# Patient Record
Sex: Female | Born: 1945 | Race: White | Hispanic: No | Smoking: Former smoker
Health system: Southern US, Community
[De-identification: ages and names within clinical notes are randomized; demographics above are authoritative.]

## PROBLEM LIST (undated history)

## (undated) DIAGNOSIS — Z9889 Other specified postprocedural states: Secondary | ICD-10-CM

## (undated) DIAGNOSIS — I10 Essential (primary) hypertension: Secondary | ICD-10-CM

## (undated) DIAGNOSIS — R519 Headache, unspecified: Secondary | ICD-10-CM

## (undated) DIAGNOSIS — R112 Nausea with vomiting, unspecified: Secondary | ICD-10-CM

## (undated) DIAGNOSIS — E785 Hyperlipidemia, unspecified: Secondary | ICD-10-CM

## (undated) DIAGNOSIS — N3642 Intrinsic sphincter deficiency (ISD): Secondary | ICD-10-CM

## (undated) DIAGNOSIS — K589 Irritable bowel syndrome without diarrhea: Secondary | ICD-10-CM

## (undated) DIAGNOSIS — E041 Nontoxic single thyroid nodule: Secondary | ICD-10-CM

## (undated) DIAGNOSIS — F329 Major depressive disorder, single episode, unspecified: Secondary | ICD-10-CM

## (undated) DIAGNOSIS — F32A Depression, unspecified: Secondary | ICD-10-CM

## (undated) DIAGNOSIS — F419 Anxiety disorder, unspecified: Secondary | ICD-10-CM

## (undated) DIAGNOSIS — M1990 Unspecified osteoarthritis, unspecified site: Secondary | ICD-10-CM

## (undated) DIAGNOSIS — R32 Unspecified urinary incontinence: Secondary | ICD-10-CM

## (undated) DIAGNOSIS — J189 Pneumonia, unspecified organism: Secondary | ICD-10-CM

## (undated) DIAGNOSIS — N3020 Other chronic cystitis without hematuria: Secondary | ICD-10-CM

## (undated) DIAGNOSIS — R51 Headache: Secondary | ICD-10-CM

## (undated) DIAGNOSIS — K648 Other hemorrhoids: Secondary | ICD-10-CM

## (undated) DIAGNOSIS — E559 Vitamin D deficiency, unspecified: Secondary | ICD-10-CM

## (undated) DIAGNOSIS — K644 Residual hemorrhoidal skin tags: Secondary | ICD-10-CM

## (undated) DIAGNOSIS — M8580 Other specified disorders of bone density and structure, unspecified site: Secondary | ICD-10-CM

## (undated) DIAGNOSIS — D510 Vitamin B12 deficiency anemia due to intrinsic factor deficiency: Secondary | ICD-10-CM

## (undated) DIAGNOSIS — K219 Gastro-esophageal reflux disease without esophagitis: Secondary | ICD-10-CM

## (undated) DIAGNOSIS — R011 Cardiac murmur, unspecified: Secondary | ICD-10-CM

## (undated) DIAGNOSIS — Z8719 Personal history of other diseases of the digestive system: Secondary | ICD-10-CM

## (undated) DIAGNOSIS — M5030 Other cervical disc degeneration, unspecified cervical region: Secondary | ICD-10-CM

## (undated) DIAGNOSIS — I872 Venous insufficiency (chronic) (peripheral): Secondary | ICD-10-CM

## (undated) DIAGNOSIS — M797 Fibromyalgia: Secondary | ICD-10-CM

## (undated) HISTORY — PX: DILATION AND CURETTAGE OF UTERUS: SHX78

## (undated) HISTORY — DX: Essential (primary) hypertension: I10

## (undated) HISTORY — DX: Nontoxic single thyroid nodule: E04.1

## (undated) HISTORY — DX: Cardiac murmur, unspecified: R01.1

## (undated) HISTORY — PX: CARDIAC CATHETERIZATION: SHX172

## (undated) HISTORY — DX: Other cervical disc degeneration, unspecified cervical region: M50.30

## (undated) HISTORY — DX: Vitamin D deficiency, unspecified: E55.9

## (undated) HISTORY — DX: Vitamin B12 deficiency anemia due to intrinsic factor deficiency: D51.0

## (undated) HISTORY — DX: Venous insufficiency (chronic) (peripheral): I87.2

## (undated) HISTORY — PX: TUBAL LIGATION: SHX77

## (undated) HISTORY — DX: Irritable bowel syndrome, unspecified: K58.9

## (undated) HISTORY — DX: Pneumonia, unspecified organism: J18.9

---

## 1982-10-04 HISTORY — PX: BILATERAL SALPINGOOPHORECTOMY: SHX1223

## 1982-10-04 HISTORY — PX: APPENDECTOMY: SHX54

## 1984-10-04 HISTORY — PX: TOTAL ABDOMINAL HYSTERECTOMY: SHX209

## 1997-10-04 HISTORY — PX: ANTERIOR CERVICAL DECOMP/DISCECTOMY FUSION: SHX1161

## 1998-07-31 ENCOUNTER — Emergency Department (HOSPITAL_COMMUNITY): Admission: EM | Admit: 1998-07-31 | Discharge: 1998-08-01 | Payer: Self-pay | Admitting: Emergency Medicine

## 2001-03-08 ENCOUNTER — Encounter: Payer: Self-pay | Admitting: Internal Medicine

## 2001-03-08 ENCOUNTER — Ambulatory Visit (HOSPITAL_COMMUNITY): Admission: RE | Admit: 2001-03-08 | Discharge: 2001-03-08 | Payer: Self-pay | Admitting: Internal Medicine

## 2002-05-18 ENCOUNTER — Ambulatory Visit (HOSPITAL_BASED_OUTPATIENT_CLINIC_OR_DEPARTMENT_OTHER): Admission: RE | Admit: 2002-05-18 | Discharge: 2002-05-18 | Payer: Self-pay | Admitting: *Deleted

## 2002-05-18 ENCOUNTER — Encounter (INDEPENDENT_AMBULATORY_CARE_PROVIDER_SITE_OTHER): Payer: Self-pay | Admitting: *Deleted

## 2002-05-18 HISTORY — PX: OTHER SURGICAL HISTORY: SHX169

## 2002-10-03 ENCOUNTER — Ambulatory Visit (HOSPITAL_COMMUNITY): Admission: RE | Admit: 2002-10-03 | Discharge: 2002-10-03 | Payer: Self-pay | Admitting: Cardiology

## 2002-10-03 ENCOUNTER — Encounter: Payer: Self-pay | Admitting: Cardiology

## 2002-11-09 ENCOUNTER — Ambulatory Visit (HOSPITAL_COMMUNITY): Admission: RE | Admit: 2002-11-09 | Discharge: 2002-11-09 | Payer: Self-pay | Admitting: Pulmonary Disease

## 2002-11-09 ENCOUNTER — Encounter: Payer: Self-pay | Admitting: Pulmonary Disease

## 2003-01-16 ENCOUNTER — Encounter: Payer: Self-pay | Admitting: Emergency Medicine

## 2003-01-16 ENCOUNTER — Emergency Department (HOSPITAL_COMMUNITY): Admission: EM | Admit: 2003-01-16 | Discharge: 2003-01-17 | Payer: Self-pay | Admitting: Emergency Medicine

## 2003-09-04 ENCOUNTER — Encounter: Admission: RE | Admit: 2003-09-04 | Discharge: 2003-09-04 | Payer: Self-pay | Admitting: Obstetrics and Gynecology

## 2004-04-29 ENCOUNTER — Inpatient Hospital Stay (HOSPITAL_COMMUNITY): Admission: RE | Admit: 2004-04-29 | Discharge: 2004-04-30 | Payer: Self-pay | Admitting: Obstetrics and Gynecology

## 2004-04-29 HISTORY — PX: ANTERIOR AND POSTERIOR VAGINAL REPAIR: SUR5

## 2004-08-17 ENCOUNTER — Ambulatory Visit: Payer: Self-pay | Admitting: Pulmonary Disease

## 2004-08-17 ENCOUNTER — Ambulatory Visit: Payer: Self-pay | Admitting: Internal Medicine

## 2004-08-24 ENCOUNTER — Ambulatory Visit (HOSPITAL_COMMUNITY): Admission: RE | Admit: 2004-08-24 | Discharge: 2004-08-24 | Payer: Self-pay | Admitting: Obstetrics and Gynecology

## 2004-10-12 ENCOUNTER — Ambulatory Visit: Payer: Self-pay | Admitting: Pulmonary Disease

## 2005-06-08 ENCOUNTER — Ambulatory Visit: Payer: Self-pay | Admitting: Pulmonary Disease

## 2005-06-16 ENCOUNTER — Ambulatory Visit: Payer: Self-pay | Admitting: Pulmonary Disease

## 2005-08-04 ENCOUNTER — Ambulatory Visit: Payer: Self-pay | Admitting: Pulmonary Disease

## 2005-10-21 ENCOUNTER — Ambulatory Visit: Payer: Self-pay | Admitting: Pulmonary Disease

## 2005-10-28 ENCOUNTER — Ambulatory Visit: Payer: Self-pay | Admitting: Adult Health

## 2006-02-07 ENCOUNTER — Ambulatory Visit: Payer: Self-pay | Admitting: Pulmonary Disease

## 2006-02-08 ENCOUNTER — Ambulatory Visit (HOSPITAL_COMMUNITY): Admission: RE | Admit: 2006-02-08 | Discharge: 2006-02-08 | Payer: Self-pay | Admitting: Pulmonary Disease

## 2006-02-24 ENCOUNTER — Ambulatory Visit: Payer: Self-pay | Admitting: Cardiology

## 2006-03-02 ENCOUNTER — Ambulatory Visit: Payer: Self-pay | Admitting: Internal Medicine

## 2006-03-04 ENCOUNTER — Ambulatory Visit: Payer: Self-pay | Admitting: Internal Medicine

## 2006-03-04 ENCOUNTER — Encounter (INDEPENDENT_AMBULATORY_CARE_PROVIDER_SITE_OTHER): Payer: Self-pay | Admitting: *Deleted

## 2006-03-04 LAB — HM COLONOSCOPY

## 2006-06-28 ENCOUNTER — Ambulatory Visit: Payer: Self-pay | Admitting: Pulmonary Disease

## 2006-06-28 ENCOUNTER — Ambulatory Visit (HOSPITAL_COMMUNITY): Admission: RE | Admit: 2006-06-28 | Discharge: 2006-06-28 | Payer: Self-pay | Admitting: Pulmonary Disease

## 2006-07-19 ENCOUNTER — Ambulatory Visit: Payer: Self-pay | Admitting: Pulmonary Disease

## 2006-12-20 ENCOUNTER — Ambulatory Visit: Payer: Self-pay | Admitting: Pulmonary Disease

## 2006-12-26 ENCOUNTER — Ambulatory Visit: Payer: Self-pay | Admitting: Pulmonary Disease

## 2006-12-26 LAB — CONVERTED CEMR LAB
AST: 25 units/L (ref 0–37)
Albumin: 3.8 g/dL (ref 3.5–5.2)
Basophils Relative: 0.3 % (ref 0.0–1.0)
Bilirubin, Direct: 0.1 mg/dL (ref 0.0–0.3)
Chloride: 106 meq/L (ref 96–112)
HCT: 39 % (ref 36.0–46.0)
Hemoglobin: 13.5 g/dL (ref 12.0–15.0)
Lymphocytes Relative: 26.4 % (ref 12.0–46.0)
Monocytes Absolute: 0.7 10*3/uL (ref 0.2–0.7)
Neutro Abs: 4.2 10*3/uL (ref 1.4–7.7)
Platelets: 310 10*3/uL (ref 150–400)
RBC: 4.38 M/uL (ref 3.87–5.11)
RDW: 12.5 % (ref 11.5–14.6)
Total Bilirubin: 0.6 mg/dL (ref 0.3–1.2)

## 2007-07-05 ENCOUNTER — Ambulatory Visit: Payer: Self-pay | Admitting: Pulmonary Disease

## 2007-08-07 ENCOUNTER — Encounter: Payer: Self-pay | Admitting: Pulmonary Disease

## 2007-08-14 ENCOUNTER — Ambulatory Visit: Payer: Self-pay | Admitting: Pulmonary Disease

## 2007-09-21 ENCOUNTER — Ambulatory Visit: Payer: Self-pay | Admitting: Pulmonary Disease

## 2007-11-01 ENCOUNTER — Ambulatory Visit: Payer: Self-pay | Admitting: Pulmonary Disease

## 2007-11-04 DIAGNOSIS — I10 Essential (primary) hypertension: Secondary | ICD-10-CM | POA: Insufficient documentation

## 2007-11-04 DIAGNOSIS — K2970 Gastritis, unspecified, without bleeding: Secondary | ICD-10-CM | POA: Insufficient documentation

## 2007-11-04 DIAGNOSIS — K589 Irritable bowel syndrome without diarrhea: Secondary | ICD-10-CM | POA: Insufficient documentation

## 2007-11-04 DIAGNOSIS — R0789 Other chest pain: Secondary | ICD-10-CM | POA: Insufficient documentation

## 2007-11-04 DIAGNOSIS — IMO0001 Reserved for inherently not codable concepts without codable children: Secondary | ICD-10-CM | POA: Insufficient documentation

## 2007-11-04 DIAGNOSIS — M542 Cervicalgia: Secondary | ICD-10-CM

## 2007-11-04 DIAGNOSIS — Z87898 Personal history of other specified conditions: Secondary | ICD-10-CM

## 2007-11-04 DIAGNOSIS — F330 Major depressive disorder, recurrent, mild: Secondary | ICD-10-CM | POA: Insufficient documentation

## 2007-11-04 DIAGNOSIS — K2990 Gastroduodenitis, unspecified, without bleeding: Secondary | ICD-10-CM

## 2007-11-04 DIAGNOSIS — F418 Other specified anxiety disorders: Secondary | ICD-10-CM

## 2007-11-04 DIAGNOSIS — E785 Hyperlipidemia, unspecified: Secondary | ICD-10-CM

## 2007-11-04 HISTORY — DX: Essential (primary) hypertension: I10

## 2007-11-04 HISTORY — DX: Other specified anxiety disorders: F41.8

## 2007-11-04 LAB — CONVERTED CEMR LAB
AST: 16 units/L (ref 0–37)
Alkaline Phosphatase: 77 units/L (ref 39–117)
BUN: 15 mg/dL (ref 6–23)
Basophils Relative: 0.6 % (ref 0.0–1.0)
CO2: 31 meq/L (ref 19–32)
Cholesterol: 156 mg/dL (ref 0–200)
Creatinine, Ser: 0.8 mg/dL (ref 0.4–1.2)
Eosinophils Relative: 1.4 % (ref 0.0–5.0)
HCT: 36.7 % (ref 36.0–46.0)
HDL: 38.7 mg/dL — ABNORMAL LOW (ref >39.0)
Hemoglobin: 12.1 g/dL (ref 12.0–15.0)
Lymphocytes Relative: 34 % (ref 12.0–46.0)
MCHC: 32.9 g/dL (ref 30.0–36.0)
Monocytes Absolute: 0.4 10*3/uL (ref 0.2–0.7)
Monocytes Relative: 6.7 % (ref 3.0–11.0)
Neutrophils Relative %: 57.3 % (ref 43.0–77.0)
Platelets: 312 10*3/uL (ref 150–400)
Sodium: 141 meq/L (ref 135–145)
Total CHOL/HDL Ratio: 4
VLDL: 27 mg/dL (ref 0–40)
WBC: 6.6 10*3/uL (ref 4.5–10.5)

## 2007-11-21 ENCOUNTER — Telehealth (INDEPENDENT_AMBULATORY_CARE_PROVIDER_SITE_OTHER): Payer: Self-pay | Admitting: *Deleted

## 2007-12-25 ENCOUNTER — Telehealth (INDEPENDENT_AMBULATORY_CARE_PROVIDER_SITE_OTHER): Payer: Self-pay | Admitting: *Deleted

## 2008-01-02 ENCOUNTER — Ambulatory Visit: Payer: Self-pay | Admitting: Pulmonary Disease

## 2008-02-07 ENCOUNTER — Ambulatory Visit: Payer: Self-pay | Admitting: Pulmonary Disease

## 2008-02-13 ENCOUNTER — Encounter: Payer: Self-pay | Admitting: Pulmonary Disease

## 2008-03-29 ENCOUNTER — Ambulatory Visit: Payer: Self-pay | Admitting: Pulmonary Disease

## 2008-06-04 ENCOUNTER — Ambulatory Visit: Payer: Self-pay | Admitting: Pulmonary Disease

## 2008-07-11 ENCOUNTER — Telehealth: Payer: Self-pay | Admitting: Adult Health

## 2008-07-16 ENCOUNTER — Ambulatory Visit: Payer: Self-pay | Admitting: Pulmonary Disease

## 2008-07-20 DIAGNOSIS — E559 Vitamin D deficiency, unspecified: Secondary | ICD-10-CM | POA: Insufficient documentation

## 2008-07-20 HISTORY — DX: Vitamin D deficiency, unspecified: E55.9

## 2008-07-23 ENCOUNTER — Ambulatory Visit: Payer: Self-pay | Admitting: Pulmonary Disease

## 2008-07-23 LAB — CONVERTED CEMR LAB
ALT: 18 units/L (ref 0–35)
BUN: 16 mg/dL (ref 6–23)
Basophils Relative: 0.6 % (ref 0.0–3.0)
Bilirubin, Direct: 0.2 mg/dL (ref 0.0–0.3)
CO2: 30 meq/L (ref 19–32)
Chloride: 105 meq/L (ref 96–112)
Cholesterol: 128 mg/dL (ref 0–200)
Creatinine, Ser: 0.8 mg/dL (ref 0.4–1.2)
Eosinophils Absolute: 0.1 10*3/uL (ref 0.0–0.7)
Eosinophils Relative: 1.4 % (ref 0.0–5.0)
GFR calc Af Amer: 94 mL/min
HCT: 35.3 % — ABNORMAL LOW (ref 36.0–46.0)
Hemoglobin: 12.3 g/dL (ref 12.0–15.0)
Lymphocytes Relative: 33.5 % (ref 12.0–46.0)
MCHC: 34.9 g/dL (ref 30.0–36.0)
MCV: 88.8 fL (ref 78.0–100.0)
Monocytes Absolute: 0.4 10*3/uL (ref 0.1–1.0)
Neutro Abs: 3.8 10*3/uL (ref 1.4–7.7)
Neutrophils Relative %: 57.9 % (ref 43.0–77.0)
Potassium: 4.3 meq/L (ref 3.5–5.1)
RBC: 3.98 M/uL (ref 3.87–5.11)
RDW: 12.8 % (ref 11.5–14.6)
Total CHOL/HDL Ratio: 3.7

## 2008-07-25 ENCOUNTER — Telehealth (INDEPENDENT_AMBULATORY_CARE_PROVIDER_SITE_OTHER): Payer: Self-pay | Admitting: *Deleted

## 2008-08-08 ENCOUNTER — Encounter: Payer: Self-pay | Admitting: Pulmonary Disease

## 2008-10-11 ENCOUNTER — Ambulatory Visit: Payer: Self-pay | Admitting: Internal Medicine

## 2008-10-11 ENCOUNTER — Ambulatory Visit: Payer: Self-pay | Admitting: Pulmonary Disease

## 2009-01-09 ENCOUNTER — Ambulatory Visit: Payer: Self-pay | Admitting: Pulmonary Disease

## 2009-04-04 ENCOUNTER — Ambulatory Visit: Payer: Self-pay | Admitting: Internal Medicine

## 2009-07-24 ENCOUNTER — Telehealth: Payer: Self-pay | Admitting: Pulmonary Disease

## 2009-07-29 ENCOUNTER — Telehealth: Payer: Self-pay | Admitting: Pulmonary Disease

## 2009-07-30 ENCOUNTER — Ambulatory Visit: Payer: Self-pay | Admitting: Pulmonary Disease

## 2009-07-31 ENCOUNTER — Ambulatory Visit: Payer: Self-pay | Admitting: Pulmonary Disease

## 2009-07-31 LAB — CONVERTED CEMR LAB
ALT: 18 units/L (ref 0–35)
AST: 15 units/L (ref 0–37)
Alkaline Phosphatase: 70 units/L (ref 39–117)
BUN: 30 mg/dL — ABNORMAL HIGH (ref 6–23)
Basophils Absolute: 0.1 10*3/uL (ref 0.0–0.1)
Basophils Relative: 1.2 % (ref 0.0–3.0)
Bilirubin, Direct: 0 mg/dL (ref 0.0–0.3)
CO2: 32 meq/L (ref 19–32)
Calcium: 9.5 mg/dL (ref 8.4–10.5)
Chloride: 106 meq/L (ref 96–112)
Eosinophils Relative: 2.5 % (ref 0.0–5.0)
Glucose, Bld: 89 mg/dL (ref 70–99)
Hemoglobin: 12.3 g/dL (ref 12.0–15.0)
LDL Cholesterol: 70 mg/dL (ref 0–99)
Lymphs Abs: 2.6 10*3/uL (ref 0.7–4.0)
MCHC: 34.7 g/dL (ref 30.0–36.0)
Potassium: 4.2 meq/L (ref 3.5–5.1)
RDW: 12.5 % (ref 11.5–14.6)
Sodium: 145 meq/L (ref 135–145)
Total Bilirubin: 0.6 mg/dL (ref 0.3–1.2)
Total Protein: 7 g/dL (ref 6.0–8.3)
WBC: 6.9 10*3/uL (ref 4.5–10.5)

## 2009-12-05 ENCOUNTER — Telehealth (INDEPENDENT_AMBULATORY_CARE_PROVIDER_SITE_OTHER): Payer: Self-pay | Admitting: *Deleted

## 2010-01-02 ENCOUNTER — Ambulatory Visit: Payer: Self-pay | Admitting: Pulmonary Disease

## 2010-03-19 ENCOUNTER — Encounter: Payer: Self-pay | Admitting: Pulmonary Disease

## 2010-03-25 ENCOUNTER — Telehealth: Payer: Self-pay | Admitting: Pulmonary Disease

## 2010-03-27 ENCOUNTER — Encounter: Payer: Self-pay | Admitting: Pulmonary Disease

## 2010-05-28 ENCOUNTER — Telehealth: Payer: Self-pay | Admitting: Pulmonary Disease

## 2010-05-28 ENCOUNTER — Encounter: Payer: Self-pay | Admitting: Pulmonary Disease

## 2010-05-29 ENCOUNTER — Encounter: Payer: Self-pay | Admitting: Pulmonary Disease

## 2010-06-18 ENCOUNTER — Telehealth (INDEPENDENT_AMBULATORY_CARE_PROVIDER_SITE_OTHER): Payer: Self-pay | Admitting: *Deleted

## 2010-06-19 ENCOUNTER — Encounter: Payer: Self-pay | Admitting: Pulmonary Disease

## 2010-06-23 ENCOUNTER — Ambulatory Visit: Payer: Self-pay | Admitting: Pulmonary Disease

## 2010-08-07 ENCOUNTER — Telehealth: Payer: Self-pay | Admitting: Pulmonary Disease

## 2010-08-20 ENCOUNTER — Telehealth: Payer: Self-pay | Admitting: Pulmonary Disease

## 2010-10-01 ENCOUNTER — Telehealth: Payer: Self-pay | Admitting: Pulmonary Disease

## 2010-10-12 ENCOUNTER — Ambulatory Visit: Admit: 2010-10-12 | Payer: Self-pay | Admitting: Pulmonary Disease

## 2010-10-19 ENCOUNTER — Telehealth (INDEPENDENT_AMBULATORY_CARE_PROVIDER_SITE_OTHER): Payer: Self-pay | Admitting: *Deleted

## 2010-10-26 ENCOUNTER — Telehealth (INDEPENDENT_AMBULATORY_CARE_PROVIDER_SITE_OTHER): Payer: Self-pay | Admitting: *Deleted

## 2010-11-02 ENCOUNTER — Telehealth: Payer: Self-pay | Admitting: Pulmonary Disease

## 2010-11-05 NOTE — Letter (Signed)
[facility]   Imported By: Sherian Rein 07/07/2010 12:08:10  _____________________________________________________________________  External Attachment:    Type:   Image     Comment:   External Document

## 2010-11-05 NOTE — Progress Notes (Signed)
Summary: alprazolam rx  Phone Note Call from Patient Call back at Work Phone [phone]   Caller: Patient Call For: nadel Reason for Call: Refill Medication Summary of Call: Patient needing refill--alprazolam 0.5mg ---[facility] # [phone] Initial call taken by: Lehman Prom,  October 19, 2010 9:12 AM  Follow-up for Phone Call        Rx last called in on 07/01/10 #100 x 0 with instructions pt with need ov for further refills of this med.  Last OV with TP 06/23/10 Last OV with SN 07/31/2009 Pending OV with SN 11/16/10  Dr. Kriste Basque, pls advise if rx ok.  Thanks! Follow-up by: Gweneth Dimitri RN,  October 19, 2010 10:34 AM  Additional Follow-up for Phone Call Additional follow up Details #1::        per SN----ok to refill alprazolam   #100   1/2 to 1 tablet by mouth three times a day as needed for nerves with no refills---keep appt with SN on 2-13 for refills of meds. thanks Randell Loop CMA  October 19, 2010 10:50 AM    Rx called into [facility] in [place].  Pt aware of this and reminded to pls keep appt for 2/13 for addional rxs.  She verbalized understanding of this. Additional Follow-up by: Gweneth Dimitri RN,  October 19, 2010 11:20 AM    Prescriptions: ALPRAZOLAM 0.5 MG  TABS (ALPRAZOLAM) 1/2 to 1 tab by mouth three times a day as needed for nreves...  #100 x 0   Entered by:   Gweneth Dimitri RN   Authorized by:   Michele Mcalpine MD   Signed by:   Gweneth Dimitri RN on 10/19/2010   Method used:   Telephoned to ...       [facility] (retail)       [address]       [place], [place]    [place]       Ph: [phone]       Fax: [phone]   RxID:   [id number]

## 2010-11-05 NOTE — Progress Notes (Signed)
Summary: b-12  Phone Note Call from Patient Call back at Home Phone [phone]   Caller: Patient Call For: Americus Perkey Summary of Call: pt needs rx written to take to the pharmacy to [facility] just needs to written like its being filled with directions and signed with refills pt request to give rx to her daughter chantel   Initial call taken by: Lacinda Axon,  March 25, 2010 2:56 PM  Follow-up for Phone Call        SN---do you want her to cont the b12 injections every month??  if so i can print out the rx for this for her to [facility] Randell Loop CMA  March 25, 2010 2:59 PM   Additional Follow-up for Phone Call Additional follow up Details #1::        rx has been printed out and given to chan--pts daughter per pts request Randell Loop [facility]  March 25, 2010 4:44 PM     New/Updated Medications: CYANOCOBALAMIN 1000 MCG/ML SOLN (CYANOCOBALAMIN) subcutaneous once monthly Prescriptions: CYANOCOBALAMIN 1000 MCG/ML SOLN (CYANOCOBALAMIN) subcutaneous once monthly  #1 vial x 6   Entered by:   Randell Loop CMA   Authorized by:   Michele Mcalpine MD   Signed by:   Randell Loop CMA on 03/25/2010   Method used:   Print then Give to Patient   RxID:   [id number]   Appended Document: [facility]ap [facility] updated     Clinical Lists Changes  Observations: Added new observation of TD BOOSTER: Tdap (03/19/2010 10:55)       Immunization History:  Tetanus/Td Immunization History:    Tetanus/Td:  tdap (03/19/2010)

## 2010-11-05 NOTE — Letter (Signed)
[facility]   Imported By: Sherian Rein 06/15/2010 11:54:28  _____________________________________________________________________  External Attachment:    Type:   Image     Comment:   External Document

## 2010-11-05 NOTE — Letter (Signed)
Summary: External Correspondence  External Correspondence   Imported By: Valinda Hoar 05/28/2010 10:27:34  _____________________________________________________________________  External Attachment:    Type:   Image     Comment:   External Document

## 2010-11-05 NOTE — Progress Notes (Signed)
Summary: REFILL  Phone Note Call from Patient Call back at Home Phone [phone]   Caller: Patient Call For: Damaria Stofko Summary of Call: GENERIC [facility] NEEDS REFILLS TO LAST UNTIL APPT IN JAN SENT TO [facility] IN [place] Initial call taken by: Lacinda Axon,  August 20, 2010 10:19 AM  Follow-up for Phone Call        rx for the sertraline has been sent to pts pharmacy per her request and pt has appt in jan with SN Randell Loop CMA  August 20, 2010 10:47 AM

## 2010-11-05 NOTE — Assessment & Plan Note (Signed)
Summary: Acute NP office visit - sinus inf   CC:  sore throat, hoarseness, sinus pressure/congestion, PND and prod cough with clear mucus, and chills/sweats x1week.  History of Present Illness: This is a pleasant, 65 year old, white female patient has a known history hypertension, hyperlipidemia, pernicious anemia.     ~  Oct09:  she has mult somatic complaints and is under alot of stress w/ recent separation... 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  she tried Lyrica for her fibromyalgia and noted that 2/d was too much and made her "loopey" so she decreased it back to 1/d... she also notes a rash under her breast and we Rx'd w/ Lotrisone...   ~  July 31, 2009:  she reports that she was laid off from [employer] where she worked for yrs- and now has no insurance... we discussed mult changes to save $$$... she has done a fabulous job w/ diet + exercise & lost 36# this yr down to 168# today...   June 23, 2010--Presents for an acute office visit. Complains of sore throat, hoarseness, sinus pressure/congestion, PND and prod cough with clear mucus, chills/sweats x1week. Took otc cold meds.  She has been laid off work 2009 and does not have insurance. Currently working in daycare as substitue. [facility], given some throat gargle. Symptoms are no better. Denies chest pain, dyspnea, orthopnea, hemoptysis, fever, n/v/d, edema, headache,recent travel or antibiotics.  Quit smoking 11/10.        Preventive Screening-Counseling & Management  Alcohol-Tobacco     Smoking Status: quit  Medications Prior to Update: 1)  Atenolol 25 Mg Tabs (Atenolol) .... Take 1 Tab By Mouth Once Daily.Marland KitchenMarland Kitchen 2)  Losartan Potassium-Hctz 50-12.5 Mg Tabs (Losartan Potassium-Hctz) .... Take 1 Tab By Mouth Once Daily.Marland KitchenMarland Kitchen 3)  Pravastatin Sodium 40 Mg Tabs (Pravastatin Sodium) .... Take 1 Tab By Mouth At  Bedtime.Marland KitchenMarland Kitchen 4)  Prilosec 20 Mg  Cpdr (Omeprazole) .Marland Kitchen.. 1 Tab Once Daily - Taken 30 Min Before A Meal... 5)  Tramadol Hcl 50 Mg  Tabs (Tramadol Hcl) .Marland Kitchen.. 1 Tab By Mouth Q6h As Needed For Pain... 6)  Sertraline Hcl 100 Mg Tabs (Sertraline Hcl) .... Take 1 & 1/2 Tabs By Mouth Once Daily.Marland KitchenMarland Kitchen 7)  Alprazolam 0.5 Mg  Tabs (Alprazolam) .... 1/2 To 1 Tab By Mouth Three Times A Day As Needed For Nreves... 8)  Vitamin D 1000 Unit Tabs (Cholecalciferol) .... Take 1 Tab By Mouth Once Daily.Marland KitchenMarland Kitchen 9)  Cyanocobalamin 1000 Mcg/ml Soln (Cyanocobalamin) .Marland Kitchen.. Subcutaneous Once Monthly  Current Medications (verified): 1)  Atenolol 25 Mg Tabs (Atenolol) .... Take 1 Tab By Mouth Once Daily.Marland KitchenMarland Kitchen 2)  Losartan Potassium-Hctz 50-12.5 Mg Tabs (Losartan Potassium-Hctz) .... Take 1 Tab By Mouth Once Daily.Marland KitchenMarland Kitchen 3)  Pravastatin Sodium 40 Mg Tabs (Pravastatin Sodium) .... Take 1 Tab By Mouth At Bedtime.Marland KitchenMarland Kitchen 4)  Prilosec 20 Mg  Cpdr (Omeprazole) .Marland Kitchen.. 1 Tab Once Daily - Taken 30 Min Before A Meal... 5)  Tramadol Hcl 50 Mg  Tabs (Tramadol Hcl) .Marland Kitchen.. 1 Tab By Mouth Q6h As Needed For Pain... 6)  Sertraline Hcl 100 Mg Tabs (Sertraline Hcl) .... Take 1 & 1/2 Tabs By Mouth Once Daily.Marland KitchenMarland Kitchen 7)  Alprazolam 0.5 Mg  Tabs (Alprazolam) .... 1/2 To 1 Tab By Mouth Three Times A Day As Needed For Nreves... 8)  Vitamin D 1000 Unit Tabs (Cholecalciferol) .... Take 1 Tab By Mouth Once Daily.Marland KitchenMarland Kitchen 9)  Cyanocobalamin 1000 Mcg/ml Soln (Cyanocobalamin) .Marland Kitchen.. Subcutaneous Once Monthly  Allergies (verified): 1)  ! Sulfa 2)  ! Morphine  Past History:  Past Medical History: Last updated: 07/31/2009 CIGARETTE SMOKER (ICD-305.1) HYPERTENSION (ICD-401.9) Hx of CHEST PAIN, ATYPICAL (ICD-786.59) HYPERLIPIDEMIA (ICD-272.4) Hx of GASTRITIS (ICD-535.50) IRRITABLE BOWEL SYNDROME (ICD-564.1) Hx of NECK PAIN (ICD-723.1) FIBROMYALGIA (ICD-729.1) VITAMIN D DEFICIENCY (ICD-268.9) MIGRAINES, HX OF (ICD-V13.8) ANXIETY (ICD-300.00) PERNICIOUS ANEMIA  (ICD-281.0)  Past Surgical History: Last updated: 07/31/2009 S/P Neck Surgery by DrStern in 1999 S/P TAH & Bladder suspension 7/05  Family History: Last updated: Aug 06, 2008 Father died age 17 w/ lung cancer, CAD w/ MI, smoker Mother died age 10 w/ myelodysplaia, HBP, CHF 4 Siblings: 2 Bro & 2 Sis-  one sis died MVA, other sis has fibromyalgia, bro HBP & anemia  Social History: Last updated: 06/23/2010 former smoker, quit 08-09-09 - x63yrs, <1ppd occ alcohol divorced 1 child, works for [employer] retired, 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  Risk Factors: Smoking Status: quit (06/23/2010)  Social History: former smoker, quit 08-09-09 - x56yrs, <1ppd occ alcohol divorced 1 child, works for [employer] retired, 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Smoking Status:  quit  Review of Systems      See HPI  Vital Signs:  Patient profile:   65 year old female Height:      66 inches Weight:      192.38 pounds BMI:     31.16 O2 Sat:      93 % on Room air Temp:     98.1 degrees F oral Pulse rate:   79 / minute BP sitting:   120 / 60  (right arm) Cuff size:   regular  Vitals Entered By: Boone Master CNA/MA (June 23, 2010 9:47 AM)  O2 Flow:  Room air CC: sore throat, hoarseness, sinus pressure/congestion, PND and prod cough with clear mucus, chills/sweats x1week Is Patient Diabetic? No Comments Medications reviewed with patient Daytime contact number verified with patient. Boone Master CNA/MA  June 23, 2010 9:46 AM    Physical Exam  Additional Exam:  WD, Overweight, 65 y/o WF in NAD... GENERAL:  Alert & oriented; pleasant & cooperative... HEENT:  [place]/AT, EOM-wnl, PERRLA, EACs-clear, TMs-wnl, NOSE-clear, THROAT-clear & wnl. NECK:  Scar of prev surg w/ fairROM; no JVD; normal carotid impulses w/o bruits; no thyromegaly or nodules palpated; no lymphadenopathy. CHEST:  Clear to P & A; without wheezes/ rales/ or rhonchi. HEART:  Regular Rhythm; without murmurs/ rubs/ or  gallops. ABDOMEN:  Soft & nontender; normal bowel sounds; no organomegaly or masses detected. EXT: without deformities, mild arthritic changes; no varicose veins/ +venous insuffic/ no edema. NEURO:  CN's intact; motor testing normal; sensory testing normal; gait normal & balance OK. DERM:  No lesions noted; intertriginous rash under breast c/w skin fungus...     Impression & Recommendations:  Problem # 1:  UPPER RESPIRATORY INFECTION, ACUTE (ICD-465.9)  Spectracef 400mg  two times a day until sample gone Nasonex 2 puffs two times a day until gone Saline nasal rinses as needed  Change Losartan to Lisinopril 10/12.5 once daily --finish losartan first.  Please contact office for sooner follow up if symptoms do not improve or worsen   Orders: Est. Patient Level I (52841)  Medications Added to Medication List This Visit: 1)  Prinzide 10-12.5 Mg Tabs (Lisinopril-hydrochlorothiazide) .Marland Kitchen.. 1 by mouth once daily  Complete Medication List: 1)  Atenolol 25 Mg Tabs (Atenolol) .... Take 1 tab by mouth once daily.Marland KitchenMarland Kitchen 2)  Prinzide 10-12.5 Mg Tabs (Lisinopril-hydrochlorothiazide) .Marland KitchenMarland KitchenMarland Kitchen 1  by mouth once daily 3)  Pravastatin Sodium 40 Mg Tabs (Pravastatin sodium) .... Take 1 tab by mouth at bedtime.Marland KitchenMarland Kitchen 4)  Prilosec 20 Mg Cpdr (Omeprazole) .Marland Kitchen.. 1 tab once daily - taken 30 min before a meal... 5)  Tramadol Hcl 50 Mg Tabs (Tramadol hcl) .Marland Kitchen.. 1 tab by mouth q6h as needed for pain.Marland KitchenMarland Kitchen 6)  Sertraline Hcl 100 Mg Tabs (Sertraline hcl) .... Take 1 & 1/2 tabs by mouth once daily.Marland KitchenMarland Kitchen 7)  Alprazolam 0.5 Mg Tabs (Alprazolam) .... 1/2 to 1 tab by mouth three times a day as needed for nreves... 8)  Vitamin D 1000 Unit Tabs (Cholecalciferol) .... Take 1 tab by mouth once daily.Marland KitchenMarland Kitchen 9)  Cyanocobalamin 1000 Mcg/ml Soln (Cyanocobalamin) .Marland Kitchen.. subcutaneous once monthly  Patient Instructions: 1)  Spectracef 400mg  two times a day until sample gone 2)  Nasonex 2 puffs two times a day until gone 3)  Saline nasal rinses  as needed  4)  Change Losartan to Lisinopril 10/12.5 once daily --finish losartan first.  5)  Please contact office for sooner follow up if symptoms do not improve or worsen  Prescriptions: PRINZIDE 10-12.5 MG TABS (LISINOPRIL-HYDROCHLOROTHIAZIDE) 1 by mouth once daily  #30 x 11   Entered and Authorized by:   Rubye Oaks NP   Signed by:   Rubye Oaks NP on 06/23/2010   Method used:   Electronically to        [facility] [phone]* (retail)       [address] [place], [place]  [address]       Ph: [phone]       Fax: [phone]   RxID:   [id number]    Immunization History:  Influenza Immunization History:    Influenza:  historical (07/04/2009)  Pneumovax Immunization History:    Pneumovax:  historical (10/04/2006)

## 2010-11-05 NOTE — Progress Notes (Signed)
Summary: xanax refill-LMTCB-pt returned call  Phone Note Call from Patient Call back at [phone]   Caller: Patient Call For: nadel Summary of Call: [phone] Kathryne Sharper last time she filled her alprazolam 0.5mg  at [facility] and it was going to cost her 90 some dollars and she has no health insurance Initial call taken by: Lacinda Axon,  December 05, 2009 1:42 PM  Follow-up for Phone Call        does pt need refill? LMTCB. Carron Curie CMA  December 05, 2009 3:32 PM  pt called back. "yes" she does need a refill. Tivis Ringer, CNA  December 05, 2009 3:40 PM   Additional Follow-up for Phone Call Additional follow up Details #1::        called medication to the pharmacy pt aware Additional Follow-up by: Lacinda Axon,  December 05, 2009 4:10 PM

## 2010-11-05 NOTE — Progress Notes (Signed)
Summary: pt needs refill on tramadol  Phone Note Call from Patient Call back at Home Phone [phone]   Caller: Patient Call For: nadel Summary of Call: pt [facility]ol [facility]  [facility] [address] [place] Initial call taken by: Lacinda Axon,  August 07, 2010 11:02 AM  Follow-up for Phone Call        pt last seen by SN on 07-31-09 and is requestign a refill on tramadol. Pt has no pending appts. Last Rx fro tramadol was given on 07-31-09 Please advise if ok to refill.Carron Curie CMA  August 07, 2010 11:35 AM   Additional Follow-up for Phone Call Additional follow up Details #1::        pt can have 1 refill but must schedule an appt with SN for any other refills. thanks Randell Loop CMA  August 07, 2010 11:37 AM   pt advised and appt set to see SN on 10-12-10 at 12 noon. Pt staets she ahs not been in for appt because she does not have any insurance.  I advised this can be worked out and she needs to keep appt. Refill sent.Carron Curie CMA  August 07, 2010 11:47 AM     Prescriptions: TRAMADOL HCL 50 MG  TABS (TRAMADOL HCL) 1 tab by mouth q6H as needed for pain...  #100 x 0   Entered by:   Carron Curie CMA   Authorized by:   Michele Mcalpine MD   Signed by:   Carron Curie CMA on 08/07/2010   Method used:   Electronically to        [facility]* (retail)       [address] [place], [place]  [address]       Ph: [phone]       Fax: [phone]   RxID:   [id number]

## 2010-11-05 NOTE — Assessment & Plan Note (Signed)
Summary: B12 SHOT/CB   Nurse Visit   Allergies: 1)  ! Sulfa 2)  ! Morphine  Medication Administration  Injection # 1:    Medication: Vit B12 1000 mcg    Diagnosis: PERNICIOUS ANEMIA (ICD-281.0)    Route: SQ    Site: L deltoid    Exp Date: 06/07/2011    Lot #: [id number]    Mfr: [employer]    Comments: b-12 shot pt tolerated shot    Patient tolerated injection without complications    Given by: susanne ford in allergy lab  Orders Added: 1)  Vit B12 1000 mcg [J3420] 2)  Admin of Therapeutic Inj  intramuscular or subcutaneous [13244]

## 2010-11-05 NOTE — Progress Notes (Signed)
Summary: refill request  Phone Note Call from Patient Call back at Home Phone [phone]   Caller: Patient Call For: nadel Summary of Call: pt requests refill of PRAVASTATIN SODIUM . (she has appt w/ sn for 10/12/10). [facility] [place] Initial call taken by: Tivis Ringer, CNA,  October 01, 2010 11:38 AM  Follow-up for Phone Call        refiil sent. pt aware.Carron Curie CMA  October 01, 2010 12:18 PM     Prescriptions: PRAVASTATIN SODIUM 40 MG TABS (PRAVASTATIN SODIUM) take 1 tab by mouth at bedtime...  #30 x 0   Entered by:   Carron Curie CMA   Authorized by:   Michele Mcalpine MD   Signed by:   Carron Curie CMA on 10/01/2010   Method used:   Electronically to        [facility] [phone]* (retail)       [address] [place], [place]  [address]       Ph: [phone]       Fax: [phone]   RxID:   [id number]

## 2010-11-05 NOTE — Progress Notes (Signed)
Summary: allergies - requesting OTC med  Phone Note Call from Patient Call back at Home Phone [phone]   Caller: Patient Call For: nadel Summary of Call: pt has sore throat drainage and issues swallowing will benedryl help with this needs some ideas pt doesnt have money to pay for meds Initial call taken by: Lacinda Axon,  June 18, 2010 10:31 AM  Follow-up for Phone Call        Spoke with pt.  She c/o "allergies" off and on x 1 wk.  sore throat, PND, occ sneezing, slight runny nose with clear drainage.  Denies cough, f/c/s.  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 Kriste Basque, pls advise.  Thanks! Follow-up by: Gweneth Dimitri RN,  June 18, 2010 10:44 AM  Additional Follow-up for Phone Call Additional follow up Details #1::        per SN---ok for her to use otc zyrtec once daily and use the saline  nasal spray as needed .  for her throat use chloraseptic spray and lozenges.  thanks Randell Loop CMA  June 18, 2010 12:26 PM   Called, spoke with pt.  Pt informed of above recs per SN and advised if sxs do no improve or worsen to call back.  She verbalized understanding. Additional Follow-up by: Gweneth Dimitri RN,  June 18, 2010 12:31 PM

## 2010-11-05 NOTE — Progress Notes (Signed)
Summary: sertraline refill > done  Phone Note Call from Patient Call back at Work Phone [phone]   Caller: Patient Call For: nadel Reason for Call: Refill Medication Summary of Call: Requests refill for sertraline hcl 100mg .//[facility] [place] Initial call taken by: Darletta Moll,  October 26, 2010 9:23 AM  Follow-up for Phone Call        last ov w/ TP 06/2010, has upoming appt with SN 2.13.2012.    [facility] refill request by Leigh this morning.  LMOM to notify pt of this. Paden Kuras CNA/MA  October 26, 2010 12:00 PM

## 2010-11-05 NOTE — Letter (Signed)
[facility]   Imported By: Sherian Rein 04/08/2010 07:41:18  _____________________________________________________________________  External Attachment:    Type:   Image     Comment:   External Document

## 2010-11-05 NOTE — Progress Notes (Signed)
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  would like this completed today if possible.  ontact daughter or patient when form is ready. Initial call taken by: Boone Master CNA/MA,  May 28, 2010 9:14 AM  Follow-up for Phone Call        form has been filled out and given to chantel---pts daughter Randell Loop [facility]  May 28, 2010 9:27 AM      Appended Document: form to be filled out pt aware and will pick up

## 2010-11-05 NOTE — Letter (Signed)
[facility]   Imported By: Sherian Rein 04/15/2010 13:57:10  _____________________________________________________________________  External Attachment:    Type:   Image     Comment:   External Document

## 2010-11-11 NOTE — Progress Notes (Signed)
Summary: NEEDS REFILL ****CALL TO [facility] *****  Phone Note Call from Patient Call back at Work Phone [phone]   Caller: Patient Call For: Jahfari Ambers Summary of Call: PRAVASTATION PT NEEDS REFILL TO LAST UNTIL APPT WITH Dylan Monforte CALL IN TO [facility]  [phone] Initial call taken by: Lacinda Axon,  November 02, 2010 9:22 AM  Follow-up for Phone Call        refill sent. pt aware.  Carron Curie CMA  November 02, 2010 9:51 AM     Prescriptions: PRAVASTATIN SODIUM 40 MG TABS (PRAVASTATIN SODIUM) take 1 tab by mouth at bedtime...  #30 Each x 0   Entered by:   Carron Curie CMA   Authorized by:   Michele Mcalpine MD   Signed by:   Carron Curie CMA on 11/02/2010   Method used:   Electronically to        [facility] [address] (retail)       [address]       [place], [place]  [address]       Ph: [phone]       Fax: [phone]   RxID:   [id number]

## 2010-11-16 ENCOUNTER — Other Ambulatory Visit: Payer: Self-pay | Admitting: Pulmonary Disease

## 2010-11-16 ENCOUNTER — Encounter: Payer: Self-pay | Admitting: Pulmonary Disease

## 2010-11-16 ENCOUNTER — Other Ambulatory Visit: Payer: Self-pay

## 2010-11-16 ENCOUNTER — Ambulatory Visit (INDEPENDENT_AMBULATORY_CARE_PROVIDER_SITE_OTHER): Payer: Self-pay | Admitting: Pulmonary Disease

## 2010-11-16 DIAGNOSIS — K589 Irritable bowel syndrome without diarrhea: Secondary | ICD-10-CM

## 2010-11-16 DIAGNOSIS — I872 Venous insufficiency (chronic) (peripheral): Secondary | ICD-10-CM | POA: Insufficient documentation

## 2010-11-16 DIAGNOSIS — E559 Vitamin D deficiency, unspecified: Secondary | ICD-10-CM

## 2010-11-16 DIAGNOSIS — D510 Vitamin B12 deficiency anemia due to intrinsic factor deficiency: Secondary | ICD-10-CM

## 2010-11-16 DIAGNOSIS — IMO0001 Reserved for inherently not codable concepts without codable children: Secondary | ICD-10-CM

## 2010-11-16 DIAGNOSIS — E785 Hyperlipidemia, unspecified: Secondary | ICD-10-CM

## 2010-11-16 DIAGNOSIS — M542 Cervicalgia: Secondary | ICD-10-CM

## 2010-11-16 DIAGNOSIS — K2990 Gastroduodenitis, unspecified, without bleeding: Secondary | ICD-10-CM

## 2010-11-16 DIAGNOSIS — R0789 Other chest pain: Secondary | ICD-10-CM

## 2010-11-16 DIAGNOSIS — I10 Essential (primary) hypertension: Secondary | ICD-10-CM

## 2010-11-16 LAB — HEPATIC FUNCTION PANEL
AST: 16 U/L (ref 0–37)
Albumin: 4.3 g/dL (ref 3.5–5.2)
Total Bilirubin: 0.7 mg/dL (ref 0.3–1.2)

## 2010-11-16 LAB — CBC WITH DIFFERENTIAL/PLATELET
Basophils Relative: 0.5 % (ref 0.0–3.0)
Eosinophils Absolute: 0.2 10*3/uL (ref 0.0–0.7)
HCT: 37.3 % (ref 36.0–46.0)
Hemoglobin: 12.5 g/dL (ref 12.0–15.0)
Monocytes Absolute: 0.6 10*3/uL (ref 0.1–1.0)

## 2010-11-16 LAB — BASIC METABOLIC PANEL
BUN: 22 mg/dL (ref 6–23)
CO2: 29 mEq/L (ref 19–32)
Chloride: 99 mEq/L (ref 96–112)
Glucose, Bld: 89 mg/dL (ref 70–99)
Potassium: 4.6 mEq/L (ref 3.5–5.1)

## 2010-11-16 LAB — LIPID PANEL
HDL: 47.5 mg/dL (ref >39.00)
LDL Cholesterol: 92 mg/dL (ref 0–99)
Total CHOL/HDL Ratio: 4

## 2010-11-20 ENCOUNTER — Encounter: Payer: Self-pay | Admitting: Pulmonary Disease

## 2010-11-25 NOTE — Assessment & Plan Note (Signed)
Summary: 1 year follow up/jrc   CC:  16 month ROV & review of mult medical problems....  History of Present Illness: 65 y/o WF ([name]) here for a follow up visit and review of her medical issues...    ~  Oct09:  she has mult somatic complaints and is under alot of stress w/ recent separation... she notes some recent eye problems- steming from prev radial-K surgery (DrShapiro) for near sightedness about 93yrs ago...  she tried Lyrica for her fibromyalgia and noted that 2/d was too much and made her "loopey" so she decreased it back to 1/d... she also notes a rash under her breast and we Rx'd w/ Lotrisone...   ~  Oct10:  she reports that she was laid off from [employer] where she worked for yrs- and now has no insurance... we discussed mult changes to save $$$... she has done a fabulous job w/ diet + exercise & lost 36# this yr down to 168# today...    ~  November 16, 2010:    BP meds have been adjusted over the yr & currently on Aten25 + Prinizide 10-12.5 w/ BP 130/80 & similar at home she says despite 21# wt gain since 2010; we reviewed low sodium restriction...    She has tolerated the Prav40 very well & due for FLP to see if it is working as well as her Lip20 did previously;  we reviewed low chol/ low fat diet & need to incr exerc & get wt back down...    She has mod severe FM & some OA as well w/ c/o right index finger pain & decr ROM;  we discussed hot soaks & trial MOBIC 7.5 +asking DrSypher to eval (she will call- relative works there)...    [facility] ("[facility]") and on Vit D orally> labs pending today;  stable on Sertraline & Alprazolam, and she tells me she quit smoking 11/10!!!    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  HYPERTENSION (ICD-401.9) - controlled on ATENOLOL 25mg /d & PRINIZIDE 10-12.5 daily... she was switched off  Metoprolol & Diovan/Hct due to cost issues & to take advantage of the [facility]...  Hx of CHEST PAIN, ATYPICAL (ICD-786.59) - related to her fibromyalgia, no recent problems.  HYPERLIPIDEMIA (ICD-272.4) - prev controlled on Lipitor 20mg /d...  she tried off statins but it didn't change her aches/pains and she restarted without additional problems... switched to PRAVASTATIN 40mg /d due to cost issues.  ~  FLP in 2006 on Lip20 showed TChol 148, TG 94, HDL 38, LDL 91  ~  FLP 10/09 on Lip20 showed TChol 128, TG 126, HDL 34, LDL 68  ~  FLP 10/10 on Lip20 showed TChol 123, TG 68, HDL 39, LDL 70... change to PRAV40 for $$$  ~  FLP 2/12 on Prav40 showed TChol 169, TG 150, HDL 48, LDL 92  Hx of GASTRITIS (ICD-535.50) - last EGD was 6/07 by DrGessner showing gastritis and DU... neg HPylori... rec to stop NSAIDs, take PPI Rx = PRILOSEC 20mg /d OTC Rx...  IRRITABLE BOWEL SYNDROME (ICD-564.1) - last colonoscopy was 6/07 by DrGessner showing normal x hems... f/u planned 32yrs.  Hx of NECK PAIN (ICD-723.1) - s/p surgery by DrStern in 1999.  FIBROMYALGIA (ICD-729.1) - diagnosed by DrZieminski in the past...  on TRAMADOL 50mg  Prn (pt stopped prev Lyrica due to $$)...  we discussed her syndrome, and she would be interested in seeing DrDeveshwar- seen 11/09: FM and DJD, given Tramadol...  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 rec to start Vit D 50,000 weekly but only took this for 1 month & switched to OTC Vit D 1000 u daily...  ~  labs 2/12 showed Vit D level = 34, therefore rec incr 2000 u daily.  MIGRAINES, HX OF (ICD-V13.8)  ANXIETY (ICD-300.00) - on ZOLOFT 100mg - taking 1.5 tabs daily & ALPRAZOLAM 0.5mg  Prn...under alot of stress: first w/ mother's illness, then w/ divorce, then w/ loss of job... [place] benefit- she's lost 36# down to 168# (10/10)> but regained the wt after she quit smoking.  PERNICIOUS ANEMIA (ICD-281.0) - Dx w/ low B12 level at [facility] office (151)... referred  here for shots & getting monthly but she can't afford the monthly shots... review of records shows B12 shots  ~every 3months & we will inquire into a less expensive alternative for the shots> they are avail [facility] for $21...  ~  labs 10/10 showed B12 level = 285  ~  labs 2/12 showed B12 level = 364... rec to get B12 shot monthly.   Preventive Screening-Counseling & Management  Alcohol-Tobacco     Smoking Status: quit     Packs/Day: 1.0     Year Quit: 08/2009  Allergies: 1)  ! Sulfa 2)  ! Morphine  Comments:  Nurse/Medical Assistant: The patient's medications and allergies were reviewed with the patient and were updated in the Medication and Allergy Lists.  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 Father died age 20 w/ lung cancer, CAD w/ MI, smoker Mother died age 71 w/ myelodysplaia, HBP, CHF 4 Siblings: 2 Bro & 2 Sis-  one sis died MVA, other sis has fibromyalgia, bro HBP & anemia  Social History: Reviewed history from 06/23/2010 and no changes required. 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    Vital Signs:  Patient profile:   65 year old female Height:      66 inches Weight:      189.25 pounds BMI:     30.66 O2 Sat:      95 % on Room air Temp:     96.8 degrees F oral Pulse rate:   72 / minute BP sitting:   130 / 80  (left arm) Cuff size:   regular  Vitals Entered By: Randell Loop CMA (November 16, 2010 12:01 PM)  O2 Sat at Rest %:  95 O2 Flow:  Room air CC: 16 month ROV &  review of mult medical problems... Is Patient Diabetic? No Pain Assessment Patient in pain? yes      Onset of pain  arthritis pain comes and goes Comments no changes in meds today   Physical Exam  Additional Exam:  WD, Overweight, 65 y/o WF in NAD.Marland KitchenMarland Kitchen +diffuse triggers of FM. GENERAL:  Alert & oriented; pleasant & cooperative... HEENT:  [place]/AT, EOM-wnl, PERRLA, EACs-clear, TMs-wnl, NOSE-clear, THROAT-clear & wnl. NECK:  Scar of prev surg w/ fairROM; no JVD; normal carotid impulses w/o bruits; no thyromegaly or nodules palpated; no lymphadenopathy. CHEST:  Clear to P & A; without wheezes/ rales/ or rhonchi. HEART:  Regular Rhythm; without murmurs/ rubs/ or gallops. ABDOMEN:  Soft & nontender; normal bowel sounds; no organomegaly or masses detected. EXT: without deformities, mild arthritic changes; no varicose veins/ +venous insuffic/ no edema. NEURO:  CN's intact; motor testing normal; sensory testing normal; gait normal & balance OK. DERM:  No lesions noted; intertriginous rash under breast c/w skin fungus...    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  weight is up 21# to 189# & we reviewed diet,  exercise, wt reduction strategies...  Problem # 2:  HYPERTENSION (ICD-401.9) Controlled on revised meds to save $$$... she will continue to monitor at home. Her updated medication list for this problem includes:    Atenolol 25 Mg Tabs (Atenolol) .Marland Kitchen... Take 1 tab by mouth once daily...    Lisinopril-hydrochlorothiazide 10-12.5 Mg Tabs (Lisinopril-hydrochlorothiazide) .Marland Kitchen... Take 1 tab by mouth once daily....  Orders: TLB-BMP (Basic Metabolic Panel-BMET) (80048-METABOL) TLB-Hepatic/Liver Function Pnl (80076-HEPATIC) TLB-CBC Platelet - w/Differential (85025-CBCD) TLB-Lipid Panel (80061-LIPID) TLB-TSH (Thyroid Stimulating Hormone) (84443-TSH) TLB-B12, Serum-Total ONLY (16109-U04) T-Vitamin D (25-Hydroxy) (54098-11914)  Problem # 3:  HYPERLIPIDEMIA (ICD-272.4) FLP on Prav40>  looks good, continue same. Her updated medication list for this problem includes:    Pravastatin Sodium 40 Mg Tabs (Pravastatin sodium) .Marland Kitchen... Take 1 tab by mouth at bedtime...  Problem # 4:  GI >>> GI is stable, we discussed taking the PPI before the eve meal instead of Qhs...  Problem # 5:  FIBROMYALGIA (ICD-729.1) Known FM> tolerates well, uses Tramadol Prn... she also has some OA> esp in right hand index finger- she will check w/ Ortho, Sypher & try hot soaks, Mobic Prn, etc... Her updated medication list for this problem includes:    Tramadol Hcl 50 Mg Tabs (Tramadol hcl) .Marland Kitchen... 1 tab by mouth q6h as needed for pain...    Meloxicam 7.5 Mg Tabs (Meloxicam) .Marland Kitchen... Take 1 tab by mouth once daily as needed for arthritis inflammation...  Problem # 6:  VITAMIN D DEFICIENCY (ICD-268.9) On Vit D 1000 u daily>  34... rec incr to 2000 u daily.  Problem # 7:  PERNICIOUS ANEMIA (ICD-281.0) [facility]...  Her updated medication list for this problem includes:    Cyanocobalamin 1000 Mcg/ml Soln (Cyanocobalamin) .Marland KitchenMarland KitchenMarland KitchenMarland Kitchen subcutaneous once monthly  Problem # 8:  OTHER MEDICAL PROBLEMS AS  NOTED>>>  Complete Medication List: 1)  Atenolol 25 Mg Tabs (Atenolol) .... Take 1 tab by mouth once daily.Marland KitchenMarland Kitchen 2)  Lisinopril-hydrochlorothiazide 10-12.5 Mg Tabs (Lisinopril-hydrochlorothiazide) .... Take 1 tab by mouth once daily.... 3)  Pravastatin Sodium 40 Mg Tabs (Pravastatin sodium) .... Take 1 tab by mouth at bedtime.Marland KitchenMarland Kitchen 4)  Prilosec 20 Mg Cpdr (Omeprazole) .Marland Kitchen.. 1 tab once daily - taken 30 min before a meal... 5)  Tramadol Hcl 50 Mg Tabs (Tramadol hcl) .Marland KitchenMarland KitchenMarland Kitchen  1 tab by mouth q6h as needed for pain.Marland KitchenMarland Kitchen 6)  Sertraline Hcl 100 Mg Tabs (Sertraline hcl) .... Take 1 & 1/2 tabs by mouth once daily.Marland KitchenMarland Kitchen 7)  Alprazolam 0.5 Mg Tabs (Alprazolam) .... 1/2 to 1 tab by mouth three times a day as needed for nreves... 8)  Vitamin D 1000 Unit Tabs (Cholecalciferol) .... Take 1 tab by mouth once daily.Marland KitchenMarland Kitchen 9)  Cyanocobalamin 1000 Mcg/ml Soln (Cyanocobalamin) .Marland Kitchen.. subcutaneous once monthly 10)  Meloxicam 7.5 Mg Tabs (Meloxicam) .... Take 1 tab by mouth once daily as needed for arthritis inflammation...  Patient Instructions: 1)  Today we updated your med list- see below.... 2)  We refilled your meds as discussed... 3)  We wrote a new perscription for generic Mobic to try once dailt as needed for arthritic inflammation.Marland KitchenMarland Kitchen 4)  Today we did your follow up FASTING blod work...  please call the "phone tree" in a few days for your lab results.Marland KitchenMarland Kitchen  5)  Let's work on weight reduction.Marland KitchenMarland Kitchen 6)  Call for any questions... Prescriptions: MELOXICAM 7.5 MG TABS (MELOXICAM) take 1 tab by mouth once daily as needed for arthritis inflammation...  #30 x 12   Entered and Authorized by:   Michele Mcalpine MD   Signed by:   Michele Mcalpine MD on 11/16/2010   Method used:   Print then Give to Patient   RxID:   [id number] CYANOCOBALAMIN 1000 MCG/ML SOLN (CYANOCOBALAMIN) subcutaneous once monthly  #1 vial x 6   Entered and Authorized by:   Michele Mcalpine MD   Signed by:   Michele Mcalpine MD on 11/16/2010   Method used:    Print then Give to Patient   RxID:   [id number] ALPRAZOLAM 0.5 MG  TABS (ALPRAZOLAM) 1/2 to 1 tab by mouth three times a day as needed for nreves...  #100 x 12   Entered and Authorized by:   Michele Mcalpine MD   Signed by:   Michele Mcalpine MD on 11/16/2010   Method used:   Print then Give to Patient   RxID:   [id number] SERTRALINE HCL 100 MG TABS (SERTRALINE HCL) take 1 & 1/2 tabs by mouth once daily...  #45 x 12   Entered and Authorized by:   Michele Mcalpine MD   Signed by:   Michele Mcalpine MD on 11/16/2010   Method used:   Print then Give to Patient   RxID:   [id number] TRAMADOL HCL 50 MG  TABS (TRAMADOL HCL) 1 tab by mouth q6H as needed for pain...  #100 x 12   Entered and Authorized by:   Michele Mcalpine MD   Signed by:   Michele Mcalpine MD on 11/16/2010   Method used:   Print then Give to Patient   RxID:   [id number] PRILOSEC 20 MG  CPDR (OMEPRAZOLE) 1 tab once daily - taken 30 min before a meal...  #30 x 12   Entered and Authorized by:   Michele Mcalpine MD   Signed by:   Michele Mcalpine MD on 11/16/2010   Method used:   Print then Give to Patient   RxID:   [id number] PRAVASTATIN SODIUM 40 MG TABS (PRAVASTATIN SODIUM) take 1 tab by mouth at bedtime...  #90 x 4   Entered and Authorized by:   Michele Mcalpine MD   Signed by:   Michele Mcalpine MD on 11/16/2010   Method used:   Print then Give to Patient  RxID:   [id number] LISINOPRIL-HYDROCHLOROTHIAZIDE 10-12.5 MG TABS (LISINOPRIL-HYDROCHLOROTHIAZIDE) take 1 tab by mouth once daily....  #90 x 4   Entered and Authorized by:   Michele Mcalpine MD   Signed by:   Michele Mcalpine MD on 11/16/2010   Method used:   Print then Give to Patient   RxID:   [id number] ATENOLOL 25 MG TABS (ATENOLOL) take 1 tab by mouth once daily...  #90 x 4   Entered and Authorized by:   Michele Mcalpine MD   Signed by:   Michele Mcalpine MD on 11/16/2010   Method used:   Print then Give to Patient   RxID:   [id number]    Immunization  History:  Influenza Immunization History:    Influenza:  historical (07/14/2010)

## 2010-12-15 NOTE — Letter (Signed)
[facility]   Imported By: Sherian Rein 12/08/2010 15:57:54  _____________________________________________________________________  External Attachment:    Type:   Image     Comment:   External Document

## 2011-02-16 NOTE — Assessment & Plan Note (Signed)
[facility]                             PULMONARY OFFICE NOTE   NAME:[name] [name], [name] [name]                        MRN:          [id number]  DATE:07/05/2007                            DOB:          [birth year]    HISTORY OF PRESENT ILLNESS:  The patient is a 66 year old white female  patient of Dr. Kriste Basque, with a known history of hypertension,  hyperlipidemia presents for an acute office visit.  The patient  complains that over the last 5 days she has had nasal congestion, cough,  fever, chills, and general malaise.  The patient denies any hemoptysis,  orthopnea, PND, leg swelling, recent travel, or antibiotic use.   PAST MEDICAL HISTORY:  Reviewed.   CURRENT MEDICATIONS:  Reviewed.   PHYSICAL EXAMINATION:  GENERAL:  The patient is a pleasant female in no  acute distress.  VITAL SIGNS:  She is afebrile with stable vital signs.  Her O2  saturation is 98% on room air.  HEENT:  Nasal mucosa with some mild erythema, nontender sinuses.  The  positive pharynx is clear.  NECK:  Supple without cervical adenopathy.  No JVD.  RESPIRATORY:  Lung sounds reveal coarse breath sounds.  CARDIAC:  Regular rate.  ABDOMEN:  Soft and nontender.  EXTREMITIES:  Warm without any edema.   IMPRESSION:  Acute upper respiratory infection.   PLAN:  The patient to begin Omnicef x7 days, Mucinex DM twice daily,  Phenergan VC With codeine #8 ounces at 1 teaspoon every 6 hours as  needed for cough, Xopenex nebulizer treatment was given today in the  office.  The patient is to return back with Dr. Kriste Basque as scheduled or  sooner as needed.      Rubye Oaks, NP  Electronically Signed      Lonzo Cloud. Kriste Basque, MD  Electronically Signed   TP/MedQ  DD: 07/06/2007  DT: 07/06/2007  Job #: [id number]

## 2011-02-19 NOTE — Assessment & Plan Note (Signed)
[facility]                             PULMONARY OFFICE NOTE   NAME:[name], [name] SCHMOLL                        MRN:          [id number]  DATE:12/20/2006                            DOB:          05/18/1946    HISTORY OF PRESENT ILLNESS:  The patient is a 65 year old white female  patient of Dr. Jodelle Green with a known history of hypertension,  hyperlipidemia who presents for an acute office visit.  Patient  complains over the last 5 days that she has had irritation of her lips  with some noticed swelling and soreness with some small bumps.  Patient  complains lips are very irritated and sore to touch.  The swelling has  improved and almost resolved; however, they continue to be quite  irritated.  She denies any new medications or makeup.  She has not had  any recent dietary changes either.  Patient has had 1 previous episode  where she had some facial swelling but it has been over 20 years ago.  Patient denies any difficulty swallowing, shortness of breath, chest  pain, rash, palpitations, nausea, vomiting.   PAST MEDICAL HISTORY:  Reviewed.   CURRENT MEDICATIONS:  Reviewed.   PHYSICAL EXAM:  Patient is a pleasant female in no acute distress.  She  is afebrile with stable vital signs.  HEENT:  Nasal mucosa is normal.  TMs are normal.  Posterior pharynx is  clear, no exudate or swelling is noted.  Tongue is midline without  swelling.  Upper and lower lip with some mild puffiness and redness  noted.  No lesions noted.  NECK:  Supple without cervical adenopathy.  No JVD.  LUNG SOUNDS:  Clear, without any wheezing or crackles.  CARDIAC:  Regular rate and rhythm.  ABDOMEN:  Soft and nontender.  EXTREMITIES:  No palpable hepatosplenomegaly.  EXTREMITIES:  Warm without any calf tenderness, cyanosis, clubbing or  edema.  SKIN:  Warm without rash.   IMPRESSION AND PLAN:  Upper and lower lip irritation with swelling,  questionable etiology.  Patient does not  appear to have any angioedema.  Note, patient is on an angiotensin-receptor blocker, Diovan; however,  has been on this for several years.  The patient at this time does not  appear to be having a drug reaction.  Symptoms seem to be improving.  Patient, therefore, is advised to take prednisone 40 mg times 3 days,  begin Xyzal 5 mg at bedtime along with Pepcid  over the next 5 days.  If symptoms do not improve, will need to have  further evaluation.  Possibly need to change also Diovan over to a  different antihypertensive.      Rubye Oaks, NP  Electronically Signed      Lonzo Cloud. Kriste Basque, MD  Electronically Signed   TP/MedQ  DD: 12/20/2006  DT: 12/20/2006  Job #: [id number]

## 2011-02-19 NOTE — H&P (Signed)
NAME:  [name] [name], [name] [name] NO.:  [id number]   MEDICAL RECORD NO.:  [id number]                   PATIENT TYPE:   LOCATION:                                       FACILITY:   PHYSICIAN:  Zenaida Niece, M.D.             DATE OF BIRTH:  04/29/2003   DATE OF ADMISSION:  DATE OF DISCHARGE:                                HISTORY & PHYSICAL   CHIEF COMPLAINT:  Symptomatic cystocele and stress urinary incontinence.   HISTORY OF PRESENT ILLNESS:  This is a 65 year old black female, para 1-0-1-  1, who I saw for annual exam last in November of 2004.  At that time, she  was noted to have urinary leaking with straining and some urgency without  frequency or nocturia.  She had a grade 3 cystocele and minimal rectocele.  Objections were discussed with the patient at that time and she was started  on Vagifem.   She returned to the office on March 25, 2004.  Symptoms were mostly pelvic  pressure, which are worse when she is on her feet, and now associated with  dyspareunia.  She is not emptying her bladder well and does have stress  incontinence and some urgency.  Urinalysis at that time was consistent with  a possible UTI but she also has a grade 3 cystocele, grade 2 rectocele, and  a hypermobile urethra.  The patient wished to proceed for surgical therapy  and is admitted for this at this time.   Urine culture did return with E. coli and a follow-up culture performed on  April 24, 2004 is normal.  Preoperative office cystometrics revealed a  minimal postvoid residual and a first urge at 120 cc, significant urge at  200 cc, and a positive leak with cough.   PAST OBSTETRICAL HISTORY:  One vaginal delivery and one spontaneous  abortion.   PAST MEDICAL HISTORY:  1. She has a history of mitral valve prolapse with questionable     regurgitation followed by [facility].  2. Hypertension.  3. Depression.  4. Osteopenia.  5. Gastroesophageal reflux disease.  6.  Fibromyalgia.   PAST SURGICAL HISTORY:  1. In 1986, she had a TAH.  2. She also has some subsequently had a bilateral salpingo-oophorectomy.  3. She has had a tubal ligation.  4. D&C.  5. Appendectomy.  6. Neck surgery for a ruptured disc.  7. Finger surgery.   ALLERGIES:  SULFA and MORPHINE.   CURRENT MEDICATIONS:  Labetalol, Diovan, Zoloft, Singulair, and Lipitor.   REVIEW OF SYMPTOMS:  Normal bowel function.   FAMILY HISTORY:  The patient's mother had uterine cancer.   PHYSICAL EXAMINATION:  GENERAL:  This is a well-developed, well-nourished  black female in no acute distress.  VITAL SIGNS:  Weight is 173 pounds.  Last blood pressure was 142/80.  NECK:  Supple without lymphadenopathy or thyromegaly.  LUNGS:  Clear to auscultation.  HEART:  Regular rate and rhythm without murmur.  ABDOMEN:  Soft, nontender, and nondistended without palpable masses.  EXTREMITIES:  No edema and nontender.  PELVIC:  Normal external genitalia and she has no adnexal masses.  She has a  grade 3 cystocele with a mobile urethra and a grade 2 rectocele.   ASSESSMENT:  Symptomatic pelvic relaxation with stress urinary incontinence.  All options have been discussed with the patient and she wishes to proceed  with surgical therapy.  Risks of surgery including bleeding, infection,  damage to the surrounding organs have been discussed with the patient as  well as the risk of urinary retention from a tension-free vaginal taping.  The patient understands these risks and wishes to proceed.   PLAN:  To admit the patient on the day of surgery for an anterior and  posterior colporrhaphy and a tension free vaginal tape.  I will give her SBE  prophylaxis due to mitral valve prolapse with possible regurgitation.                                               Zenaida Niece, M.D.    TDM/MEDQ  D:  04/28/2004  T:  04/28/2004  Job:  [id number]

## 2011-02-19 NOTE — Cardiovascular Report (Signed)
   NAME:  [name] [name], [name] [name]                       ACCOUNT NO.:  [id number]   MEDICAL RECORD NO.:  [id number]                   PATIENT TYPE:  OIB   LOCATION:  2856                                 FACILITY:  [facility]   PHYSICIAN:  Learta Codding, M.D.                 DATE OF BIRTH:  [birth year]   DATE OF PROCEDURE:  10/03/2002  DATE OF DISCHARGE:  10/03/2002                              CARDIAC CATHETERIZATION   PROCEDURE PERFORMED:  1. Left heart catheterization.  2. Selective coronary angiography.   DIAGNOSES:  No electrocardiographic coronary artery disease.  Normal left  ventricular function.   INDICATIONS:  The patient is a 65 year old female with risk factor for  coronary artery disease.  The patient referred for diagnostic  catheterization to assess her coronary anatomy.   DESCRIPTION OF PROCEDURE:  After informed consent was obtained the patient  was brought to the catheterization laboratory.  A 6-French arterial sheath  was placed in the right femoral artery using a modified Seldinger technique.  A 6-French JL4 and JR4 catheter used to engage the left and right coronary  arteries respectively.  A 6-French angled pigtail catheter was used for  ventriculography.  At the termination of the procedure all catheters and  sheaths were removed and patient was brought back to the holding area.  No  complications were encountered.   FINDINGS:  1. Normal left ventricular and aortic pressures.  2. Ventriculography:  Ejection fraction 55%.  No wall motion abnormalities.  3. Selective coronary angiography:  The left main coronary artery was a     large caliber vessel with __________ disease.  4. Left anterior descending was a large caliber vessel giving rise to     several diagonal branches with no __________ disease.  5. Circumflex coronary artery was a normal caliber with no flow limiting     lesions.  6. Right coronary artery terminated at the PDA and posterolateral branches.  There was no coronary artery disease.   RECOMMENDATIONS:  No further cardiac workup appears to be indicated.  The  plan is to discharge patient home after bedrest has been complete.  The  patient can contact Learta Codding, M.D. if she has recurrent substernal  chest pain but no cardiac etiology was established.                                               Learta Codding, M.D.    GED/MEDQ  D:  02/26/2003  T:  02/26/2003  Job:  [id number]

## 2011-02-19 NOTE — Assessment & Plan Note (Signed)
 HEALTHCARE                               PULMONARY OFFICE NOTE   NAME:[name] [name], [name] [name]                        MRN:          [id number]  DATE:06/28/2006                            DOB:          1946/02/12    HISTORY OF PRESENT ILLNESS:  The patient is a 65 year old white female  patient of Dr. Jodelle Green who has a known history of hypertension,  hyperlipidemia, who presents for an acute office visit.  The patient reports  this morning approximately 5:00 a.m. that she was getting out of bed and  fell into the dresser beside her bed.  She subsequently hit her right eye  and has significant bruising and swelling.  The patient reports she did not  lose consciousness, but felt somewhat unsteady and had to have her husband  help her up.  She denies any visual changes, chest pain, palpitations,  extremity weakness, headache, or clear or bloody fluid from the ear or nasal  passages.   PAST MEDICAL HISTORY:  Reviewed.   CURRENT MEDICATIONS:  Reviewed.   PHYSICAL EXAM:  The patient is a pleasant female in no acute distress.  She is afebrile with stable vital signs.  O2 saturation is 96% on room air.  HEENT:  PERRLA.  EOMI without nystagmus.  The patient has significant  bruising and swelling along the right upper and lower orbital area.  TMs are  normal appearing.  Funduscopic exam was unremarkable.  NECK:  Supple without adenopathy.  No JVD or bruit.  LUNGS:  Sounds are clear.  CARDIAC:  Regular rate and rhythm.  ABDOMEN:  Soft and nontender.  EXTREMITIES:  Warm without any edema.   IMPRESSION AND PLAN:  Right eye injury secondary to fall.  The patient is to  apply cool compresses.  We will check an orbital x-ray to rule out fracture.  We will contact the patient with these results.  The patient has followup  with Dr. Kriste Basque and should followup as scheduled.  The  patient is to return if she develops any visual changes, headache,  dizziness, or additional  symptoms.                                  ______________________________                                Rubye Oaks, NP                                  ______________________________                                Lonzo Cloud. Kriste Basque, MD    TP/MedQ  DD:  06/29/2006  DT:  06/30/2006  Job #:  [id number]

## 2011-02-19 NOTE — Assessment & Plan Note (Signed)
[name]                             PULMONARY OFFICE NOTE   NAME:[name] [name], [name] [name]                          MRN:          [id number]  DATE:08/14/2007                            DOB:          1946-03-23    HISTORY OF PRESENT ILLNESS:  The patient is a very pleasant 65 year old  white female patient of Dr. Lonzo Cloud. [name]'s, with a known history of  hypertension and hyperlipidemia.  The patient presents today for  laboratory followup.  She was recently seen by her rheumatologist.  Laboratory work at Dr. Excell Seltzer. Zieminski's office, showing that the  patient had a low normal hemoglobin at 12.1 and a low B12 level at 151.  The patient presents to start on B12 shots today.  The patient also has  recently had an upper respiratory infection and was treated with  antibiotics.  The patient's symptoms have waxed and waned.  Over the  last few days, pt complains of a minimally-productive dry cough and  wheezing.  She denies any problems with sputum, fever, chest pain,  orthopnea, PND or leg swelling.   PAST MEDICAL HISTORY:  Is reviewed.   MEDICATIONS:  Are reviewed.   PHYSICAL EXAMINATION:  GENERAL:  The patient is a pleasant female, in no  acute distress.  VITAL SIGNS:  Stable.  HEENT:  Unremarkable.  NECK:  Supple without cervical adenopathy.  No jugular venous  distention.  LUNGS:  Sounds are coarse without wheezes or crackles.  HEART:  A regular rate.  ABDOMEN:  Soft, nontender.  EXTREMITIES:  Warm without any edema.   IMPRESSION/PLAN:  1. Vitamin B12 deficiency with mild pernicious anemia:  The patient is      to begin B12 injections, 1000 mg given today.  She will return once      a month for these injections.  Will recheck her B12 level and CBC      in three months.  2. Slow to resolve bronchitis:  The patient is encouraged on smoking      cessation.  Begin Mucinex DM twice daily and a prednisone taper      over the next week.   FOLLOWUP:  She  is to return back to Dr. Lonzo Cloud. [name] in one month, or  sooner if needed.      Rubye Oaks, NP  Electronically Signed      Lonzo Cloud. Kriste Basque, MD  Electronically Signed   TP/MedQ  DD: 08/15/2007  DT: 08/16/2007  Job #: [id number]

## 2011-02-19 NOTE — Op Note (Signed)
NAME:  [name] [name], [name] [name]                           ACCOUNT NO.:  [id number]   MEDICAL RECORD NO.:  [id number]                   PATIENT TYPE:  INP   LOCATION:  9399                                 FACILITY:  [facility]   PHYSICIAN:  Zenaida Niece, M.D.             DATE OF BIRTH:  12-07-1945   DATE OF PROCEDURE:  04/29/2004  DATE OF DISCHARGE:                                 OPERATIVE REPORT   PREOPERATIVE DIAGNOSES:  1. Cystocele.  2. Rectocele.  3. Stress urinary incontinence.   POSTOPERATIVE DIAGNOSES:  1. Cystocele.  2. Rectocele.  3. Stress urinary incontinence.   OPERATION PERFORMED:  Anterior and posterior colporrhaphy and tension for  vaginal tape.   SURGEON:  Zenaida Niece, M.D.   ASSISTANT:  Malachi Pro. Ambrose Mantle, M.D.   ANESTHESIA:  General with an LMA.   ESTIMATED BLOOD LOSS:  100 mL.   FINDINGS:  She had a grade 2 to 3 cystocele and a grade 2 rectocele.  After  placing the TVT initially, the vaginal needle was in the bladder on the left  side.  This was replaced and was found to be exterior to the bladder.   DESCRIPTION OF PROCEDURE:  The patient was taken to the operating room and  placed in dorsal supine position.  General anesthesia was induced and she  was placed in mobile stirrups.  Lower abdomen, perineum and vagina were then  prepped and draped in the usual sterile fashion and bladder drained with a  red rubber catheter.  A weighted speculum was then inserted into the vagina  and the vaginal cuff was grasped with Allis clamps.  A piece of vaginal  mucosa was removed sharply.  The vagina was then dissected in the midline  from the vaginal cuff to about 3 cm from the urethral meatus. The vagina was  then dissected laterally to mobilize the cystocele.  The cystocele was then  reduced with interrupted sutures of 2-0 Vicryl with good reduction.  Excess  vaginal mucosa was removed and this anterior vaginal wall incision was  closed with running locking 2-0 Vicryl  with adequate closure and hemostasis.   Attention was turned to the posterior colporrhaphy.  The hymenal ring was  grasped with Allis clamps and a piece of tissue was removed sharply.  Vaginal mucosa was then dissected sharply from the hymenal ring to  approximately 1 cm distal to the vaginal cuff in the midline.  This was then  dissected laterally to mobilize the rectocele.  The rectocele was then  reduced with interrupted sutures of 2-0 Vicryl.  An over glove was placed on  the finger placed in the rectum.  There was no rectal injury or sutures  palpated.  The glove was removed.  Vaginal mucosa was then closed with  running locking 2-0 Vicryl.  Halfway through the closure there was noted to  be some bleeding from underneath the incision.  This was controlled with  electrocautery.   Attention was then turned to the TVT.  The legs were lowered halfway in the  stirrups and the patient was placed in Trendelenburg.  Local anesthesia with  a mixture of 1% lidocaine and 0.5% Marcaine with epinephrine was infiltrated  suprapubically on each side for hydrodissection and local anesthetic.  Vaginal mucosa approximately 1.5 cm proximal to the urethral meatus was also  infiltrated with local anesthesia and this was done in the midline and  laterally for hydrodissection.  A 1.5 cm vertical incision was made in the  vaginal mucosa and this was dissected laterally to make a pathway for the  TVT.  The vaginal needles were then used on the TVT and this was passed on  each side, being careful to hug the pubic bone.  Small incisions were made  suprapubically to allow passage of the needles.  Cystoscopy was then  performed with a 70 degree cystoscope.  The needle on the right side was  fine but initially the needle on the left side had traversed the bladder.  The left-sided needle was removed and placed more laterally.  Repeat  cystoscopy revealed that the left side was now free of the bladder and the   right side again was free of the bladder.  The needles were then pulled  through the skin and removed.  Curved Mayo scissors were used between the  urethra and the TVT and this was adjusted with minimal tension.  The sleeves  were then removed and the tape cut at the skin edges.  The vaginal mucosa  was then closed with running locking 2-0 Vicryl and suprapubic incision was  closed with Dermabond.  A vaginal packing was then placed with two-inch  iodoform gauze as well as a Foley catheter.  The patient tolerated the  procedure well and was taken to the recovery room in stable condition.  Counts were correct times two.  She received Ancef and gentamicin  antibiotics, she had PAS hose on throughout the procedure.  She will receive  one more dose of antibiotics for SBE prophylaxis and then she will be put on  Levaquin while she has the Foley catheter in for five days.                                               Zenaida Niece, M.D.    TDM/MEDQ  D:  04/29/2004  T:  04/29/2004  Job:  [id number]

## 2011-02-19 NOTE — Op Note (Signed)
   NAME:  [name] [name], [name] [name]                             ACCOUNT NO.:  [id number]   MEDICAL RECORD NO.:  [id number]                   PATIENT TYPE:   LOCATION:                                       FACILITY:   PHYSICIAN:  Vikki Ports, M.D.         DATE OF BIRTH:   DATE OF PROCEDURE:  DATE OF DISCHARGE:                                 OPERATIVE REPORT   PREOPERATIVE DIAGNOSIS:  Right neck and left breast sebaceous cyst.   POSTOPERATIVE DIAGNOSIS:  Right neck and left breast sebaceous cyst.   OPERATION/PROCEDURE:  Excision of 1 cm right neck sebaceous cyst and 2 cm  left breast sebaceous cyst.   ANESTHESIA:  Local MAC.   SURGEON:  Vikki Ports, M.D.   DESCRIPTION OF PROCEDURE:  The patient was taken to the operating room and  placed in the supine position after adequate MAC anesthesia was induced.  The left breast and right neck were prepped and draped in a normal sterile  fashion.  Using 1% lidocaine local anesthesia, the skin surrounding the  lesion and the left breast was anesthetized. A curvilinear incision was made  superiorly and then connected for an elliptical incision inferiorly.  Dissected down through the subcutaneous tissue excising the cyst in its  entirety.  Adequate hemostasis was ensured and the defect was closed with a  subcuticular 4-0 Monocryl.  An identical procedure was then performed of the  right neck, also with an elliptical incision, excision a 1 cm subcutaneous  nodule consistent with a sebaceous cyst in its entirety.  It was also closed  with subcuticular 4-0 Monocryl.  Steri-Strips were applied.  The patient  tolerated the procedure well and went to PACU in good condition.                                                 Vikki Ports, M.D.    KRH/MEDQ  D:  05/18/2002  T:  05/20/2002  Job:  [id number]

## 2011-04-27 ENCOUNTER — Telehealth: Payer: Self-pay

## 2011-04-27 DIAGNOSIS — K589 Irritable bowel syndrome without diarrhea: Secondary | ICD-10-CM

## 2011-04-27 NOTE — Telephone Encounter (Signed)
Called, spoke with pt.  States for the last 2-3 wks IBS has been flared up.  C/o aching, constant pain.  At times it is worse on one side and other times it is all over pain. States it feels "knotted up" at times.  BMs are normal.  Eating doesn't effect pain.  Some nausea last week.  Taking tylenol with no relief.  States she has heard low iron levels could cause IBS to be flared up.  She does not have insurance at this time -- she is requesting to come in to have iron level checked and then if this is not the problem go from there -- Dr. Kriste Basque, pls advise.  Thanks!

## 2011-04-28 MED ORDER — DICYCLOMINE HCL 20 MG PO TABS: 20.0000 mg | ORAL_TABLET | Freq: Four times a day (QID) | ORAL | Status: DC

## 2011-04-28 NOTE — Telephone Encounter (Signed)
Pt returned my call and she is aware of meds sent to her pharmacy and that we will set up appt for her to see GI.

## 2011-04-28 NOTE — Telephone Encounter (Signed)
Per SN---SN has never heard of this.   Try the bentyl 20mg     #50   1 po every 6 hours prn for abd cramping.  And she will need ov with Dr. Leone Payor asap.  Order has been placed for this appt with GI.   lmomtcb for pt to make her aware of SN recs and to find out name of pharmacy.

## 2011-04-29 ENCOUNTER — Encounter: Payer: Self-pay | Admitting: Internal Medicine

## 2011-06-03 ENCOUNTER — Ambulatory Visit: Payer: Self-pay | Admitting: Internal Medicine

## 2011-06-10 ENCOUNTER — Other Ambulatory Visit: Payer: Self-pay | Admitting: Pulmonary Disease

## 2011-06-11 ENCOUNTER — Other Ambulatory Visit: Payer: Self-pay | Admitting: Pulmonary Disease

## 2011-06-14 ENCOUNTER — Telehealth: Payer: Self-pay | Admitting: Pulmonary Disease

## 2011-06-14 NOTE — Telephone Encounter (Signed)
ATC line busy x 3 WCB 

## 2011-06-15 NOTE — Telephone Encounter (Signed)
Pt states she at the pharmacy now and it was the pharmacy mix up not ours and she states it is now fixed and nothing further is needed

## 2011-07-02 ENCOUNTER — Ambulatory Visit (INDEPENDENT_AMBULATORY_CARE_PROVIDER_SITE_OTHER): Payer: Self-pay | Admitting: Adult Health

## 2011-07-02 ENCOUNTER — Encounter: Payer: Self-pay | Admitting: Adult Health

## 2011-07-02 VITALS — BP 112/64 | HR 61 | Temp 97.1°F | Ht 66.5 in | Wt 193.2 lb

## 2011-07-02 DIAGNOSIS — IMO0001 Reserved for inherently not codable concepts without codable children: Secondary | ICD-10-CM

## 2011-07-02 DIAGNOSIS — Z23 Encounter for immunization: Secondary | ICD-10-CM

## 2011-07-02 MED ORDER — CYCLOBENZAPRINE HCL 5 MG PO TABS: 5.0000 mg | ORAL_TABLET | Freq: Two times a day (BID) | ORAL | Status: DC | PRN

## 2011-07-02 NOTE — Patient Instructions (Addendum)
Begin Flexeril 5mg  mg Twice daily  As needed  Muscle spasm/soreness  Exercise as tolerated.  Please contact office for sooner follow up if symptoms do not improve or worsen or seek emergency care  Flu shot today

## 2011-07-02 NOTE — Progress Notes (Signed)
Subjective:    Patient ID: [name] [name], female    DOB: 1945/10/13, 65 y.o.   MRN: [id number]  HPI 65 y/o WF ([name] [name]'s mother)   ~ Oct09: she has mult somatic complaints and is under alot of stress w/ recent separation... 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 she tried Lyrica for her fibromyalgia and noted that 2/d was too much and made her "loopey" so she decreased it back to 1/d... she also notes a rash under her breast and we Rx'd w/ Lotrisone...   ~ Oct10: she reports that she was laid off from [employer] where she worked for yrs- and now has no insurance... we discussed mult changes to save $$$... she has done a fabulous job w/ diet + exercise & lost 36# this yr down to 168# today...   ~ November 16, 2010:  BP meds have been adjusted over the yr & currently on Aten25 + Prinizide 10-12.5 w/ BP 130/80 & similar at home she says despite 21# wt gain since 2010; we reviewed low sodium restriction...  She has tolerated the Prav40 very well & due for FLP to see if it is working as well as her Lip20 did previously; we reviewed low chol/ low fat diet & need to incr exerc & get wt back down...  She has mod severe FM & some OA as well w/ c/o right index finger pain & decr ROM; we discussed hot soaks & trial MOBIC 7.5 +asking DrSypher to eval (she will call- relative works there)...  [facility] ("[facility]") and on Vit D orally> labs pending today; stable on Sertraline & Alprazolam, and she tells me she quit smoking 11/10!!!   07/02/2011 Acute OV  Complains of  no energy, pain through her back., muscles aching along neck, shoulders, and arms. Feels achy all the time exp after working in the yard.  Has been under a lot of stress with financial and family stressors. No exertional chest pain or dyspnea.  Pt states she doesn't feel liker herself x 2 months. Did not feel Mobic  yet for joint aches. We discussed exercise and stress reducers.  She does not have any insurance and this is a big stressor.  She had labs done earlier this year that were normal , we reviewed these findings.     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   HYPERTENSION (ICD-401.9) - controlled on ATENOLOL 25mg /d & PRINIZIDE 10-12.5 daily... she was switched off Metoprolol & Diovan/Hct due to cost issues & to take advantage of the [facility]...   Hx of CHEST PAIN, ATYPICAL (ICD-786.59) - related to her fibromyalgia, no recent problems.   HYPERLIPIDEMIA (ICD-272.4) - prev controlled on Lipitor 20mg /d... she tried off statins but it didn't change her aches/pains and she restarted without additional problems... switched to PRAVASTATIN 40mg /d due to cost issues.  ~ FLP in 2006 on Lip20 showed TChol 148, TG 94, HDL 38, LDL 91  ~ FLP 10/09 on Lip20 showed TChol 128, TG 126, HDL 34, LDL 68  ~ FLP 10/10 on Lip20 showed TChol 123, TG 68, HDL 39, LDL 70... change to PRAV40 for $$$  ~ FLP 2/12 on Prav40 showed TChol 169, TG 150, HDL 48, LDL 92   Hx of GASTRITIS (WUJ-811.91) - last EGD was 6/07  by DrGessner showing gastritis and DU... neg HPylori... rec to stop NSAIDs, take PPI Rx = PRILOSEC 20mg /d OTC Rx...   IRRITABLE BOWEL SYNDROME (ICD-564.1) - last colonoscopy was 6/07 by DrGessner showing normal x hems... f/u planned 51yrs.   Hx of NECK PAIN (ICD-723.1) - s/p surgery by DrStern in 1999.   FIBROMYALGIA (ICD-729.1) - diagnosed by DrZieminski in the past... on TRAMADOL 50mg  Prn (pt stopped prev Lyrica due to $$)... we discussed her syndrome, and she would be interested in seeing  DrDeveshwar- seen 11/09: FM and DJD, given Tramadol...   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 rec to start Vit D 50,000 weekly but only took this for 1 month & switched to OTC Vit D 1000 u daily...  ~ labs 2/12 showed Vit D  level = 34, therefore rec incr 2000 u daily.   MIGRAINES, HX OF (ICD-V13.8)  ANXIETY (ICD-300.00) - on ZOLOFT 100mg - taking 1.5 tabs daily & ALPRAZOLAM 0.5mg  Prn...under alot of stress: first w/ mother's illness, then w/ divorce, then w/ loss of job... [employer] benefit- she's lost 36# down to 168# (10/10)> but regained the wt after she quit smoking.   PERNICIOUS ANEMIA (ICD-281.0) - Dx w/ low B12 level at [facility] office (151)... referred here for shots & getting monthly but she can't afford the monthly shots... review of records shows B12 shots ~every 3months & we will inquire into a less expensive alternative for the shots> [facility]...  ~ labs 10/10 showed B12 level = 285  ~ labs 2/12 showed B12 level = 364... rec to get B12 shot monthly.     Review of Systems Constitutional:   No  weight loss, night sweats,  Fevers, chills, + fatigue, or  lassitude.  HEENT:   No headaches,  Difficulty swallowing,  Tooth/dental problems, or  Sore throat,                No sneezing, itching, ear ache, nasal congestion, post nasal drip,   CV:  No chest pain,  Orthopnea, PND, swelling in lower extremities, anasarca, dizziness, palpitations, syncope.   GI  No heartburn, indigestion, abdominal pain, nausea, vomiting, diarrhea, change in bowel habits, loss of appetite, bloody stools.   Resp: No shortness of breath with exertion or at rest.  No excess mucus, no productive cough,  No non-productive cough,  No coughing up of blood.  No change in color of mucus.  No wheezing.  No chest wall deformity  Skin: no rash or lesions.  GU: no dysuria, change in color of urine, no urgency or frequency.  No flank pain, no hematuria   MS: -joint pain Neg swelling.  No decreased range of motion.     Psych:  No change in mood or affect. + anxiety.  No memory loss.         Objective:   Physical Exam GEN: A/Ox3; pleasant , NAD, well nourished   HEENT:  [place]/AT,   EACs-clear, TMs-wnl, NOSE-clear, THROAT-clear, no lesions, no postnasal drip or exudate noted.   NECK:  Supple w/ fair ROM; no JVD; normal carotid impulses w/o bruits; no thyromegaly or nodules palpated; no lymphadenopathy.  RESP  Clear  P & A; w/o, wheezes/ rales/ or rhonchi.no accessory muscle use, no dullness to percussion  CARD:  RRR, no m/r/g  , no peripheral edema, pulses intact, no cyanosis or clubbing.  GI:   Soft & nt; nml bowel sounds; no organomegaly  or masses detected.  Musco: Warm bil, no deformities or joint swelling noted. + triggers points along upper back and chest   Neuro: alert, no focal deficits noted.    Skin: Warm, no lesions or rashes        Assessment & Plan:

## 2011-07-02 NOTE — Assessment & Plan Note (Signed)
Flare with associated stress  Plan:  Stress reducers Trial of flexeril for muscle pain  Adequate sleep  Fluids and proper diet.  Stretching and exercise  May use motrin As needed  Joint pains.  Please contact office for sooner follow up if symptoms do not improve or worsen or seek emergency care

## 2011-07-06 ENCOUNTER — Telehealth: Payer: Self-pay | Admitting: Pulmonary Disease

## 2011-07-06 NOTE — Telephone Encounter (Signed)
Forwarded to Dr. [name] for review. °

## 2011-11-17 ENCOUNTER — Ambulatory Visit: Payer: Self-pay | Admitting: Pulmonary Disease

## 2011-11-24 ENCOUNTER — Other Ambulatory Visit: Payer: Self-pay | Admitting: Pulmonary Disease

## 2011-12-21 ENCOUNTER — Telehealth: Payer: Self-pay | Admitting: Pulmonary Disease

## 2011-12-21 NOTE — Telephone Encounter (Signed)
Received 3 pages. Sent to Dr. Kriste Basque. SD 12/20/11

## 2011-12-22 ENCOUNTER — Telehealth: Payer: Self-pay | Admitting: Pulmonary Disease

## 2011-12-22 NOTE — Telephone Encounter (Signed)
lmomtcb for pt to discuss her meds.

## 2011-12-23 ENCOUNTER — Other Ambulatory Visit: Payer: Self-pay | Admitting: Pulmonary Disease

## 2011-12-23 NOTE — Telephone Encounter (Signed)
PT RETURNED CALL . [name] [name]  °

## 2011-12-23 NOTE — Telephone Encounter (Signed)
LMTCB

## 2011-12-24 NOTE — Telephone Encounter (Signed)
Pt called back and stated that she did get her meds today.  She will be on medicare starting in June and she has set up appt to see SN in June.  She stated that the pharmacy will be sending refill requests to SN for some of her other meds and she is aware that we will fill these until June.  Nothing further needed.

## 2011-12-24 NOTE — Telephone Encounter (Signed)
lmomtcb for pt 

## 2011-12-28 ENCOUNTER — Other Ambulatory Visit: Payer: Self-pay | Admitting: Pulmonary Disease

## 2012-01-06 ENCOUNTER — Other Ambulatory Visit: Payer: Self-pay | Admitting: Pulmonary Disease

## 2012-01-13 ENCOUNTER — Other Ambulatory Visit: Payer: Self-pay | Admitting: Pulmonary Disease

## 2012-02-21 ENCOUNTER — Other Ambulatory Visit: Payer: Self-pay | Admitting: Pulmonary Disease

## 2012-02-23 ENCOUNTER — Telehealth: Payer: Self-pay | Admitting: Pulmonary Disease

## 2012-02-23 MED ORDER — ALPRAZOLAM 0.5 MG PO TABS: ORAL_TABLET | ORAL | Status: DC

## 2012-02-23 NOTE — Telephone Encounter (Signed)
Pt requesting refill on alprazolam 0.5 mg. Last refilled 06/11/11 #90 x 5 refills. Please advise thanks

## 2012-02-23 NOTE — Telephone Encounter (Signed)
[name] [name] is aware rx has been sent to the pharmacy

## 2012-02-23 NOTE — Telephone Encounter (Signed)
Per SN---ok to give #30 of the alprazolam.  thanks

## 2012-02-29 ENCOUNTER — Other Ambulatory Visit: Payer: Self-pay | Admitting: Pulmonary Disease

## 2012-03-10 ENCOUNTER — Other Ambulatory Visit: Payer: Self-pay | Admitting: Pulmonary Disease

## 2012-03-22 ENCOUNTER — Other Ambulatory Visit (INDEPENDENT_AMBULATORY_CARE_PROVIDER_SITE_OTHER): Payer: Medicare Other

## 2012-03-22 ENCOUNTER — Ambulatory Visit (INDEPENDENT_AMBULATORY_CARE_PROVIDER_SITE_OTHER): Payer: Medicare Other | Admitting: Pulmonary Disease

## 2012-03-22 ENCOUNTER — Ambulatory Visit (INDEPENDENT_AMBULATORY_CARE_PROVIDER_SITE_OTHER)
Admission: RE | Admit: 2012-03-22 | Discharge: 2012-03-22 | Disposition: A | Discharge status: Home / Self Care | Payer: Medicare Other | Source: Ambulatory Visit | Attending: Pulmonary Disease | Admitting: Pulmonary Disease

## 2012-03-22 ENCOUNTER — Encounter: Payer: Self-pay | Admitting: Pulmonary Disease

## 2012-03-22 VITALS — BP 118/70 | HR 54 | Temp 96.9°F | Ht 66.5 in | Wt 189.6 lb

## 2012-03-22 DIAGNOSIS — IMO0001 Reserved for inherently not codable concepts without codable children: Secondary | ICD-10-CM

## 2012-03-22 DIAGNOSIS — E785 Hyperlipidemia, unspecified: Secondary | ICD-10-CM

## 2012-03-22 DIAGNOSIS — K2990 Gastroduodenitis, unspecified, without bleeding: Secondary | ICD-10-CM

## 2012-03-22 DIAGNOSIS — I10 Essential (primary) hypertension: Secondary | ICD-10-CM

## 2012-03-22 DIAGNOSIS — D510 Vitamin B12 deficiency anemia due to intrinsic factor deficiency: Secondary | ICD-10-CM

## 2012-03-22 DIAGNOSIS — F411 Generalized anxiety disorder: Secondary | ICD-10-CM

## 2012-03-22 DIAGNOSIS — E559 Vitamin D deficiency, unspecified: Secondary | ICD-10-CM

## 2012-03-22 DIAGNOSIS — I872 Venous insufficiency (chronic) (peripheral): Secondary | ICD-10-CM

## 2012-03-22 DIAGNOSIS — E538 Deficiency of other specified B group vitamins: Secondary | ICD-10-CM

## 2012-03-22 DIAGNOSIS — Z0000 Encounter for general adult medical examination without abnormal findings: Secondary | ICD-10-CM

## 2012-03-22 DIAGNOSIS — R0789 Other chest pain: Secondary | ICD-10-CM

## 2012-03-22 DIAGNOSIS — K589 Irritable bowel syndrome without diarrhea: Secondary | ICD-10-CM

## 2012-03-22 DIAGNOSIS — K2970 Gastritis, unspecified, without bleeding: Secondary | ICD-10-CM

## 2012-03-22 DIAGNOSIS — R002 Palpitations: Secondary | ICD-10-CM

## 2012-03-22 LAB — LIPID PANEL
HDL: 51.6 mg/dL (ref >39.00)
LDL Cholesterol: 89 mg/dL (ref 0–99)
VLDL: 22.8 mg/dL (ref 0.0–40.0)

## 2012-03-22 LAB — BASIC METABOLIC PANEL
BUN: 26 mg/dL — ABNORMAL HIGH (ref 6–23)
Calcium: 9.8 mg/dL (ref 8.4–10.5)
GFR: 63.52 mL/min (ref >60.00)
Potassium: 4.5 mEq/L (ref 3.5–5.1)
Sodium: 141 mEq/L (ref 135–145)

## 2012-03-22 LAB — HEPATIC FUNCTION PANEL
AST: 17 U/L (ref 0–37)
Albumin: 4.3 g/dL (ref 3.5–5.2)
Total Bilirubin: 0.6 mg/dL (ref 0.3–1.2)

## 2012-03-22 LAB — CBC WITH DIFFERENTIAL/PLATELET
Basophils Absolute: 0 10*3/uL (ref 0.0–0.1)
Eosinophils Absolute: 0.3 10*3/uL (ref 0.0–0.7)
Lymphocytes Relative: 31.2 % (ref 12.0–46.0)
MCHC: 32.6 g/dL (ref 30.0–36.0)
Neutrophils Relative %: 57.2 % (ref 43.0–77.0)
RBC: 4.15 Mil/uL (ref 3.87–5.11)
RDW: 13.6 % (ref 11.5–14.6)

## 2012-03-22 LAB — VITAMIN B12: Vitamin B-12: 261 pg/mL (ref 211–911)

## 2012-03-22 MED ORDER — CYANOCOBALAMIN 1000 MCG/ML IJ SOLN: 1000.0000 ug | INTRAMUSCULAR | Status: DC

## 2012-03-22 MED ORDER — MELOXICAM 7.5 MG PO TABS: 7.5000 mg | ORAL_TABLET | Freq: Every day | ORAL | Status: DC

## 2012-03-22 MED ORDER — ALPRAZOLAM 0.5 MG PO TABS: ORAL_TABLET | ORAL | Status: DC

## 2012-03-22 MED ORDER — CYCLOBENZAPRINE HCL 5 MG PO TABS: 5.0000 mg | ORAL_TABLET | Freq: Two times a day (BID) | ORAL | Status: DC | PRN

## 2012-03-22 MED ORDER — DICYCLOMINE HCL 20 MG PO TABS: 20.0000 mg | ORAL_TABLET | Freq: Four times a day (QID) | ORAL | Status: DC

## 2012-03-22 MED ORDER — PRAVASTATIN SODIUM 40 MG PO TABS: 40.0000 mg | ORAL_TABLET | Freq: Every day | ORAL | Status: DC

## 2012-03-22 MED ORDER — TRAMADOL HCL 50 MG PO TABS: 50.0000 mg | ORAL_TABLET | Freq: Four times a day (QID) | ORAL | Status: DC | PRN

## 2012-03-22 MED ORDER — LISINOPRIL-HYDROCHLOROTHIAZIDE 10-12.5 MG PO TABS: 1.0000 | ORAL_TABLET | Freq: Every day | ORAL | Status: DC

## 2012-03-22 MED ORDER — SERTRALINE HCL 100 MG PO TABS: 100.0000 mg | ORAL_TABLET | Freq: Every day | ORAL | Status: DC

## 2012-03-22 MED ORDER — ATENOLOL 25 MG PO TABS: 25.0000 mg | ORAL_TABLET | Freq: Every day | ORAL | Status: DC

## 2012-03-22 MED ORDER — OMEPRAZOLE 20 MG PO CPDR: 20.0000 mg | DELAYED_RELEASE_CAPSULE | Freq: Every day | ORAL | Status: DC

## 2012-03-22 MED ORDER — CYANOCOBALAMIN 1000 MCG/ML IJ SOLN
1000.0000 ug | Freq: Once | INTRAMUSCULAR | Status: AC
  Administered 2012-03-22: 1000 ug via INTRAMUSCULAR

## 2012-03-22 NOTE — Patient Instructions (Addendum)
Today we updated your med list in our EPIC system...    Continue your current medications the same...  Today we did your follow up CXR, EKG, & FASTING blood work...    We will call you w/ the results when avail...  We will also arrange for an outpatient HOLTER monitor to evaluate your skipping...    In the meanwhile you may continue the ALPRAZOLAM 1/2 tab three times daily to decrease the palpitation symptoms...  Let's get on track w/ our diet & exercise program...  Call for any questions...  We wrote a prescription for the SHINGLES vaccines as we discussed.Marland KitchenMarland Kitchen

## 2012-03-23 LAB — VITAMIN D 25 HYDROXY (VIT D DEFICIENCY, FRACTURES): Vit D, 25-Hydroxy: 37 ng/mL (ref 30–89)

## 2012-03-26 ENCOUNTER — Encounter: Payer: Self-pay | Admitting: Pulmonary Disease

## 2012-03-26 NOTE — Progress Notes (Signed)
Subjective:     Patient ID: [name] [name], female   DOB: March 14, 1946, 66 y.o.   MRN: [id number]  HPI 66 y/o WF ([name] [name]'s mother) here for a follow up visit and review of her medical issues...   ~  Oct09:  she has mult somatic complaints and is under alot of stress w/ recent separation... she notes some recent eye problems- steming from prev radial-K surgery (DrShapiro) for near sightedness about 59yrs ago...  she tried Lyrica for her fibromyalgia and noted that 2/d was too much and made her "loopey" so she decreased it back to 1/d... she also notes a rash under her breast and we Rx'd w/ Lotrisone...  ~  Oct10:  she reports that she was laid off from [employer] where she worked for yrs- and now has no insurance... we discussed mult changes to save $$$... she has done a fabulous job w/ diet + exercise & lost 36# this yr down to 168# today...   ~  November 16, 2010:    BP meds have been adjusted over the yr & currently on Aten25 + Prinizide 10-12.5 w/ BP 130/80 & similar at home she says despite 21# wt gain since 2010; we reviewed low sodium restriction...    She has tolerated the Prav40 very well & due for FLP to see if it is working as well as her Lip20 did previously;  we reviewed low chol/ low fat diet & need to incr exerc & get wt back down...    She has mod severe FM & some OA as well w/ c/o right index finger pain & decr ROM;  we discussed hot soaks & trial MOBIC 7.5 +asking DrSypher to eval (she will call- relative works there)...    [facility] ("[facility]") and on Vit D orally> labs pending today;  stable on Sertraline & Alprazolam, and she tells me she quit smoking 11/10!!!  ~  March 22, 2012:  53mo ROV & [name] [name] is now 29 & covered by Medicare; she denies CP but has occas palpit- off & on for the last month or so; skipping sensation "turns over" and takes her breath away; it occurs on most days & she notes that Xanax helps; we discussed checking  Holter monitor to "catch" the arrhythmia & Rx w/ no caffeine etc for now... Ex-smoker> she quit smoking in 2010 & is still not smoking, denies resp symptoms... HBP> on Aten25, LisinHCT10-12.5; BP= 118/70 & she denies CP, SOB, edema; +palpit as above... Chol> on Prav40; FLP showed TChol 163, TG 114, HDL 52, LDL 89; continue same... GI> HxGastritis, IBS> on Prilosec & Bentyl20; she is doing well & denies abd pain, n/v, c/d, blood seen, etc... Ortho> DJD, HxNeckPain, FM> on Tramadol, Flexeril5mg , & VitD 1000u/d; she has some knee discomfort & uses tramadol prn; advised to get wt down, use knee brace w/ exercise... Anxiety> on Zoloft100-1.5tabs/d & Xanax0.5; she uses the Alpraz Qhs for sleep... Penicious Anemia> on B12 IM Qmonth; she hasn't had a B12 shot in 35mo (?[facility]?) & level= 261; rec to start B12 shots monthly here...      We reviewed prob list, meds, xrays and labs> see below>> CXR 6/13 showed heart size at upper limit of normal; lungs clear, NAD.Marland KitchenMarland Kitchen EKG 6/13 showed SBrady, rate 53, WNL, NAD... LABS 6/13:  FLP- at goals on Prav40;  Chems- wnl;  CBC- ok w/ Hg=12.0;  TSH=1.72;  VitD=37  on 1000u/d;  B12=261... REC> incr VitD to 2000u/d;  Resume the monthly B12 shots 1041mcg/mo...   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 ~  CXR 10/10 showed norm heart size, ectasia & calcif of Ao, lungs clear, mild spondylosis & evid of prev neck surg... ~  CXR 6/13 showed heart size at upper limit of normal; lungs clear, NAD...  HYPERTENSION (ICD-401.9) - controlled on ATENOLOL 25mg /d & PRINIZIDE 10-12.5 daily... she was switched off Metoprolol & Diovan/Hct due to cost issues & to take [facility]... ~  2/12:  BP= 130/80 & she denies CP, palpit, SOB, edema, etc... ~  6/13:  BP= 118/70 & she remains largely asymptomatic...  Hx of CHEST PAIN, ATYPICAL >> related to her fibromyalgia, no recent  problems. PALPITATIONS >> see 03/22/12 above ~  EKG 6/13 showed SBrady, rate 53, WNL, NAD... ~  Holter monitor ==> pending  HYPERLIPIDEMIA (ICD-272.4) - prev controlled on Lipitor 20mg /d; she tried off statins but it didn't change her aches/pains and she restarted without additional problems... switched to PRAVASTATIN 40mg /d due to cost issues. ~  FLP in 2006 on Lip20 showed TChol 148, TG 94, HDL 38, LDL 91 ~  FLP 10/09 on Lip20 showed TChol 128, TG 126, HDL 34, LDL 68 ~  FLP 10/10 on Lip20 showed TChol 123, TG 68, HDL 39, LDL 70... change to PRAV40 for $$$ ~  FLP 2/12 on Prav40 showed TChol 169, TG 150, HDL 48, LDL 92 ~  FLP 6/13 on Prav40 showed TChol 163, TG 114, HDL 52, LDL 89   Hx of GASTRITIS (QMV-784.69) - last EGD was 6/07 by DrGessner showing gastritis and DU... neg HPylori... rec to stop NSAIDs, take PPI Rx = PRILOSEC 20mg /d OTC Rx...  IRRITABLE BOWEL SYNDROME (ICD-564.1) - she uses BENTYL 20mg  as needed... ~  last colonoscopy was 6/07 by DrGessner showing normal x hems... f/u planned 75yrs.  Hx of NECK PAIN (ICD-723.1) - s/p surgery by DrStern in 1999.  FIBROMYALGIA (ICD-729.1) - diagnosed by DrZieminski in the past...  on TRAMADOL 50mg  Prn (pt stopped prev Lyrica due to $$)... we discussed her syndrome, and she would be interested in seeing DrDeveshwar- seen 11/09: FM and DJD, given Tramadol...  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 rec to start Vit D 50,000 weekly but only took this for 1 month & switched to OTC Vit D 1000 u daily... ~  labs 2/12 showed Vit D level = 34, therefore rec incr 2000 u daily (she never did)... ~  Labs 6/13 showed Vit D level = 37, therefore rec incr to 2000u daily.  MIGRAINES, HX OF (ICD-V13.8)  ANXIETY (ICD-300.00) - on ZOLOFT 100mg - taking 1.5 tabs daily & ALPRAZOLAM 0.5mg  Prn (takes it Qhs)...under alot of stress: first w/ mother's illness, then w/ divorce, then w/ loss of job... [place]ringe benefit- she had lost 36# down to 168#  (10/10)> but regained the wt after she quit smoking!  PERNICIOUS ANEMIA (ICD-281.0) - Dx w/ low B12 level at [facility] office (151)... referred here for shots & getting monthly but she can't afford the monthly shots... review of records shows B12 shots ~every 3months & we will inquire into a less expensive alternative for the shots> [facility]... ~  labs 10/10 showed B12 level = 285 ~  labs 2/12 showed B12 level = 364... rec to get  B12 shot monthly. ~  Labs 6/13 showed B12 level = 261... [facility]; rec to start monthly shot thru our office.   Past Surgical History  Procedure Date  . Neck surgery 1999    Dr Venetia Maxon  . Total abdominal hysterectomy 04/2004    Bladder suspension  . Upper gastrointestinal endoscopy 6/07    gastritis, duodenal ulcer  . Colonoscopy 6/07    internal and external hemorrhoids  . Tubal ligation     Outpatient Encounter Prescriptions as of 03/22/2012  Medication Sig Dispense Refill  . ALPRAZolam (XANAX) 0.5 MG tablet Take 1/2-1 tablet by mouth 3 times a day as needed  90 tablet  3  . atenolol (TENORMIN) 25 MG tablet Take 1 tablet (25 mg total) by mouth daily.  90 tablet  3  . cholecalciferol (VITAMIN D) 1000 UNITS tablet Take 1,000 Units by mouth 2 (two) times daily.       . cyanocobalamin (,VITAMIN B-12,) 1000 MCG/ML injection Inject 1 mL (1,000 mcg total) into the muscle every 30 (thirty) days.  1 mL  11  . cyclobenzaprine (FLEXERIL) 5 MG tablet Take 1 tablet (5 mg total) by mouth 2 (two) times daily as needed for muscle spasms.  90 tablet  3  . dicyclomine (BENTYL) 20 MG tablet Take 1 tablet (20 mg total) by mouth every 6 (six) hours.  100 tablet  3  . lisinopril-hydrochlorothiazide (PRINZIDE,ZESTORETIC) 10-12.5 MG per tablet Take 1 tablet by mouth daily.  90 tablet  3  . meloxicam (MOBIC) 7.5 MG tablet Take 1 tablet (7.5 mg total) by mouth daily. As needed for arthritis inflammation  90  tablet  3  . omeprazole (PRILOSEC) 20 MG capsule Take 1 capsule (20 mg total) by mouth daily. Take 30 minutes before a meal  90 capsule  3  . pravastatin (PRAVACHOL) 40 MG tablet Take 1 tablet (40 mg total) by mouth daily.  90 tablet  3  . sertraline (ZOLOFT) 100 MG tablet Take 1 tablet (100 mg total) by mouth daily.  90 tablet  3  . traMADol (ULTRAM) 50 MG tablet Take 1 tablet (50 mg total) by mouth every 6 (six) hours as needed for pain.  100 tablet  4  . DISCONTD: ALPRAZolam (XANAX) 0.5 MG tablet Take 1/2-1 tablet by mouth 3 times a day as needed  30 tablet  0  . DISCONTD: atenolol (TENORMIN) 25 MG tablet TAKE ONE TABLET BY MOUTH EVERY DAY  90 tablet  0  . DISCONTD: cyanocobalamin (,VITAMIN B-12,) 1000 MCG/ML injection Inject 1,000 mcg into the muscle every 30 (thirty) days.        Marland Kitchen DISCONTD: cyanocobalamin (,VITAMIN B-12,) 1000 MCG/ML injection Inject 1 mL (1,000 mcg total) into the muscle every 30 (thirty) days.  1 mL  11  . DISCONTD: cyclobenzaprine (FLEXERIL) 5 MG tablet Take 1 tablet (5 mg total) by mouth 2 (two) times daily as needed for muscle spasms.  30 tablet  3  . DISCONTD: dicyclomine (BENTYL) 20 MG tablet Take 1 tablet (20 mg total) by mouth every 6 (six) hours.  50 tablet  1  . DISCONTD: lisinopril-hydrochlorothiazide (PRINZIDE,ZESTORETIC) 10-12.5 MG per tablet TAKE ONE TABLET BY MOUTH EVERY DAY  30 tablet  0  . DISCONTD: meloxicam (MOBIC) 7.5 MG tablet Take 7.5 mg by mouth daily. As needed for arthritis inflammation       . DISCONTD: omeprazole (PRILOSEC) 20 MG capsule Take 20 mg by mouth daily. Take 30  minutes before a meal       . DISCONTD: pravastatin (PRAVACHOL) 40 MG tablet TAKE ONE TABLET BY MOUTH EVERY DAY  90 tablet  0  . DISCONTD: sertraline (ZOLOFT) 100 MG tablet TAKE ONE & ONE-HALF TABLETS BY MOUTH EVERY DAY  45 tablet  0  . DISCONTD: traMADol (ULTRAM) 50 MG tablet TAKE ONE TABLET BY MOUTH EVERY 6 HOURS AS NEEDED FOR PAIN  100 tablet  0  . cyanocobalamin ((VITAMIN  B-12)) injection 1,000 mcg         Allergies  Allergen Reactions  . Morphine     REACTION: severe nausea/vomiting  . Sulfonamide Derivatives     REACTION: angioedema    Current Medications, Allergies, Past Medical History, Past Surgical History, Family History, and Social History were reviewed in [employer] record.   Review of Systems         See HPI - all other systems neg except as noted...  The patient denies anorexia, fever, weight loss, weight gain, vision loss, decreased hearing, hoarseness, chest pain, syncope, dyspnea on exertion, peripheral edema, prolonged cough, headaches, hemoptysis, abdominal pain, melena, hematochezia, severe indigestion/heartburn, hematuria, incontinence, muscle weakness, suspicious skin lesions, transient blindness, difficulty walking, depression, unusual weight change, abnormal bleeding, enlarged lymph nodes, and angioedema.     Objective:   Physical Exam     WD, Overweight, 66 y/o WF in NAD.Marland KitchenMarland Kitchen +diffuse triggers of FM. GENERAL:  Alert & oriented; pleasant & cooperative... HEENT:  [place]/AT, EOM-wnl, PERRLA, EACs-clear, TMs-wnl, NOSE-clear, THROAT-clear & wnl. NECK:  Scar of prev surg w/ fairROM; no JVD; normal carotid impulses w/o bruits; no thyromegaly or nodules palpated; no lymphadenopathy. CHEST:  Clear to P & A; without wheezes/ rales/ or rhonchi. HEART:  Regular Rhythm; without murmurs/ rubs/ or gallops. ABDOMEN:  Soft & nontender; normal bowel sounds; no organomegaly or masses detected. EXT: without deformities, mild arthritic changes; no varicose veins/ +venous insuffic/ no edema. NEURO:  CN's intact; motor testing normal; sensory testing normal; gait normal & balance OK. DERM:  No lesions noted; intertriginous rash under breast c/w skin fungus...  RADIOLOGY DATA:  Reviewed in the EPIC EMR & discussed w/ the patient...  LABORATORY DATA:  Reviewed in the EPIC EMR & discussed w/ the patient...   Assessment:       Ex-smoker>  She has remained off cigarettes, but has regained her weight; we discussed diet, exercise, & wt reduction strategies...  HBP>  Stable on 2 meds; continue same- reminded of the importance of no salt & wt reduction...  HxCP, palpit>  SEE ABOVE- we will order a Holter Monitor to assess her palpit...  Hyperlipid>  Stable on diet + Prav40, continue same but get wt down...  GI> Gastitis, IBS>  Continue Prilosec & use the Bentyl as needed...  DJD, FM, HxNeckPain>  Tramadol helps 7 he CC is knee pain; offered ortho referral & she will decide, & use knee brace when exercising...  Anxiety>  Doing satis on Zoloft & Xanax...  PA>  She had stopped the B12 shots 7 B12 level is low end; rec to restart B12 monthly via our office...     Plan:     Patient's Medications  New Prescriptions   No medications on file  Previous Medications   CHOLECALCIFEROL (VITAMIN D) 1000 UNITS TABLET    Take 1,000 Units by mouth 2 (two) times daily.   Modified Medications   Modified Medication Previous Medication   ALPRAZOLAM (XANAX) 0.5 MG TABLET ALPRAZolam (XANAX) 0.5  MG tablet      Take 1/2-1 tablet by mouth 3 times a day as needed    Take 1/2-1 tablet by mouth 3 times a day as needed   ATENOLOL (TENORMIN) 25 MG TABLET atenolol (TENORMIN) 25 MG tablet      Take 1 tablet (25 mg total) by mouth daily.    TAKE ONE TABLET BY MOUTH EVERY DAY   CYANOCOBALAMIN (,VITAMIN B-12,) 1000 MCG/ML INJECTION cyanocobalamin (,VITAMIN B-12,) 1000 MCG/ML injection      Inject 1 mL (1,000 mcg total) into the muscle every 30 (thirty) days.    Inject 1,000 mcg into the muscle every 30 (thirty) days.     CYCLOBENZAPRINE (FLEXERIL) 5 MG TABLET cyclobenzaprine (FLEXERIL) 5 MG tablet      Take 1 tablet (5 mg total) by mouth 2 (two) times daily as needed for muscle spasms.    Take 1 tablet (5 mg total) by mouth 2 (two) times daily as needed for muscle spasms.   DICYCLOMINE (BENTYL) 20 MG TABLET dicyclomine (BENTYL) 20 MG  tablet      Take 1 tablet (20 mg total) by mouth every 6 (six) hours.    Take 1 tablet (20 mg total) by mouth every 6 (six) hours.   LISINOPRIL-HYDROCHLOROTHIAZIDE (PRINZIDE,ZESTORETIC) 10-12.5 MG PER TABLET lisinopril-hydrochlorothiazide (PRINZIDE,ZESTORETIC) 10-12.5 MG per tablet      Take 1 tablet by mouth daily.    TAKE ONE TABLET BY MOUTH EVERY DAY   MELOXICAM (MOBIC) 7.5 MG TABLET meloxicam (MOBIC) 7.5 MG tablet      Take 1 tablet (7.5 mg total) by mouth daily. As needed for arthritis inflammation    Take 7.5 mg by mouth daily. As needed for arthritis inflammation    OMEPRAZOLE (PRILOSEC) 20 MG CAPSULE omeprazole (PRILOSEC) 20 MG capsule      Take 1 capsule (20 mg total) by mouth daily. Take 30 minutes before a meal    Take 20 mg by mouth daily. Take 30 minutes before a meal    PRAVASTATIN (PRAVACHOL) 40 MG TABLET pravastatin (PRAVACHOL) 40 MG tablet      Take 1 tablet (40 mg total) by mouth daily.    TAKE ONE TABLET BY MOUTH EVERY DAY   SERTRALINE (ZOLOFT) 100 MG TABLET sertraline (ZOLOFT) 100 MG tablet      Take 1 tablet (100 mg total) by mouth daily.    TAKE ONE & ONE-HALF TABLETS BY MOUTH EVERY DAY   TRAMADOL (ULTRAM) 50 MG TABLET traMADol (ULTRAM) 50 MG tablet      Take 1 tablet (50 mg total) by mouth every 6 (six) hours as needed for pain.    TAKE ONE TABLET BY MOUTH EVERY 6 HOURS AS NEEDED FOR PAIN  Discontinued Medications   No medications on file

## 2012-03-30 ENCOUNTER — Telehealth: Payer: Self-pay | Admitting: Pulmonary Disease

## 2012-03-30 NOTE — Telephone Encounter (Signed)
SN please advise if ok to call [facility] and give the ok to fill the tramadol for this pt.  Thanks  Allergies  Allergen Reactions  . Morphine     REACTION: severe nausea/vomiting  . Sulfonamide Derivatives     REACTION: angioedema

## 2012-03-30 NOTE — Telephone Encounter (Signed)
Per SN---ok to fill the tramadol for the pt per the rx directions. [facility] is aware and they will fill the meds for the pt.

## 2012-04-11 ENCOUNTER — Encounter (INDEPENDENT_AMBULATORY_CARE_PROVIDER_SITE_OTHER): Payer: Medicare Other

## 2012-04-11 DIAGNOSIS — R002 Palpitations: Secondary | ICD-10-CM

## 2012-04-11 DIAGNOSIS — R0789 Other chest pain: Secondary | ICD-10-CM

## 2012-04-12 ENCOUNTER — Telehealth: Payer: Self-pay

## 2012-04-12 NOTE — Telephone Encounter (Signed)
Pt calling with questions re monitor, pls call [phone] or [phone]

## 2012-04-18 ENCOUNTER — Other Ambulatory Visit: Payer: Self-pay | Admitting: Obstetrics and Gynecology

## 2012-04-18 ENCOUNTER — Other Ambulatory Visit: Payer: Self-pay | Admitting: Pulmonary Disease

## 2012-04-18 DIAGNOSIS — Z1231 Encounter for screening mammogram for malignant neoplasm of breast: Secondary | ICD-10-CM

## 2012-04-27 ENCOUNTER — Ambulatory Visit
Admission: RE | Admit: 2012-04-27 | Discharge: 2012-04-27 | Disposition: A | Discharge status: Home / Self Care | Payer: Medicare Other | Source: Ambulatory Visit | Attending: Pulmonary Disease | Admitting: Pulmonary Disease

## 2012-04-27 DIAGNOSIS — Z1231 Encounter for screening mammogram for malignant neoplasm of breast: Secondary | ICD-10-CM

## 2012-05-03 ENCOUNTER — Other Ambulatory Visit: Payer: Self-pay | Admitting: Pulmonary Disease

## 2012-05-03 DIAGNOSIS — R928 Other abnormal and inconclusive findings on diagnostic imaging of breast: Secondary | ICD-10-CM

## 2012-05-19 ENCOUNTER — Ambulatory Visit
Admission: RE | Admit: 2012-05-19 | Discharge: 2012-05-19 | Disposition: A | Discharge status: Home / Self Care | Payer: Medicare Other | Source: Ambulatory Visit | Attending: Pulmonary Disease | Admitting: Pulmonary Disease

## 2012-05-19 DIAGNOSIS — R928 Other abnormal and inconclusive findings on diagnostic imaging of breast: Secondary | ICD-10-CM

## 2012-07-14 ENCOUNTER — Other Ambulatory Visit: Payer: Self-pay | Admitting: Physician Assistant

## 2012-08-02 ENCOUNTER — Telehealth: Payer: Self-pay | Admitting: Pulmonary Disease

## 2012-08-02 NOTE — Telephone Encounter (Signed)
Forward 4 pages from [facility] to Dr. Alroy Dust for review on 08-02-29

## 2012-10-04 HISTORY — PX: HEMORRHOID BANDING: SHX5850

## 2012-10-24 ENCOUNTER — Telehealth: Payer: Self-pay | Admitting: Pulmonary Disease

## 2012-10-24 MED ORDER — CIPROFLOXACIN HCL 250 MG PO TABS: 250.0000 mg | ORAL_TABLET | Freq: Two times a day (BID) | ORAL | Status: DC

## 2012-10-24 NOTE — Telephone Encounter (Signed)
i spoke with pt. C/o UTI symptoms. Burning sensation, frequent urination, and pressure. She has been taking AZO w/o relief. Pt states she can't drop off specimen pt she is "sitting" for another lady. Requesting to have something called in. Please advise thanks Last OV 03/22/12 No pending appt Allergies  Allergen Reactions  . Morphine     REACTION: severe nausea/vomiting  . Sulfonamide Derivatives     REACTION: angioedema

## 2012-10-24 NOTE — Telephone Encounter (Signed)
Pt aware of SN recs. She voiced her understanding and needed nothing further. rx has been sent

## 2012-10-24 NOTE — Telephone Encounter (Signed)
Per SN---ok to call in cipro 250 mg  #14  1 po bid.  thanks

## 2012-11-22 ENCOUNTER — Telehealth: Payer: Self-pay | Admitting: Internal Medicine

## 2012-11-22 NOTE — Telephone Encounter (Signed)
Spoke with pt She is asking for copy of all of her rxs for b-12  I asked her to call the pharmacy for this so they can give her a print out of all the dates she had it filled there She states that she will do this, but she needs for SN to write a letter explaining why she needs the b-12 Wants to pick this up when she comes in on Monday Please advise thanks!

## 2012-11-24 ENCOUNTER — Encounter: Payer: Self-pay | Admitting: *Deleted

## 2012-11-24 ENCOUNTER — Telehealth: Payer: Self-pay | Admitting: Pulmonary Disease

## 2012-11-24 MED ORDER — AMOXICILLIN-POT CLAVULANATE 875-125 MG PO TABS: 1.0000 | ORAL_TABLET | Freq: Two times a day (BID) | ORAL | Status: DC

## 2012-11-24 NOTE — Telephone Encounter (Signed)
Letter has been completed for the pt and she will pick this up Monday.

## 2012-11-24 NOTE — Telephone Encounter (Signed)
Spoke with pt and notified of recs per SN She verbalized understanding and states nothing further needed Rx was sent to pharm

## 2012-11-24 NOTE — Telephone Encounter (Signed)
Per SN---  1.  Call in augmentin 875 mg   #14  1 po bid 2.  Align once daily 3.  mucinex otc 600 mg  2 po bid 4.  Nasal saline spray every 1-2 hours while awake.

## 2012-11-24 NOTE — Telephone Encounter (Signed)
Spoke with pt  She states having sinus pressure and congestion x 3 days Has started coughing this am, non prod "but sounds croupy" She denies any wheezing, SOB, CP, f/c/s Would like to have something called in Please advise thanks! Allergies  Allergen Reactions  . Morphine     REACTION: severe nausea/vomiting  . Sulfonamide Derivatives     REACTION: angioedema   Last ov 03/22/12 Next ov not yet pending

## 2012-12-01 ENCOUNTER — Ambulatory Visit (INDEPENDENT_AMBULATORY_CARE_PROVIDER_SITE_OTHER): Payer: Medicare Other

## 2012-12-01 DIAGNOSIS — D649 Anemia, unspecified: Secondary | ICD-10-CM

## 2012-12-01 MED ORDER — CYANOCOBALAMIN 1000 MCG/ML IJ SOLN
1000.0000 ug | Freq: Once | INTRAMUSCULAR | Status: AC
  Administered 2012-12-01: 1000 ug via INTRAMUSCULAR

## 2012-12-20 ENCOUNTER — Telehealth: Payer: Self-pay | Admitting: Pulmonary Disease

## 2012-12-20 MED ORDER — ALPRAZOLAM 0.5 MG PO TABS: ORAL_TABLET | ORAL | Status: AC

## 2012-12-20 NOTE — Telephone Encounter (Signed)
I spoke with pt. She is aware rx will be sent. Rx printed and will fax to [facility]. Nothing further was needed

## 2012-12-27 ENCOUNTER — Telehealth: Payer: Self-pay | Admitting: Pulmonary Disease

## 2012-12-27 NOTE — Telephone Encounter (Signed)
I spoke with pt. She stated she was calling in regards to her alprazolam. They did receive her RX but they needed [place] to call and confirm okay to send RX out to her.   I called [facility] to confirm this. Alda Ponder stated this will be shipped out to pt.   I called and made pt aware pf this. Nothing further was needed

## 2013-02-05 ENCOUNTER — Ambulatory Visit (INDEPENDENT_AMBULATORY_CARE_PROVIDER_SITE_OTHER): Payer: Medicare Other | Admitting: Pulmonary Disease

## 2013-02-05 ENCOUNTER — Ambulatory Visit (INDEPENDENT_AMBULATORY_CARE_PROVIDER_SITE_OTHER): Payer: Medicare Other

## 2013-02-05 ENCOUNTER — Encounter: Payer: Self-pay | Admitting: Pulmonary Disease

## 2013-02-05 VITALS — BP 102/70 | HR 63 | Temp 97.8°F | Ht 66.0 in | Wt 190.2 lb

## 2013-02-05 DIAGNOSIS — K2970 Gastritis, unspecified, without bleeding: Secondary | ICD-10-CM

## 2013-02-05 DIAGNOSIS — M19049 Primary osteoarthritis, unspecified hand: Secondary | ICD-10-CM | POA: Insufficient documentation

## 2013-02-05 DIAGNOSIS — M25549 Pain in joints of unspecified hand: Secondary | ICD-10-CM

## 2013-02-05 DIAGNOSIS — IMO0001 Reserved for inherently not codable concepts without codable children: Secondary | ICD-10-CM

## 2013-02-05 DIAGNOSIS — K2990 Gastroduodenitis, unspecified, without bleeding: Secondary | ICD-10-CM

## 2013-02-05 DIAGNOSIS — I10 Essential (primary) hypertension: Secondary | ICD-10-CM

## 2013-02-05 DIAGNOSIS — I872 Venous insufficiency (chronic) (peripheral): Secondary | ICD-10-CM

## 2013-02-05 DIAGNOSIS — M79643 Pain in unspecified hand: Secondary | ICD-10-CM

## 2013-02-05 DIAGNOSIS — D510 Vitamin B12 deficiency anemia due to intrinsic factor deficiency: Secondary | ICD-10-CM

## 2013-02-05 DIAGNOSIS — D649 Anemia, unspecified: Secondary | ICD-10-CM

## 2013-02-05 DIAGNOSIS — E785 Hyperlipidemia, unspecified: Secondary | ICD-10-CM

## 2013-02-05 DIAGNOSIS — F411 Generalized anxiety disorder: Secondary | ICD-10-CM

## 2013-02-05 DIAGNOSIS — K589 Irritable bowel syndrome without diarrhea: Secondary | ICD-10-CM

## 2013-02-05 DIAGNOSIS — E559 Vitamin D deficiency, unspecified: Secondary | ICD-10-CM

## 2013-02-05 HISTORY — DX: Primary osteoarthritis, unspecified hand: M19.049

## 2013-02-05 NOTE — Progress Notes (Signed)
Subjective:     Patient ID: [name] [name], female   DOB: 1946-06-09, 67 y.o.   MRN: [id number]  HPI 67 y/o WF ([name] [name]'s mother) here for a follow up visit and review of her medical issues...   ~  November 16, 2010:    BP meds have been adjusted over the yr & currently on Aten25 + Prinizide 10-12.5 w/ BP 130/80 & similar at home she says despite 21# wt gain since 2010; we reviewed low sodium restriction...    She has tolerated the Prav40 very well & due for FLP to see if it is working as well as her Lip20 did previously;  we reviewed low chol/ low fat diet & need to incr exerc & get wt back down...    She has mod severe FM & some OA as well w/ c/o right index finger pain & decr ROM;  we discussed hot soaks & trial MOBIC 7.5 +asking [name] [name] to eval (she will call- relative works there)...    [facility] ("[facility]") and on Vit D orally> labs pending today;  stable on Sertraline & Alprazolam, and she tells me she quit smoking 11/10!!!  ~  March 22, 2012:  716mo ROV & [name] [name] is now 30 & covered by Medicare; she denies CP but has occas palpit- off & on for the last month or so; skipping sensation "turns over" and takes her breath away; it occurs on most days & she notes that Xanax helps; we discussed checking Holter monitor to "catch" the arrhythmia & Rx w/ no caffeine etc for now...    Ex-smoker> she quit smoking in 2010 & is still not smoking, denies resp symptoms...    HBP> on Aten25, LisinHCT10-12.5; BP= 118/70 & she denies CP, SOB, edema; +palpit as above...    Chol> on Prav40; FLP showed TChol 163, TG 114, HDL 52, LDL 89; continue same...    GI> HxGastritis, IBS> on Prilosec & Bentyl20; she is doing well & denies abd pain, n/v, c/d, blood seen, etc...    Ortho> DJD, HxNeckPain, FM> on Tramadol, Flexeril5mg , & VitD 1000u/d; she has some knee discomfort & uses tramadol prn; advised to get wt down, use knee brace w/ exercise...    Anxiety> on Zoloft100-1.5tabs/d &  Xanax0.5; she uses the Alpraz Qhs for sleep...    Penicious Anemia> on B12 IM Qmonth; she hasn't had a B12 shot in 16mo (?[facility] & level= 261; rec to start B12 shots monthly here...  We reviewed prob list, meds, xrays and labs> see below>> CXR 6/13 showed heart size at upper limit of normal; lungs clear, NAD.Marland KitchenMarland Kitchen EKG 6/13 showed SBrady, rate 53, WNL, NAD... LABS 6/13:  FLP- at goals on Prav40;  Chems- wnl;  CBC- ok w/ Hg=12.0;  TSH=1.72;  VitD=37 on 1000u/d;  B12=261... REC> incr VitD to 2000u/d;  Resume the monthly B12 shots 1059mcg/mo...   ~  Feb 05, 2013:  41mo ROV & [name] reports a good yr- just c/o some cramps in her legs & feet;  We reviewed the following medical problems during today's office visit >>     Ex-smoker> she quit smoking in 2010 & is still not smoking, denies resp symptoms...    HBP> on Aten25, LisinHCT10-12.5; BP= 102/70 & she denies CP, SOB, edema; +palpit as above...    Chol> on Prav40; FLP showed TChol 156, TG 132, HDL 42, LDL 88; continue same...    GI>  HxGastritis, IBS> on Prilosec & Bentyl20; she is doing well, notes incr IBS symptoms w/ stress, & denies current abd pain, n/v, c/d, blood seen, etc...    Ortho> DJD, HxNeckPain, FM> on Tramadol, Mobic7.5, Flexeril5mg , & VitD 1000u/d; she has some knee & foot discomfort; advised to get wt down, use knee brace w/ exercise...    Anxiety> on Zoloft100 & Xanax0.5; she uses the Alpraz Qhs for sleep...    Penicious Anemia> on B12 IM Qmonth; she hasn't had a B12 shot in 76mo (?[facility]?) & level= 261; rec to start B12 shots monthly here... We reviewed prob list, meds, xrays and labs> see below for updates >>  LABS 5/14:  FLP- at goals on Prav40;  Chems- wnl;  CBC- ok x Hg=11.6;  TSH=1.61...          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 ~  CXR 10/10 showed norm heart size, ectasia & calcif of Ao, lungs  clear, mild spondylosis & evid of prev neck surg... ~  CXR 6/13 showed heart size at upper limit of normal; lungs clear, NAD...  HYPERTENSION (ICD-401.9) - controlled on ATENOLOL & PRINIZIDE... she was switched off Metoprolol & Diovan/Hct due to cost issues & to [facility]... ~  2/12:  BP= 130/80 & she denies CP, palpit, SOB, edema, etc... ~  6/13:  on Aten25, LisinHCT10-12.5; BP= 118/70 & she denies CP, SOB, edema; +palpit as above... ~  5/14:  on Aten25, LisinHCT10-12.5; BP= 102/70 & she denies CP, SOB, edema; +palpit as above  Hx of CHEST PAIN, ATYPICAL >> related to her fibromyalgia, no recent problems. PALPITATIONS >> see 03/22/12 above ~  EKG 6/13 showed SBrady, rate 53, WNL, NAD... ~  Holter monitor ==> pending  HYPERLIPIDEMIA (ICD-272.4) - prev controlled on Lipitor 20mg /d; she tried off statins but it didn't change her aches/pains and she restarted without additional problems... switched to PRAVASTATIN 40mg /d due to cost issues. ~  FLP in 2006 on Lip20 showed TChol 148, TG 94, HDL 38, LDL 91 ~  FLP 10/09 on Lip20 showed TChol 128, TG 126, HDL 34, LDL 68 ~  FLP 10/10 on Lip20 showed TChol 123, TG 68, HDL 39, LDL 70... change to PRAV40 for $$$ ~  FLP 2/12 on Prav40 showed TChol 169, TG 150, HDL 48, LDL 92 ~  FLP 6/13 on Prav40 showed TChol 163, TG 114, HDL 52, LDL 89  ~  FLP 5/14 on Prav40 showed TChol 156, TG 132, HDL 42, LDL 88   Hx of GASTRITIS (ZOX-096.04) - last EGD was 6/07 by [name] [name] showing gastritis and DU... neg HPylori... rec to stop NSAIDs, take PPI Rx = PRILOSEC 20mg /d OTC Rx...  IRRITABLE BOWEL SYNDROME (ICD-564.1) - she uses BENTYL 20mg  as needed... ~  last colonoscopy was 6/07 by [name] [name] showing normal x hems... f/u planned 17yrs.  Hx of NECK PAIN (ICD-723.1) - s/p surgery by [name] [name] in 1999.  FIBROMYALGIA (ICD-729.1) - diagnosed by [name] [name] in the past...  on TRAMADOL 50mg  Prn (pt stopped prev Lyrica due to $$)... we discussed her  syndrome, and she would be interested in seeing [name] [name]- seen 11/09: FM and DJD, given Tramadol...  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 rec to start Vit D 50,000 weekly but only took this for 1 month & switched to OTC Vit D 1000 u daily... ~  labs 2/12 showed Vit D level = 34, therefore rec incr 2000 u daily (she never did)... ~  Labs 6/13 showed Vit D level = 37, therefore rec incr to 2000u daily.  MIGRAINES, HX OF (ICD-V13.8)  ANXIETY (ICD-300.00) - on ZOLOFT 100mg - taking 1.5 tabs daily & ALPRAZOLAM 0.5mg  Prn (takes it Qhs)...under alot of stress: first w/ mother's illness, then w/ divorce, then w/ loss of job... [employer] benefit- she had lost 36# down to 168# (10/10)> but regained the wt after she quit smoking!  PERNICIOUS ANEMIA (ICD-281.0) - Dx w/ low B12 level at [facility] office (151)... referred here for shots & getting monthly but she can't afford the monthly shots... review of records shows B12 shots ~every 3months & we will inquire into a less expensive alternative for the shots> [facility]... ~  labs 10/10 showed B12 level = 285 ~  labs 2/12 showed B12 level = 364... rec to get B12 shot monthly. ~  Labs 6/13 showed B12 level = 261... [facility]; rec to start monthly shot thru our office. ~  Labs 5/14 on B12 shots monthly showed Hg= 11.6, MCV=88, B12- not done; needs to continue B12 shots monthly & add Fe325mg /d...   Past Surgical History  Procedure Laterality Date  . Neck surgery  1999    Dr Venetia Maxon  . Total abdominal hysterectomy  04/2004    Bladder suspension  . Upper gastrointestinal endoscopy  6/07    gastritis, duodenal ulcer  . Colonoscopy  6/07    internal and external hemorrhoids  . Tubal ligation      Outpatient Encounter Prescriptions as of 02/05/2013  Medication Sig Dispense Refill  . ALPRAZolam (XANAX) 0.5 MG tablet Take 1/2-1 tablet by mouth 3 times a day as  needed  90 tablet  3  . atenolol (TENORMIN) 25 MG tablet Take 1 tablet (25 mg total) by mouth daily.  90 tablet  3  . cholecalciferol (VITAMIN D) 1000 UNITS tablet Take 1,000 Units by mouth 2 (two) times daily.       . cyanocobalamin (,VITAMIN B-12,) 1000 MCG/ML injection Inject 1 mL (1,000 mcg total) into the muscle every 30 (thirty) days.  1 mL  11  . cyclobenzaprine (FLEXERIL) 5 MG tablet Take 1 tablet (5 mg total) by mouth 2 (two) times daily as needed for muscle spasms.  90 tablet  3  . dicyclomine (BENTYL) 20 MG tablet Take 1 tablet (20 mg total) by mouth every 6 (six) hours.  100 tablet  3  . lisinopril-hydrochlorothiazide (PRINZIDE,ZESTORETIC) 10-12.5 MG per tablet Take 1 tablet by mouth daily.  90 tablet  3  . meloxicam (MOBIC) 7.5 MG tablet Take 1 tablet (7.5 mg total) by mouth daily. As needed for arthritis inflammation  90 tablet  3  . omeprazole (PRILOSEC) 20 MG capsule Take 1 capsule (20 mg total) by mouth daily. Take 30 minutes before a meal  90 capsule  3  . pravastatin (PRAVACHOL) 40 MG tablet Take 1 tablet (40 mg total) by mouth daily.  90 tablet  3  . sertraline (ZOLOFT) 100 MG tablet Take 1 tablet (100 mg total) by mouth daily.  90 tablet  3  . traMADol (ULTRAM) 50 MG tablet Take 1 tablet (50 mg total) by mouth every 6 (six) hours as needed for pain.  100 tablet  4  . [DISCONTINUED] amoxicillin-clavulanate (AUGMENTIN) 875-125 MG per tablet Take 1 tablet by mouth  2 (two) times daily.  14 tablet  0  . [DISCONTINUED] ciprofloxacin (CIPRO) 250 MG tablet Take 1 tablet (250 mg total) by mouth 2 (two) times daily.  14 tablet  0   No facility-administered encounter medications on file as of 02/05/2013.    Allergies  Allergen Reactions  . Morphine     REACTION: severe nausea/vomiting  . Sulfonamide Derivatives     REACTION: angioedema    Current Medications, Allergies, Past Medical History, Past Surgical History, Family History, and Social History were reviewed in [employer] record.   Review of Systems         See HPI - all other systems neg except as noted...  The patient denies anorexia, fever, weight loss, weight gain, vision loss, decreased hearing, hoarseness, chest pain, syncope, dyspnea on exertion, peripheral edema, prolonged cough, headaches, hemoptysis, abdominal pain, melena, hematochezia, severe indigestion/heartburn, hematuria, incontinence, muscle weakness, suspicious skin lesions, transient blindness, difficulty walking, depression, unusual weight change, abnormal bleeding, enlarged lymph nodes, and angioedema.     Objective:   Physical Exam     WD, Overweight, 67 y/o WF in NAD.Marland KitchenMarland Kitchen +diffuse triggers of FM. GENERAL:  Alert & oriented; pleasant & cooperative... HEENT:  [employer]/AT, EOM-wnl, PERRLA, EACs-clear, TMs-wnl, NOSE-clear, THROAT-clear & wnl. NECK:  Scar of prev surg w/ fairROM; no JVD; normal carotid impulses w/o bruits; no thyromegaly or nodules palpated; no lymphadenopathy. CHEST:  Clear to P & A; without wheezes/ rales/ or rhonchi. HEART:  Regular Rhythm; without murmurs/ rubs/ or gallops. ABDOMEN:  Soft & nontender; normal bowel sounds; no organomegaly or masses detected. EXT: without deformities, mild arthritic changes; no varicose veins/ +venous insuffic/ no edema. NEURO:  CN's intact; motor testing normal; sensory testing normal; gait normal & balance OK. DERM:  No lesions noted; intertriginous rash under breast c/w skin fungus...  RADIOLOGY DATA:  Reviewed in the EPIC EMR & discussed w/ the patient...  LABORATORY DATA:  Reviewed in the EPIC EMR & discussed w/ the patient...   Assessment:      Ex-smoker>  She has remained off cigarettes, but has regained her weight; we discussed diet, exercise, & wt reduction strategies...  HBP>  Stable on 2 meds; continue same- reminded of the importance of no salt & wt reduction...  HxCP, palpit>  SEE ABOVE- we will order a Holter Monitor to assess her palpit...  Hyperlipid>   Stable on diet + Prav40, continue same but get wt down...  GI> Gastitis, IBS>  Continue Prilosec & use the Bentyl as needed...  DJD, FM, HxNeckPain>  Tramadol helps & he CC is knee pain; offered ortho referral & she will decide, & use knee brace when exercising...  Anxiety>  Doing satis on Zoloft & Xanax...  PA>  She had stopped the B12 shots 7 B12 level is low end; rec to restart B12 monthly via our office...     Plan:     Patient's Medications  New Prescriptions   No medications on file  Previous Medications   ALPRAZOLAM (XANAX) 0.5 MG TABLET    Take 1/2-1 tablet by mouth 3 times a day as needed   CALCIUM CARBONATE (OS-CAL) 600 MG TABS    Take 600 mg by mouth daily.   CHOLECALCIFEROL (VITAMIN D) 1000 UNITS TABLET    Take 1,000 Units by mouth 2 (two) times daily.    CYANOCOBALAMIN (,VITAMIN B-12,) 1000 MCG/ML INJECTION    Inject 1 mL (1,000 mcg total) into the muscle every 30 (thirty) days.  DICYCLOMINE (BENTYL) 20 MG TABLET    Take 1 tablet (20 mg total) by mouth every 6 (six) hours.   MULTIPLE VITAMINS-MINERALS (WOMENS MULTIVITAMIN PLUS PO)    Take 1 tablet by mouth daily.  Modified Medications   Modified Medication Previous Medication   ATENOLOL (TENORMIN) 25 MG TABLET atenolol (TENORMIN) 25 MG tablet      Take 1 tablet (25 mg total) by mouth daily.    Take 1 tablet (25 mg total) by mouth daily.   CYCLOBENZAPRINE (FLEXERIL) 5 MG TABLET cyclobenzaprine (FLEXERIL) 5 MG tablet      Take 1 tablet (5 mg total) by mouth 2 (two) times daily as needed for muscle spasms.    Take 1 tablet (5 mg total) by mouth 2 (two) times daily as needed for muscle spasms.   DIPHENHYD-HYDROCORT-NYSTATIN (FIRST-DUKES MOUTHWASH) SUSP Diphenhyd-Hydrocort-Nystatin (FIRST-DUKES MOUTHWASH) SUSP      Take 1 tsp qid swish and swallow.    Take 1 tsp qid swish and swallow.   LISINOPRIL-HYDROCHLOROTHIAZIDE (PRINZIDE,ZESTORETIC) 10-12.5 MG PER TABLET lisinopril-hydrochlorothiazide (PRINZIDE,ZESTORETIC)  10-12.5 MG per tablet      Take 1 tablet by mouth daily.    Take 1 tablet by mouth daily.   MELOXICAM (MOBIC) 7.5 MG TABLET meloxicam (MOBIC) 7.5 MG tablet      Take 1 tablet (7.5 mg total) by mouth daily. As needed for arthritis inflammation    Take 1 tablet (7.5 mg total) by mouth daily. As needed for arthritis inflammation   OMEPRAZOLE (PRILOSEC) 20 MG CAPSULE omeprazole (PRILOSEC) 20 MG capsule      Take 1 capsule (20 mg total) by mouth daily. Take 30 minutes before a meal    Take 1 capsule (20 mg total) by mouth daily. Take 30 minutes before a meal   PRAVASTATIN (PRAVACHOL) 40 MG TABLET pravastatin (PRAVACHOL) 40 MG tablet      Take 1 tablet (40 mg total) by mouth daily.    Take 1 tablet (40 mg total) by mouth daily.   SERTRALINE (ZOLOFT) 100 MG TABLET sertraline (ZOLOFT) 100 MG tablet      Take 1 tablet (100 mg total) by mouth daily.    Take 1 tablet (100 mg total) by mouth daily.   TRAMADOL (ULTRAM) 50 MG TABLET traMADol (ULTRAM) 50 MG tablet      Take 1 tablet (50 mg total) by mouth every 6 (six) hours as needed for pain.    Take 1 tablet (50 mg total) by mouth every 6 (six) hours as needed for pain.  Discontinued Medications   AMOXICILLIN-CLAVULANATE (AUGMENTIN) 875-125 MG PER TABLET    Take 1 tablet by mouth 2 (two) times daily.   CIPROFLOXACIN (CIPRO) 250 MG TABLET    Take 1 tablet (250 mg total) by mouth 2 (two) times daily.   SERTRALINE (ZOLOFT) 100 MG TABLET    Take 1 tablet (100 mg total) by mouth daily.   TRAMADOL (ULTRAM) 50 MG TABLET    Take 1 tablet (50 mg total) by mouth every 6 (six) hours as needed for pain.

## 2013-02-05 NOTE — Patient Instructions (Addendum)
Today we updated your med list in our EPIC system...    Continue your current medications the same...    We refilled your meds per request...  Please return to our lab one morning this week for your follow up FASTING blood work...    We will contact you w/ the results when available...   We will also sched a Bone Density Test for you & let you know the results once it's been read...  Keep up the good work w/ diet & exercise, work on wt reduction...  Call for any questions...  Let's plan a follow up visit in 71yr, sooner if needed for problems.Marland KitchenMarland Kitchen

## 2013-02-06 MED ORDER — CYANOCOBALAMIN 1000 MCG/ML IJ SOLN
1000.0000 ug | Freq: Once | INTRAMUSCULAR | Status: AC
  Administered 2013-02-06: 1000 ug via INTRAMUSCULAR

## 2013-02-08 ENCOUNTER — Other Ambulatory Visit (INDEPENDENT_AMBULATORY_CARE_PROVIDER_SITE_OTHER): Payer: Medicare Other

## 2013-02-08 ENCOUNTER — Ambulatory Visit (INDEPENDENT_AMBULATORY_CARE_PROVIDER_SITE_OTHER)
Admission: RE | Admit: 2013-02-08 | Discharge: 2013-02-08 | Disposition: A | Discharge status: Home / Self Care | Payer: Medicare Other | Source: Ambulatory Visit | Attending: Pulmonary Disease | Admitting: Pulmonary Disease

## 2013-02-08 DIAGNOSIS — D510 Vitamin B12 deficiency anemia due to intrinsic factor deficiency: Secondary | ICD-10-CM

## 2013-02-08 DIAGNOSIS — E785 Hyperlipidemia, unspecified: Secondary | ICD-10-CM

## 2013-02-08 DIAGNOSIS — E559 Vitamin D deficiency, unspecified: Secondary | ICD-10-CM

## 2013-02-08 DIAGNOSIS — I10 Essential (primary) hypertension: Secondary | ICD-10-CM

## 2013-02-08 LAB — HEPATIC FUNCTION PANEL
Albumin: 4.1 g/dL (ref 3.5–5.2)
Alkaline Phosphatase: 58 U/L (ref 39–117)
Total Bilirubin: 0.7 mg/dL (ref 0.3–1.2)

## 2013-02-08 LAB — LIPID PANEL
HDL: 42.1 mg/dL (ref >39.00)
LDL Cholesterol: 88 mg/dL (ref 0–99)
VLDL: 26.4 mg/dL (ref 0.0–40.0)

## 2013-02-08 LAB — CBC WITH DIFFERENTIAL/PLATELET
Eosinophils Relative: 2.9 % (ref 0.0–5.0)
HCT: 34.8 % — ABNORMAL LOW (ref 36.0–46.0)
Hemoglobin: 11.6 g/dL — ABNORMAL LOW (ref 12.0–15.0)
Lymphs Abs: 2.4 10*3/uL (ref 0.7–4.0)
MCV: 88 fl (ref 78.0–100.0)
Monocytes Relative: 6.9 % (ref 3.0–12.0)
Neutro Abs: 4.1 10*3/uL (ref 1.4–7.7)
WBC: 7.3 10*3/uL (ref 4.5–10.5)

## 2013-02-08 LAB — BASIC METABOLIC PANEL
BUN: 28 mg/dL — ABNORMAL HIGH (ref 6–23)
Calcium: 9.3 mg/dL (ref 8.4–10.5)
GFR: 54.55 mL/min — ABNORMAL LOW (ref >60.00)
Glucose, Bld: 89 mg/dL (ref 70–99)
Sodium: 140 mEq/L (ref 135–145)

## 2013-02-19 ENCOUNTER — Telehealth: Payer: Self-pay | Admitting: Pulmonary Disease

## 2013-02-19 ENCOUNTER — Telehealth: Payer: Self-pay | Admitting: Internal Medicine

## 2013-02-19 NOTE — Telephone Encounter (Signed)
I spoke with pt. She stated she sent in renewals for all her medications that go through [facility]. She stated this is just FYI for [place]. Nothing further was needed

## 2013-02-19 NOTE — Telephone Encounter (Signed)
Error.[name] [name] ° °

## 2013-02-23 ENCOUNTER — Telehealth: Payer: Self-pay | Admitting: Pulmonary Disease

## 2013-02-23 MED ORDER — PRAVASTATIN SODIUM 40 MG PO TABS: 40.0000 mg | ORAL_TABLET | Freq: Every day | ORAL | Status: DC

## 2013-02-23 MED ORDER — LISINOPRIL-HYDROCHLOROTHIAZIDE 10-12.5 MG PO TABS: 1.0000 | ORAL_TABLET | Freq: Every day | ORAL | Status: DC

## 2013-02-23 MED ORDER — OMEPRAZOLE 20 MG PO CPDR: 20.0000 mg | DELAYED_RELEASE_CAPSULE | Freq: Every day | ORAL | Status: DC

## 2013-02-23 MED ORDER — ATENOLOL 25 MG PO TABS: 25.0000 mg | ORAL_TABLET | Freq: Every day | ORAL | Status: DC

## 2013-02-23 MED ORDER — SERTRALINE HCL 100 MG PO TABS: 100.0000 mg | ORAL_TABLET | Freq: Every day | ORAL | Status: DC

## 2013-02-23 MED ORDER — TRAMADOL HCL 50 MG PO TABS: 50.0000 mg | ORAL_TABLET | Freq: Four times a day (QID) | ORAL | Status: DC | PRN

## 2013-02-23 MED ORDER — CYCLOBENZAPRINE HCL 5 MG PO TABS: 5.0000 mg | ORAL_TABLET | Freq: Two times a day (BID) | ORAL | Status: AC | PRN

## 2013-02-23 NOTE — Telephone Encounter (Signed)
I spoke with pt and she stated she needed her meds sent to [facility]. I have done so. She also needed the zoloft and tramadol sent to [facility] in [place] as she is completely out. I have done so. Nothing further was needed

## 2013-03-12 ENCOUNTER — Telehealth: Payer: Self-pay | Admitting: Pulmonary Disease

## 2013-03-12 NOTE — Telephone Encounter (Signed)
Spoke with patient, she states [facility] is needing "more information from our office on patients medication"  I have spoken w Deserd at [facility], she stated that no more additional info is needed they just need patients verbal consent to ship off the rmiedications  I spoke back with patient about this and she verbalized understanding and nothing further needed at this time

## 2013-03-21 ENCOUNTER — Other Ambulatory Visit: Payer: Self-pay | Admitting: Pulmonary Disease

## 2013-03-21 MED ORDER — MELOXICAM 7.5 MG PO TABS: 7.5000 mg | ORAL_TABLET | Freq: Every day | ORAL | Status: DC

## 2013-03-23 ENCOUNTER — Telehealth: Payer: Self-pay | Admitting: Pulmonary Disease

## 2013-03-23 MED ORDER — TRAMADOL HCL 50 MG PO TABS: 50.0000 mg | ORAL_TABLET | Freq: Four times a day (QID) | ORAL | Status: DC | PRN

## 2013-03-23 NOTE — Telephone Encounter (Signed)
Refill request received to send rx for tramadol to [facility]. rx sent 0 refills. Carron Curie, CMA

## 2013-03-26 ENCOUNTER — Telehealth: Payer: Self-pay | Admitting: Pulmonary Disease

## 2013-03-26 MED ORDER — FIRST-DUKES MOUTHWASH MT SUSP: OROMUCOSAL | Status: DC

## 2013-03-26 MED ORDER — AMOXICILLIN-POT CLAVULANATE 875-125 MG PO TABS: 1.0000 | ORAL_TABLET | Freq: Two times a day (BID) | ORAL | Status: DC

## 2013-03-26 NOTE — Telephone Encounter (Signed)
Per SN--  augmentin 875 mg  #14  1 po bid MMW  #4oz  1 tsp gargle and swallow QID Align once daily mucinex 1-2 po bid  OTC Increase fluids Nasal saline spray prn

## 2013-03-26 NOTE — Telephone Encounter (Signed)
Pt sinus drainage ,sore throat, denies any cough,chest congestion ,fever would [facility] [address]  Allergies  Allergen Reactions  . Morphine     REACTION: severe nausea/vomiting  . Sulfonamide Derivatives     REACTION: angioedema   Dr Kriste Basque please advise Thank you

## 2013-03-26 NOTE — Telephone Encounter (Signed)
.  pt aware of sn Rec's rx's sent nothing further needed.

## 2013-03-27 ENCOUNTER — Telehealth: Payer: Self-pay | Admitting: Pulmonary Disease

## 2013-03-27 MED ORDER — FIRST-DUKES MOUTHWASH MT SUSP: OROMUCOSAL | Status: DC

## 2013-03-27 MED ORDER — AMOXICILLIN-POT CLAVULANATE 875-125 MG PO TABS: 1.0000 | ORAL_TABLET | Freq: Two times a day (BID) | ORAL | Status: AC

## 2013-03-27 NOTE — Telephone Encounter (Signed)
Pt stated that her meds were sent in to the mail order pharmacy.  Pt was instructed to use nasal saline spray, mucinex, allegra, benadryl or zyrtec through the night and we [facility] these meds [facility] [place].  Pt is aware and nothing further is needed.

## 2013-04-02 ENCOUNTER — Ambulatory Visit (INDEPENDENT_AMBULATORY_CARE_PROVIDER_SITE_OTHER): Payer: Medicare Other

## 2013-04-02 DIAGNOSIS — D510 Vitamin B12 deficiency anemia due to intrinsic factor deficiency: Secondary | ICD-10-CM

## 2013-04-04 MED ORDER — CYANOCOBALAMIN 1000 MCG/ML IJ SOLN
1000.0000 ug | Freq: Once | INTRAMUSCULAR | Status: AC
  Administered 2013-04-04: 1000 ug via INTRAMUSCULAR

## 2013-04-30 ENCOUNTER — Telehealth: Payer: Self-pay | Admitting: Pulmonary Disease

## 2013-04-30 DIAGNOSIS — N159 Renal tubulo-interstitial disease, unspecified: Secondary | ICD-10-CM

## 2013-04-30 NOTE — Telephone Encounter (Signed)
Per SN---   Ok to refill the mobic and place the referral for urology. thanks

## 2013-04-30 NOTE — Telephone Encounter (Signed)
I spoke with pt. She went to ED 10 days ago for kidney infection. She was told after 10 days of medication then to call PCP for referral to urology. I advised will forward to SN to advise. Also pt needed refill on meloxicam. i advised her it was sent to [facility] 03/21/13 x 3 refills. She voiced her understanding. Please advise SN thanks Last OV 02/05/13 No pending appt

## 2013-04-30 NOTE — Telephone Encounter (Signed)
Referral placed to Urology. Pt aware.

## 2013-05-28 ENCOUNTER — Telehealth: Payer: Self-pay | Admitting: Pulmonary Disease

## 2013-05-28 ENCOUNTER — Other Ambulatory Visit: Payer: Self-pay

## 2013-05-28 DIAGNOSIS — Z1231 Encounter for screening mammogram for malignant neoplasm of breast: Secondary | ICD-10-CM

## 2013-05-28 NOTE — Telephone Encounter (Signed)
Rec from [facility] forward 4 pages to Dr. Kriste Basque

## 2013-05-29 ENCOUNTER — Ambulatory Visit (INDEPENDENT_AMBULATORY_CARE_PROVIDER_SITE_OTHER): Payer: Medicare Other

## 2013-05-29 DIAGNOSIS — D649 Anemia, unspecified: Secondary | ICD-10-CM

## 2013-05-29 MED ORDER — CYANOCOBALAMIN 1000 MCG/ML IJ SOLN
1000.0000 ug | Freq: Once | INTRAMUSCULAR | Status: AC
  Administered 2013-05-29: 1000 ug via INTRAMUSCULAR

## 2013-06-21 ENCOUNTER — Ambulatory Visit (INDEPENDENT_AMBULATORY_CARE_PROVIDER_SITE_OTHER): Payer: Medicare Other

## 2013-06-21 ENCOUNTER — Ambulatory Visit
Admission: RE | Admit: 2013-06-21 | Discharge: 2013-06-21 | Disposition: A | Discharge status: Home / Self Care | Payer: Medicare Other | Source: Ambulatory Visit

## 2013-06-21 DIAGNOSIS — Z1231 Encounter for screening mammogram for malignant neoplasm of breast: Secondary | ICD-10-CM

## 2013-06-21 DIAGNOSIS — D510 Vitamin B12 deficiency anemia due to intrinsic factor deficiency: Secondary | ICD-10-CM

## 2013-06-21 MED ORDER — CYANOCOBALAMIN 1000 MCG/ML IJ SOLN
1000.0000 ug | Freq: Once | INTRAMUSCULAR | Status: AC
  Administered 2013-06-21: 1000 ug via INTRAMUSCULAR

## 2013-06-29 ENCOUNTER — Ambulatory Visit (INDEPENDENT_AMBULATORY_CARE_PROVIDER_SITE_OTHER): Payer: Medicare Other

## 2013-06-29 DIAGNOSIS — Z23 Encounter for immunization: Secondary | ICD-10-CM

## 2013-07-06 ENCOUNTER — Other Ambulatory Visit: Payer: Self-pay | Admitting: Nurse Practitioner

## 2013-07-06 DIAGNOSIS — R928 Other abnormal and inconclusive findings on diagnostic imaging of breast: Secondary | ICD-10-CM

## 2013-07-09 ENCOUNTER — Telehealth: Payer: Self-pay | Admitting: Pulmonary Disease

## 2013-07-09 MED ORDER — PRAVASTATIN SODIUM 40 MG PO TABS: 40.0000 mg | ORAL_TABLET | Freq: Every day | ORAL | Status: DC

## 2013-07-09 NOTE — Telephone Encounter (Signed)
I spoke with pt. She is needing RX for her pravaastatin sent to local pharmacy. She ran out and it takes about 3-5 days for her to get this medication. i advised will send rx and nothing further needed

## 2013-07-19 ENCOUNTER — Ambulatory Visit
Admission: RE | Admit: 2013-07-19 | Discharge: 2013-07-19 | Disposition: A | Discharge status: Home / Self Care | Payer: Medicare Other | Source: Ambulatory Visit | Attending: Nurse Practitioner | Admitting: Nurse Practitioner

## 2013-07-19 DIAGNOSIS — R928 Other abnormal and inconclusive findings on diagnostic imaging of breast: Secondary | ICD-10-CM

## 2013-07-20 ENCOUNTER — Ambulatory Visit (INDEPENDENT_AMBULATORY_CARE_PROVIDER_SITE_OTHER): Payer: Medicare Other

## 2013-07-20 DIAGNOSIS — E559 Vitamin D deficiency, unspecified: Secondary | ICD-10-CM

## 2013-07-20 MED ORDER — CYANOCOBALAMIN 1000 MCG/ML IJ SOLN
1000.0000 ug | Freq: Once | INTRAMUSCULAR | Status: AC
  Administered 2013-07-20: 1000 ug via INTRAMUSCULAR

## 2013-08-09 ENCOUNTER — Other Ambulatory Visit: Payer: Self-pay | Admitting: Urology

## 2013-08-15 ENCOUNTER — Ambulatory Visit: Payer: Medicare Other

## 2013-08-16 ENCOUNTER — Ambulatory Visit (INDEPENDENT_AMBULATORY_CARE_PROVIDER_SITE_OTHER): Payer: Medicare Other

## 2013-08-16 DIAGNOSIS — D510 Vitamin B12 deficiency anemia due to intrinsic factor deficiency: Secondary | ICD-10-CM

## 2013-08-17 MED ORDER — CYANOCOBALAMIN 1000 MCG/ML IJ SOLN
1000.0000 ug | Freq: Once | INTRAMUSCULAR | Status: AC
  Administered 2013-08-17: 1000 ug via INTRAMUSCULAR

## 2013-08-22 ENCOUNTER — Encounter (HOSPITAL_BASED_OUTPATIENT_CLINIC_OR_DEPARTMENT_OTHER): Payer: Self-pay | Admitting: *Deleted

## 2013-08-24 ENCOUNTER — Encounter (HOSPITAL_BASED_OUTPATIENT_CLINIC_OR_DEPARTMENT_OTHER): Payer: Self-pay | Admitting: *Deleted

## 2013-08-24 NOTE — Progress Notes (Signed)
To [facility] at 0715-Istat,Ekg on arrival-Npo after Mn. Instructed to take omeprazole,atenolol,xanax with sip of water that am.Guidelines for RCC reviewed.

## 2013-08-26 NOTE — H&P (Signed)
Active Problems Problems  1. Chronic cystitis (595.2) 2. 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 Dyspareunia, female (625.0) 4. Female stress incontinence (625.6) 5. Gross hematuria (599.71) 6. 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 Isabel Caprice for consultation about incontinence.     She originally presented in August of 2014 as a referral from Dr. Alroy Dust after an episode of hemorrhagic cystitis. She has a lifelong history of recurrent urinary tract infections that began with the onset of sexual activity. Her situation then improved but now has returned over the last couple of years. She estimates being treated 4 or 5 times over the last 3 years for probable lower tract cystitis. She often has gross hematuria with her episodes of cystitis. Back in July she had a sudden onset of severe pelvic pain with gross hematuria. She was seen in [facility] where a urine culture was positive for E coli. CT was negative for stone or obstruction and apparently she had normal upper tract imaging. She did respond to a course of antibiotics and has been doing well since then. She does, however, continue to complain of at least some bladder pressure. She has a prior history of "bladder tack" 7-8 years ago. She tells me she has recurrent prolapse symptoms with vaginal pressure and protrusion as well as some recurrent incontinence. She has a moderate degree of both obstructive and irritative voiding symptoms. At this point it appears she has mixed incontinence along with dyspareunia and some dysuria.      She underwent anterior and posterior repair along with a TVT tape in 2005. At the time of her anti-incontinence surgery one needles did perforate her bladder but that was repositioned. She was found to have significant recurrent prolapse along with her mixed incontinence. We informed her that her urodynamics would be necessary to further assess things  and repeat repair would most likely need to be done with mesh. We felt it important to get her in for cystoscopy to rule out any foreign body within her urethra bladder given her history. That cystoscopy did not show any evidence of sling erosion in the urethra or bladder. There was no other obvious anatomic abnormality other than the prolapse noted.   Past Medical History Problems  1. History of Anxiety (300.00) 2. History of Fibromyalgia (729.1) 3. History of Gastric ulcer (531.90) 4. History of depression (V11.8) 5. History of gastritis (V12.79) 6. History of hyperlipidemia (V12.29) 7. History of hypertension (V12.59) 8. History of Irritable bowel syndrome with diarrhea (564.1) 9. 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 History of Vitamin D deficiency (268.9)  Surgical History Problems  1. History of Colonoscopy (Fiberoptic) 2. History of Enteroscopic Procedures 3. History of Hysterectomy 4. History of Neck Surgery 5. History of Tubal Ligation  Current Meds 1. Atenolol 25 MG Oral Tablet;  Therapy: 19Jun2013 to Recorded 2. Cyclobenzaprine HCl - 5 MG Oral Tablet;  Therapy: 23May2014 to Recorded 3. Lisinopril-Hydrochlorothiazide 10-12.5 MG Oral Tablet;  Therapy: 19Jun2013 to Recorded 4. Meloxicam 7.5 MG Oral Tablet;  Therapy: 19Jun2013 to Recorded 5. Omeprazole 20 MG Oral Capsule Delayed Release;  Therapy: 19Jun2013 to Recorded 6. Pravastatin Sodium 40 MG Oral Tablet;  Therapy: 19Jun2013 to Recorded 7. Sertraline HCl - 100 MG Oral Tablet;  Therapy: 23May2014 to Recorded 8. Vitamin B-12 1000 MCG/ML SOLN;  Therapy: (Recorded:24Oct2014) to Recorded  Allergies Medication  1. Morphine Derivatives 2. Sulfa Drugs  Family History Problems  1. Family  history of Blood In Urine : Mother 2. Family history of Family Health Status - Father's Age : Father 3. Family history of Family Health Status - Mother's Age : Mother 4. Family history of Family Health Status Number Of  Children 5. Family history of Fibromyalgia : Sister 6. Family history of Heart Disease (V17.49) : Father 7. Family history of Nephrolithiasis : Sister 8. Family history of Renal Failure : Sister 85. Family history of Tuberculosis  Social History Problems    Being A Social Drinker   Caffeine Use   Former smoker Chemical engineer)   Marital History - Divorced (V61.03)   Occupation:  Review of Systems Genitourinary, constitutional, skin, eye, otolaryngeal, hematologic/lymphatic, cardiovascular, pulmonary, endocrine, musculoskeletal, gastrointestinal, neurological and psychiatric system(s) were reviewed and pertinent findings if present are noted.  Genitourinary: urinary urgency, nocturia, incontinence, urinary hesitancy, urinary stream starts and stops, incomplete emptying of bladder, pelvic pain, suprapubic pain, dyspareunia and initiating urination requires straining, but no dysuria, no inguinal pain and no inguinal swelling.  Gastrointestinal: abdominal pain, diarrhea and constipation, but no nausea, no vomiting, no flank pain and no heartburn.  Constitutional: feeling tired (fatigue).  ENT: sore throat and sinus problems.  Musculoskeletal: joint pain.  Neurological: headache.  Psychiatric: depression and anxiety. pain during intercourse    Vitals Vital Signs [Data Includes: Last 1 Day]  Recorded: 03Nov2014 05:02PM  Blood Pressure: 149 / 87 Temperature: 98 F Heart Rate: 62  Physical Exam Constitutional: Well nourished and well developed . No acute distress. The patient appears dehydrated.  ENT:. The ears and nose are normal in appearance.  Neck: The appearance of the neck is normal.  Pulmonary: No respiratory distress . No accessory muscle use.  Cardiovascular: Heart rate and rhythm are normal.  Abdomen: The abdomen is soft and nontender. A mass is palpable. No masses are palpated. The abdomen is not firm, not rigid, no rebound and no guarding. Tenderness is present diffusely  throughout the abdomen.  Genitourinary:  Chaperone Present: [name] [name].  Examination of the external genitalia shows normal female external genitalia and no vulvar atrophy. The urethra is tender, but does not appear stenotic and no urethral caruncle. There is no urethral mass. Urethral hypermobility is not present. There is no urethral discharge. No periurethral cyst is identified. There is no urethral prolapse. Vaginal exam demonstrates tenderness and the vaginal epithelium to be poorly estrogenized, but no abnormalities, no atrophy and no discharge. A cystocele is present with a midline defect (grade 3 /4). No enterocele is identified. No rectocele is identified. The cervix is is absent. The uterus is absent. The adnexa are palpably normal. The bladder is normal on palpation, non tender and not distended. The anus is normal on inspection. The perineum is normal on inspection.  Lymphatics: The femoral and inguinal nodes are not enlarged or tender.  Skin: Normal skin turgor, no visible rash and no visible skin lesions.  Neuro/Psych:. Mood and affect are appropriate.    Results/Data Selected Results  UA With REFLEX 24Oct2014 11:28AM [name] [name]   Test Name Result Flag Reference  COLOR YELLOW  YELLOW  APPEARANCE CLEAR  CLEAR  SPECIFIC GRAVITY 1.025  1.005-1.030  pH 6.0  5.0-8.0  GLUCOSE NEG mg/dL  NEG  BILIRUBIN NEG  NEG  KETONE NEG mg/dL  NEG  BLOOD NEG  NEG  PROTEIN NEG mg/dL  NEG  UROBILINOGEN 0.2 mg/dL  1.6-1.0  NITRITE NEG  NEG  LEUKOCYTE ESTERASE NEG  NEG   Assessment Assessed  1. Female stress incontinence (625.6)  2. 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 Chronic cystitis (595.2) 4. 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 Dyspareunia, female (625.0)  [name] [name] is a 67 yo female, referred by Dr. Isabel Caprice for evaluation of continued urinary incontinence, pelvic prolapse, and dyspareunia. She is post TVT per GYN in 2006 for urinary stress incontinence. Work-up at that time is not available for  review. She noted some success immediately after her TVT surgery , with reduction in her incontinence, but "soon after" her surgery, she noted the onset of pelvic pain, dyspareunia, and recurrent UTI.    She has been treated with multiple antibiotics, and now complains of a several month increasing vaginal "mass", painful to palpation, in association with dyspareunia, and her UTIs,   We have reviewed Dr. Ellin Goodie notes, and note that he has cystoscoped [name] [name] but not found any evidence of mesh erosion into the urethra, or extrusion into the vagina. Urodynamics show poor detrusor contraction pressure of 10cm H20, in association with low leak-point pressure of 65 cm H20. She does not leak when the cystocele is reduced, and the LPP determination is repeated.    On physical exam today, Karole has definite pain on either side of the urethra, at the site if her TVT surgery. She is also tender to palpation of her Stage III midline anterior vault prolapse, palpable at the introitus. I do not detect enterocele or posterior vault prolapse on this exam.    We have discussed reasonable goals to be achieved regarding her situation. She has been contacted by attorneys regarding inclusion in mesh lawsuits, but has not joined them, as of now. Realistic goals would be: ( 1) pain free; and reduce dyspareunia; (2) empty hypotonic bladder with SIC, repair prolapsed pelvic floor. We have discussed options for pelvic floor repair, including native tissue repair, biologic augmented repair, and mesh repair. She understands that I would normally lean toward vault repair withi mmesh sacrospinous fixation, but, in view of her chronic problem with the TVT mesh, I would advise her to have the augmented biologic repair. It would give her a better chance for repair than native tissue alone, but would have a 20% chance of failure, which would then be repaired with mesh fixation ( 2% failure rate).    In addition, she will need to self  cath, which she is willing to learn because it will empty her bladder-leaving less urine to become infected, and to have incontinence with-and take bethanechol to improve her detrusor contraction, as well as 1/day antibiotic. She will need pelvic floor repair and removal of TVT sling lateral sutures which are painful.   Plan Cystocele, midline, Female stress incontinence  1. Follow-up [place] Procedure Office  Follow-up to teach I&O cath  Status: Hold For -  Appointment,Date of Service  Requested for: 03Nov2014  1. Pelvic floor repair with biologic material/sacrospinous repair.  2. removal of TVT sutures  3. Teach SIC  4.Schedule surgery  5. Begin bethanechol 25mg  BID, and increase to 2 tabs bid. Max dose 200mg /day. Follow with flow rate and pvr.   6. Begin low dose antibiotic suppression therapy.   7. Begin MiraLAX bowel therapy prior to surgery.    Discussion/Summary cc: Dr. Barron [name]   cc: Dr. Alroy Dust     Signatures Electronically signed by : [name], M.D.; Aug 06 2013  6:43PM EST Electronically signed by : [name], M.D.; Aug 06 2013  6:53PM EST

## 2013-08-27 ENCOUNTER — Encounter (HOSPITAL_BASED_OUTPATIENT_CLINIC_OR_DEPARTMENT_OTHER)
Admission: RE | Disposition: A | Discharge status: Home / Self Care | Payer: Self-pay | Source: Ambulatory Visit | Attending: Urology

## 2013-08-27 ENCOUNTER — Ambulatory Visit (HOSPITAL_BASED_OUTPATIENT_CLINIC_OR_DEPARTMENT_OTHER)
Admission: RE | Admit: 2013-08-27 | Discharge: 2013-08-28 | Disposition: A | Discharge status: Home / Self Care | Payer: Medicare Other | Source: Ambulatory Visit | Attending: Urology | Admitting: Urology

## 2013-08-27 ENCOUNTER — Encounter (HOSPITAL_BASED_OUTPATIENT_CLINIC_OR_DEPARTMENT_OTHER): Payer: Self-pay | Admitting: Anesthesiology

## 2013-08-27 ENCOUNTER — Encounter (HOSPITAL_BASED_OUTPATIENT_CLINIC_OR_DEPARTMENT_OTHER): Payer: Medicare Other | Admitting: Anesthesiology

## 2013-08-27 ENCOUNTER — Ambulatory Visit (HOSPITAL_BASED_OUTPATIENT_CLINIC_OR_DEPARTMENT_OTHER): Payer: Medicare Other | Admitting: Anesthesiology

## 2013-08-27 ENCOUNTER — Other Ambulatory Visit: Payer: Self-pay

## 2013-08-27 DIAGNOSIS — K644 Residual hemorrhoidal skin tags: Secondary | ICD-10-CM | POA: Insufficient documentation

## 2013-08-27 DIAGNOSIS — N8111 Cystocele, midline: Secondary | ICD-10-CM | POA: Insufficient documentation

## 2013-08-27 DIAGNOSIS — R310 Gross hematuria: Secondary | ICD-10-CM | POA: Insufficient documentation

## 2013-08-27 DIAGNOSIS — IMO0002 Reserved for concepts with insufficient information to code with codable children: Secondary | ICD-10-CM

## 2013-08-27 DIAGNOSIS — K5900 Constipation, unspecified: Secondary | ICD-10-CM | POA: Insufficient documentation

## 2013-08-27 DIAGNOSIS — N312 Flaccid neuropathic bladder, not elsewhere classified: Secondary | ICD-10-CM | POA: Insufficient documentation

## 2013-08-27 DIAGNOSIS — N393 Stress incontinence (female) (male): Secondary | ICD-10-CM | POA: Insufficient documentation

## 2013-08-27 DIAGNOSIS — N3020 Other chronic cystitis without hematuria: Secondary | ICD-10-CM | POA: Insufficient documentation

## 2013-08-27 DIAGNOSIS — Z87440 Personal history of urinary (tract) infections: Secondary | ICD-10-CM | POA: Insufficient documentation

## 2013-08-27 DIAGNOSIS — N39498 Other specified urinary incontinence: Secondary | ICD-10-CM | POA: Insufficient documentation

## 2013-08-27 DIAGNOSIS — N8110 Cystocele, unspecified: Secondary | ICD-10-CM | POA: Insufficient documentation

## 2013-08-27 HISTORY — DX: Nausea with vomiting, unspecified: Z98.890

## 2013-08-27 HISTORY — DX: Hyperlipidemia, unspecified: E78.5

## 2013-08-27 HISTORY — DX: Anxiety disorder, unspecified: F41.9

## 2013-08-27 HISTORY — PX: VAGINAL PROLAPSE REPAIR: SHX830

## 2013-08-27 HISTORY — DX: Personal history of other diseases of the digestive system: Z87.19

## 2013-08-27 HISTORY — DX: Gastro-esophageal reflux disease without esophagitis: K21.9

## 2013-08-27 HISTORY — DX: Fibromyalgia: M79.7

## 2013-08-27 HISTORY — DX: Residual hemorrhoidal skin tags: K64.4

## 2013-08-27 HISTORY — DX: Other hemorrhoids: K64.8

## 2013-08-27 HISTORY — DX: Other chronic cystitis without hematuria: N30.20

## 2013-08-27 HISTORY — DX: Nausea with vomiting, unspecified: R11.2

## 2013-08-27 HISTORY — PX: CYSTOSCOPY: SHX5120

## 2013-08-27 LAB — POCT I-STAT 4, (NA,K, GLUC, HGB,HCT)
Glucose, Bld: 99 mg/dL (ref 70–99)
HCT: 34 % — ABNORMAL LOW (ref 36.0–46.0)
Hemoglobin: 11.6 g/dL — ABNORMAL LOW (ref 12.0–15.0)

## 2013-08-27 SURGERY — SUSPENSION, VAGINAL VAULT
Anesthesia: General | Site: Vagina | Wound class: Clean Contaminated

## 2013-08-27 MED ORDER — CEFAZOLIN SODIUM-DEXTROSE 2-3 GM-% IV SOLR
INTRAVENOUS | Status: DC | PRN
  Administered 2013-08-27: 2 g via INTRAVENOUS

## 2013-08-27 MED ORDER — HYDROMORPHONE HCL PF 1 MG/ML IJ SOLN
0.2500 mg | INTRAMUSCULAR | Status: DC | PRN
  Filled 2013-08-27: qty 1

## 2013-08-27 MED ORDER — ONDANSETRON HCL 4 MG/2ML IJ SOLN
4.0000 mg | INTRAMUSCULAR | Status: DC | PRN
  Filled 2013-08-27: qty 2

## 2013-08-27 MED ORDER — SIMVASTATIN 5 MG PO TABS
5.0000 mg | ORAL_TABLET | Freq: Every day | ORAL | Status: DC
  Filled 2013-08-27: qty 1

## 2013-08-27 MED ORDER — MELOXICAM 7.5 MG PO TABS
7.5000 mg | ORAL_TABLET | Freq: Every day | ORAL | Status: DC
  Filled 2013-08-27: qty 1

## 2013-08-27 MED ORDER — MIDAZOLAM HCL 2 MG/2ML IJ SOLN
INTRAMUSCULAR | Status: AC
  Filled 2013-08-27: qty 2

## 2013-08-27 MED ORDER — ATENOLOL 25 MG PO TABS
25.0000 mg | ORAL_TABLET | Freq: Every day | ORAL | Status: DC
Start: 2013-08-27 — End: 2013-08-28
  Filled 2013-08-27: qty 1

## 2013-08-27 MED ORDER — BUPIVACAINE-EPINEPHRINE 0.5% -1:200000 IJ SOLN
INTRAMUSCULAR | Status: DC | PRN
  Administered 2013-08-27: 30 mL

## 2013-08-27 MED ORDER — CIPROFLOXACIN HCL 500 MG PO TABS
ORAL_TABLET | ORAL | Status: AC
  Filled 2013-08-27: qty 1

## 2013-08-27 MED ORDER — OXYCODONE HCL 5 MG/5ML PO SOLN
5.0000 mg | Freq: Once | ORAL | Status: DC | PRN
  Filled 2013-08-27: qty 5

## 2013-08-27 MED ORDER — SERTRALINE HCL 100 MG PO TABS
100.0000 mg | ORAL_TABLET | Freq: Every day | ORAL | Status: DC
  Filled 2013-08-27: qty 1

## 2013-08-27 MED ORDER — SODIUM CHLORIDE 0.45 % IV SOLN
INTRAVENOUS | Status: DC
  Administered 2013-08-27: 14:00:00 via INTRAVENOUS
  Filled 2013-08-27: qty 1000

## 2013-08-27 MED ORDER — SENNOSIDES-DOCUSATE SODIUM 8.6-50 MG PO TABS
2.0000 | ORAL_TABLET | Freq: Every day | ORAL | Status: DC
  Administered 2013-08-27: 2 via ORAL
  Filled 2013-08-27 (×3): qty 2

## 2013-08-27 MED ORDER — LACTATED RINGERS IV SOLN
INTRAVENOUS | Status: DC
  Administered 2013-08-27: 08:00:00 via INTRAVENOUS
  Filled 2013-08-27: qty 1000

## 2013-08-27 MED ORDER — MIDAZOLAM HCL 5 MG/5ML IJ SOLN
INTRAMUSCULAR | Status: DC | PRN
  Administered 2013-08-27 (×2): 1 mg via INTRAVENOUS

## 2013-08-27 MED ORDER — KETOROLAC TROMETHAMINE 30 MG/ML IJ SOLN
INTRAMUSCULAR | Status: DC | PRN
  Administered 2013-08-27: 30 mg via INTRAVENOUS

## 2013-08-27 MED ORDER — FENTANYL CITRATE 0.05 MG/ML IJ SOLN
INTRAMUSCULAR | Status: DC | PRN
  Administered 2013-08-27 (×2): 25 ug via INTRAVENOUS
  Administered 2013-08-27: 50 ug via INTRAVENOUS
  Administered 2013-08-27 (×8): 25 ug via INTRAVENOUS

## 2013-08-27 MED ORDER — LISINOPRIL-HYDROCHLOROTHIAZIDE 10-12.5 MG PO TABS: 1.0000 | ORAL_TABLET | Freq: Every day | ORAL | Status: DC

## 2013-08-27 MED ORDER — DIPHENHYDRAMINE HCL 50 MG/ML IJ SOLN
12.5000 mg | Freq: Four times a day (QID) | INTRAMUSCULAR | Status: DC | PRN
  Filled 2013-08-27: qty 0.5

## 2013-08-27 MED ORDER — DEXAMETHASONE SODIUM PHOSPHATE 4 MG/ML IJ SOLN
INTRAMUSCULAR | Status: DC | PRN
  Administered 2013-08-27: 10 mg via INTRAVENOUS

## 2013-08-27 MED ORDER — ALPRAZOLAM 0.25 MG PO TABS
0.2500 mg | ORAL_TABLET | Freq: Two times a day (BID) | ORAL | Status: DC | PRN
  Filled 2013-08-27: qty 1

## 2013-08-27 MED ORDER — IBUPROFEN 800 MG PO TABS
800.0000 mg | ORAL_TABLET | Freq: Three times a day (TID) | ORAL | Status: DC | PRN
Start: 2013-08-27 — End: 2014-04-16

## 2013-08-27 MED ORDER — ACETAMINOPHEN 10 MG/ML IV SOLN
INTRAVENOUS | Status: DC | PRN
  Administered 2013-08-27: 1000 mg via INTRAVENOUS

## 2013-08-27 MED ORDER — LIDOCAINE HCL (CARDIAC) 20 MG/ML IV SOLN
INTRAVENOUS | Status: DC | PRN
  Administered 2013-08-27: 100 mg via INTRAVENOUS

## 2013-08-27 MED ORDER — EPHEDRINE SULFATE 50 MG/ML IJ SOLN
INTRAMUSCULAR | Status: DC | PRN
  Administered 2013-08-27 (×2): 10 mg via INTRAVENOUS

## 2013-08-27 MED ORDER — ONDANSETRON HCL 4 MG/2ML IJ SOLN
INTRAMUSCULAR | Status: DC | PRN
  Administered 2013-08-27: 4 mg via INTRAVENOUS

## 2013-08-27 MED ORDER — HEMOSTATIC AGENTS (NO CHARGE) OPTIME
TOPICAL | Status: DC | PRN
  Administered 2013-08-27: 1 via TOPICAL

## 2013-08-27 MED ORDER — ZOLPIDEM TARTRATE 5 MG PO TABS
5.0000 mg | ORAL_TABLET | Freq: Every evening | ORAL | Status: DC | PRN
  Filled 2013-08-27: qty 1

## 2013-08-27 MED ORDER — SODIUM CHLORIDE 0.9 % IJ SOLN
INTRAMUSCULAR | Status: DC | PRN
  Administered 2013-08-27: 50 mL via INTRAVENOUS

## 2013-08-27 MED ORDER — STERILE WATER FOR IRRIGATION IR SOLN
Status: DC | PRN
  Administered 2013-08-27: 1000 mL

## 2013-08-27 MED ORDER — DIPHENHYDRAMINE HCL 12.5 MG/5ML PO ELIX
12.5000 mg | ORAL_SOLUTION | Freq: Four times a day (QID) | ORAL | Status: DC | PRN
  Filled 2013-08-27: qty 10

## 2013-08-27 MED ORDER — BISACODYL 10 MG RE SUPP
10.0000 mg | Freq: Every day | RECTAL | Status: DC | PRN
  Filled 2013-08-27: qty 1

## 2013-08-27 MED ORDER — CYCLOBENZAPRINE HCL 5 MG PO TABS
5.0000 mg | ORAL_TABLET | Freq: Two times a day (BID) | ORAL | Status: DC | PRN
  Administered 2013-08-27: 5 mg via ORAL
  Filled 2013-08-27 (×2): qty 1

## 2013-08-27 MED ORDER — MEPERIDINE HCL 25 MG/ML IJ SOLN
6.2500 mg | INTRAMUSCULAR | Status: DC | PRN
  Filled 2013-08-27: qty 1

## 2013-08-27 MED ORDER — ACETAMINOPHEN 500 MG PO TABS
1000.0000 mg | ORAL_TABLET | Freq: Four times a day (QID) | ORAL | Status: DC
  Filled 2013-08-27: qty 2

## 2013-08-27 MED ORDER — OXYCODONE HCL 5 MG PO TABS
5.0000 mg | ORAL_TABLET | Freq: Once | ORAL | Status: DC | PRN
Start: 2013-08-27 — End: 2013-08-27
  Administered 2013-08-27: 5 mg via ORAL
  Filled 2013-08-27: qty 1

## 2013-08-27 MED ORDER — PROMETHAZINE HCL 25 MG/ML IJ SOLN
6.2500 mg | INTRAMUSCULAR | Status: DC | PRN
  Filled 2013-08-27: qty 1

## 2013-08-27 MED ORDER — CEFAZOLIN SODIUM 1-5 GM-% IV SOLN
1.0000 g | INTRAVENOUS | Status: DC
  Filled 2013-08-27: qty 50

## 2013-08-27 MED ORDER — KETOROLAC TROMETHAMINE 30 MG/ML IJ SOLN
INTRAMUSCULAR | Status: AC
  Filled 2013-08-27: qty 1

## 2013-08-27 MED ORDER — FENTANYL CITRATE 0.05 MG/ML IJ SOLN
INTRAMUSCULAR | Status: AC
  Filled 2013-08-27: qty 8

## 2013-08-27 MED ORDER — PROPOFOL 10 MG/ML IV BOLUS
INTRAVENOUS | Status: DC | PRN
  Administered 2013-08-27: 200 mg via INTRAVENOUS

## 2013-08-27 MED ORDER — ACETAMINOPHEN 500 MG PO TABS
1000.0000 mg | ORAL_TABLET | Freq: Four times a day (QID) | ORAL | Status: DC
  Administered 2013-08-27 (×2): 1000 mg via ORAL
  Filled 2013-08-27: qty 2

## 2013-08-27 MED ORDER — CEFAZOLIN SODIUM-DEXTROSE 2-3 GM-% IV SOLR
2.0000 g | INTRAVENOUS | Status: DC
  Filled 2013-08-27: qty 50

## 2013-08-27 MED ORDER — LACTATED RINGERS IV SOLN
INTRAVENOUS | Status: DC | PRN
  Administered 2013-08-27 (×2): via INTRAVENOUS

## 2013-08-27 MED ORDER — OXYCODONE HCL 5 MG PO TABS
ORAL_TABLET | ORAL | Status: AC
  Filled 2013-08-27: qty 1

## 2013-08-27 MED ORDER — ESTRADIOL 0.1 MG/GM VA CREA
TOPICAL_CREAM | VAGINAL | Status: DC | PRN
  Administered 2013-08-27: 1 via VAGINAL

## 2013-08-27 MED ORDER — KETOROLAC TROMETHAMINE 30 MG/ML IJ SOLN
30.0000 mg | Freq: Four times a day (QID) | INTRAMUSCULAR | Status: DC
  Administered 2013-08-27 (×2): 30 mg via INTRAVENOUS
  Filled 2013-08-27: qty 1

## 2013-08-27 MED ORDER — SODIUM CHLORIDE 0.9 % IR SOLN
Status: DC | PRN
  Administered 2013-08-27: 10:00:00

## 2013-08-27 MED ORDER — CIPROFLOXACIN HCL 500 MG PO TABS
500.0000 mg | ORAL_TABLET | Freq: Two times a day (BID) | ORAL | Status: AC
  Administered 2013-08-27: 500 mg via ORAL
  Filled 2013-08-27: qty 1

## 2013-08-27 MED ORDER — HYOSCYAMINE SULFATE 0.125 MG SL SUBL
0.1250 mg | SUBLINGUAL_TABLET | Freq: Four times a day (QID) | SUBLINGUAL | Status: DC | PRN
  Filled 2013-08-27: qty 1

## 2013-08-27 MED ORDER — PANTOPRAZOLE SODIUM 40 MG PO TBEC
40.0000 mg | DELAYED_RELEASE_TABLET | Freq: Every day | ORAL | Status: DC
  Filled 2013-08-27: qty 1

## 2013-08-27 MED ORDER — METOCLOPRAMIDE HCL 5 MG/ML IJ SOLN
INTRAMUSCULAR | Status: DC | PRN
  Administered 2013-08-27: 10 mg via INTRAVENOUS

## 2013-08-27 MED ORDER — INDIGOTINDISULFONATE SODIUM 8 MG/ML IJ SOLN
INTRAMUSCULAR | Status: DC | PRN
  Administered 2013-08-27: 5 mL via INTRAVENOUS

## 2013-08-27 MED ORDER — DICYCLOMINE HCL 20 MG PO TABS
20.0000 mg | ORAL_TABLET | Freq: Four times a day (QID) | ORAL | Status: DC
  Administered 2013-08-28: 20 mg via ORAL
  Filled 2013-08-27 (×2): qty 1

## 2013-08-27 MED ORDER — GLYCOPYRROLATE 0.2 MG/ML IJ SOLN
INTRAMUSCULAR | Status: DC | PRN
  Administered 2013-08-27: 0.2 mg via INTRAVENOUS

## 2013-08-27 SURGICAL SUPPLY — 59 items
ADH SKN CLS APL DERMABOND .7 (GAUZE/BANDAGES/DRESSINGS)
BAG URINE DRAINAGE (UROLOGICAL SUPPLIES) ×4 IMPLANT
BLADE SURG 10 STRL SS (BLADE) ×4 IMPLANT
BLADE SURG 15 STRL LF DISP TIS (BLADE) ×3 IMPLANT
BLADE SURG 15 STRL SS (BLADE) ×4
BLADE SURG ROTATE 9660 (MISCELLANEOUS) ×4 IMPLANT
BOOTIES KNEE HIGH SLOAN (MISCELLANEOUS) ×2 IMPLANT
CANISTER SUCTION 2500CC (MISCELLANEOUS) ×8 IMPLANT
CATH FOLEY 2WAY SLVR  5CC 16FR (CATHETERS) ×1
CATH FOLEY 2WAY SLVR 5CC 16FR (CATHETERS) ×3 IMPLANT
CLOTH BEACON ORANGE TIMEOUT ST (SAFETY) ×4 IMPLANT
COVER MAYO STAND STRL (DRAPES) ×4 IMPLANT
COVER TABLE BACK 60X90 (DRAPES) ×4 IMPLANT
DERMABOND ADVANCED (GAUZE/BANDAGES/DRESSINGS)
DERMABOND ADVANCED .7 DNX12 (GAUZE/BANDAGES/DRESSINGS) IMPLANT
DEVICE CAPIO SLIM BOX (INSTRUMENTS) ×4 IMPLANT
DISSECTOR ROUND CHERRY 3/8 STR (MISCELLANEOUS) ×2 IMPLANT
DRAPE CAMERA CLOSED 9X96 (DRAPES) ×4 IMPLANT
DRAPE UNDERBUTTOCKS STRL (DRAPE) ×4 IMPLANT
FLOSEAL 10ML (HEMOSTASIS) IMPLANT
GAUZE SPONGE 4X4 16PLY XRAY LF (GAUZE/BANDAGES/DRESSINGS) IMPLANT
GLOVE BIO SURGEON STRL SZ7.5 (GLOVE) ×4 IMPLANT
GLOVE INDICATOR 7.5 STRL GRN (GLOVE) ×4 IMPLANT
GOWN PREVENTION PLUS LG XLONG (DISPOSABLE) ×4 IMPLANT
GOWN STRL REIN XL XLG (GOWN DISPOSABLE) ×4 IMPLANT
NDL 1/2 CIR CATGUT .05X1.09 (NEEDLE) IMPLANT
NEEDLE 1/2 CIR CATGUT .05X1.09 (NEEDLE) ×4 IMPLANT
NEEDLE HYPO 22GX1.5 SAFETY (NEEDLE) ×8 IMPLANT
PACKING VAGINAL (PACKING) ×4 IMPLANT
PENCIL BUTTON HOLSTER BLD 10FT (ELECTRODE) ×4 IMPLANT
PLUG CATH AND CAP STER (CATHETERS) ×4 IMPLANT
RETRACTOR LONRSTAR 16.6X16.6CM (MISCELLANEOUS) ×3 IMPLANT
RETRACTOR STAY HOOK 5MM (MISCELLANEOUS) ×4 IMPLANT
RETRACTOR STER APS 16.6X16.6CM (MISCELLANEOUS) ×4
SET IRRIG Y TYPE TUR BLADDER L (SET/KITS/TRAYS/PACK) ×4 IMPLANT
SHEET LAVH (DRAPES) ×4 IMPLANT
SLING SOLYX SYSTEM SIS BX (SLING) IMPLANT
SPONGE LAP 4X18 X RAY DECT (DISPOSABLE) ×8 IMPLANT
SUCTION FRAZIER TIP 10 FR DISP (SUCTIONS) ×4 IMPLANT
SUT ABS MONO DBL WITH NDL 48IN (SUTURE) IMPLANT
SUT ETHILON 2 0 PS N (SUTURE) IMPLANT
SUT MON AB 2-0 SH 27 (SUTURE)
SUT MON AB 2-0 SH27 (SUTURE) IMPLANT
SUT NONABSORB MONO DB W/NDL 48 (SUTURE) ×4 IMPLANT
SUT PDS AB 3-0 SH 27 (SUTURE) IMPLANT
SUT SILK 3 0 PS 1 (SUTURE) ×4 IMPLANT
SUT VIC AB 0 CT1 36 (SUTURE) IMPLANT
SUT VIC AB 2-0 CT1 27 (SUTURE)
SUT VIC AB 2-0 CT1 TAPERPNT 27 (SUTURE) ×8 IMPLANT
SUT VIC AB 2-0 UR6 27 (SUTURE) ×24 IMPLANT
SYR BULB IRRIGATION 50ML (SYRINGE) ×4 IMPLANT
SYR CONTROL 10ML LL (SYRINGE) ×8 IMPLANT
SYRINGE 10CC LL (SYRINGE) ×4 IMPLANT
TISSUE REPAIR XENFORM 6X10CM (Tissue) ×2 IMPLANT
TRAY DSU PREP LF (CUSTOM PROCEDURE TRAY) ×4 IMPLANT
TUBE CONNECTING 12X1/4 (SUCTIONS) ×8 IMPLANT
WATER STERILE IRR 3000ML UROMA (IV SOLUTION) ×2 IMPLANT
WATER STERILE IRR 500ML POUR (IV SOLUTION) ×6 IMPLANT
YANKAUER SUCT BULB TIP NO VENT (SUCTIONS) IMPLANT

## 2013-08-27 NOTE — Interval H&P Note (Signed)
History and Physical Interval Note:  08/27/2013 8:30 AM  [name] [name]  has presented today for surgery, with the diagnosis of ANTERIOR VAULT PELVIC PAIN AND PAINFUL TVT SUTURES   The various methods of treatment have been discussed with the patient and family. After consideration of risks, benefits and other options for treatment, the patient has consented to  Procedure(s): REMOVAL OF TVT SUTURES  (N/A) ANTERIOR VAULT PROLAPSE REPAIR WITH [employer]  TISSUE-CAPPIO/ SACROSPINOUS REPAIR  (N/A) as a surgical intervention .  The patient's history has been reviewed, patient examined, no change in status, stable for surgery.  I have reviewed the patient's chart and labs.  Questions were answered to the patient's satisfaction.     [name] I

## 2013-08-27 NOTE — Anesthesia Procedure Notes (Signed)
Procedure Name: LMA Insertion Date/Time: 08/27/2013 8:54 AM Performed by: Jessica Priest Pre-anesthesia Checklist: Patient identified, Emergency Drugs available, Suction available and Patient being monitored Patient Re-evaluated:Patient Re-evaluated prior to inductionOxygen Delivery Method: Circle System Utilized Preoxygenation: Pre-oxygenation with 100% oxygen Intubation Type: IV induction Ventilation: Mask ventilation without difficulty LMA: LMA inserted LMA Size: 4.0 Number of attempts: 1 Airway Equipment and Method: bite block Placement Confirmation: positive ETCO2 Tube secured with: Tape Dental Injury: Teeth and Oropharynx as per pre-operative assessment

## 2013-08-27 NOTE — Transfer of Care (Signed)
Immediate Anesthesia Transfer of Care Note  Patient: [name] [name]  Procedure(s) Performed: Procedure(s) (LRB): ANTERIOR VAGINAL VAULT SUSPENSION, KELLY PLICATION WITH SACROSPINOUS LIGAMENT FIXATION AND XENFORM BOVINE DERMIS GRAFT AUGMENTATION, URETHRAL EXPLORATION, URETHROLYSIS, EXPLANTATION OF TVT TAPE, IMPLANTATION OF FLOSEAL, IMPLANTATION OF XENFORM BOVINE GRAFT IN PERIURETHRAL SPACE (N/A) CYSTOSCOPY (N/A)  Patient Location: PACU  Anesthesia Type: General  Level of Consciousness: awake, sedated, patient cooperative and responds to stimulation  Airway & Oxygen Therapy: Patient Spontanous Breathing and Patient connected to face mask oxygen  Post-op Assessment: Report given to PACU RN, Post -op Vital signs reviewed and stable and Patient moving all extremities  Post vital signs: Reviewed and stable  Complications: No apparent anesthesia complications

## 2013-08-27 NOTE — Anesthesia Preprocedure Evaluation (Addendum)
Anesthesia Evaluation  Patient identified by MRN, date of birth, ID band Patient awake    Reviewed: Allergy & Precautions, H&P , NPO status , Patient's Chart, lab work & pertinent test results, reviewed documented beta blocker date and time   History of Anesthesia Complications (+) PONV and history of anesthetic complications  Airway Mallampati: II TM Distance: >3 FB Neck ROM: Full    Dental  (+) Dental Advisory Given and Teeth Intact   Pulmonary neg pulmonary ROS, former smoker,          Cardiovascular hypertension, Pt. on medications and Pt. on home beta blockers negative cardio ROS      Neuro/Psych Anxiety    GI/Hepatic Neg liver ROS, GERD-  Medicated,  Endo/Other  negative endocrine ROS  Renal/GU negative Renal ROS     Musculoskeletal negative musculoskeletal ROS (+) Fibromyalgia -  Abdominal   Peds  Hematology  (+) anemia ,   Anesthesia Other Findings   Reproductive/Obstetrics negative OB ROS                          Anesthesia Physical Anesthesia Plan  ASA: II  Anesthesia Plan: General   Post-op Pain Management:    Induction: Intravenous  Airway Management Planned: LMA  Additional Equipment:   Intra-op Plan:   Post-operative Plan: Extubation in OR  Informed Consent: I have reviewed the patients History and Physical, chart, labs and discussed the procedure including the risks, benefits and alternatives for the proposed anesthesia with the patient or authorized representative who has indicated his/her understanding and acceptance.   Dental advisory given  Plan Discussed with: CRNA  Anesthesia Plan Comments:         Anesthesia Quick Evaluation

## 2013-08-27 NOTE — Anesthesia Postprocedure Evaluation (Signed)
Anesthesia Post Note  Patient: [name] [name]  Procedure(s) Performed: Procedure(s) (LRB): ANTERIOR VAGINAL VAULT SUSPENSION, KELLY PLICATION WITH SACROSPINOUS LIGAMENT FIXATION AND XENFORM BOVINE DERMIS GRAFT AUGMENTATION, URETHRAL EXPLORATION, URETHROLYSIS, EXPLANTATION OF TVT TAPE, IMPLANTATION OF FLOSEAL, IMPLANTATION OF XENFORM BOVINE GRAFT IN PERIURETHRAL SPACE (N/A) CYSTOSCOPY (N/A)  Anesthesia type: General  Patient location: PACU  Post pain: Pain level controlled  Post assessment: Post-op Vital signs reviewed  Last Vitals: BP 90/60  Pulse 68  Temp(Src) 37.3 C (Oral)  Resp 15  Ht 5\' 6"  (1.676 m)  Wt 184 lb (83.462 kg)  BMI 29.71 kg/m2  SpO2 99%  Post vital signs: Reviewed  Level of consciousness: sedated  Complications: No apparent anesthesia complications

## 2013-08-27 NOTE — Progress Notes (Signed)
Pt c/o hip discomfort and a slight sinus headache.  Flexeril given.  B/p currently 96/57.   Urine in foley bag clear yellow.

## 2013-08-27 NOTE — Op Note (Signed)
Pre-operative diagnosis :   Stage III anterior vault prolapse, urinary sphincteric incontinence, hypotonic bladder, urethral pain  Postoperative diagnosis:  Same, with addition of  migration of TVT tape.   Operation:  Anterior vaginal vault suspension with Kelly plication, and sacrospinous ligament fixation with Xeroform bovine dermis graft augmentation; urethral exploration with urethral lysis, explantation of migrated TVT tape, implantation of FloSeal, implantation of Xeroform bovine graft in the periurethral space.  Surgeon:  Kathie Rhodes. Patsi Sears, MD  First assistant: None    Anesthesia:  General LMA   EBL: 50 cc  Preparation: After appropriate preanesthesia, the patient was brought to the operating room, placed on the operating table in the dorsal supine position her general LMA anesthesia was introduced. She was then replaced in the dorsal lithotomy position with pubis was prepped with Betadine solution and draped in usual fashion. The history was double checked. The arm band was double checked.   Review history:  Chronic cystitis (595.2)  2. 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 Dyspareunia, female (625.0)  4. Female stress incontinence (625.6)  5. Gross hematuria (599.71)  6. 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 is a 67 yo female, referred by Dr. Isabel Caprice for consultation about incontinence.  She originally presented in August of 2014 as a referral from Dr. Alroy Dust after an episode of hemorrhagic cystitis. She has a lifelong history of recurrent urinary tract infections that began with the onset of sexual activity. Her situation then improved but now has returned over the last couple of years. She estimates being treated 4 or 5 times over the last 3 years for probable lower tract cystitis. She often has gross hematuria with her episodes of cystitis. Back in July she had a sudden onset of severe pelvic pain with gross hematuria. She was seen in [facility] where a  urine culture was positive for E coli. CT was negative for stone or obstruction and apparently she had normal upper tract imaging. She did respond to a course of antibiotics and has been doing well since then. She does, however, continue to complain of at least some bladder pressure. She has a prior history of "bladder tack" 7-8 years ago. She tells me she has recurrent prolapse symptoms with vaginal pressure and protrusion as well as some recurrent incontinence. She has a moderate degree of both obstructive and irritative voiding symptoms. At this point it appears she has mixed incontinence along with dyspareunia and some dysuria.  She underwent anterior and posterior repair along with a TVT tape in 2005. At the time of her anti-incontinence surgery one needles did perforate her bladder but that was repositioned. She was found to have significant recurrent prolapse along with her mixed incontinence. 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 The patient was taught how to self cath, and was placed on MiraLAX preop for chronic constipation.       Statement of  Likelihood of Success: Excellent. TIME-OUT observed.:  Procedure:  Inspection of the perineum revealed moderate to severe external hemorrhoids. The perineum was otherwise normal. Vaginal inspection revealed stage III anterior vault prolapse, to the level of the vaginal introitus. There was no rectocele noted. There was no cervix noted. Suture was placed at the level of the apex. The patient also had inspection of the urethra, and a tight area in the midline of the urethra very close the bladder neck was identified. This  is most likely an area of interest for expiration evaluation. It is noted that on office examination, the patient had exquisite tenderness at the level of the bladder neck, but actually on the left and right sides of  the bladder neck.  Using a marking pen, an area 2 cm proximal to the urethra oh vaginal junction was marked horizontally. 50 cc of Marcaine 1% with epinephrine 12 200,000 was then injected in order to provide local anesthetic, in order to hydrodissect the tissue. A 5 cm horizontal incision is made and subcutaneous tissue is dissected yielding a large cystocele. Anterior and posterior dissection was accomplished with sharp and blunt dissection. Dissection is carried into the pelvic floor bilaterally, and the ischial spines are identified bilaterally, and the sacrospinous ligament are identified bilaterally. A Kelly plication is accomplished using 2-0 Vicryl suture. Following this, permanent sutures are then placed in the sacrospinous ligament using the U. suture carrier, greater than one finger breath medial to the ischial spines, bilaterally. Following this, the permanent sutures are then placed through a previously measured and soaked piece of Xeroform, measuring 7 cm x 5 cm. The U. sutures were then placed through either corner, and sutured into the proximal corners in the sacrospinous ligaments. Using 2-0 Vicryl sutures, the lateral sides are then sutured with simple 2-0 Vicryl sutures, and the distal corners were sutured with 2-0 Vicryl sutures. The distal portion of the Xeroform is then sutured with 2-0 Vicryl suture. Excellent anterior vault suspension is accomplished, and no bleeding is noted. A 1 cm tip of vaginal tissue is then removed, and the vaginal flap is then closed by running a 2-0 Vicryl suture from the 12:00 to the 8:00 position, and from the 12:00 to the 4:00 position. Irrigation was accomplished.  Attention is then directed to the urethra. The mid urethra is marked with a fresh marking pen. 10 cc of Marcaine percent with epinephrine 1 200,000 is then injected. A 2 cm mid urethral incision is made, subcutaneous tissue is dissected bilaterally. At the proximal portion of the incision, at the  level bladder neck, a blue suture is identified, with  mesh attached to either side. 90% of the mesh had migrated to the left periurethral area, and 10% and this was identified the right side. The entire portion of mesh was dissected and removed from the right side, as well as the left side. It is sent to laboratory for examination. The mesh appeared to be rolled. The urethra appeared to be "banjo string", without erosion into the urethra.  Cystoscopy was accomplished after indigocarmine was given, and no erosion was noted. There was no mesh within the bladder. Blue contrast was seen from both ureteral orifices. The periurethral incision was closed with 2-0 Vicryl suture. Estrace cream was then placed on vaginal packing this was placed and Foley catheter replaced. The patient received IV Tylenol, and IV Toradol. She did not receive B. and O. suppository because of her morphine allergy. She was awakened and taken to recovery room in good condition.

## 2013-08-28 ENCOUNTER — Encounter (HOSPITAL_BASED_OUTPATIENT_CLINIC_OR_DEPARTMENT_OTHER): Payer: Self-pay | Admitting: Urology

## 2013-08-28 MED ORDER — TRAMADOL HCL 50 MG PO TABS
ORAL_TABLET | ORAL | Status: AC
  Filled 2013-08-28: qty 1

## 2013-08-28 NOTE — Discharge Summary (Signed)
Physician Discharge Summary  Patient ID: MAIMUNA LEAMAN MRN: [id number] DOB/AGE: Feb 04, 1946 67 y.o.  Admit date: 08/27/2013 Discharge date: 08/28/2013  Admission Diagnoses: ANTERIOR VAULT PELVIC PAIN AND PAINFUL TVT SUTURES   Discharge Diagnoses:  Same  Discharged Condition: Improved  Hospital Course:   Surgery  Consults: none  Significant Diagnostic Studies: No results found.  Treatments: surgery  Discharge Exam: Blood pressure 99/57, pulse 64, temperature 97.2 F (36.2 C), temperature source Oral, resp. rate 18, height 5\' 6"  (1.676 m), weight 83.462 kg (184 lb), SpO2 96.00%. General appearance: alert and cooperative  Disposition: Home  Discharge Orders   Future Orders Complete By Expires   Discharge patient  As directed    Discontinue IV  As directed        Medication List         ALPRAZolam 0.5 MG tablet  Commonly known as:  XANAX  Take 1/2-1 tablet by mouth 3 times a day as needed     aspirin 81 MG tablet  Take 81 mg by mouth daily.     atenolol 25 MG tablet  Commonly known as:  TENORMIN  Take 1 tablet (25 mg total) by mouth daily.     calcium carbonate 600 MG Tabs tablet  Commonly known as:  OS-CAL  Take 600 mg by mouth daily.     cholecalciferol 1000 UNITS tablet  Commonly known as:  VITAMIN D  Take 1,000 Units by mouth 2 (two) times daily.     cyanocobalamin 1000 MCG/ML injection  Commonly known as:  (VITAMIN B-12)  Inject 1 mL (1,000 mcg total) into the muscle every 30 (thirty) days.     cyclobenzaprine 5 MG tablet  Commonly known as:  FLEXERIL  Take 1 tablet (5 mg total) by mouth 2 (two) times daily as needed for muscle spasms.     dicyclomine 20 MG tablet  Commonly known as:  BENTYL  Take 1 tablet (20 mg total) by mouth every 6 (six) hours.     FIRST-DUKES MOUTHWASH Susp  Take 1 tsp qid swish and swallow.     ibuprofen 800 MG tablet  Commonly known as:  ADVIL,MOTRIN  Take 1 tablet (800 mg total) by mouth every 8 (eight) hours as  needed for fever, headache, mild pain or cramping. Take with food     lisinopril-hydrochlorothiazide 10-12.5 MG per tablet  Commonly known as:  PRINZIDE,ZESTORETIC  Take 1 tablet by mouth daily.     meloxicam 7.5 MG tablet  Commonly known as:  MOBIC  Take 1 tablet (7.5 mg total) by mouth daily. As needed for arthritis inflammation     omeprazole 20 MG capsule  Commonly known as:  PRILOSEC  Take 1 capsule (20 mg total) by mouth daily. Take 30 minutes before a meal     pravastatin 40 MG tablet  Commonly known as:  PRAVACHOL  Take 1 tablet (40 mg total) by mouth daily.     sertraline 100 MG tablet  Commonly known as:  ZOLOFT  Take 1 tablet (100 mg total) by mouth daily.     traMADol 50 MG tablet  Commonly known as:  ULTRAM  Take 1 tablet (50 mg total) by mouth every 6 (six) hours as needed for pain.     WOMENS MULTIVITAMIN PLUS PO  Take 1 tablet by mouth daily.           Follow-up Information   Follow up with [name] I, MD.   Specialty:  Urology   Contact information:  [address] [place] [address] [phone]       Follow up with Kathi Ludwig, MD.   Specialty:  Urology   Contact information:   [address] [place] [address] [phone]       Signed: [name] I 08/28/2013, 9:41 AM

## 2013-08-28 NOTE — Progress Notes (Signed)
Foley d/c per order.  Packing  Removed w/ minimal bright red blood noted on pad and packing.  Peripad replaced.  C/o  minimal discomfort

## 2013-09-01 ENCOUNTER — Other Ambulatory Visit: Payer: Self-pay | Admitting: Pulmonary Disease

## 2013-09-24 ENCOUNTER — Ambulatory Visit (INDEPENDENT_AMBULATORY_CARE_PROVIDER_SITE_OTHER): Payer: Medicare Other

## 2013-09-24 DIAGNOSIS — D510 Vitamin B12 deficiency anemia due to intrinsic factor deficiency: Secondary | ICD-10-CM

## 2013-09-25 MED ORDER — CYANOCOBALAMIN 1000 MCG/ML IJ SOLN
1000.0000 ug | Freq: Once | INTRAMUSCULAR | Status: AC
  Administered 2013-09-25: 1000 ug via INTRAMUSCULAR

## 2013-10-18 ENCOUNTER — Telehealth: Payer: Self-pay | Admitting: Pulmonary Disease

## 2013-10-18 ENCOUNTER — Ambulatory Visit (INDEPENDENT_AMBULATORY_CARE_PROVIDER_SITE_OTHER): Payer: Medicare HMO

## 2013-10-18 DIAGNOSIS — D510 Vitamin B12 deficiency anemia due to intrinsic factor deficiency: Secondary | ICD-10-CM

## 2013-10-18 DIAGNOSIS — Z9889 Other specified postprocedural states: Secondary | ICD-10-CM

## 2013-10-18 MED ORDER — MELOXICAM 7.5 MG PO TABS: 7.5000 mg | ORAL_TABLET | Freq: Every day | ORAL | Status: DC

## 2013-10-18 MED ORDER — LISINOPRIL-HYDROCHLOROTHIAZIDE 10-12.5 MG PO TABS: 1.0000 | ORAL_TABLET | Freq: Every day | ORAL | Status: DC

## 2013-10-18 MED ORDER — PRAVASTATIN SODIUM 40 MG PO TABS: ORAL_TABLET | ORAL | Status: DC

## 2013-10-18 MED ORDER — ATENOLOL 25 MG PO TABS: 25.0000 mg | ORAL_TABLET | Freq: Every day | ORAL | Status: DC

## 2013-10-18 MED ORDER — OMEPRAZOLE 20 MG PO CPDR: 20.0000 mg | DELAYED_RELEASE_CAPSULE | Freq: Every day | ORAL | Status: DC

## 2013-10-18 NOTE — Telephone Encounter (Signed)
Called and spoke with pt and she stated that she changed her insurance to [place] this year and she has appt with Dr. Gaynelle Arabian in a couple of weeks, so order has been placed.  Her refills have been sent to [facility] to have refills of these.  Nothing further is needed.

## 2013-10-19 MED ORDER — CYANOCOBALAMIN 1000 MCG/ML IJ SOLN
1000.0000 ug | Freq: Once | INTRAMUSCULAR | Status: AC
  Administered 2013-10-19: 1000 ug via INTRAMUSCULAR

## 2013-11-23 ENCOUNTER — Ambulatory Visit (INDEPENDENT_AMBULATORY_CARE_PROVIDER_SITE_OTHER): Payer: Medicare HMO

## 2013-11-23 DIAGNOSIS — D510 Vitamin B12 deficiency anemia due to intrinsic factor deficiency: Secondary | ICD-10-CM

## 2013-11-26 ENCOUNTER — Other Ambulatory Visit: Payer: Self-pay | Admitting: Urology

## 2013-11-26 ENCOUNTER — Ambulatory Visit: Payer: Medicare HMO | Admitting: Pulmonary Disease

## 2013-11-30 MED ORDER — CYANOCOBALAMIN 1000 MCG/ML IJ SOLN
1000.0000 ug | Freq: Once | INTRAMUSCULAR | Status: AC
  Administered 2013-11-30: 1000 ug via INTRAMUSCULAR

## 2013-12-10 ENCOUNTER — Other Ambulatory Visit: Payer: Self-pay | Admitting: Pulmonary Disease

## 2013-12-10 DIAGNOSIS — R921 Mammographic calcification found on diagnostic imaging of breast: Secondary | ICD-10-CM

## 2013-12-12 ENCOUNTER — Encounter (HOSPITAL_BASED_OUTPATIENT_CLINIC_OR_DEPARTMENT_OTHER): Payer: Self-pay | Admitting: *Deleted

## 2013-12-12 NOTE — Progress Notes (Signed)
NPO AFTER MN.  ARRIVE AT 0930.  NEEDS ISTAT. CURRENT EKG IN EPIC AND CHART. WILL TAKE PRILOSEC, ATENOLOL, AND XANAX  AM DOS W/ SIPS OF WATER.

## 2013-12-17 ENCOUNTER — Encounter (HOSPITAL_BASED_OUTPATIENT_CLINIC_OR_DEPARTMENT_OTHER): Payer: Self-pay | Admitting: *Deleted

## 2013-12-17 ENCOUNTER — Encounter (HOSPITAL_BASED_OUTPATIENT_CLINIC_OR_DEPARTMENT_OTHER): Payer: Medicare HMO | Admitting: Anesthesiology

## 2013-12-17 ENCOUNTER — Encounter (HOSPITAL_BASED_OUTPATIENT_CLINIC_OR_DEPARTMENT_OTHER)
Admission: RE | Disposition: A | Discharge status: Home / Self Care | Payer: Self-pay | Source: Ambulatory Visit | Attending: Urology

## 2013-12-17 ENCOUNTER — Ambulatory Visit (HOSPITAL_BASED_OUTPATIENT_CLINIC_OR_DEPARTMENT_OTHER): Payer: Medicare HMO | Admitting: Anesthesiology

## 2013-12-17 ENCOUNTER — Ambulatory Visit (HOSPITAL_BASED_OUTPATIENT_CLINIC_OR_DEPARTMENT_OTHER)
Admission: RE | Admit: 2013-12-17 | Discharge: 2013-12-17 | Disposition: A | Discharge status: Home / Self Care | Payer: Medicare HMO | Source: Ambulatory Visit | Attending: Urology | Admitting: Urology

## 2013-12-17 DIAGNOSIS — F329 Major depressive disorder, single episode, unspecified: Secondary | ICD-10-CM | POA: Insufficient documentation

## 2013-12-17 DIAGNOSIS — F411 Generalized anxiety disorder: Secondary | ICD-10-CM | POA: Insufficient documentation

## 2013-12-17 DIAGNOSIS — N3946 Mixed incontinence: Secondary | ICD-10-CM | POA: Insufficient documentation

## 2013-12-17 DIAGNOSIS — K219 Gastro-esophageal reflux disease without esophagitis: Secondary | ICD-10-CM | POA: Insufficient documentation

## 2013-12-17 DIAGNOSIS — R32 Unspecified urinary incontinence: Secondary | ICD-10-CM

## 2013-12-17 DIAGNOSIS — N312 Flaccid neuropathic bladder, not elsewhere classified: Secondary | ICD-10-CM | POA: Insufficient documentation

## 2013-12-17 DIAGNOSIS — IMO0002 Reserved for concepts with insufficient information to code with codable children: Secondary | ICD-10-CM | POA: Insufficient documentation

## 2013-12-17 DIAGNOSIS — I10 Essential (primary) hypertension: Secondary | ICD-10-CM | POA: Insufficient documentation

## 2013-12-17 DIAGNOSIS — D510 Vitamin B12 deficiency anemia due to intrinsic factor deficiency: Secondary | ICD-10-CM | POA: Insufficient documentation

## 2013-12-17 DIAGNOSIS — E785 Hyperlipidemia, unspecified: Secondary | ICD-10-CM | POA: Insufficient documentation

## 2013-12-17 DIAGNOSIS — Z87891 Personal history of nicotine dependence: Secondary | ICD-10-CM | POA: Insufficient documentation

## 2013-12-17 DIAGNOSIS — N3642 Intrinsic sphincter deficiency (ISD): Secondary | ICD-10-CM | POA: Insufficient documentation

## 2013-12-17 DIAGNOSIS — Z79899 Other long term (current) drug therapy: Secondary | ICD-10-CM | POA: Insufficient documentation

## 2013-12-17 DIAGNOSIS — F3289 Other specified depressive episodes: Secondary | ICD-10-CM | POA: Insufficient documentation

## 2013-12-17 HISTORY — DX: Unspecified urinary incontinence: R32

## 2013-12-17 HISTORY — DX: Intrinsic sphincter deficiency (ISD): N36.42

## 2013-12-17 HISTORY — PX: CYSTOSCOPY WITH INJECTION: SHX1424

## 2013-12-17 LAB — POCT I-STAT 4, (NA,K, GLUC, HGB,HCT)
GLUCOSE: 95 mg/dL (ref 70–99)
HEMATOCRIT: 36 % (ref 36.0–46.0)
HEMOGLOBIN: 12.2 g/dL (ref 12.0–15.0)
POTASSIUM: 4 meq/L (ref 3.7–5.3)
SODIUM: 141 meq/L (ref 137–147)

## 2013-12-17 SURGERY — CYSTOSCOPY, WITH INJECTION OF BLADDER NECK OR BLADDER WALL
Anesthesia: General | Site: Bladder

## 2013-12-17 MED ORDER — MIDAZOLAM HCL 2 MG/2ML IJ SOLN
INTRAMUSCULAR | Status: AC
  Filled 2013-12-17: qty 2

## 2013-12-17 MED ORDER — LACTATED RINGERS IV SOLN
INTRAVENOUS | Status: DC
  Administered 2013-12-17: 10:00:00 via INTRAVENOUS
  Filled 2013-12-17: qty 1000

## 2013-12-17 MED ORDER — LIDOCAINE HCL (CARDIAC) 20 MG/ML IV SOLN
INTRAVENOUS | Status: DC | PRN
  Administered 2013-12-17: 80 mg via INTRAVENOUS

## 2013-12-17 MED ORDER — ACETAMINOPHEN 10 MG/ML IV SOLN
INTRAVENOUS | Status: DC | PRN
  Administered 2013-12-17: 1000 mg via INTRAVENOUS

## 2013-12-17 MED ORDER — ONDANSETRON HCL 4 MG/2ML IJ SOLN
INTRAMUSCULAR | Status: DC | PRN
  Administered 2013-12-17: 4 mg via INTRAVENOUS

## 2013-12-17 MED ORDER — FENTANYL CITRATE 0.05 MG/ML IJ SOLN
INTRAMUSCULAR | Status: AC
Start: 2013-12-17 — End: 2013-12-17
  Filled 2013-12-17: qty 6

## 2013-12-17 MED ORDER — STERILE WATER FOR IRRIGATION IR SOLN
Status: DC | PRN
  Administered 2013-12-17: 3000 mL

## 2013-12-17 MED ORDER — PROPOFOL 10 MG/ML IV BOLUS
INTRAVENOUS | Status: DC | PRN
  Administered 2013-12-17: 200 mg via INTRAVENOUS

## 2013-12-17 MED ORDER — MIDAZOLAM HCL 5 MG/5ML IJ SOLN
INTRAMUSCULAR | Status: DC | PRN
  Administered 2013-12-17: 1 mg via INTRAVENOUS

## 2013-12-17 MED ORDER — CEFAZOLIN SODIUM-DEXTROSE 2-3 GM-% IV SOLR
2.0000 g | INTRAVENOUS | Status: AC
  Administered 2013-12-17: 2 g via INTRAVENOUS
  Filled 2013-12-17: qty 50

## 2013-12-17 MED ORDER — DEXAMETHASONE SODIUM PHOSPHATE 4 MG/ML IJ SOLN
INTRAMUSCULAR | Status: DC | PRN
Start: 2013-12-17 — End: 2013-12-17
  Administered 2013-12-17: 10 mg via INTRAVENOUS

## 2013-12-17 MED ORDER — PHENAZOPYRIDINE HCL 200 MG PO TABS: 200.0000 mg | ORAL_TABLET | Freq: Three times a day (TID) | ORAL | Status: DC | PRN

## 2013-12-17 MED ORDER — PROMETHAZINE HCL 25 MG/ML IJ SOLN
6.2500 mg | INTRAMUSCULAR | Status: DC | PRN
  Filled 2013-12-17: qty 1

## 2013-12-17 MED ORDER — FENTANYL CITRATE 0.05 MG/ML IJ SOLN
INTRAMUSCULAR | Status: DC | PRN
  Administered 2013-12-17: 50 ug via INTRAVENOUS

## 2013-12-17 MED ORDER — EPHEDRINE SULFATE 50 MG/ML IJ SOLN
INTRAMUSCULAR | Status: DC | PRN
  Administered 2013-12-17: 10 mg via INTRAVENOUS
  Administered 2013-12-17: 5 mg via INTRAVENOUS

## 2013-12-17 MED ORDER — FENTANYL CITRATE 0.05 MG/ML IJ SOLN
25.0000 ug | INTRAMUSCULAR | Status: DC | PRN
  Filled 2013-12-17: qty 1

## 2013-12-17 SURGICAL SUPPLY — 24 items
BAG DRAIN URO-CYSTO SKYTR STRL (DRAIN) ×3 IMPLANT
BAG DRN UROCATH (DRAIN) ×1
BOOTIES KNEE HIGH SLOAN (MISCELLANEOUS) ×1 IMPLANT
CANISTER SUCT LVC 12 LTR MEDI- (MISCELLANEOUS) IMPLANT
CATH ROBINSON RED A/P 12FR (CATHETERS) IMPLANT
CATH ROBINSON RED A/P 14FR (CATHETERS) ×2 IMPLANT
CLOTH BEACON ORANGE TIMEOUT ST (SAFETY) ×3 IMPLANT
DRAPE CAMERA CLOSED 9X96 (DRAPES) ×3 IMPLANT
ELECT REM PT RETURN 9FT ADLT (ELECTROSURGICAL)
ELECTRODE REM PT RTRN 9FT ADLT (ELECTROSURGICAL) IMPLANT
GLOVE BIO SURGEON STRL SZ7.5 (GLOVE) ×3 IMPLANT
GOWN PREVENTION PLUS LG XLONG (DISPOSABLE) ×3 IMPLANT
GOWN STRL REIN XL XLG (GOWN DISPOSABLE) ×3 IMPLANT
INJECTION MACROPLASIQ 2.5ML UN (Female Continence) IMPLANT
MACROPLASTIQUE 2.5ML UNIT (Female Continence) ×9 IMPLANT
NDL RIGID UROPLASTY (NEEDLE) IMPLANT
NDL SAFETY ECLIPSE 18X1.5 (NEEDLE) ×1 IMPLANT
NEEDLE HYPO 18GX1.5 SHARP (NEEDLE) ×3
NEEDLE RIGID UROPLASTY (NEEDLE) ×3 IMPLANT
PACK CYSTOSCOPY (CUSTOM PROCEDURE TRAY) ×3 IMPLANT
SYR 20CC LL (SYRINGE) IMPLANT
SYR BULB IRRIGATION 50ML (SYRINGE) IMPLANT
SYRINGE 10CC LL (SYRINGE) ×6 IMPLANT
WATER STERILE IRR 3000ML UROMA (IV SOLUTION) ×3 IMPLANT

## 2013-12-17 NOTE — Anesthesia Postprocedure Evaluation (Signed)
  Anesthesia Post-op Note  Patient: [name] [name]  Procedure(s) Performed: Procedure(s) (LRB): MACROPLASQTIQUE WITH INJECTION (N/A)  Patient Location: PACU  Anesthesia Type: General  Level of Consciousness: awake and alert   Airway and Oxygen Therapy: Patient Spontanous Breathing  Post-op Pain: mild  Post-op Assessment: Post-op Vital signs reviewed, Patient's Cardiovascular Status Stable, Respiratory Function Stable, Patent Airway and No signs of Nausea or vomiting  Last Vitals:  Filed Vitals:   12/17/13 1215  BP: 103/66  Pulse: 60  Temp:   Resp: 13    Post-op Vital Signs: stable   Complications: No apparent anesthesia complications

## 2013-12-17 NOTE — Anesthesia Procedure Notes (Signed)
Procedure Name: LMA Insertion Date/Time: 12/17/2013 11:20 AM Performed by: Bethena Roys T Pre-anesthesia Checklist: Patient identified, Emergency Drugs available, Suction available and Patient being monitored Patient Re-evaluated:Patient Re-evaluated prior to inductionOxygen Delivery Method: Circle System Utilized Preoxygenation: Pre-oxygenation with 100% oxygen Intubation Type: IV induction Ventilation: Mask ventilation without difficulty LMA: LMA inserted LMA Size: 4.0 Number of attempts: 1 Airway Equipment and Method: bite block Placement Confirmation: positive ETCO2 Dental Injury: Teeth and Oropharynx as per pre-operative assessment

## 2013-12-17 NOTE — Discharge Instructions (Addendum)
CYSTOSCOPY HOME CARE INSTRUCTIONS ° °Activity: °Rest for the remainder of the day.  Do not drive or operate equipment today.  You may resume normal activities in one to two days as instructed by your physician.  ° °Meals: °Drink plenty of liquids and eat light foods such as gelatin or soup this evening.  You may return to a normal meal plan tomorrow. ° °Return to Work: °You may return to work in one to two days or as instructed by your physician. ° °Special Instructions / Symptoms: °Call your physician if any of these symptoms occur: ° ° -persistent or heavy bleeding ° -bleeding which continues after first few urination ° -large blood clots that are difficult to pass ° -urine stream diminishes or stops completely ° -fever equal to or higher than 101 degrees Farenheit. ° -cloudy urine with a strong, foul odor ° -severe pain ° °Females should always wipe from front to back after elimination.  You may feel some burning pain when you urinate.  This should disappear with time.  Applying moist heat to the lower abdomen or a hot tub bath may help relieve the pain. \ ° °Follow-Up / Date of Return Visit to Your Physician:  *** °Call for an appointment to arrange follow-up. ° °Patient Signature:  ________________________________________________________ ° °Nurse's Signature:  ________________________________________________________ ° ° °Post Anesthesia Home Care Instructions ° °Activity: °Get plenty of rest for the remainder of the day. A responsible adult should stay with you for 24 hours following the procedure.  °For the next 24 hours, DO NOT: °-Drive a car °-Operate machinery °-Drink alcoholic beverages °-Take any medication unless instructed by your physician °-Make any legal decisions or sign important papers. ° °Meals: °Start with liquid foods such as gelatin or soup. Progress to regular foods as tolerated. Avoid greasy, spicy, heavy foods. If nausea and/or vomiting occur, drink only clear liquids until the nausea  and/or vomiting subsides. Call your physician if vomiting continues. ° °Special Instructions/Symptoms: °Your throat may feel dry or sore from the anesthesia or the breathing tube placed in your throat during surgery. If this causes discomfort, gargle with warm salt water. The discomfort should disappear within 24 hours. ° °

## 2013-12-17 NOTE — Anesthesia Preprocedure Evaluation (Signed)
Anesthesia Evaluation  Patient identified by MRN, date of birth, ID band Patient awake    Reviewed: Allergy & Precautions, H&P , NPO status , Patient's Chart, lab work & pertinent test results  Airway Mallampati: II TM Distance: >3 FB Neck ROM: Full    Dental no notable dental hx.    Pulmonary neg pulmonary ROS, former smoker,  breath sounds clear to auscultation  Pulmonary exam normal       Cardiovascular hypertension, Pt. on medications Rhythm:Regular Rate:Normal     Neuro/Psych negative neurological ROS  negative psych ROS   GI/Hepatic Neg liver ROS, GERD-  Medicated,  Endo/Other  negative endocrine ROS  Renal/GU negative Renal ROS  negative genitourinary   Musculoskeletal negative musculoskeletal ROS (+)   Abdominal   Peds negative pediatric ROS (+)  Hematology negative hematology ROS (+)   Anesthesia Other Findings   Reproductive/Obstetrics negative OB ROS                           Anesthesia Physical Anesthesia Plan  ASA: II  Anesthesia Plan: General   Post-op Pain Management:    Induction: Intravenous  Airway Management Planned: LMA  Additional Equipment:   Intra-op Plan:   Post-operative Plan: Extubation in OR  Informed Consent: I have reviewed the patients History and Physical, chart, labs and discussed the procedure including the risks, benefits and alternatives for the proposed anesthesia with the patient or authorized representative who has indicated his/her understanding and acceptance.   Dental advisory given  Plan Discussed with: CRNA and Surgeon  Anesthesia Plan Comments:         Anesthesia Quick Evaluation  

## 2013-12-17 NOTE — Interval H&P Note (Signed)
History and Physical Interval Note:  12/17/2013 11:12 AM  [name] [name]  has presented today for surgery, with the diagnosis of ISD  The various methods of treatment have been discussed with the patient and family. After consideration of risks, benefits and other options for treatment, the patient has consented to  Procedure(s): MACROPLASQTIQUE WITH INJECTION (N/A) as a surgical intervention .  The patient's history has been reviewed, patient examined, no change in status, stable for surgery.  I have reviewed the patient's chart and labs.  Questions were answered to the patient's satisfaction.     Carolan Clines I

## 2013-12-17 NOTE — Op Note (Signed)
Pre-operative diagnosis :   Intrinsic sphincter deficiency with urinary incontinence  Postoperative diagnosis: Same    Operation:   Cystourethroscopy, implantation of Macroplastique submucosal transurethral injection (3 vials)   Surgeon:  S. Gaynelle Arabian, MD  First assistant: None     Anesthesia:  General LMA   Preparation:  After appropriate preanesthesia, the patient was brought to the operative room, placed on the operating table in the dorsal supine position where general LMA anesthesia was introduced. She was replaced in the dorsal lithotomy position where the pubis was prepped with Betadine solution and draped in the usual fashion. Armband was double checked. The history was double checked.   Review history:  Problems  1. 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 Urge and stress incontinence (788.33)   Assessed By: Carolan Clines (Urology); Last Assessed: 23 Nov 2013  History of Present Illness  68 YO female returns today for a 2 mo f/u to consider Macroplastique. She has failed Myrbetriq & Sudafed for hx of mixed incontinence. She complains about leaking w/ coughing, laughing, movements, & urge. She is s/p anterior vault suspension with Kelly plication and sacrospinous ligament fixation w/Xeroform bovine dermis graft augmentation, urethral exploration w/urethral lysis, explantation of migrated TVT tape, implantation of FloSeal, implantation of Xeroform bovine graft in periurethral space 08/27/13. Urodynamics shows a VLPP of 65. She is felt to have Intrinsic Sphincter Deficiency.     Statement of  Likelihood of Success: Excellent. TIME-OUT observed.:  Procedure:  Examination under anesthesia revealed urethral hypermobility, with scarring in the mid urethra secondary to prior surgeries. The patient has grade 1-2 cystocele.  Using the injection scope, 3 vials of Macroplastique or injected submucosally, 1 cm distal to the  bladder neck, at the 8:00 position, at the 6 clock position, and at the 4:00 positions. This afforded closure of the bladder neck. A 16 French red Robinson catheter was used to drain the bladder. The patient received IV Tylenol. She was awakened, and taken to recovery room in good condition. There was no bleeding noted.

## 2013-12-17 NOTE — H&P (Signed)
ctive Problems Problems  1. 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 Urge and stress incontinence (788.33)   Assessed By: Carolan Clines (Urology); Last Assessed: 23 Nov 2013  History of Present Illness 68 YO female returns today for a 2 mo f/u to consider Macroplastique. She has failed Myrbetriq & Sudafed for hx of mixed incontinence. She complains about leaking w/ coughing, laughing, movements, & urge. She is s/p anterior vault suspension with Kelly plication and sacrospinous ligament fixation w/Xeroform bovine dermis graft augmentation, urethral exploration w/urethral lysis, explantation of migrated TVT tape, implantation of FloSeal, implantation of Xeroform bovine graft in periurethral space 08/27/13.    She had some post-op incomplete bladder emptying. She was started on Rapaflo & Bethanechol (currently off both medications).     Past Medical History Problems  1. History of Anxiety (300.00) 2. History of Fibromyalgia (729.1) 3. History of Gastric ulcer (531.90) 4. History of depression (V11.8) 5. History of gastritis (V12.79) 6. History of hyperlipidemia (V12.29) 7. History of hypertension (V12.59) 8. History of Irritable bowel syndrome with diarrhea (564.1) 9. 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 History of Vitamin D deficiency (268.9)  Surgical History Problems  1. History of Anterior Colporrhaphy, Repair Of Cystocele 2. History of Colonoscopy (Fiberoptic) 3. History of Enteroscopic Procedures 4. History of Hysterectomy 5. History of Kelly Urethral Plication 6. History of Neck Surgery 7. History of Sacrospinous Ligament Fixation For Posthysterectomy Prolapse 8. History of Tubal Ligation 9. History of Vaginal Surg Insertion Of Prosthesis For Pelvic Floor Repair  Current Meds 1. Atenolol 25 MG Oral Tablet;  Therapy: [phone] to Recorded 2. Cyclobenzaprine HCl - 5 MG Oral Tablet;  Therapy: [id number]  to Recorded 3. Lisinopril-Hydrochlorothiazide 10-12.5 MG Oral Tablet;  Therapy: [phone] to Recorded 4. Meloxicam 7.5 MG Oral Tablet;  Therapy: [phone] to Recorded 5. Omeprazole 20 MG Oral Capsule Delayed Release;  Therapy: [phone] to Recorded 6. Pravastatin Sodium 40 MG Oral Tablet;  Therapy: [phone] to Recorded 7. Sertraline HCl - 100 MG Oral Tablet;  Therapy: [id number] to Recorded 8. Trimethoprim 100 MG Oral Tablet; Take 1 tablet daily;  Therapy: [id number] to (Evaluate:10Feb2015)  Requested for: [id number]; Last  Rx:12Nov2014 Ordered 9. Vitamin B-12 1000 MCG/ML SOLN;  Therapy: (Recorded:24Oct2014) to Recorded  Allergies Medication  1. Morphine Derivatives 2. Sulfa Drugs  Family History Problems  1. Family history of Blood In Urine : Mother 2. Family history of Family Health Status - Father's Age : Father   50yrs, lung ca 3. Family history of Family Health Status - Mother's Age : Mother   34yrs, blood disorder 4. Family history of Family Health Status Number Of Children   1 daughter 5. Family history of Fibromyalgia : Sister 20. Family history of Heart Disease (V17.49) : Father 7. Family history of Nephrolithiasis : Sister 54. Family history of Renal Failure : Sister 75. Family history of Tuberculosis   grandmother  Social History Problems  1. Being A Social Drinker 2. Caffeine Use   1-2 qd 3. Former smoker (V15.82)   quit in 08/2009 4. Marital History - Divorced (V61.03) 5. Occupation:   retired  Review of Systems Genitourinary, constitutional, skin, eye, otolaryngeal, hematologic/lymphatic, cardiovascular, pulmonary, endocrine, musculoskeletal, gastrointestinal, neurological and psychiatric system(s) were reviewed and pertinent findings if present are noted.  Genitourinary: urinary urgency, nocturia, incontinence, urinary hesitancy, urinary stream starts and stops, incomplete emptying of bladder, dyspareunia and initiating urination requires straining,  but no dysuria.  Gastrointestinal: abdominal pain, diarrhea and constipation.  Constitutional: feeling tired (fatigue).  ENT: sore throat and sinus problems.  Musculoskeletal: joint pain.  Neurological: headache.  Psychiatric: depression and anxiety.    Vitals Vital Signs [Data Includes: Last 1 Day]  Recorded: 20Feb2015 02:34PM  Blood Pressure: 147 / 79 Temperature: 98.2 F Heart Rate: 56  Physical Exam Constitutional: Well nourished and well developed . No acute distress.  ENT:. The ears and nose are normal in appearance.  Skin: Normal skin turgor, no visible rash and no visible skin lesions.  Neuro/Psych:. Mood and affect are appropriate.    Results/Data Urine [Data Includes: Last 1 Day]   20Feb2015  COLOR YELLOW   APPEARANCE CLEAR   SPECIFIC GRAVITY 1.025   pH 5.5   GLUCOSE NEG mg/dL  BILIRUBIN NEG   KETONE NEG mg/dL  BLOOD TRACE   PROTEIN NEG mg/dL  UROBILINOGEN 0.2 mg/dL  NITRITE NEG   LEUKOCYTE ESTERASE NEG   SQUAMOUS EPITHELIAL/HPF RARE   WBC NONE SEEN WBC/hpf  RBC 0-2 RBC/hpf  BACTERIA NONE SEEN   CRYSTALS NONE SEEN   CASTS NONE SEEN    Assessment Assessed  1. Urge and stress incontinence (788.33) 2. Dyspareunia, female (625.0)  ( 45 minutes face to face discussion). 68 yo female, with continued incontinence. She has not been sexually active, however, but she has resolved her vaginal pain.We have discussed the original reasons for her hysterectomy, and how that affected her continence mechanism. In addition, we have discussed the findings at her surgery, with "bunching" of the TOT tape to the left of the urethra, with "banjo-stringing" of the urethra.We have reviewed alternatives for her , including types of injectables for incontinence, and also surgical approaches to "closing the doughnut hole".    She has noted that she has joined a group lawsuit regarding mesh complications; and understands that I am concerned that her complication may be from the surgical  technique rather than the mesh qualities. She noted that during her surgery , the bladder was perforated, and that she needed to have a catheter in place for several days post operatively. I do not know the brand of mesh used, or the performing surgeon, but am surprised that she was able to go nine years with the exposed suture under the urethra and the bunching of mesh on the side of the urethra found at the time of surgery.   Although I have performed many slings with mesh since its introduction in 2000, I do not recall any complications involving "bunching" of the mesh on the side of the urethra. It is possible, however, to have the mesh too tight, and have needed to cut the mesh in order to loosen the "backboard". .    Her VLPP of 65 would seem to indicate ISD, although her urodynamics were performed prior to her most recent surgery.   Plan Macroplastique injection. We have discussed the possibility of the need bladder self-catheterizations.   Signatures Electronically signed by : Carolan Clines, M.D.; Nov 23 2013  6:39PM EST

## 2013-12-17 NOTE — Transfer of Care (Signed)
Immediate Anesthesia Transfer of Care Note  Patient: [name] [name]  Procedure(s) Performed: Procedure(s) (LRB): MACROPLASQTIQUE WITH INJECTION (N/A)  Patient Location: PACU  Anesthesia Type: General  Level of Consciousness: awake, oriented, sedated and patient cooperative  Airway & Oxygen Therapy: Patient Spontanous Breathing and Patient connected to face mask oxygen  Post-op Assessment: Report given to PACU RN and Post -op Vital signs reviewed and stable  Post vital signs: Reviewed and stable  Complications: No apparent anesthesia complications

## 2013-12-17 NOTE — Interval H&P Note (Signed)
History and Physical Interval Note:  12/17/2013 11:13 AM  [name] [name]  has presented today for surgery, with the diagnosis of ISD  The various methods of treatment have been discussed with the patient and family. After consideration of risks, benefits and other options for treatment, the patient has consented to  Procedure(s): MACROPLASQTIQUE WITH INJECTION (N/A) as a surgical intervention .  The patient's history has been reviewed, patient examined, no change in status, stable for surgery.  I have reviewed the patient's chart and labs.  Questions were answered to the patient's satisfaction.     Carolan Clines I

## 2013-12-18 ENCOUNTER — Encounter (HOSPITAL_BASED_OUTPATIENT_CLINIC_OR_DEPARTMENT_OTHER): Payer: Self-pay | Admitting: Urology

## 2013-12-27 ENCOUNTER — Telehealth: Payer: Self-pay | Admitting: Pulmonary Disease

## 2013-12-27 NOTE — Telephone Encounter (Signed)
LMTCB

## 2013-12-31 ENCOUNTER — Ambulatory Visit (INDEPENDENT_AMBULATORY_CARE_PROVIDER_SITE_OTHER): Payer: Medicare HMO

## 2013-12-31 DIAGNOSIS — D510 Vitamin B12 deficiency anemia due to intrinsic factor deficiency: Secondary | ICD-10-CM

## 2013-12-31 MED ORDER — ALPRAZOLAM 0.5 MG PO TABS: ORAL_TABLET | ORAL | Status: DC

## 2013-12-31 MED ORDER — SERTRALINE HCL 100 MG PO TABS: 100.0000 mg | ORAL_TABLET | Freq: Every day | ORAL | Status: DC

## 2013-12-31 NOTE — Telephone Encounter (Signed)
RX's printed and placed on SN cart for signature to fax to mail [facility]. Please advise once done thanks

## 2013-12-31 NOTE — Telephone Encounter (Signed)
I spoke with the pt and she states that she needs refills on xanax and zoloft sent to mail order. Pt is also asking that a week supply be sent to [facility] to last until her shipment arrives. I have sent this refill. Pt has an upcoming appt with her new PCP on 01/28/14. Please advise if ok to send 3 month supply on these medications? Reno Bing, CMA

## 2013-12-31 NOTE — Telephone Encounter (Signed)
rx have been signed by SN and will be faxed to the mail order.

## 2013-12-31 NOTE — Telephone Encounter (Signed)
Per SN---  Ok to send in the 3 month supply with no refills.  thanks

## 2014-01-02 MED ORDER — CYANOCOBALAMIN 1000 MCG/ML IJ SOLN
1000.0000 ug | Freq: Once | INTRAMUSCULAR | Status: AC
  Administered 2014-01-02: 1000 ug via INTRAMUSCULAR

## 2014-01-09 ENCOUNTER — Telehealth: Payer: Self-pay | Admitting: Pulmonary Disease

## 2014-01-09 NOTE — Telephone Encounter (Signed)
Our records indicate that we already faxed the alprazolam and zoloft rxs in on 12/31/13  Spoke with the pt and she states that she has called them and they never received  I called [facility] and phoned in both rxs  Pharmacist advised that they went through without need for any exception  Pt aware and states nothing further needed

## 2014-01-18 ENCOUNTER — Ambulatory Visit
Admission: RE | Admit: 2014-01-18 | Discharge: 2014-01-18 | Disposition: A | Discharge status: Home / Self Care | Payer: Medicare HMO | Source: Ambulatory Visit | Attending: Pulmonary Disease | Admitting: Pulmonary Disease

## 2014-01-18 DIAGNOSIS — R921 Mammographic calcification found on diagnostic imaging of breast: Secondary | ICD-10-CM

## 2014-01-28 ENCOUNTER — Encounter: Payer: Self-pay | Admitting: Nurse Practitioner

## 2014-01-28 ENCOUNTER — Ambulatory Visit (INDEPENDENT_AMBULATORY_CARE_PROVIDER_SITE_OTHER): Payer: Medicare HMO | Admitting: Nurse Practitioner

## 2014-01-28 VITALS — BP 121/71 | HR 59 | Temp 98.0°F | Ht 66.0 in | Wt 191.0 lb

## 2014-01-28 DIAGNOSIS — L989 Disorder of the skin and subcutaneous tissue, unspecified: Secondary | ICD-10-CM

## 2014-01-28 DIAGNOSIS — E785 Hyperlipidemia, unspecified: Secondary | ICD-10-CM

## 2014-01-28 DIAGNOSIS — I10 Essential (primary) hypertension: Secondary | ICD-10-CM

## 2014-01-28 DIAGNOSIS — F418 Other specified anxiety disorders: Secondary | ICD-10-CM

## 2014-01-28 DIAGNOSIS — Z0000 Encounter for general adult medical examination without abnormal findings: Secondary | ICD-10-CM

## 2014-01-28 DIAGNOSIS — M19049 Primary osteoarthritis, unspecified hand: Secondary | ICD-10-CM

## 2014-01-28 DIAGNOSIS — E559 Vitamin D deficiency, unspecified: Secondary | ICD-10-CM

## 2014-01-28 DIAGNOSIS — F341 Dysthymic disorder: Secondary | ICD-10-CM

## 2014-01-28 DIAGNOSIS — R32 Unspecified urinary incontinence: Secondary | ICD-10-CM

## 2014-01-28 DIAGNOSIS — Z1329 Encounter for screening for other suspected endocrine disorder: Secondary | ICD-10-CM

## 2014-01-28 DIAGNOSIS — IMO0001 Reserved for inherently not codable concepts without codable children: Secondary | ICD-10-CM

## 2014-01-28 DIAGNOSIS — D510 Vitamin B12 deficiency anemia due to intrinsic factor deficiency: Secondary | ICD-10-CM

## 2014-01-28 DIAGNOSIS — K589 Irritable bowel syndrome without diarrhea: Secondary | ICD-10-CM

## 2014-01-28 MED ORDER — TRAMADOL HCL 50 MG PO TABS: 50.0000 mg | ORAL_TABLET | Freq: Four times a day (QID) | ORAL | Status: DC | PRN

## 2014-01-28 MED ORDER — LISINOPRIL-HYDROCHLOROTHIAZIDE 10-12.5 MG PO TABS: 1.0000 | ORAL_TABLET | Freq: Every day | ORAL | Status: DC

## 2014-01-28 MED ORDER — OMEPRAZOLE 20 MG PO CPDR: 20.0000 mg | DELAYED_RELEASE_CAPSULE | Freq: Every morning | ORAL | Status: DC

## 2014-01-28 MED ORDER — ATENOLOL 25 MG PO TABS: 25.0000 mg | ORAL_TABLET | Freq: Every morning | ORAL | Status: DC

## 2014-01-28 MED ORDER — PRAVASTATIN SODIUM 40 MG PO TABS: ORAL_TABLET | ORAL | Status: DC

## 2014-01-28 MED ORDER — DICLOFENAC SODIUM 1 % TD GEL: TRANSDERMAL | Status: DC

## 2014-01-28 NOTE — Progress Notes (Signed)
Pre visit review using our clinic review tool, if applicable. No additional management support is needed unless otherwise documented below in the visit note. 

## 2014-01-28 NOTE — Patient Instructions (Signed)
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 In addition you may 1000 mg tylenol with mobic. twice daily with food. Natural anti-inflammatories can be used as well: Fresh ginger tea daily (grate 1-2 TBLS fresh ginger in & steep in hot water for 5 minutes); handful tart cherries or 1/2 cup tart cherry juice daily; 2-3 curmarin capsules daily. Start 1 supplement at a time & take for 2 weeks before adding a second or third, if needed. This office will call you with lab results and any needed follow up.     Osteoarthritis Osteoarthritis is a disease that causes soreness and swelling (inflammation) of a joint. It occurs when the cartilage at the affected joint wears down. Cartilage acts as a cushion, covering the ends of bones where they meet to form a joint. Osteoarthritis is the most common form of arthritis. It often occurs in older people. The joints affected most often by this condition include those in the:  Ends of the fingers.  Thumbs.  Neck.  Lower back.  Knees.  Hips. CAUSES  Over time, the cartilage that covers the ends of bones begins to wear away. This causes bone to rub on bone, producing pain and stiffness in the affected joints.  RISK FACTORS Certain factors can increase your chances of having osteoarthritis, including:  Older age.  Excessive body weight.  Overuse of joints. SIGNS AND SYMPTOMS   Pain, swelling, and stiffness in the joint.  Over time, the joint may lose its normal shape.  Small deposits of bone (osteophytes) may grow on the edges of the joint.  Bits of bone or cartilage can break off and float inside the joint space. This may cause more pain and damage. DIAGNOSIS  Your health care provider will do a physical exam and ask about your symptoms. Various tests may be ordered, such as:  X-rays of the affected joint.  An MRI scan.  Blood tests to rule out other types of arthritis.  Joint fluid tests. This involves using a needle to draw  fluid from the joint and examining the fluid under a microscope. TREATMENT  Goals of treatment are to control pain and improve joint function. Treatment plans may include:  A prescribed exercise program that allows for rest and joint relief.  A weight control plan.  Pain relief techniques, such as:  Properly applied heat and cold.  Electric pulses delivered to nerve endings under the skin (transcutaneous electrical nerve stimulation, TENS).  Massage.  Certain nutritional supplements.  Medicines to control pain, such as:  Acetaminophen.  Nonsteroidal anti-inflammatory drugs (NSAIDs), such as naproxen.  Narcotic or central-acting agents, such as tramadol.  Corticosteroids. These can be given orally or as an injection.  Surgery to reposition the bones and relieve pain (osteotomy) or to remove loose pieces of bone and cartilage. Joint replacement may be needed in advanced states of osteoarthritis. HOME CARE INSTRUCTIONS   Only take over-the-counter or prescription medicines as directed by your health care provider. Take all medicines exactly as instructed.  Maintain a healthy weight. Follow your health care provider's instructions for weight control. This may include dietary instructions.  Exercise as directed. Your health care provider can recommend specific types of exercise. These may include:  Strengthening exercises These are done to strengthen the muscles that support joints affected by arthritis. They can be performed with weights or with exercise bands to add resistance.  Aerobic activities These are exercises, such as brisk walking or low-impact aerobics, that get your heart pumping.  Range-of-motion activities These keep your joints limber.  Balance and agility exercises These help you maintain daily living skills.  Rest your affected joints as directed by your health care provider.  Follow up with your health care provider as directed. SEEK MEDICAL CARE IF:    Your skin turns red.  You develop a rash in addition to your joint pain.  You have worsening joint pain. SEEK IMMEDIATE MEDICAL CARE IF:  You have a significant loss of weight or appetite.  You have a fever along with joint or muscle aches.  You have night sweats. [place] of Arthritis and Musculoskeletal and Skin Diseases: www.niams.SouthExposed.es [employer] on Aging: http://kim-miller.com/ American College of Rheumatology: www.rheumatology.org Document Released: 09/20/2005 Document Revised: 07/11/2013 Document Reviewed: 05/28/2013 [facility] Patient Information 2014 [place], [place].   Preventive Care for Adults, Female A healthy lifestyle and preventive care can promote health and wellness. Preventive health guidelines for women include the following key practices.  A routine yearly physical is a good way to check with your health care provider about your health and preventive screening. It is a chance to share any concerns and updates on your health and to receive a thorough exam.  Visit your dentist for a routine exam and preventive care every 6 months. Brush your teeth twice a day and floss once a day. Good oral hygiene prevents tooth decay and gum disease.  The frequency of eye exams is based on your age, health, family medical history, use of contact lenses, and other factors. Follow your health care provider's recommendations for frequency of eye exams.  Eat a healthy diet. Foods like vegetables, fruits, whole grains, low-fat dairy products, and lean protein foods contain the nutrients you need without too many calories. Decrease your intake of foods high in solid fats, added sugars, and salt. Eat the right amount of calories for you.Get information about a proper diet from your health care provider, if necessary.  Regular physical exercise is one of the most important things you can do for your health. Most adults should get at least 150 minutes of  moderate-intensity exercise (any activity that increases your heart rate and causes you to sweat) each week. In addition, most adults need muscle-strengthening exercises on 2 or more days a week.  Maintain a healthy weight. The body mass index (BMI) is a screening tool to identify possible weight problems. It provides an estimate of body fat based on height and weight. Your health care provider can find your BMI, and can help you achieve or maintain a healthy weight.For adults 20 years and older:  A BMI below 18.5 is considered underweight.  A BMI of 18.5 to 24.9 is normal.  A BMI of 25 to 29.9 is considered overweight.  A BMI of 30 and above is considered obese.  Maintain normal blood lipids and cholesterol levels by exercising and minimizing your intake of saturated fat. Eat a balanced diet with plenty of fruit and vegetables. Blood tests for lipids and cholesterol should begin at age 94 and be repeated every 5 years. If your lipid or cholesterol levels are high, you are over 50, or you are at high risk for heart disease, you may need your cholesterol levels checked more frequently.Ongoing high lipid and cholesterol levels should be treated with medicines if diet and exercise are not working.  If you smoke, find out from your health care provider how to quit. If you do not use tobacco, do not start.  Lung cancer  screening is recommended for adults aged 106 80 years who are at high risk for developing lung cancer because of a history of smoking. A yearly low-dose CT scan of the lungs is recommended for people who have at least a 30-pack-year history of smoking and are a current smoker or have quit within the past 15 years. A pack year of smoking is smoking an average of 1 pack of cigarettes a day for 1 year (for example: 1 pack a day for 30 years or 2 packs a day for 15 years). Yearly screening should continue until the smoker has stopped smoking for at least 15 years. Yearly screening should be  stopped for people who develop a health problem that would prevent them from having lung cancer treatment.  If you are pregnant, do not drink alcohol. If you are breastfeeding, be very cautious about drinking alcohol. If you are not pregnant and choose to drink alcohol, do not have more than 1 drink per day. One drink is considered to be 12 ounces (355 mL) of beer, 5 ounces (148 mL) of wine, or 1.5 ounces (44 mL) of liquor.  Avoid use of street drugs. Do not share needles with anyone. Ask for help if you need support or instructions about stopping the use of drugs.  High blood pressure causes heart disease and increases the risk of stroke. Your blood pressure should be checked at least every 1 to 2 years. Ongoing high blood pressure should be treated with medicines if weight loss and exercise do not work.  If you are 40 67 years old, ask your health care provider if you should take aspirin to prevent strokes.  Diabetes screening involves taking a blood sample to check your fasting blood sugar level. This should be done once every 3 years, after age 99, if you are within normal weight and without risk factors for diabetes. Testing should be considered at a younger age or be carried out more frequently if you are overweight and have at least 1 risk factor for diabetes.  Breast cancer screening is essential preventive care for women. You should practice "breast self-awareness." This means understanding the normal appearance and feel of your breasts and may include breast self-examination. Any changes detected, no matter how small, should be reported to a health care provider. Women in their 69s and 30s should have a clinical breast exam (CBE) by a health care provider as part of a regular health exam every 1 to 3 years. After age 63, women should have a CBE every year. Starting at age 27, women should consider having a mammogram (breast X-ray test) every year. Women who have a family history of breast  cancer should talk to their health care provider about genetic screening. Women at a high risk of breast cancer should talk to their health care providers about having an MRI and a mammogram every year.  Breast cancer gene (BRCA)-related cancer risk assessment is recommended for women who have family members with BRCA-related cancers. BRCA-related cancers include breast, ovarian, tubal, and peritoneal cancers. Having family members with these cancers may be associated with an increased risk for harmful changes (mutations) in the breast cancer genes BRCA1 and BRCA2. Results of the assessment will determine the need for genetic counseling and BRCA1 and BRCA2 testing.  The Pap test is a screening test for cervical cancer. A Pap test can show cell changes on the cervix that might become cervical cancer if left untreated. A Pap test is a procedure in  which cells are obtained and examined from the lower end of the uterus (cervix).  Women should have a Pap test starting at age 69.  Between ages 80 and 65, Pap tests should be repeated every 2 years.  Beginning at age 55, you should have a Pap test every 3 years as long as the past 3 Pap tests have been normal.  Some women have medical problems that increase the chance of getting cervical cancer. Talk to your health care provider about these problems. It is especially important to talk to your health care provider if a new problem develops soon after your last Pap test. In these cases, your health care provider may recommend more frequent screening and Pap tests.  The above recommendations are the same for women who have or have not gotten the vaccine for human papillomavirus (HPV).  If you had a hysterectomy for a problem that was not cancer or a condition that could lead to cancer, then you no longer need Pap tests. Even if you no longer need a Pap test, a regular exam is a good idea to make sure no other problems are starting.  If you are between ages 24  and 28 years, and you have had normal Pap tests going back 10 years, you no longer need Pap tests. Even if you no longer need a Pap test, a regular exam is a good idea to make sure no other problems are starting.  If you have had past treatment for cervical cancer or a condition that could lead to cancer, you need Pap tests and screening for cancer for at least 20 years after your treatment.  If Pap tests have been discontinued, risk factors (such as a new sexual partner) need to be reassessed to determine if screening should be resumed.  The HPV test is an additional test that may be used for cervical cancer screening. The HPV test looks for the virus that can cause the cell changes on the cervix. The cells collected during the Pap test can be tested for HPV. The HPV test could be used to screen women aged 64 years and older, and should be used in women of any age who have unclear Pap test results. After the age of 52, women should have HPV testing at the same frequency as a Pap test.  Colorectal cancer can be detected and often prevented. Most routine colorectal cancer screening begins at the age of 38 years and continues through age 51 years. However, your health care provider may recommend screening at an earlier age if you have risk factors for colon cancer. On a yearly basis, your health care provider may provide home test kits to check for hidden blood in the stool. Use of a small camera at the end of a tube, to directly examine the colon (sigmoidoscopy or colonoscopy), can detect the earliest forms of colorectal cancer. Talk to your health care provider about this at age 25, when routine screening begins. Direct exam of the colon should be repeated every 5 10 years through age 92 years, unless early forms of pre-cancerous polyps or small growths are found.  People who are at an increased risk for hepatitis B should be screened for this virus. You are considered at high risk for hepatitis B  if:  You were born in a country where hepatitis B occurs often. Talk with your health care provider about which countries are considered high risk.  Your parents were born in a high-risk country and  you have not received a shot to protect against hepatitis B (hepatitis B vaccine).  You have HIV or AIDS.  You use needles to inject street drugs.  You live with, or have sex with, someone who has Hepatitis B.  You get hemodialysis treatment.  You take certain medicines for conditions like cancer, organ transplantation, and autoimmune conditions.  Hepatitis C blood testing is recommended for all people born from [birth year] through 1964 and any individual with known risks for hepatitis C.  Practice safe sex. Use condoms and avoid high-risk sexual practices to reduce the spread of sexually transmitted infections (STIs). STIs include gonorrhea, chlamydia, syphilis, trichomonas, herpes, HPV, and human immunodeficiency virus (HIV). Herpes, HIV, and HPV are viral illnesses that have no cure. They can result in disability, cancer, and death. Sexually active women aged 47 years and younger should be checked for chlamydia. Older women with new or multiple partners should also be tested for chlamydia. Testing for other STIs is recommended if you are sexually active and at increased risk.  Osteoporosis is a disease in which the bones lose minerals and strength with aging. This can result in serious bone fractures or breaks. The risk of osteoporosis can be identified using a bone density scan. Women ages 7 years and over and women at risk for fractures or osteoporosis should discuss screening with their health care providers. Ask your health care provider whether you should take a calcium supplement or vitamin D to reduce the rate of osteoporosis.  Menopause can be associated with physical symptoms and risks. Hormone replacement therapy is available to decrease symptoms and risks. You should talk to your health care  provider about whether hormone replacement therapy is right for you.  Use sunscreen. Apply sunscreen liberally and repeatedly throughout the day. You should seek shade when your shadow is shorter than you. Protect yourself by wearing long sleeves, pants, a wide-brimmed hat, and sunglasses year round, whenever you are outdoors.  Once a month, do a whole body skin exam, using a mirror to look at the skin on your back. Tell your health care provider of new moles, moles that have irregular borders, moles that are larger than a pencil eraser, or moles that have changed in shape or color.  Stay current with required vaccines (immunizations).  Influenza vaccine. All adults should be immunized every year.  Tetanus, diphtheria, and acellular pertussis (Td, Tdap) vaccine. Pregnant women should receive 1 dose of Tdap vaccine during each pregnancy. The dose should be obtained regardless of the length of time since the last dose. Immunization is preferred during the 27th 36th week of gestation. An adult who has not previously received Tdap or who does not know her vaccine status should receive 1 dose of Tdap. This initial dose should be followed by tetanus and diphtheria toxoids (Td) booster doses every 10 years. Adults with an unknown or incomplete history of completing a 3-dose immunization series with Td-containing vaccines should begin or complete a primary immunization series including a Tdap dose. Adults should receive a Td booster every 10 years.  Varicella vaccine. An adult without evidence of immunity to varicella should receive 2 doses or a second dose if she has previously received 1 dose. Pregnant females who do not have evidence of immunity should receive the first dose after pregnancy. This first dose should be obtained before leaving the health care facility. The second dose should be obtained 4 8 weeks after the first dose.  Human papillomavirus (HPV) vaccine. Females aged 29 26  years who have not  received the vaccine previously should obtain the 3-dose series. The vaccine is not recommended for use in pregnant females. However, pregnancy testing is not needed before receiving a dose. If a female is found to be pregnant after receiving a dose, no treatment is needed. In that case, the remaining doses should be delayed until after the pregnancy. Immunization is recommended for any person with an immunocompromised condition through the age of 32 years if she did not get any or all doses earlier. During the 3-dose series, the second dose should be obtained 4 8 weeks after the first dose. The third dose should be obtained 24 weeks after the first dose and 16 weeks after the second dose.  Zoster vaccine. One dose is recommended for adults aged 74 years or older unless certain conditions are present.  Measles, mumps, and rubella (MMR) vaccine. Adults born before [birth year] generally are considered immune to measles and mumps. Adults born in [birth year] or later should have 1 or more doses of MMR vaccine unless there is a contraindication to the vaccine or there is laboratory evidence of immunity to each of the three diseases. A routine second dose of MMR vaccine should be obtained at least 28 days after the first dose for students attending postsecondary schools, health care workers, or international travelers. People who received inactivated measles vaccine or an unknown type of measles vaccine during 1963 1967 should receive 2 doses of MMR vaccine. People who received inactivated mumps vaccine or an unknown type of mumps vaccine before 1979 and are at high risk for mumps infection should consider immunization with 2 doses of MMR vaccine. For females of childbearing age, rubella immunity should be determined. If there is no evidence of immunity, females who are not pregnant should be vaccinated. If there is no evidence of immunity, females who are pregnant should delay immunization until after pregnancy. Unvaccinated  health care workers born before [birth year] who lack laboratory evidence of measles, mumps, or rubella immunity or laboratory confirmation of disease should consider measles and mumps immunization with 2 doses of MMR vaccine or rubella immunization with 1 dose of MMR vaccine.  Pneumococcal 13-valent conjugate (PCV13) vaccine. When indicated, a person who is uncertain of her immunization history and has no record of immunization should receive the PCV13 vaccine. An adult aged 49 years or older who has certain medical conditions and has not been previously immunized should receive 1 dose of PCV13 vaccine. This PCV13 should be followed with a dose of pneumococcal polysaccharide (PPSV23) vaccine. The PPSV23 vaccine dose should be obtained at least 8 weeks after the dose of PCV13 vaccine. An adult aged 63 years or older who has certain medical conditions and previously received 1 or more doses of PPSV23 vaccine should receive 1 dose of PCV13. The PCV13 vaccine dose should be obtained 1 or more years after the last PPSV23 vaccine dose.  Pneumococcal polysaccharide (PPSV23) vaccine. When PCV13 is also indicated, PCV13 should be obtained first. All adults aged 35 years and older should be immunized. An adult younger than age 46 years who has certain medical conditions should be immunized. Any person who resides in a nursing home or long-term care facility should be immunized. An adult smoker should be immunized. People with an immunocompromised condition and certain other conditions should receive both PCV13 and PPSV23 vaccines. People with human immunodeficiency virus (HIV) infection should be immunized as soon as possible after diagnosis. Immunization during chemotherapy or radiation therapy should be avoided. Routine  use of PPSV23 vaccine is not recommended for American Indians, [place] Natives, or people younger than 65 years unless there are medical conditions that require PPSV23 vaccine. When indicated, people who have  unknown immunization and have no record of immunization should receive PPSV23 vaccine. One-time revaccination 5 years after the first dose of PPSV23 is recommended for people aged 45 64 years who have chronic kidney failure, nephrotic syndrome, asplenia, or immunocompromised conditions. People who received 1 2 doses of PPSV23 before age 12 years should receive another dose of PPSV23 vaccine at age 24 years or later if at least 5 years have passed since the previous dose. Doses of PPSV23 are not needed for people immunized with PPSV23 at or after age 80 years.  Meningococcal vaccine. Adults with asplenia or persistent complement component deficiencies should receive 2 doses of quadrivalent meningococcal conjugate (MenACWY-D) vaccine. The doses should be obtained at least 2 months apart. Microbiologists working with certain meningococcal bacteria, [employer] recruits, people at risk during an outbreak, and people who travel to or live in countries with a high rate of meningitis should be immunized. A first-year college student up through age 75 years who is living in a residence hall should receive a dose if she did not receive a dose on or after her 16th birthday. Adults who have certain high-risk conditions should receive one or more doses of vaccine.  Hepatitis A vaccine. Adults who wish to be protected from this disease, have certain high-risk conditions, work with hepatitis A-infected animals, work in hepatitis A research labs, or travel to or work in countries with a high rate of hepatitis A should be immunized. Adults who were previously unvaccinated and who anticipate close contact with an international adoptee during the first 60 days after arrival in the [place] from a country with a high rate of hepatitis A should be immunized.  Hepatitis B vaccine. Adults who wish to be protected from this disease, have certain high-risk conditions, may be exposed to blood or other infectious body fluids, are  household contacts or sex partners of hepatitis B positive people, are clients or workers in certain care facilities, or travel to or work in countries with a high rate of hepatitis B should be immunized.  Haemophilus influenzae type b (Hib) vaccine. A previously unvaccinated person with asplenia or sickle cell disease or having a scheduled splenectomy should receive 1 dose of Hib vaccine. Regardless of previous immunization, a recipient of a hematopoietic stem cell transplant should receive a 3-dose series 6 12 months after her successful transplant. Hib vaccine is not recommended for adults with HIV infection. Preventive Services / Frequency Ages 5 years and over  Blood pressure check.** / Every 1 to 2 years.  Lipid and cholesterol check.** / Every 5 years beginning at age 30 years.  Lung cancer screening. / Every year if you are aged 34 80 years and have a 30-pack-year history of smoking and currently smoke or have quit within the past 15 years. Yearly screening is stopped once you have quit smoking for at least 15 years or develop a health problem that would prevent you from having lung cancer treatment.  Clinical breast exam.** / Every year after age 27 years.  BRCA-related cancer risk assessment.** / For women who have family members with a BRCA-related cancer (breast, ovarian, tubal, or peritoneal cancers).  Mammogram.** / Every year beginning at age 100 years and continuing for as long as you are in good health. Consult with your health care provider.  Pap test.** / Every 3 years starting at age 51 years through age 21 or 46 years with 3 consecutive normal Pap tests. Testing can be stopped between 65 and 70 years with 3 consecutive normal Pap tests and no abnormal Pap or HPV tests in the past 10 years.  HPV screening.** / Every 3 years from ages 54 years through ages 64 or 61 years with a history of 3 consecutive normal Pap tests. Testing can be stopped between 65 and 70 years with 3  consecutive normal Pap tests and no abnormal Pap or HPV tests in the past 10 years.  Fecal occult blood test (FOBT) of stool. / Every year beginning at age 57 years and continuing until age 61 years. You may not need to do this test if you get a colonoscopy every 10 years.  Flexible sigmoidoscopy or colonoscopy.** / Every 5 years for a flexible sigmoidoscopy or every 10 years for a colonoscopy beginning at age 91 years and continuing until age 40 years.  Hepatitis C blood test.** / For all people born from [birth year] through 1964 and any individual with known risks for hepatitis C.  Osteoporosis screening.** / A one-time screening for women ages 46 years and over and women at risk for fractures or osteoporosis.  Skin self-exam. / Monthly.  Influenza vaccine. / Every year.  Tetanus, diphtheria, and acellular pertussis (Tdap/Td) vaccine.** / 1 dose of Td every 10 years.  Varicella vaccine.** / Consult your health care provider.  Zoster vaccine.** / 1 dose for adults aged 47 years or older.  Pneumococcal 13-valent conjugate (PCV13) vaccine.** / Consult your health care provider.  Pneumococcal polysaccharide (PPSV23) vaccine.** / 1 dose for all adults aged 34 years and older.  Meningococcal vaccine.** / Consult your health care provider.  Hepatitis A vaccine.** / Consult your health care provider.  Hepatitis B vaccine.** / Consult your health care provider.  Haemophilus influenzae type b (Hib) vaccine.** / Consult your health care provider. ** Family history and personal history of risk and conditions may change your health care provider's recommendations. Document Released: 11/16/2001 Document Revised: 07/11/2013 Document Reviewed: 02/15/2011 [facility] Patient Information 2014 [place], [place].

## 2014-01-29 ENCOUNTER — Telehealth: Payer: Self-pay | Admitting: Nurse Practitioner

## 2014-01-29 LAB — LIPID PANEL
Cholesterol: 165 mg/dL (ref 0–200)
HDL: 43.7 mg/dL (ref >39.00)
LDL CALC: 70 mg/dL (ref 0–99)
Total CHOL/HDL Ratio: 4
Triglycerides: 255 mg/dL — ABNORMAL HIGH (ref 0.0–149.0)
VLDL: 51 mg/dL — AB (ref 0.0–40.0)

## 2014-01-29 LAB — MICROALBUMIN / CREATININE URINE RATIO
Creatinine,U: 120.2 mg/dL
Microalb Creat Ratio: 0.4 mg/g (ref 0.0–30.0)
Microalb, Ur: 0.5 mg/dL (ref 0.0–1.9)

## 2014-01-29 LAB — URINALYSIS, ROUTINE W REFLEX MICROSCOPIC
Bilirubin Urine: NEGATIVE
Ketones, ur: NEGATIVE
Leukocytes, UA: NEGATIVE
NITRITE: NEGATIVE
SPECIFIC GRAVITY, URINE: 1.025 (ref 1.000–1.030)
TOTAL PROTEIN, URINE-UPE24: NEGATIVE
Urine Glucose: NEGATIVE
Urobilinogen, UA: 0.2 (ref 0.0–1.0)
pH: 6 (ref 5.0–8.0)

## 2014-01-29 LAB — COMPREHENSIVE METABOLIC PANEL
ALK PHOS: 64 U/L (ref 39–117)
ALT: 15 U/L (ref 0–35)
AST: 20 U/L (ref 0–37)
Albumin: 4.2 g/dL (ref 3.5–5.2)
BILIRUBIN TOTAL: 0.3 mg/dL (ref 0.3–1.2)
BUN: 19 mg/dL (ref 6–23)
CO2: 29 mEq/L (ref 19–32)
Calcium: 10.1 mg/dL (ref 8.4–10.5)
Chloride: 102 mEq/L (ref 96–112)
Creatinine, Ser: 0.9 mg/dL (ref 0.4–1.2)
GFR: 63.94 mL/min (ref >60.00)
Glucose, Bld: 89 mg/dL (ref 70–99)
Potassium: 3.7 mEq/L (ref 3.5–5.1)
SODIUM: 139 meq/L (ref 135–145)
TOTAL PROTEIN: 6.9 g/dL (ref 6.0–8.3)

## 2014-01-29 LAB — CBC
HEMATOCRIT: 33.2 % — AB (ref 36.0–46.0)
HEMOGLOBIN: 10.8 g/dL — AB (ref 12.0–15.0)
MCHC: 32.6 g/dL (ref 30.0–36.0)
MCV: 90.2 fl (ref 78.0–100.0)
Platelets: 284 10*3/uL (ref 150.0–400.0)
RBC: 3.68 Mil/uL — ABNORMAL LOW (ref 3.87–5.11)
RDW: 14.8 % — ABNORMAL HIGH (ref 11.5–14.6)
WBC: 7.2 10*3/uL (ref 4.5–10.5)

## 2014-01-29 LAB — VITAMIN D 25 HYDROXY (VIT D DEFICIENCY, FRACTURES): Vit D, 25-Hydroxy: 35 ng/mL (ref 30–89)

## 2014-01-29 LAB — HEMOGLOBIN A1C: HEMOGLOBIN A1C: 4.9 % (ref 4.6–6.5)

## 2014-01-29 LAB — TSH: TSH: 1.85 u[IU]/mL (ref 0.35–5.50)

## 2014-01-29 LAB — VITAMIN B12: Vitamin B-12: 551 pg/mL (ref 211–911)

## 2014-01-29 NOTE — Telephone Encounter (Signed)
Relevant patient education assigned to patient using Emmi. ° °

## 2014-01-30 ENCOUNTER — Encounter: Payer: Self-pay | Admitting: Nurse Practitioner

## 2014-01-30 ENCOUNTER — Telehealth: Payer: Self-pay | Admitting: Nurse Practitioner

## 2014-01-30 DIAGNOSIS — Z0000 Encounter for general adult medical examination without abnormal findings: Secondary | ICD-10-CM | POA: Insufficient documentation

## 2014-01-30 DIAGNOSIS — D649 Anemia, unspecified: Secondary | ICD-10-CM

## 2014-01-30 DIAGNOSIS — L989 Disorder of the skin and subcutaneous tissue, unspecified: Secondary | ICD-10-CM | POA: Insufficient documentation

## 2014-01-30 DIAGNOSIS — R32 Unspecified urinary incontinence: Secondary | ICD-10-CM

## 2014-01-30 HISTORY — DX: Unspecified urinary incontinence: R32

## 2014-01-30 NOTE — Assessment & Plan Note (Signed)
B12 & CBC today

## 2014-01-30 NOTE — Assessment & Plan Note (Signed)
Vaccines UTD MMG UTD DEXA in 2016

## 2014-01-30 NOTE — Assessment & Plan Note (Addendum)
Uses xanax for insomnia also. Takes qhs. & about 2/mo when feels anxious, described as heart beating fast. Meds effective. Continue.

## 2014-01-30 NOTE — Assessment & Plan Note (Signed)
Symptoms well controlled.  Continue bentyl. Start probiotic daily.

## 2014-01-30 NOTE — Assessment & Plan Note (Signed)
Vit D today 

## 2014-01-30 NOTE — Assessment & Plan Note (Signed)
Ref derm for eval of lesion on back.

## 2014-01-30 NOTE — Telephone Encounter (Signed)
Left message on pt's home vm to return call concerning results.

## 2014-01-30 NOTE — Assessment & Plan Note (Signed)
R 1st pip joint swollen. Voltaren gel 3 times daily. Consider RA work up if no improvement.

## 2014-01-30 NOTE — Assessment & Plan Note (Signed)
Has to wear pads 24/7.  Continue to follow Dr Gaynelle Arabian.

## 2014-01-30 NOTE — Progress Notes (Signed)
Subjective:     [name] [name] is a 67 y.o. female who presents for follow up of chronic conditions. Dr Lenna Gilford has been PCP for many years. She is currently treated for HTN, pernicious anemia, hyperlipidemia, Vitamin D deficiency, fibromyalgia, anxiety, bladder incontinence, anxiety & depression, reflux, IBS. She c/o ongoing joint pain in hands. Pain started years ago. Mobic helps, but R 1st pip is swollen & painful.  The following portions of the patient's history were reviewed and updated as appropriate: allergies, current medications, past medical history, past social history, past surgical history and problem list.  Review of Systems Constitutional: negative for chills, fatigue, fevers and weight loss Eyes: positive for dry eye, uses drops. Ears, nose, mouth, throat, and face: negative Respiratory: negative for cough, dyspnea on exertion and wheezing Cardiovascular: positive for occasional palpitations not associated w/SOB, cough, nausea, or dizziness. Gastrointestinal: negative for abdominal pain and change in bowel habits Genitourinary:positive for urinary incontinence Integument/breast: negative Musculoskeletal:positive for arthralgias, myalgias and stiff joints Neurological: negative Behavioral/Psych: positive for anxiety, depression and sleep disturbance, negative for excessive alcohol consumption and illegal drug usage    Objective:    BP 121/71  Pulse 59  Temp(Src) 98 F (36.7 C) (Temporal)  Ht 5\' 6"  (1.676 m)  Wt 191 lb (86.637 kg)  BMI 30.84 kg/m2  SpO2 96% BP 121/71  Pulse 59  Temp(Src) 98 F (36.7 C) (Temporal)  Ht 5\' 6"  (1.676 m)  Wt 191 lb (86.637 kg)  BMI 30.84 kg/m2  SpO2 96% General appearance: alert, cooperative, appears stated age and no distress Head: Normocephalic, without obvious abnormality, atraumatic Eyes: negative findings: lids and lashes normal, conjunctivae and sclerae normal, corneas clear and pupils equal, round, reactive to light and  accomodation Ears: normal TM's and external ear canals both ears Nose: Nares normal. Septum midline. Mucosa normal. No drainage or sinus tenderness. Throat: lips, mucosa, and tongue normal; teeth and gums normal Neck: no adenopathy, no carotid bruit, supple, symmetrical, trachea midline and thyroid not enlarged, symmetric, no tenderness/mass/nodules Lungs: clear to auscultation bilaterally Heart: regular rate and rhythm, S1, S2 normal, no murmur, click, rub or gallop Abdomen: soft, non-tender; bowel sounds normal; no masses,  no organomegaly Extremities: extremities normal, atraumatic, no cyanosis or edema and varicose veins noted Pulses: 2+ and symmetric Skin: multi-colored mole on back, about 4 mm, irreg border. Multiple large seborrheic keratoses on abdomen Lymph nodes: Cervical, supraclavicular, and axillary nodes normal. Neurologic: Grossly normal    Assessment:     1. Pernicious anemia  Vitamin B12 CBC 2. Preventative health care - Hemoglobin A1c - Urinalysis, Routine w reflex microscopic -TSH 3. Essential hypertension, benign - Lipid panel - Hemoglobin A1c - Comprehensive metabolic panel - Microalbumin / creatinine urine ratio - Urinalysis, Routine w reflex microscopic 4. Screening for thyroid disorder - TSH 5. Urinary incontinence - Ambulatory referral to Urology 6. Skin lesion of back - Ambulatory referral to Dermatology 7. Osteoarthritis, hand - diclofenac sodium (VOLTAREN) 1 % GEL; Apply dime sized amount to finger 2-3 times daily.  Dispense: 100 g; Refill: 1 8. VITAMIN D DEFICIENCY Vit D today 9. HYPERLIPIDEMIA lipids 10. FIBROMYALGIA 11. Irritable bowel syndrome 12. Depression with anxiety   Plan:   See problem list for complete A&P

## 2014-01-30 NOTE — Telephone Encounter (Signed)
pls call pt: Advise Concerned about low hemoglobin-mild anemia. Want to check stool for blood-ask her to come by for stool cards.  B12 looks good, but she can take oral B complex 2 droppersful daily. OK to still come in for IM inj q mo. Vit D still on low side-take D3 2000 iu daily. Cholesterol looks good, but triglycerdies a little high-ask if she was fasting. If not, she should cut out refined sugar.

## 2014-01-30 NOTE — Assessment & Plan Note (Addendum)
Lipids today. Continue current meds.

## 2014-01-30 NOTE — Assessment & Plan Note (Signed)
Continue current meds. Daily stretches.

## 2014-01-30 NOTE — Assessment & Plan Note (Signed)
Good control. No evidence of end-organ damage. Continue meds. CMET, urine micro today.

## 2014-01-31 ENCOUNTER — Telehealth: Payer: Self-pay | Admitting: Family Medicine

## 2014-01-31 ENCOUNTER — Encounter: Payer: Self-pay | Admitting: Nurse Practitioner

## 2014-01-31 NOTE — Telephone Encounter (Signed)
Pt would like a referral to [facility].  Please advise.

## 2014-01-31 NOTE — Telephone Encounter (Signed)
Pt requests [facility].

## 2014-02-04 NOTE — Telephone Encounter (Signed)
Patient returned call and was given results. Patient stated that she was not fasting the day she had labs drawn. Patient also requested referral to [facility].

## 2014-02-04 NOTE — Telephone Encounter (Signed)
You will see a referral for urology in the referral tab- it was placed the day of her appt. Might want to ask Dianne to check on it.

## 2014-02-05 NOTE — Telephone Encounter (Signed)
Pt told me, her insurance requires a referral every so often. So, I know she is already seeing urology. I ordered the referral because pt said her insurance requested it.

## 2014-02-05 NOTE — Telephone Encounter (Signed)
Patient has been scheduled at [facility]. I have mailed a letter with the appointment information.

## 2014-02-05 NOTE — Telephone Encounter (Signed)
Diane will you check on this referral. Thanks

## 2014-02-05 NOTE — Telephone Encounter (Signed)
I SW Chasity at [facility] She said patient already has an appointment 02/11/14 to follow-up on a recent surgery. Chasity will call the patient to confirm whether she will need a separate appt

## 2014-02-06 ENCOUNTER — Other Ambulatory Visit: Payer: Self-pay | Admitting: Nurse Practitioner

## 2014-02-06 DIAGNOSIS — M810 Age-related osteoporosis without current pathological fracture: Secondary | ICD-10-CM

## 2014-02-06 DIAGNOSIS — M8580 Other specified disorders of bone density and structure, unspecified site: Secondary | ICD-10-CM

## 2014-02-06 HISTORY — DX: Age-related osteoporosis without current pathological fracture: M81.0

## 2014-02-06 NOTE — Telephone Encounter (Signed)
[employer] does require me to send a referral to [facility]. These don't require you to put a referral in EPIC, just let the patient know to mention it to me & I'll send it in.

## 2014-02-13 ENCOUNTER — Other Ambulatory Visit: Payer: Medicare HMO

## 2014-02-13 DIAGNOSIS — D649 Anemia, unspecified: Secondary | ICD-10-CM

## 2014-02-13 LAB — HEMOCCULT SLIDES (X 3 CARDS)
FECAL OCCULT BLD: NEGATIVE
OCCULT 1: NEGATIVE
OCCULT 2: NEGATIVE
OCCULT 3: NEGATIVE
OCCULT 4: NEGATIVE
OCCULT 5: NEGATIVE

## 2014-02-13 NOTE — Telephone Encounter (Signed)
pls call pt: Advise No blood in stool to cause anemia. I want to check iron studies. Pls ask her to drop in for lab appt. Schedule & advise time.

## 2014-02-14 ENCOUNTER — Other Ambulatory Visit: Payer: Medicare HMO

## 2014-02-14 DIAGNOSIS — D649 Anemia, unspecified: Secondary | ICD-10-CM

## 2014-02-14 LAB — IRON AND TIBC
%SAT: 22 % (ref 20–55)
Iron: 68 ug/dL (ref 42–145)
TIBC: 310 ug/dL (ref 250–470)
UIBC: 242 ug/dL (ref 125–400)

## 2014-02-14 NOTE — Telephone Encounter (Signed)
Called pt, gave results and scheduled lab appt for 02/14/2014 at 10:30 am.

## 2014-02-19 ENCOUNTER — Telehealth: Payer: Self-pay | Admitting: Nurse Practitioner

## 2014-02-19 NOTE — Telephone Encounter (Signed)
LMOVM for pt to return call 

## 2014-02-19 NOTE — Telephone Encounter (Signed)
  Mildly anemic: Hx pernicious anemia, B12 nml, getting monthly b12 inj. Folate deficient? No blood in stool, iron studies nml, trace blood in urine.  pls call pt: Advise mild anemia, not sure cause as B12 level is nml, no blood in stool, iron count nml. She may be folate deficient. Start a daily Vit B complex that has 1gm (7353 mcg) folic acid. If she cannot find that much folic acid in B complex, get folic acid by itself. Schedule OV for 3 mos. I will check blood again with G99 & folic acid levels.

## 2014-02-20 NOTE — Telephone Encounter (Signed)
Patient notified

## 2014-02-20 NOTE — Telephone Encounter (Signed)
Possibly. If she truly has pernicious anemia, she may have chronic gastritis causing crampy feeling. Folic acid may help. We'll see.

## 2014-02-20 NOTE — Telephone Encounter (Signed)
Patient returned call and was given results. Patient wanted to know if being folate deficient could have anything to do with stomach cramps or an achy feeling in her stomach?

## 2014-02-27 ENCOUNTER — Ambulatory Visit (INDEPENDENT_AMBULATORY_CARE_PROVIDER_SITE_OTHER): Payer: Medicare HMO | Admitting: *Deleted

## 2014-02-27 ENCOUNTER — Other Ambulatory Visit: Payer: Self-pay | Admitting: Nurse Practitioner

## 2014-02-27 DIAGNOSIS — E538 Deficiency of other specified B group vitamins: Secondary | ICD-10-CM

## 2014-02-27 MED ORDER — CYANOCOBALAMIN 1000 MCG/ML IJ SOLN
1000.0000 ug | Freq: Once | INTRAMUSCULAR | Status: AC
  Administered 2014-02-27: 1000 ug via INTRAMUSCULAR

## 2014-02-28 NOTE — Progress Notes (Signed)
Patient notified

## 2014-03-07 ENCOUNTER — Encounter: Payer: Self-pay | Admitting: Nurse Practitioner

## 2014-03-08 ENCOUNTER — Other Ambulatory Visit: Payer: Self-pay | Admitting: Pulmonary Disease

## 2014-03-27 ENCOUNTER — Ambulatory Visit (INDEPENDENT_AMBULATORY_CARE_PROVIDER_SITE_OTHER): Payer: Medicare HMO

## 2014-03-27 DIAGNOSIS — E538 Deficiency of other specified B group vitamins: Secondary | ICD-10-CM

## 2014-03-27 MED ORDER — CYANOCOBALAMIN 1000 MCG/ML IJ SOLN
1000.0000 ug | Freq: Once | INTRAMUSCULAR | Status: AC
  Administered 2014-03-27: 1000 ug via INTRAMUSCULAR

## 2014-03-27 NOTE — Progress Notes (Signed)
   Subjective:    Patient ID: [name] [name], female    DOB: 1946-08-02, 68 y.o.   MRN: [id number]  HPI    Review of Systems     Objective:   Physical Exam        Assessment & Plan:  Vit B12 injection TA/CMA

## 2014-04-08 ENCOUNTER — Telehealth: Payer: Self-pay | Admitting: *Deleted

## 2014-04-08 ENCOUNTER — Other Ambulatory Visit: Payer: Self-pay | Admitting: *Deleted

## 2014-04-08 NOTE — Telephone Encounter (Signed)
Patient is requesting refill for sertraline be sent into [facility]. Please advise refill?

## 2014-04-08 NOTE — Telephone Encounter (Signed)
Patient wanted to let you know that her insurance will pay for pneumonia vaccine. Patient is requesting vaccine be given at her next b-12 nurse visit, which is 04/26/2014. Please advise?

## 2014-04-09 MED ORDER — SERTRALINE HCL 100 MG PO TABS: 100.0000 mg | ORAL_TABLET | Freq: Every day | ORAL | Status: DC

## 2014-04-09 NOTE — Telephone Encounter (Signed)
She needs to keep July appointment with me.

## 2014-04-09 NOTE — Telephone Encounter (Signed)
Left detailed message on pt's home vm informing her of inj and also about ov appt.

## 2014-04-09 NOTE — Telephone Encounter (Signed)
She may get pneumococcal 13. NOT 23. Thanks!

## 2014-04-16 ENCOUNTER — Encounter: Payer: Self-pay | Admitting: Internal Medicine

## 2014-04-16 ENCOUNTER — Other Ambulatory Visit: Payer: Self-pay | Admitting: *Deleted

## 2014-04-16 ENCOUNTER — Encounter: Payer: Self-pay | Admitting: Nurse Practitioner

## 2014-04-16 ENCOUNTER — Ambulatory Visit: Payer: Medicare HMO | Admitting: Nurse Practitioner

## 2014-04-16 VITALS — BP 118/77 | HR 77 | Temp 98.5°F | Ht 66.0 in | Wt 187.0 lb

## 2014-04-16 DIAGNOSIS — F32A Depression, unspecified: Secondary | ICD-10-CM

## 2014-04-16 DIAGNOSIS — F341 Dysthymic disorder: Secondary | ICD-10-CM

## 2014-04-16 DIAGNOSIS — F418 Other specified anxiety disorders: Secondary | ICD-10-CM

## 2014-04-16 DIAGNOSIS — F329 Major depressive disorder, single episode, unspecified: Secondary | ICD-10-CM

## 2014-04-16 DIAGNOSIS — F3289 Other specified depressive episodes: Secondary | ICD-10-CM

## 2014-04-16 DIAGNOSIS — D510 Vitamin B12 deficiency anemia due to intrinsic factor deficiency: Secondary | ICD-10-CM

## 2014-04-16 DIAGNOSIS — D6489 Other specified anemias: Secondary | ICD-10-CM

## 2014-04-16 DIAGNOSIS — J069 Acute upper respiratory infection, unspecified: Secondary | ICD-10-CM

## 2014-04-16 HISTORY — DX: Other specified anemias: D64.89

## 2014-04-16 MED ORDER — SERTRALINE HCL 100 MG PO TABS: 100.0000 mg | ORAL_TABLET | Freq: Every day | ORAL | Status: DC

## 2014-04-16 NOTE — Progress Notes (Signed)
Pre visit review using our clinic review tool, if applicable. No additional management support is needed unless otherwise documented below in the visit note. 

## 2014-04-16 NOTE — Telephone Encounter (Signed)
Resent rx for sertraline to [facility] by normal route.

## 2014-04-16 NOTE — Assessment & Plan Note (Signed)
Continue liquid oral B12 to get 2400 mcg daily. Stop IM inj to see if oral supplement will maintain level above 400. Check level in Oct.

## 2014-04-16 NOTE — Patient Instructions (Addendum)
You have a virus causing your symptoms. The average duration of symptoms is 14 days. Start daily sinus rinses (neilmed Sinus Rinse). Continue mucinex. Sip fluids every hour. Rest. If you are not feeling better in 1 week or develop fever or chest pain, call us for re-evaluation. Get colonoscopy. No B12 injections for a few months. Continue oral B12. Get pneumonia vaccine once feeling better. We will have flu vaccines in September.  Feel better!  Upper Respiratory Infection, Adult An upper respiratory infection (URI) is also sometimes known as the common cold. The upper respiratory tract includes the nose, sinuses, throat, trachea, and bronchi. Bronchi are the airways leading to the lungs. Most people improve within 1 week, but symptoms can last up to 2 weeks. A residual cough may last even longer.  CAUSES Many different viruses can infect the tissues lining the upper respiratory tract. The tissues become irritated and inflamed and often become very moist. Mucus production is also common. A cold is contagious. You can easily spread the virus to others by oral contact. This includes kissing, sharing a glass, coughing, or sneezing. Touching your mouth or nose and then touching a surface, which is then touched by another person, can also spread the virus. SYMPTOMS  Symptoms typically develop 1 to 3 days after you come in contact with a cold virus. Symptoms vary from person to person. They may include:  Runny nose.  Sneezing.  Nasal congestion.  Sinus irritation.  Sore throat.  Loss of voice (laryngitis).  Cough.  Fatigue.  Muscle aches.  Loss of appetite.  Headache.  Low-grade fever. DIAGNOSIS  You might diagnose your own cold based on familiar symptoms, since most people get a cold 2 to 3 times a year. Your caregiver can confirm this based on your exam. Most importantly, your caregiver can check that your symptoms are not due to another disease such as strep throat, sinusitis,  pneumonia, asthma, or epiglottitis. Blood tests, throat tests, and X-rays are not necessary to diagnose a common cold, but they may sometimes be helpful in excluding other more serious diseases. Your caregiver will decide if any further tests are required. RISKS AND COMPLICATIONS  You may be at risk for a more severe case of the common cold if you smoke cigarettes, have chronic heart disease (such as heart failure) or lung disease (such as asthma), or if you have a weakened immune system. The very young and very old are also at risk for more serious infections. Bacterial sinusitis, middle ear infections, and bacterial pneumonia can complicate the common cold. The common cold can worsen asthma and chronic obstructive pulmonary disease (COPD). Sometimes, these complications can require emergency medical care and may be life-threatening. PREVENTION  The best way to protect against getting a cold is to practice good hygiene. Avoid oral or hand contact with people with cold symptoms. Wash your hands often if contact occurs. There is no clear evidence that vitamin C, vitamin E, echinacea, or exercise reduces the chance of developing a cold. However, it is always recommended to get plenty of rest and practice good nutrition. TREATMENT  Treatment is directed at relieving symptoms. There is no cure. Antibiotics are not effective, because the infection is caused by a virus, not by bacteria. Treatment may include:  Increased fluid intake. Sports drinks offer valuable electrolytes, sugars, and fluids.  Breathing heated mist or steam (vaporizer or shower).  Eating chicken soup or other clear broths, and maintaining good nutrition.  Getting plenty of rest.  Using gargles  or lozenges for comfort.  Controlling fevers with ibuprofen or acetaminophen as directed by your caregiver.  Increasing usage of your inhaler if you have asthma. Zinc gel and zinc lozenges, taken in the first 24 hours of the common cold, can  shorten the duration and lessen the severity of symptoms. Pain medicines may help with fever, muscle aches, and throat pain. A variety of non-prescription medicines are available to treat congestion and runny nose. Your caregiver can make recommendations and may suggest nasal or lung inhalers for other symptoms.  HOME CARE INSTRUCTIONS   Only take over-the-counter or prescription medicines for pain, discomfort, or fever as directed by your caregiver.  Use a warm mist humidifier or inhale steam from a shower to increase air moisture. This may keep secretions moist and make it easier to breathe.  Drink enough water and fluids to keep your urine clear or pale yellow.  Rest as needed.  Return to work when your temperature has returned to normal or as your caregiver advises. You may need to stay home longer to avoid infecting others. You can also use a face mask and careful hand washing to prevent spread of the virus. SEEK MEDICAL CARE IF:   After the first few days, you feel you are getting worse rather than better.  You need your caregiver's advice about medicines to control symptoms.  You develop chills, worsening shortness of breath, or brown or red sputum. These may be signs of pneumonia.  You develop yellow or brown nasal discharge or pain in the face, especially when you bend forward. These may be signs of sinusitis.  You develop a fever, swollen neck glands, pain with swallowing, or white areas in the back of your throat. These may be signs of strep throat. SEEK IMMEDIATE MEDICAL CARE IF:   You have a fever.  You develop severe or persistent headache, ear pain, sinus pain, or chest pain.  You develop wheezing, a prolonged cough, cough up blood, or have a change in your usual mucus (if you have chronic lung disease).  You develop sore muscles or a stiff neck. Document Released: 03/16/2001 Document Revised: 12/13/2011 Document Reviewed: 01/22/2011 [facility] Patient Information 2014  [place], [place].

## 2014-04-16 NOTE — Assessment & Plan Note (Signed)
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 Alternatively, she has mild hemoglobinuria. She is post bladder mesh 02/2013. Mother had myelodysplatic syndrome, mat aunt w/anemia, brother w/anemia. Will consider heme/onc ref if colo nml. Will refer to GI for further eval.

## 2014-04-16 NOTE — Assessment & Plan Note (Signed)
Symptom mngmt. Mucinex, saline rinses. F/u PRN or sooner if fever or chest pain.

## 2014-04-16 NOTE — Assessment & Plan Note (Signed)
Continue zoloft. Pt states if she runs out of med for few days, she cries easily. Refilled med today-30 day script at local [place] day script sent to mail order pharm. She ran out of med yesterday.

## 2014-04-16 NOTE — Progress Notes (Signed)
Subjective:     [name] [name] is a 68 y.o. female presents with cough & nasal congestion. She has had symptoms for 3 days. No fever or chest pain. Cough is not keeping her awake. She fears developing pneumonia, stating she had it twice in past, when she was a smoker. She quit smoking 4 years ago. [name] was ill last week with similar symptoms.  We also discussed anemia, depression, vit D deficiency, & episodes of stomach pain.  Re anemia: she has strong family Hx of anemia w/B12 deficiency.Her Hgb has been stable for many years while getting B12 injections monthly, until last year when she had a drop in Hgb prior to bladder surgery. She has had another drop recently, although B12 & iron studies are nml. Stool studies neg for blood. Urine has mild hgb. She is due for colonoscopy. She has Hx of IBS treated w/bentyl. She reports occasional episodes of intense abdominal pain across mid-abdomen, lasting 2-3 days. Pain is relieved after 2-3 days if takes bentyl.   The following portions of the patient's history were reviewed and updated as appropriate: allergies, current medications, past family history, past medical history, past social history, past surgical history and problem list.  Review of Systems Pertinent items are noted in HPI.    Objective:    BP 118/77  Pulse 77  Temp(Src) 98.5 F (36.9 C) (Oral)  Ht '5\' 6"'  (1.676 m)  Wt 187 lb (84.823 kg)  BMI 30.20 kg/m2  SpO2 95% BP 118/77  Pulse 77  Temp(Src) 98.5 F (36.9 C) (Oral)  Ht '5\' 6"'  (1.676 m)  Wt 187 lb (84.823 kg)  BMI 30.20 kg/m2  SpO2 95% General appearance: alert, cooperative, appears stated age and no distress Head: Normocephalic, without obvious abnormality, atraumatic Eyes: negative findings: lids and lashes normal and conjunctivae and sclerae normal Ears: normal TM's and external ear canals both ears Throat: lips, mucosa, and tongue normal; teeth and gums normal Neck: no adenopathy, supple, symmetrical, trachea  midline and thyroid not enlarged, symmetric, no tenderness/mass/nodules Lungs: clear to auscultation bilaterally Heart: regular rate and rhythm, S1, S2 normal, no murmur, click, rub or gallop Lymph nodes: no cervical LAD    Assessment:   1. Depression  2. Anemia due to other cause - Ambulatory referral to Gastroenterology  3. Acute upper respiratory infection mucinex & sinus rinses Ret for pneumonia vaccine when well.  See problem list for complete A&P See pt instructions. Ref to GI. F/u PRN. Has f/u appt. in Oct.

## 2014-04-26 ENCOUNTER — Ambulatory Visit: Payer: Medicare HMO

## 2014-05-15 ENCOUNTER — Ambulatory Visit (INDEPENDENT_AMBULATORY_CARE_PROVIDER_SITE_OTHER): Payer: Medicare HMO | Admitting: *Deleted

## 2014-05-15 DIAGNOSIS — Z23 Encounter for immunization: Secondary | ICD-10-CM

## 2014-05-20 ENCOUNTER — Other Ambulatory Visit: Payer: Self-pay | Admitting: Nurse Practitioner

## 2014-05-20 ENCOUNTER — Encounter: Payer: Self-pay | Admitting: Nurse Practitioner

## 2014-05-20 DIAGNOSIS — Z832 Family history of diseases of the blood and blood-forming organs and certain disorders involving the immune mechanism: Secondary | ICD-10-CM

## 2014-05-20 DIAGNOSIS — D649 Anemia, unspecified: Secondary | ICD-10-CM

## 2014-05-20 DIAGNOSIS — IMO0001 Reserved for inherently not codable concepts without codable children: Secondary | ICD-10-CM

## 2014-05-20 NOTE — Progress Notes (Signed)
LMOVM for pt to return call. Patient returned call and scheduled lab appt. Pt will schedule ov at lab appt.

## 2014-05-24 ENCOUNTER — Other Ambulatory Visit (INDEPENDENT_AMBULATORY_CARE_PROVIDER_SITE_OTHER): Payer: Medicare HMO

## 2014-05-24 DIAGNOSIS — Z820 Family history of epilepsy and other diseases of the nervous system: Secondary | ICD-10-CM

## 2014-05-24 DIAGNOSIS — IMO0001 Reserved for inherently not codable concepts without codable children: Secondary | ICD-10-CM

## 2014-05-24 DIAGNOSIS — D649 Anemia, unspecified: Secondary | ICD-10-CM

## 2014-05-24 DIAGNOSIS — Z832 Family history of diseases of the blood and blood-forming organs and certain disorders involving the immune mechanism: Secondary | ICD-10-CM

## 2014-05-25 LAB — CBC WITH DIFFERENTIAL/PLATELET
BASOS ABS: 0 10*3/uL (ref 0.0–0.1)
Basophils Relative: 0.4 % (ref 0.0–3.0)
EOS ABS: 0.2 10*3/uL (ref 0.0–0.7)
Eosinophils Relative: 2.7 % (ref 0.0–5.0)
HCT: 35.5 % — ABNORMAL LOW (ref 36.0–46.0)
Hemoglobin: 11.8 g/dL — ABNORMAL LOW (ref 12.0–15.0)
LYMPHS PCT: 33.7 % (ref 12.0–46.0)
Lymphs Abs: 2.8 10*3/uL (ref 0.7–4.0)
MCHC: 33.1 g/dL (ref 30.0–36.0)
MCV: 88 fl (ref 78.0–100.0)
Monocytes Absolute: 0.5 10*3/uL (ref 0.1–1.0)
Monocytes Relative: 6 % (ref 3.0–12.0)
NEUTROS PCT: 57.2 % (ref 43.0–77.0)
Neutro Abs: 4.7 10*3/uL (ref 1.4–7.7)
PLATELETS: 320 10*3/uL (ref 150.0–400.0)
RBC: 4.04 Mil/uL (ref 3.87–5.11)
RDW: 13.8 % (ref 11.5–15.5)
WBC: 8.2 10*3/uL (ref 4.0–10.5)

## 2014-05-25 LAB — VITAMIN B12: Vitamin B-12: >1500 pg/mL — ABNORMAL HIGH (ref 211–911)

## 2014-05-27 LAB — PATHOLOGIST SMEAR REVIEW

## 2014-05-30 ENCOUNTER — Telehealth: Payer: Self-pay | Admitting: Nurse Practitioner

## 2014-05-30 DIAGNOSIS — Z832 Family history of diseases of the blood and blood-forming organs and certain disorders involving the immune mechanism: Secondary | ICD-10-CM

## 2014-05-30 DIAGNOSIS — D6489 Other specified anemias: Secondary | ICD-10-CM

## 2014-05-30 DIAGNOSIS — IMO0001 Reserved for inherently not codable concepts without codable children: Secondary | ICD-10-CM

## 2014-05-30 DIAGNOSIS — R799 Abnormal finding of blood chemistry, unspecified: Secondary | ICD-10-CM | POA: Insufficient documentation

## 2014-05-30 NOTE — Telephone Encounter (Signed)
B12 stores are more than adequate on oral b complex: 1500. Pt will stop injections & remain on oral B complex.   Hgb mildly low, improved since started oral iron & b complex: was 10.8, now 11.8.  Peripheral smear shows rare elliptocytes, polychromasia, & reactive lymphocytes. Pt has family hx of thalassemia, leukemia and myelodysplasia. Although smear is not overly concerning, given her personal Hx of chronic anemia & family Hx of blood ca,  I want her to see heme/onc for further eval.  [id number] elliptocytosis, iron deficiency anemia, thalassemia, bone marrow reaction   Pt requests [facility].  Discussed w/pt, answered all questions. Will make referral.

## 2014-06-19 ENCOUNTER — Telehealth: Payer: Self-pay

## 2014-06-19 NOTE — Telephone Encounter (Signed)
Pt states when she doesn't see them for a long time, it's like starting over. I believe if she doesn't see them within a year. Just FYI

## 2014-06-19 NOTE — Telephone Encounter (Signed)
Please call her ins co & ask why I am being asked to give her a referral every time she has an appt. W/urology. She has been seeing urology for years.??? Thanks.

## 2014-06-19 NOTE — Telephone Encounter (Signed)
Pt called and needs a new referral to see a urologist. She already has an appt.

## 2014-06-19 NOTE — Telephone Encounter (Signed)
Pt called wanting to know the status of her hematology referral.

## 2014-06-19 NOTE — Telephone Encounter (Signed)
Left message for patient to CB. Dr. Marin Olp has been on vacation, referrals should be done by the end of the week.

## 2014-06-20 NOTE — Telephone Encounter (Signed)
I think pt has misinformation. Please call ins co & ask how frequently she needs a referral if seeing a specialist for ongoing problem. She saw urology within last 6 mos. & I did a referral then.

## 2014-06-20 NOTE — Telephone Encounter (Signed)
Left message for pt to call back  °

## 2014-06-21 NOTE — Telephone Encounter (Signed)
I tried calling [employer] yesterday and they hung up on me, will try again when time permits.

## 2014-06-26 ENCOUNTER — Other Ambulatory Visit (INDEPENDENT_AMBULATORY_CARE_PROVIDER_SITE_OTHER): Payer: Commercial Managed Care - HMO

## 2014-06-26 ENCOUNTER — Ambulatory Visit (INDEPENDENT_AMBULATORY_CARE_PROVIDER_SITE_OTHER): Payer: Commercial Managed Care - HMO | Admitting: Internal Medicine

## 2014-06-26 ENCOUNTER — Encounter: Payer: Self-pay | Admitting: Internal Medicine

## 2014-06-26 VITALS — BP 100/66 | HR 64 | Ht 66.0 in | Wt 187.0 lb

## 2014-06-26 DIAGNOSIS — D6489 Other specified anemias: Secondary | ICD-10-CM

## 2014-06-26 DIAGNOSIS — N3642 Intrinsic sphincter deficiency (ISD): Secondary | ICD-10-CM

## 2014-06-26 DIAGNOSIS — K648 Other hemorrhoids: Secondary | ICD-10-CM | POA: Insufficient documentation

## 2014-06-26 HISTORY — DX: Intrinsic sphincter deficiency (ISD): N36.42

## 2014-06-26 HISTORY — DX: Other hemorrhoids: K64.8

## 2014-06-26 LAB — CBC WITH DIFFERENTIAL/PLATELET
BASOS ABS: 0 10*3/uL (ref 0.0–0.1)
Basophils Relative: 0.5 % (ref 0.0–3.0)
EOS PCT: 2.5 % (ref 0.0–5.0)
Eosinophils Absolute: 0.2 10*3/uL (ref 0.0–0.7)
HCT: 35.6 % — ABNORMAL LOW (ref 36.0–46.0)
Hemoglobin: 11.8 g/dL — ABNORMAL LOW (ref 12.0–15.0)
LYMPHS PCT: 30.9 % (ref 12.0–46.0)
Lymphs Abs: 2.6 10*3/uL (ref 0.7–4.0)
MCHC: 33.2 g/dL (ref 30.0–36.0)
MCV: 87.4 fl (ref 78.0–100.0)
MONOS PCT: 8.1 % (ref 3.0–12.0)
Monocytes Absolute: 0.7 10*3/uL (ref 0.1–1.0)
NEUTROS PCT: 58 % (ref 43.0–77.0)
Neutro Abs: 4.8 10*3/uL (ref 1.4–7.7)
PLATELETS: 317 10*3/uL (ref 150.0–400.0)
RBC: 4.07 Mil/uL (ref 3.87–5.11)
RDW: 14 % (ref 11.5–15.5)
WBC: 8.3 10*3/uL (ref 4.0–10.5)

## 2014-06-26 LAB — FERRITIN: Ferritin: 108 ng/mL (ref 10.0–291.0)

## 2014-06-26 NOTE — Assessment & Plan Note (Signed)
Continue dicyclomine

## 2014-06-26 NOTE — Assessment & Plan Note (Addendum)
Review prior endoscopy reports Check CBC,ferritin Not clear that endoscopic evaluation needed now - probably not but depends upon prior endoscopic evaluation results  Lab Results  Component Value Date   WBC 8.3 06/26/2014   HGB 11.8* 06/26/2014   HCT 35.6* 06/26/2014   MCV 87.4 06/26/2014   PLT 317.0 06/26/2014   Lab Results  Component Value Date   FERRITIN 108.0 06/26/2014

## 2014-06-26 NOTE — Progress Notes (Signed)
Subjective:    Patient ID: [name] [name], female    DOB: 1946-08-05, 68 y.o.   MRN: [id number]  HPI This is a very nice lady with a recent diagnosis of anemia, sent by Ms. Weaver to have GI evaluation. Hgb slightly low to NL over the years. Recently Hgb 10 to 11 in a month after starting ferrous sulfate. She reports having years of IBS sxs which have been helped by anti-spasmodics. Stress clearly triggers this. Occasional constipation reported. She has long-standing hemorrhoids that swell and do bleed slightly at times and prolapse. She does not sit and strain too much.  PP qd does control heartburn. Takes Mobic daily.  She has a pressure in anterior rectal area at times since having bladder suspension surgery last year.   She reports having a colonoscopy in approximately 2013 and also an EGD - sounds like [facility] did this. GI ROS o/w negative.  Medications, allergies, past medical history, past surgical history, family history and social history are reviewed and updated in the EMR.    Review of Systems + allergies, anxiety, back pain, fatigue, urinary frequency All other ROS negative    Objective:   Physical Exam General:  Well-developed, well-nourished and in no acute distress Eyes:  anicteric. ENT:   Mouth and posterior pharynx free of lesions.  Neck:   supple w/o thyromegaly or mass.  Lungs: Clear to auscultation bilaterally. Heart:  S1S2, no rubs, murmurs, gallops. Abdomen:  soft, non-tender, no hepatosplenomegaly, hernia, or mass and BS+.  Rectal:  Female staff present   Anal tags No mass or rectocele Hard brown stool NL resting tone, good squeeze, appropriate abdominal CTR and descent  Lymph:  no cervical or supraclavicular adenopathy. Extremities:   no edema Skin   no rash. Neuro:  A&O x 3.  Psych:  appropriate mood and  Affect.  Anoscopy was performed with the patient in the left lateral decubitus position while a chaperone was present and revealed Gr 2  internal hemorrhoids all positions  PROCEDURE NOTE: The patient presents with symptomatic grade 2  hemorrhoids, requesting rubber band ligation of his/her hemorrhoidal disease.  All risks, benefits and alternative forms of therapy were described and informed consent was obtained.  The decision was made to band the RP internal hemorrhoid, and the [place] was used to perform band ligation without complication.  Digital anorectal examination was then performed to assure proper positioning of the band, and to adjust the banded tissue as required.  The patient was discharged home without pain or other issues.  Dietary and behavioral recommendations were given and along with follow-up instructions.     The following adjunctive treatments were recommended:   The patient will return 2-4 weeks for  follow-up and possible additional banding as required. No complications were encountered and the patient tolerated the procedure well   Data Reviewed: Office notes Labs Imaging Will request recent colonoscopy reports     Assessment & Plan:  Anemia due to other cause Review prior endoscopy reports Check CBC,ferritin Not clear that endoscopic evaluation needed now - probably not but depends upon prior endoscopic evaluation results  Lab Results  Component Value Date   WBC 8.3 06/26/2014   HGB 11.8* 06/26/2014   HCT 35.6* 06/26/2014   MCV 87.4 06/26/2014   PLT 317.0 06/26/2014   Lab Results  Component Value Date   FERRITIN 108.0 06/26/2014     Irritable bowel syndrome Continue dicyclomine  Internal hemorrhoids with prolapse and bleeding RP banded  today - adjusted after waiting 5 minutes RTC 2-4 weeks for mor banding    I appreciate the opportunity to care for this patient. FV:CBSWHQ, LAYNE C, NP

## 2014-06-26 NOTE — Patient Instructions (Addendum)
Your physician has requested that you go to the basement for lab work before leaving today. Continue Prunes daily. Number 2 banding is scheduled for 07/19/14 230 pm. HEMORRHOID BANDING PROCEDURE    FOLLOW-UP CARE   1. The procedure you have had should have been relatively painless since the banding of the area involved does not have nerve endings and there is no pain sensation.  The rubber band cuts off the blood supply to the hemorrhoid and the band may fall off as soon as 48 hours after the banding (the band may occasionally be seen in the toilet bowl following a bowel movement). You may notice a temporary feeling of fullness in the rectum which should respond adequately to plain Tylenol or Motrin.  2. Following the banding, avoid strenuous exercise that evening and resume full activity the next day.  A sitz bath (soaking in a warm tub) or bidet is soothing, and can be useful for cleansing the area after bowel movements.     3. To avoid constipation, take two tablespoons of natural wheat bran, natural oat bran, flax, Benefiber or any over the counter fiber supplement and increase your water intake to 7-8 glasses daily.    4. Unless you have been prescribed anorectal medication, do not put anything inside your rectum for two weeks: No suppositories, enemas, fingers, etc.  5. Occasionally, you may have more bleeding than usual after the banding procedure.  This is often from the untreated hemorrhoids rather than the treated one.  Don't be concerned if there is a tablespoon or so of blood.  If there is more blood than this, lie flat with your bottom higher than your head and apply an ice pack to the area. If the bleeding does not stop within a half an hour or if you feel faint, call our office at [phone] or go to the emergency room.  6. Problems are not common; however, if there is a substantial amount of bleeding, severe pain, chills, fever or difficulty passing urine (very rare) or  other problems, you should call us at [phone] or report to the nearest emergency room.  7. Do not stay seated continuously for more than 2-3 hours for a day or two after the procedure.  Tighten your buttock muscles 10-15 times every two hours and take 10-15 deep breaths every 1-2 hours.  Do not spend more than a few minutes on the toilet if you cannot empty your bowel; instead re-visit the toilet at a later time.    CC:  Nicky Pugh MD

## 2014-06-27 NOTE — Telephone Encounter (Signed)
Patient has been scheduled

## 2014-06-28 ENCOUNTER — Telehealth: Payer: Self-pay | Admitting: Hematology & Oncology

## 2014-06-28 NOTE — Telephone Encounter (Signed)
[name] [name]: [id number] Requesting Provider: Dr. Shawnie Dapper Treating Provider: Dr. Burney Gauze Visits: 6 Status: Approved Dates: 06/27/2014 - 12/24/2014       COPY SCANNED

## 2014-06-28 NOTE — Assessment & Plan Note (Signed)
RP banded today - adjusted after waiting 5 minutes RTC 2-4 weeks for mor banding

## 2014-07-04 ENCOUNTER — Encounter: Payer: Self-pay | Admitting: Nurse Practitioner

## 2014-07-04 ENCOUNTER — Other Ambulatory Visit: Payer: Self-pay | Admitting: Obstetrics and Gynecology

## 2014-07-04 DIAGNOSIS — R921 Mammographic calcification found on diagnostic imaging of breast: Secondary | ICD-10-CM

## 2014-07-05 ENCOUNTER — Telehealth: Payer: Self-pay

## 2014-07-05 NOTE — Telephone Encounter (Signed)
pls call pt: Advise No. Per Dr Celesta Aver notes, she had colonoscopy in 2013 at an [place] office.  I was concerned about hemoglobin drop, but it has come up & appears stable.  I agree with Dr Celesta Aver assessment.

## 2014-07-05 NOTE — Telephone Encounter (Signed)
Spoke with pt, her last colonoscopy was in 2007 with Dr Carlean Purl. She states Dr Carlean Purl said she is not due for her next colonoscopy until 2017. She wants to ask you is there any indication to get one sooner? Please advise.

## 2014-07-05 NOTE — Telephone Encounter (Signed)
LMOVM for pt to return call 

## 2014-07-05 NOTE — Telephone Encounter (Signed)
Patient returned call and was advised. 

## 2014-07-08 ENCOUNTER — Encounter: Payer: Self-pay | Admitting: Family

## 2014-07-08 ENCOUNTER — Other Ambulatory Visit (HOSPITAL_BASED_OUTPATIENT_CLINIC_OR_DEPARTMENT_OTHER): Payer: Commercial Managed Care - HMO | Admitting: Lab

## 2014-07-08 ENCOUNTER — Telehealth: Payer: Self-pay | Admitting: Hematology & Oncology

## 2014-07-08 ENCOUNTER — Ambulatory Visit (HOSPITAL_BASED_OUTPATIENT_CLINIC_OR_DEPARTMENT_OTHER): Payer: Commercial Managed Care - HMO | Admitting: Family

## 2014-07-08 ENCOUNTER — Ambulatory Visit: Payer: Commercial Managed Care - HMO

## 2014-07-08 ENCOUNTER — Ambulatory Visit (HOSPITAL_BASED_OUTPATIENT_CLINIC_OR_DEPARTMENT_OTHER): Payer: Commercial Managed Care - HMO

## 2014-07-08 VITALS — BP 130/68 | HR 56 | Temp 98.1°F | Resp 14 | Ht 65.0 in | Wt 184.0 lb

## 2014-07-08 DIAGNOSIS — IMO0001 Reserved for inherently not codable concepts without codable children: Secondary | ICD-10-CM

## 2014-07-08 DIAGNOSIS — D638 Anemia in other chronic diseases classified elsewhere: Secondary | ICD-10-CM

## 2014-07-08 DIAGNOSIS — Z801 Family history of malignant neoplasm of trachea, bronchus and lung: Secondary | ICD-10-CM

## 2014-07-08 DIAGNOSIS — D649 Anemia, unspecified: Secondary | ICD-10-CM

## 2014-07-08 DIAGNOSIS — D6489 Other specified anemias: Secondary | ICD-10-CM

## 2014-07-08 DIAGNOSIS — Z23 Encounter for immunization: Secondary | ICD-10-CM

## 2014-07-08 LAB — CBC WITH DIFFERENTIAL (CANCER CENTER ONLY)
BASO#: 0 10*3/uL (ref 0.0–0.2)
BASO%: 0.3 % (ref 0.0–2.0)
EOS%: 3 % (ref 0.0–7.0)
Eosinophils Absolute: 0.2 10*3/uL (ref 0.0–0.5)
HEMATOCRIT: 35.3 % (ref 34.8–46.6)
HGB: 11.4 g/dL — ABNORMAL LOW (ref 11.6–15.9)
LYMPH#: 2.5 10*3/uL (ref 0.9–3.3)
LYMPH%: 32.7 % (ref 14.0–48.0)
MCH: 29.8 pg (ref 26.0–34.0)
MCHC: 32.3 g/dL (ref 32.0–36.0)
MCV: 92 fL (ref 81–101)
MONO#: 0.6 10*3/uL (ref 0.1–0.9)
MONO%: 7.7 % (ref 0.0–13.0)
NEUT#: 4.3 10*3/uL (ref 1.5–6.5)
NEUT%: 56.3 % (ref 39.6–80.0)
PLATELETS: 250 10*3/uL (ref 145–400)
RBC: 3.82 10*6/uL (ref 3.70–5.32)
RDW: 13.3 % (ref 11.1–15.7)
WBC: 7.6 10*3/uL (ref 3.9–10.0)

## 2014-07-08 LAB — CHCC SATELLITE - SMEAR

## 2014-07-08 MED ORDER — INFLUENZA VAC SPLIT QUAD 0.5 ML IM SUSY
0.5000 mL | PREFILLED_SYRINGE | Freq: Once | INTRAMUSCULAR | Status: AC
  Administered 2014-07-08: 0.5 mL via INTRAMUSCULAR
  Filled 2014-07-08: qty 0.5

## 2014-07-08 NOTE — Telephone Encounter (Signed)
I spoke w NEW PATIENT today to remind them of their appointment with Dr. Ennever. Also, advised them to bring all medication bottles and insurance card information. ° °

## 2014-07-09 LAB — FERRITIN CHCC: Ferritin: 105 ng/ml (ref 9–269)

## 2014-07-09 LAB — IRON AND TIBC CHCC
%SAT: 31 % (ref 21–57)
Iron: 88 ug/dL (ref 41–142)
TIBC: 281 ug/dL (ref 236–444)
UIBC: 193 ug/dL (ref 120–384)

## 2014-07-09 NOTE — Progress Notes (Signed)
Hematology/Oncology Consultation   Name: [name] [name]      MRN: [id number]    Location: Room/bed info not found  Date: 07/09/2014 Time:9:07 AM   REFERRING PHYSICIAN:  Layne C Weaver  REASON FOR CONSULT:  Abnormal peripheral smear and beta thalassemia anemia   DIAGNOSIS:  Mild anemia  HISTORY OF PRESENT ILLNESS:  [name] [name] is a very pleasant 68 yo female with a recent abnormal blood smear during a visit with her PCP. She was found to have elliptocyte, polychromasia and reactive lymphocytes. 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  Her niece also has beta thalassemia anemia. Her mother was anemic and passed away in 2008/01/13 from myelodysplasia. Her sister and brother are anemic. Her grandfather had leukemia. Her dad and uncle were smokers and passed away from lung cancer. She was a smoker but quit 5 years ago. Her uncle had renal cell carcinoma and another uncle had a cancerous brain tumor. She is not a vegetarian. She has no issues with her thyroid that she is aware of. She has no personal or familial history of clotting disorders. She is schedule for her mammogram and female exam this Friday. All other mammograms have been negative. Her last colonoscopy was in 2006/01/12 and was negative. She had a hysterectomy a few years ago. 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  She has no complaints. She is feeling ok. She denies fever, chills, n/v, cough, rash, headache, dizziness, SOB, chest pain, palpitations, abdominal pain, constipation, diarrhea, blood in urine or stool. She has had no swelling, tenderness, numbness or tingling in her extremities. No bleeding or pain. Her appetite is good and she is well hydrated. Overall, she seems to be doing quite well.     ROS: All other 10 point review of systems is negative.   PAST MEDICAL HISTORY:   Past Medical History  Diagnosis Date  . Unspecified essential hypertension   . Unspecified venous  (peripheral) insufficiency   . Irritable bowel syndrome   . Unspecified vitamin D deficiency   . Pernicious anemia   . History of gastritis     01-12-2006  . History of duodenal ulcer     01-12-06  . Fibromyalgia   . Anxiety   . Hyperlipidemia   . Chronic cystitis   . Internal and external hemorrhoids without complication   . GERD (gastroesophageal reflux disease)   . PONV (postoperative nausea and vomiting)   . Intrinsic (urethral) sphincter deficiency (ISD)   . Urine incontinence    ALLERGIES: Allergies  Allergen Reactions  . Morphine Nausea And Vomiting    SEVERE  . Sulfa Antibiotics Swelling   MEDICATIONS:  Current Outpatient Prescriptions on File Prior to Visit  Medication Sig Dispense Refill  . ALPRAZolam (XANAX) 0.5 MG tablet 1/2 to 1 tablet three times a day as needed  270 tablet  0  . aspirin 81 MG tablet Take 81 mg by mouth daily.      Marland Kitchen atenolol (TENORMIN) 25 MG tablet Take 1 tablet (25 mg total) by mouth every morning.  90 tablet  1  . calcium carbonate (OS-CAL) 600 MG TABS Take 600 mg by mouth daily.      . cholecalciferol (VITAMIN D) 1000 UNITS tablet Take 1,000 Units by mouth daily.       . Cyanocobalamin (VITAMIN B-12) 1000 MCG SUBL Place under the tongue daily.      . diclofenac sodium (VOLTAREN) 1 %  GEL Apply dime sized amount to finger 2-3 times daily.  100 g  1  . dicyclomine (BENTYL) 20 MG tablet Take 20 mg by mouth as needed.      Marland Kitchen lisinopril-hydrochlorothiazide (PRINZIDE,ZESTORETIC) 10-12.5 MG per tablet Take 1 tablet by mouth daily.  90 tablet  1  . meloxicam (MOBIC) 7.5 MG tablet Take 1 tablet (7.5 mg total) by mouth daily. As needed for arthritis inflammation  90 tablet  3  . Multiple Vitamins-Minerals (WOMENS MULTIVITAMIN PLUS PO) Take 1 tablet by mouth daily.      Marland Kitchen omeprazole (PRILOSEC) 20 MG capsule Take 1 capsule (20 mg total) by mouth every morning. Take 30 minutes before a meal  90 capsule  1  . pravastatin (PRAVACHOL) 40 MG tablet TAKE 1 TABLET (40 MG  TOTAL) BY MOUTH DAILY.  90 tablet  1  . sertraline (ZOLOFT) 100 MG tablet Take 1 tablet (100 mg total) by mouth daily.  90 tablet  1  . traMADol (ULTRAM) 50 MG tablet Take 1 tablet (50 mg total) by mouth every 6 (six) hours as needed.  90 tablet  1   No current facility-administered medications on file prior to visit.    PAST SURGICAL HISTORY Past Surgical History  Procedure Laterality Date  . Tubal ligation    . Total abdominal hysterectomy  1986  . Anterior cervical decomp/discectomy fusion  1999  . Bilateral salpingoophorectomy  1984  . Dilation and curettage of uterus    . Appendectomy  1984  . Excision right neck and left breast sebaceous cyst  05-18-2002  . Anterior and posterior vaginal repair  04-29-2004    AND TRANSVAGINAL TAPE SLING  . Cardiac catheterization  10-03-2002   DR DEGENT    NORMAL CORONARIES ARTERIES/  EF 55%  . Vaginal prolapse repair N/A 08/27/2013    Procedure: ANTERIOR VAGINAL VAULT SUSPENSION, KELLY PLICATION WITH SACROSPINOUS LIGAMENT FIXATION AND XENFORM BOVINE DERMIS GRAFT AUGMENTATION, URETHRAL EXPLORATION, URETHROLYSIS, EXPLANTATION OF TVT TAPE, IMPLANTATION OF FLOSEAL, IMPLANTATION OF XENFORM BOVINE GRAFT IN PERIURETHRAL SPACE;  Surgeon: Ailene Rud, MD;  Location: [place];  Service: Urology;  Laterality: N/A  . Cystoscopy N/A 08/27/2013    Procedure: CYSTOSCOPY;  Surgeon: Ailene Rud, MD;  Location: [facility];  Service: Urology;  Laterality: N/A;  . Cystoscopy with injection N/A 12/17/2013    Procedure: MACROPLASQTIQUE WITH INJECTION;  Surgeon: Ailene Rud, MD;  Location: [facility];  Service: Urology;  Laterality: N/A;   FAMILY HISTORY: Family History  Problem Relation Age of Onset  . Lung cancer Father   . Coronary artery disease Father   . Hypertension Mother   . Heart disease Mother   . Myelodysplastic syndrome Mother   . Fibromyalgia Sister   . Hypertension Brother    . Anemia Brother   . Colon polyps      aunt  . Anemia Maternal Aunt    SOCIAL HISTORY:  reports that she quit smoking about 4 years ago. Her smoking use included Cigarettes. She started smoking about 20 years ago. She has a 15 pack-year smoking history. She has never used smokeless tobacco. She reports that she drinks alcohol. She reports that she does not use illicit drugs.  PERFORMANCE STATUS: The patient's performance status is 0 - Asymptomatic  PHYSICAL EXAM: Most Recent Vital Signs: Blood pressure 130/68, pulse 56, temperature 98.1 F (36.7 C), temperature source Oral, resp. rate 14, height 5\' 5"  (1.651 m), weight 184 lb (83.462 kg).  BP 130/68  Pulse 56  Temp(Src) 98.1 F (36.7 C) (Oral)  Resp 14  Ht 5\' 5"  (1.651 m)  Wt 184 lb (83.462 kg)  BMI 30.62 kg/m2  General Appearance:    Alert, cooperative, no distress, appears stated age  Head:    Normocephalic, without obvious abnormality, atraumatic  Eyes:    PERRL, conjunctiva/corneas clear, EOM's intact, fundi    benign, both eyes        Throat:   Lips, mucosa, and tongue normal; teeth and gums normal  Neck:   Supple, symmetrical, trachea midline, no adenopathy;    thyroid:  no enlargement/tenderness/nodules; no carotid   bruit or JVD  Back:     Symmetric, no curvature, ROM normal, no CVA tenderness  Lungs:     Clear to auscultation bilaterally, respirations unlabored  Chest Wall:    No tenderness or deformity   Heart:    Regular rate and rhythm, S1 and S2 normal, no murmur, rub   or gallop     Abdomen:     Soft, non-tender, bowel sounds active all four quadrants,    no masses, no organomegaly        Extremities:   Extremities normal, atraumatic, no cyanosis or edema  Pulses:   2+ and symmetric all extremities  Skin:   Skin color, texture, turgor normal, no rashes or lesions  Lymph nodes:   Cervical, supraclavicular, and axillary nodes normal  Neurologic:   CNII-XII intact, normal strength, sensation and reflexes     throughout    LABORATORY DATA:  Results for orders placed in visit on 07/08/14 (from the past 48 hour(s))  CBC WITH DIFFERENTIAL ([facility])     Status: Abnormal   Collection Time    07/08/14  2:41 PM      Result Value Ref Range   WBC 7.6  3.9 - 10.0 10e3/uL   RBC 3.82  3.70 - 5.32 10e6/uL   HGB 11.4 (*) 11.6 - 15.9 g/dL   HCT 35.3  34.8 - 46.6 %   MCV 92  81 - 101 fL   MCH 29.8  26.0 - 34.0 pg   MCHC 32.3  32.0 - 36.0 g/dL   RDW 13.3  11.1 - 15.7 %   Platelets 250  145 - 400 10e3/uL   NEUT# 4.3  1.5 - 6.5 10e3/uL   LYMPH# 2.5  0.9 - 3.3 10e3/uL   MONO# 0.6  0.1 - 0.9 10e3/uL   Eosinophils Absolute 0.2  0.0 - 0.5 10e3/uL   BASO# 0.0  0.0 - 0.2 10e3/uL   NEUT% 56.3  39.6 - 80.0 %   LYMPH% 32.7  14.0 - 48.0 %   MONO% 7.7  0.0 - 13.0 %   EOS% 3.0  0.0 - 7.0 %   BASO% 0.3  0.0 - 2.0 %  [facility] - SMEAR     Status: None   Collection Time    07/08/14  2:41 PM      Result Value Ref Range   Smear Result Smear Available    RETICULOCYTE COUNT ([facility])     Status: Abnormal (Preliminary result)   Collection Time    07/08/14  2:41 PM      Result Value Ref Range   Retic Ct Pct 3.5 (*) 0.4 - 2.3 %   RBC. 3.91  3.87 - 5.11 MIL/uL   ABS Retic 136.9  19.0 - 186.0 K/uL     RADIOGRAPHY: No results found.     PATHOLOGY:  None  ASSESSMENT/PLAN: Ms. Rhein is a very pleasant 68 yo female with a recent abnormal blood smear during a visit with her PCP. She is completely asymptomatic at this time.  Her Hgb today is 11.4. Her peripheral smear today did not show any elliptocytes, polychromasia or reactive lymphocytes.  We will check her TSH and wait to see what the rest of her labs show before making a follow-up appointment. All questions were answered. She knows to call the clinic with any problems, questions or concerns. We can certainly see her much sooner if necessary. The patient was discussed with and also seen by Dr. Marin Olp and he is in agreement with the aforementioned.    [place] by Dr. Marin Olp:  I saw and examined the patient with Aydeen Blume. We looked at her blood smear. Her blood smear was pretty much unremarkable. I do not see target cells. I really cannot believe that she has beta thalassemia. She's not in gain nor she of Mediterranean descent. I've no idea of where this came from.  Her iron studies looked okay. I don't think she is iron deficient.  Her erythropoietin level is on the low side. This might explain some of the anemia. She does have a decent reticulocyte count.  Shows a normal hemoglobin electrophoresis, which again rules out beta thalassemia.  I just don't see that there is any blood problem we have to deal with. Her exam is unremarkable. There is no splenomegaly. There is no lymphadenopathy.  I don't believe we have to see her back in the office. We spent a good 45 minutes with her. We showed her the lab work. She is quite happy that she did not have to come back.

## 2014-07-12 ENCOUNTER — Ambulatory Visit
Admission: RE | Admit: 2014-07-12 | Discharge: 2014-07-12 | Disposition: A | Discharge status: Home / Self Care | Payer: Commercial Managed Care - HMO | Source: Ambulatory Visit | Attending: Obstetrics and Gynecology | Admitting: Obstetrics and Gynecology

## 2014-07-12 DIAGNOSIS — R921 Mammographic calcification found on diagnostic imaging of breast: Secondary | ICD-10-CM

## 2014-07-16 ENCOUNTER — Encounter: Payer: Self-pay | Admitting: Pulmonary Disease

## 2014-07-19 ENCOUNTER — Encounter: Payer: Commercial Managed Care - HMO | Admitting: Internal Medicine

## 2014-07-19 LAB — HEMOGLOBINOPATHY EVALUATION
HGB A2 QUANT: 2.8 % (ref 2.2–3.2)
HGB A: 97.2 % (ref 96.8–97.8)
HGB F QUANT: 0 % (ref 0.0–2.0)
HGB S QUANTITAION: 0 %
Hemoglobin Other: 0 %

## 2014-07-19 LAB — ERYTHROPOIETIN: ERYTHROPOIETIN: 12.4 m[IU]/mL (ref 2.6–18.5)

## 2014-07-19 LAB — RETICULOCYTES (CHCC)
ABS Retic: 136.9 10*3/uL (ref 19.0–186.0)
RBC.: 3.91 MIL/uL (ref 3.87–5.11)
Retic Ct Pct: 3.5 % — ABNORMAL HIGH (ref 0.4–2.3)

## 2014-07-19 LAB — ALPHA-THALASSEMIA GENOTYPR

## 2014-07-30 ENCOUNTER — Telehealth: Payer: Self-pay | Admitting: *Deleted

## 2014-07-30 ENCOUNTER — Encounter: Payer: Self-pay | Admitting: Nurse Practitioner

## 2014-07-30 ENCOUNTER — Ambulatory Visit (INDEPENDENT_AMBULATORY_CARE_PROVIDER_SITE_OTHER): Payer: Commercial Managed Care - HMO | Admitting: Nurse Practitioner

## 2014-07-30 VITALS — BP 150/76 | HR 58 | Temp 97.5°F | Ht 66.0 in | Wt 188.0 lb

## 2014-07-30 DIAGNOSIS — E781 Pure hyperglyceridemia: Secondary | ICD-10-CM

## 2014-07-30 DIAGNOSIS — E559 Vitamin D deficiency, unspecified: Secondary | ICD-10-CM

## 2014-07-30 DIAGNOSIS — M25561 Pain in right knee: Secondary | ICD-10-CM

## 2014-07-30 DIAGNOSIS — M25562 Pain in left knee: Secondary | ICD-10-CM

## 2014-07-30 DIAGNOSIS — I10 Essential (primary) hypertension: Secondary | ICD-10-CM

## 2014-07-30 DIAGNOSIS — D649 Anemia, unspecified: Secondary | ICD-10-CM

## 2014-07-30 NOTE — Telephone Encounter (Signed)
error 

## 2014-07-30 NOTE — Patient Instructions (Signed)
Cut out refined sugar: anything that is sweet when you eat or drink it, except fresh fruit. Cut out white bread, rolls, biscuits, bagels, muffins, pasta and cereals. Breads & cereals that have 4 gm or more of fiber per serving are good. Whole wheat pasta, brown rice and quinoa are good. Get no sugar added peanut butter.  Use aspercream on knees three times daily.  Use neilmed sinus rinse daily for allergy management.  Return for labs.  Nice to see you!  See you in 6 months.

## 2014-07-30 NOTE — Progress Notes (Signed)
Pre visit review using our clinic review tool, if applicable. No additional management support is needed unless otherwise documented below in the visit note. 

## 2014-07-31 DIAGNOSIS — D649 Anemia, unspecified: Secondary | ICD-10-CM | POA: Insufficient documentation

## 2014-07-31 DIAGNOSIS — M25562 Pain in left knee: Secondary | ICD-10-CM

## 2014-07-31 DIAGNOSIS — M25561 Pain in right knee: Secondary | ICD-10-CM | POA: Insufficient documentation

## 2014-07-31 DIAGNOSIS — E781 Pure hyperglyceridemia: Secondary | ICD-10-CM | POA: Insufficient documentation

## 2014-07-31 HISTORY — DX: Pure hyperglyceridemia: E78.1

## 2014-07-31 NOTE — Assessment & Plan Note (Signed)
Continue daily supplement 

## 2014-07-31 NOTE — Assessment & Plan Note (Signed)
Cut out sweet foods & drinks, except fresh fruit. All grains (cereals, breads, muffins, bagels, rice, pasta) should have 4 gm or more fiber per serving.

## 2014-07-31 NOTE — Progress Notes (Signed)
Subjective:     [name] [name] is a 68 y.o. female presents for f/u of HTN, chronic mild anemia, hypertriglyceridemia, Vitamin D deficiency, & new c/o bilat knee pain. BP is failrly well controlled. Goal is <150/90. She is tolerating meds w/out SE. RF: sedentary, age, over weight.  She continues to take Vit D supplement for deficicency. Goal is 50-80. Re mild chronic anemia, she is not iron deficient. She had w/u w/Dr Marin Olp due to periph smear showing reactive lymphocytes & poikilocytosis, fam Hx myelodysplasia & anemia, which pt thought was beta thalassemia as she has [place] ancestry. Knee pain has been present for several mos & is worse w/wt bearing. Tylenol eases pain. Pt describes as mild to moderate.  Re hypertriglyceridemia: I counseled pt to decrease refined sugar & grains. Specific instructions given. She is still seeing urology for incontinence. She is having manual therapy & avoiding all artificial sweeteners, caffeine, sodas, & tea.  The following portions of the patient's history were reviewed and updated as appropriate: allergies, current medications, past medical history, past social history, past surgical history and problem list.  Review of Systems Pertinent items are noted in HPI.    Objective:    BP 150/76  Pulse 58  Temp(Src) 97.5 F (36.4 C) (Temporal)  Ht 5\' 6"  (1.676 m)  Wt 188 lb (85.276 kg)  BMI 30.36 kg/m2  SpO2 98% BP 150/76  Pulse 58  Temp(Src) 97.5 F (36.4 C) (Temporal)  Ht 5\' 6"  (1.676 m)  Wt 188 lb (85.276 kg)  BMI 30.36 kg/m2  SpO2 98% General appearance: alert, cooperative, appears stated age and no distress Head: Normocephalic, without obvious abnormality, atraumatic Eyes: negative findings: lids and lashes normal and conjunctivae and sclerae normal Lungs: clear to auscultation bilaterally Heart: regular rate and rhythm, S1, S2 normal, no murmur, click, rub or gallop Extremities: varicose veins noted Pulses: 2+ and symmetric Lymph nodes: no  cervical or [place] LAD    Assessment:   1. Hypertriglyceridemia - Lipid panel; Future  2. Vitamin D deficiency - Vit D  25 hydroxy (rtn osteoporosis monitoring); Future  3. Mild chronic anemia  4. Knee pain, bilateral  5. Essential hypertension  See problem list for complete A&P See pt instructions. F/u 6 mos

## 2014-07-31 NOTE — Assessment & Plan Note (Signed)
Varicose veins noted behind knees DD: oa, venous insufficiency. aspercream tid

## 2014-07-31 NOTE — Assessment & Plan Note (Signed)
Fair control.  Risk factors: age, sedentary, over weight Continue meds, increase exercise, diet modification.

## 2014-08-09 ENCOUNTER — Other Ambulatory Visit (INDEPENDENT_AMBULATORY_CARE_PROVIDER_SITE_OTHER): Payer: Commercial Managed Care - HMO

## 2014-08-09 DIAGNOSIS — E559 Vitamin D deficiency, unspecified: Secondary | ICD-10-CM

## 2014-08-09 DIAGNOSIS — E781 Pure hyperglyceridemia: Secondary | ICD-10-CM

## 2014-08-09 LAB — LIPID PANEL
CHOL/HDL RATIO: 4
Cholesterol: 166 mg/dL (ref 0–200)
HDL: 39.4 mg/dL (ref >39.00)
LDL CALC: 102 mg/dL — AB (ref 0–99)
NONHDL: 126.6
Triglycerides: 125 mg/dL (ref 0.0–149.0)
VLDL: 25 mg/dL (ref 0.0–40.0)

## 2014-08-12 ENCOUNTER — Telehealth: Payer: Self-pay | Admitting: Nurse Practitioner

## 2014-08-12 LAB — VITAMIN D 25 HYDROXY (VIT D DEFICIENCY, FRACTURES): VITD: 35.78 ng/mL (ref 30.00–100.00)

## 2014-08-12 NOTE — Telephone Encounter (Signed)
Spoke with pt, advised lab results. Pt understood. 

## 2014-08-12 NOTE — Telephone Encounter (Signed)
Left message for pt to call back  °

## 2014-08-12 NOTE — Telephone Encounter (Signed)
pls call pt: Advise Vit D low. Start D3 2000 iu daily with meal. Cholesterol looks good! Triglycerides have come down since cutting back on sugar & refined grains. F/u as scheduled.

## 2014-08-12 NOTE — Telephone Encounter (Signed)
Patient called to get test results. Please call.

## 2014-09-13 ENCOUNTER — Encounter: Payer: Self-pay | Admitting: Internal Medicine

## 2014-09-13 ENCOUNTER — Ambulatory Visit (INDEPENDENT_AMBULATORY_CARE_PROVIDER_SITE_OTHER): Payer: Commercial Managed Care - HMO | Admitting: Internal Medicine

## 2014-09-13 VITALS — BP 130/74 | HR 60 | Ht 65.5 in | Wt 184.1 lb

## 2014-09-13 DIAGNOSIS — M5431 Sciatica, right side: Secondary | ICD-10-CM

## 2014-09-13 DIAGNOSIS — K648 Other hemorrhoids: Secondary | ICD-10-CM

## 2014-09-13 DIAGNOSIS — M5430 Sciatica, unspecified side: Secondary | ICD-10-CM | POA: Insufficient documentation

## 2014-09-13 NOTE — Patient Instructions (Addendum)
HEMORRHOID BANDING PROCEDURE    FOLLOW-UP CARE   1. The procedure you have had should have been relatively painless since the banding of the area involved does not have nerve endings and there is no pain sensation.  The rubber band cuts off the blood supply to the hemorrhoid and the band may fall off as soon as 48 hours after the banding (the band may occasionally be seen in the toilet bowl following a bowel movement). You may notice a temporary feeling of fullness in the rectum which should respond adequately to plain Tylenol or Motrin.  2. Following the banding, avoid strenuous exercise that evening and resume full activity the next day.  A sitz bath (soaking in a warm tub) or bidet is soothing, and can be useful for cleansing the area after bowel movements.     3. To avoid constipation, take two tablespoons of natural wheat bran, natural oat bran, flax, Benefiber or any over the counter fiber supplement and increase your water intake to 7-8 glasses daily.    4. Unless you have been prescribed anorectal medication, do not put anything inside your rectum for two weeks: No suppositories, enemas, fingers, etc.  5. Occasionally, you may have more bleeding than usual after the banding procedure.  This is often from the untreated hemorrhoids rather than the treated one.  Don't be concerned if there is a tablespoon or so of blood.  If there is more blood than this, lie flat with your bottom higher than your head and apply an ice pack to the area. If the bleeding does not stop within a half an hour or if you feel faint, call our office at [phone] or go to the emergency room.  6. Problems are not common; however, if there is a substantial amount of bleeding, severe pain, chills, fever or difficulty passing urine (very rare) or other problems, you should call us at [phone] or report to the nearest emergency room.  7. Do not stay seated continuously for more than 2-3 hours for a day or two  after the procedure.  Tighten your buttock muscles 10-15 times every two hours and take 10-15 deep breaths every 1-2 hours.  Do not spend more than a few minutes on the toilet if you cannot empty your bowel; instead re-visit the toilet at a later time.    Follow up with Dr. Carlean Purl as needed.  Today you have been given information on sciatica to read and follow. If you continue to have back issues please go see your PCP.  I appreciate the opportunity to care for you.

## 2014-09-13 NOTE — Progress Notes (Signed)
Patient ID: [name] [name], female   DOB: 03/09/1946, 68 y.o.   MRN: [id number]          PROCEDURE NOTE: The patient presents with symptomatic grade 2  hemorrhoids, requesting rubber band ligation of his/her hemorrhoidal disease.  All risks, benefits and alternative forms of therapy were described and informed consent was obtained.  She reports some improvement in bleeding since initial banding 06/2014. Has seen hematology who did not think she was iron deficient. Last colonoscopy was 2007.  Patti [place], [place] present.   The anorectum was pre-medicated with 0.125% NTG and 5% lidocaine The decision was made to band the RA and LL internal hemorrhoids, and the [place] was used to perform band ligation without complication.  Digital anorectal examination was then performed to assure proper positioning of the band, and to adjust the banded tissue as required.  The patient was discharged home without pain or other issues.  Dietary and behavioral recommendations were given and along with follow-up instructions.     The following adjunctive treatments were recommended:  Continue prunes and MiraLax for constipation.  The patient will return as needed for  follow-up and possible additional banding as required. No complications were encountered and the patient tolerated the procedure well.     Hemorrhoids, internal, with bleeding RA and LL banded  Sciatica Having sxs c/w right siciatica today with some RLQ pain also Handouts given and advised to f/u PCP if this does not resolve no weakness noted, sxs are right low back, buttock and hamstring pain wih some RLQ pain. Abd exam is benign.

## 2014-09-13 NOTE — Assessment & Plan Note (Signed)
Having sxs c/w right siciatica today with some RLQ pain also Handouts given and advised to f/u PCP if this does not resolve no weakness noted, sxs are right low back, buttock and hamstring pain wih some RLQ pain. Abd exam is benign.

## 2014-09-13 NOTE — Assessment & Plan Note (Signed)
RA and LL banded 

## 2014-09-16 ENCOUNTER — Other Ambulatory Visit: Payer: Self-pay | Admitting: *Deleted

## 2014-09-16 ENCOUNTER — Other Ambulatory Visit: Payer: Self-pay | Admitting: Nurse Practitioner

## 2014-09-16 DIAGNOSIS — F329 Major depressive disorder, single episode, unspecified: Secondary | ICD-10-CM

## 2014-09-16 DIAGNOSIS — F32A Depression, unspecified: Secondary | ICD-10-CM

## 2014-09-16 DIAGNOSIS — Z79899 Other long term (current) drug therapy: Secondary | ICD-10-CM

## 2014-09-16 MED ORDER — PRAVASTATIN SODIUM 40 MG PO TABS: ORAL_TABLET | ORAL | Status: DC

## 2014-09-16 MED ORDER — ALPRAZOLAM 0.5 MG PO TABS: ORAL_TABLET | ORAL | Status: DC

## 2014-09-16 MED ORDER — SERTRALINE HCL 100 MG PO TABS: 100.0000 mg | ORAL_TABLET | Freq: Every day | ORAL | Status: DC

## 2014-09-16 MED ORDER — MELOXICAM 7.5 MG PO TABS: 7.5000 mg | ORAL_TABLET | Freq: Every day | ORAL | Status: DC

## 2014-09-16 MED ORDER — LISINOPRIL-HYDROCHLOROTHIAZIDE 10-12.5 MG PO TABS: 1.0000 | ORAL_TABLET | Freq: Every day | ORAL | Status: DC

## 2014-09-16 MED ORDER — ATENOLOL 25 MG PO TABS: 25.0000 mg | ORAL_TABLET | Freq: Every morning | ORAL | Status: DC

## 2014-09-16 MED ORDER — OMEPRAZOLE 20 MG PO CPDR: 20.0000 mg | DELAYED_RELEASE_CAPSULE | Freq: Every morning | ORAL | Status: DC

## 2014-09-16 NOTE — Telephone Encounter (Signed)
pls have pt come in for cmet at her convenience, no need to fast

## 2014-09-16 NOTE — Telephone Encounter (Signed)
Refill request for sertraline Last filled by MD on- 04/16/14 #90 x1 Last Appt: 07/30/2014 Next Appt: 01/29/2015 Please advise refill?

## 2014-09-18 NOTE — Telephone Encounter (Signed)
Pt needs BMET!!!! 

## 2014-09-18 NOTE — Telephone Encounter (Signed)
Patient scheduled lab appt.

## 2014-09-20 ENCOUNTER — Other Ambulatory Visit: Payer: Self-pay | Admitting: Nurse Practitioner

## 2014-09-20 ENCOUNTER — Other Ambulatory Visit (INDEPENDENT_AMBULATORY_CARE_PROVIDER_SITE_OTHER): Payer: Commercial Managed Care - HMO

## 2014-09-20 DIAGNOSIS — Z79899 Other long term (current) drug therapy: Secondary | ICD-10-CM

## 2014-09-20 LAB — COMPREHENSIVE METABOLIC PANEL
ALT: 14 U/L (ref 0–35)
AST: 16 U/L (ref 0–37)
Albumin: 4.3 g/dL (ref 3.5–5.2)
Alkaline Phosphatase: 71 U/L (ref 39–117)
BUN: 28 mg/dL — ABNORMAL HIGH (ref 6–23)
CALCIUM: 10 mg/dL (ref 8.4–10.5)
CO2: 27 meq/L (ref 19–32)
Chloride: 101 mEq/L (ref 96–112)
Creatinine, Ser: 1 mg/dL (ref 0.4–1.2)
GFR: 56.09 mL/min — ABNORMAL LOW (ref >60.00)
Glucose, Bld: 96 mg/dL (ref 70–99)
Potassium: 3.8 mEq/L (ref 3.5–5.1)
SODIUM: 138 meq/L (ref 135–145)
TOTAL PROTEIN: 7 g/dL (ref 6.0–8.3)
Total Bilirubin: 0.6 mg/dL (ref 0.2–1.2)

## 2014-09-25 ENCOUNTER — Telehealth: Payer: Self-pay | Admitting: *Deleted

## 2014-09-25 NOTE — Telephone Encounter (Signed)
LMOVM for pt to return call 

## 2014-09-25 NOTE — Telephone Encounter (Signed)
pls call pt: Advise Lab results: kidney function slightly elevated. Take tylenol for pain rather than ibuprophen, mobic, or advil. Check again in 3 mos. Tylenol 1000 mg tid prn. sched OV in 3  Mos.

## 2014-09-25 NOTE — Telephone Encounter (Signed)
Patient left vm requesting results. Please advise?

## 2014-09-26 NOTE — Telephone Encounter (Signed)
Patient notified of results. Patient scheduled f/u appt.

## 2014-10-03 ENCOUNTER — Other Ambulatory Visit: Payer: Self-pay | Admitting: *Deleted

## 2014-10-03 ENCOUNTER — Other Ambulatory Visit: Payer: Self-pay

## 2014-10-03 DIAGNOSIS — M549 Dorsalgia, unspecified: Secondary | ICD-10-CM

## 2014-10-03 MED ORDER — TRAMADOL HCL 50 MG PO TABS: 50.0000 mg | ORAL_TABLET | Freq: Four times a day (QID) | ORAL | Status: DC | PRN

## 2014-10-03 NOTE — Telephone Encounter (Signed)
Refill request for Tramadol Last filled by MD on- 01/28/14 Last Appt: 09/20/2014 Next Appt: 12/10/2014 Please advise refill? Patient would like rx to go to [facility]

## 2014-10-23 DIAGNOSIS — M6281 Muscle weakness (generalized): Secondary | ICD-10-CM | POA: Diagnosis not present

## 2014-10-23 DIAGNOSIS — M62838 Other muscle spasm: Secondary | ICD-10-CM | POA: Diagnosis not present

## 2014-10-23 DIAGNOSIS — N3946 Mixed incontinence: Secondary | ICD-10-CM | POA: Diagnosis not present

## 2014-10-23 DIAGNOSIS — R278 Other lack of coordination: Secondary | ICD-10-CM | POA: Diagnosis not present

## 2014-11-11 DIAGNOSIS — M6281 Muscle weakness (generalized): Secondary | ICD-10-CM | POA: Diagnosis not present

## 2014-11-11 DIAGNOSIS — N3946 Mixed incontinence: Secondary | ICD-10-CM | POA: Diagnosis not present

## 2014-11-11 DIAGNOSIS — M62838 Other muscle spasm: Secondary | ICD-10-CM | POA: Diagnosis not present

## 2014-11-11 DIAGNOSIS — R278 Other lack of coordination: Secondary | ICD-10-CM | POA: Diagnosis not present

## 2014-12-02 DIAGNOSIS — M6281 Muscle weakness (generalized): Secondary | ICD-10-CM | POA: Diagnosis not present

## 2014-12-02 DIAGNOSIS — M62838 Other muscle spasm: Secondary | ICD-10-CM | POA: Diagnosis not present

## 2014-12-02 DIAGNOSIS — N3946 Mixed incontinence: Secondary | ICD-10-CM | POA: Diagnosis not present

## 2014-12-02 DIAGNOSIS — R278 Other lack of coordination: Secondary | ICD-10-CM | POA: Diagnosis not present

## 2014-12-10 ENCOUNTER — Encounter: Payer: Self-pay | Admitting: Nurse Practitioner

## 2014-12-10 ENCOUNTER — Ambulatory Visit (INDEPENDENT_AMBULATORY_CARE_PROVIDER_SITE_OTHER): Payer: Commercial Managed Care - HMO | Admitting: Nurse Practitioner

## 2014-12-10 VITALS — BP 118/75 | HR 58 | Temp 97.1°F | Ht 65.5 in | Wt 187.0 lb

## 2014-12-10 DIAGNOSIS — R799 Abnormal finding of blood chemistry, unspecified: Secondary | ICD-10-CM | POA: Diagnosis not present

## 2014-12-10 DIAGNOSIS — M79643 Pain in unspecified hand: Secondary | ICD-10-CM

## 2014-12-10 DIAGNOSIS — M1990 Unspecified osteoarthritis, unspecified site: Secondary | ICD-10-CM | POA: Diagnosis not present

## 2014-12-10 DIAGNOSIS — M19049 Primary osteoarthritis, unspecified hand: Secondary | ICD-10-CM

## 2014-12-10 MED ORDER — PREDNISONE 5 MG PO TABS: 5.0000 mg | ORAL_TABLET | Freq: Every day | ORAL | Status: DC

## 2014-12-10 NOTE — Patient Instructions (Signed)
Stop meloxicam. Start prednisone. Use heated rice in bowl for hand pain & stiffness. Use voltaren gel up to 3 times daily.  See me in 2 weeks.

## 2014-12-10 NOTE — Progress Notes (Signed)
Subjective:     [name] [name] is a 69 y.o. female presents for f/u of elevated BUN and she c/o pain in hands & feet. BUN: pt was advised to avoid NSAIDS. She did not realize that included meloxicam. She has been taking nearly daily for pain in feet & hands & asked for refill today. Discussed avoidance of all nsaids & named each one. Denies edema, SOB, cough, wheeze, muscle cramps, rapid wt gain. Hands & feet: pain in dip joints & stiff in am. voltaren gel helps hands, tramadol helps feet-"if I don't take tramadol for a few days, I cannot put weight on feet without excruciating pain". She saw rheum in past-over 10 yrs ago. Dx: FM.   The following portions of the patient's history were reviewed and updated as appropriate: allergies, current medications, past medical history, past social history, past surgical history and problem list.  Review of Systems Genitourinary:negative, continues to see PT at urology for external & internal massage    Objective:    BP 118/75 mmHg  Pulse 58  Temp(Src) 97.1 F (36.2 C) (Temporal)  Ht 5' 5.5" (1.664 m)  Wt 187 lb (84.823 kg)  BMI 30.63 kg/m2  SpO2 97% BP 118/75 mmHg  Pulse 58  Temp(Src) 97.1 F (36.2 C) (Temporal)  Ht 5' 5.5" (1.664 m)  Wt 187 lb (84.823 kg)  BMI 30.63 kg/m2  SpO2 97% General appearance: alert, cooperative, appears stated age and no distress Head: Normocephalic, without obvious abnormality, atraumatic Eyes: negative findings: lids and lashes normal and conjunctivae and sclerae normal Lungs: clear to auscultation bilaterally Heart: regular rate and rhythm, S1, S2 normal, no murmur, click, rub or gallop Neurologic: Grossly normal   MSK: Dip joints enlarged & tender both hands   Assessment:Plan     1. Elevated BUN Likely secondary to NSAID use Stop all NSAIDS - Comprehensive metabolic panel - Microalbumin / creatinine urine ratio  2. Arthritis pain, hand DD: psoriatic arthritis, inflammatory OA - predniSONE (DELTASONE)  5 MG tablet; Take 1 tablet (5 mg total) by mouth daily with breakfast.  Dispense: 20 tablet; Refill: 0  F/u 2 weeks-oa response to low dose prednisone

## 2014-12-10 NOTE — Progress Notes (Signed)
Pre visit review using our clinic review tool, if applicable. No additional management support is needed unless otherwise documented below in the visit note. 

## 2014-12-11 ENCOUNTER — Other Ambulatory Visit: Payer: Self-pay | Admitting: *Deleted

## 2014-12-11 DIAGNOSIS — M19049 Primary osteoarthritis, unspecified hand: Secondary | ICD-10-CM

## 2014-12-11 LAB — COMPREHENSIVE METABOLIC PANEL
ALBUMIN: 4.6 g/dL (ref 3.5–5.2)
ALT: 12 U/L (ref 0–35)
AST: 13 U/L (ref 0–37)
Alkaline Phosphatase: 71 U/L (ref 39–117)
BUN: 33 mg/dL — ABNORMAL HIGH (ref 6–23)
CO2: 35 meq/L — AB (ref 19–32)
Calcium: 10.4 mg/dL (ref 8.4–10.5)
Chloride: 104 mEq/L (ref 96–112)
Creatinine, Ser: 1.03 mg/dL (ref 0.40–1.20)
GFR: 56.68 mL/min — AB (ref >60.00)
Glucose, Bld: 95 mg/dL (ref 70–99)
Potassium: 4.6 mEq/L (ref 3.5–5.1)
Sodium: 140 mEq/L (ref 135–145)
Total Bilirubin: 0.4 mg/dL (ref 0.2–1.2)
Total Protein: 7.2 g/dL (ref 6.0–8.3)

## 2014-12-11 LAB — MICROALBUMIN / CREATININE URINE RATIO
CREATININE, U: 139.8 mg/dL
MICROALB/CREAT RATIO: 0.6 mg/g (ref 0.0–30.0)
Microalb, Ur: 0.9 mg/dL (ref 0.0–1.9)

## 2014-12-11 MED ORDER — PREDNISONE 5 MG PO TABS: 5.0000 mg | ORAL_TABLET | Freq: Every day | ORAL | Status: DC

## 2014-12-12 ENCOUNTER — Telehealth: Payer: Self-pay | Admitting: Nurse Practitioner

## 2014-12-12 DIAGNOSIS — R799 Abnormal finding of blood chemistry, unspecified: Secondary | ICD-10-CM

## 2014-12-12 NOTE — Telephone Encounter (Signed)
Called and informed patient, lab visit scheduled.

## 2014-12-12 NOTE — Telephone Encounter (Signed)
pls call pt: Advise Kidney function still elevated. Remind her to stop meloxicam, advil, ibuprophen, aleve. Schedule lab appt in 4 weeks to check again.

## 2014-12-24 ENCOUNTER — Ambulatory Visit (INDEPENDENT_AMBULATORY_CARE_PROVIDER_SITE_OTHER)
Admission: RE | Admit: 2014-12-24 | Discharge: 2014-12-24 | Disposition: A | Discharge status: Home / Self Care | Payer: Commercial Managed Care - HMO | Source: Ambulatory Visit | Attending: Nurse Practitioner | Admitting: Nurse Practitioner

## 2014-12-24 ENCOUNTER — Ambulatory Visit
Admission: RE | Admit: 2014-12-24 | Discharge: 2014-12-24 | Disposition: A | Discharge status: Home / Self Care | Payer: Commercial Managed Care - HMO | Source: Ambulatory Visit | Attending: Nurse Practitioner | Admitting: Nurse Practitioner

## 2014-12-24 ENCOUNTER — Ambulatory Visit (INDEPENDENT_AMBULATORY_CARE_PROVIDER_SITE_OTHER): Payer: Commercial Managed Care - HMO | Admitting: Nurse Practitioner

## 2014-12-24 ENCOUNTER — Encounter: Payer: Self-pay | Admitting: Nurse Practitioner

## 2014-12-24 VITALS — BP 125/84 | HR 65 | Temp 97.9°F | Ht 65.0 in | Wt 186.0 lb

## 2014-12-24 DIAGNOSIS — M25541 Pain in joints of right hand: Secondary | ICD-10-CM

## 2014-12-24 DIAGNOSIS — R278 Other lack of coordination: Secondary | ICD-10-CM | POA: Diagnosis not present

## 2014-12-24 DIAGNOSIS — M79642 Pain in left hand: Secondary | ICD-10-CM

## 2014-12-24 DIAGNOSIS — N3946 Mixed incontinence: Secondary | ICD-10-CM | POA: Diagnosis not present

## 2014-12-24 DIAGNOSIS — M79641 Pain in right hand: Secondary | ICD-10-CM

## 2014-12-24 DIAGNOSIS — M6281 Muscle weakness (generalized): Secondary | ICD-10-CM | POA: Diagnosis not present

## 2014-12-24 DIAGNOSIS — M62838 Other muscle spasm: Secondary | ICD-10-CM | POA: Diagnosis not present

## 2014-12-24 DIAGNOSIS — M19041 Primary osteoarthritis, right hand: Secondary | ICD-10-CM | POA: Diagnosis not present

## 2014-12-24 DIAGNOSIS — M19042 Primary osteoarthritis, left hand: Secondary | ICD-10-CM | POA: Diagnosis not present

## 2014-12-24 NOTE — Addendum Note (Signed)
Addended by: Audley Hose on: 12/24/2014 04:38 PM   Modules accepted: Orders

## 2014-12-24 NOTE — Progress Notes (Signed)
Pre visit review using our clinic review tool, if applicable. No additional management support is needed unless otherwise documented below in the visit note. 

## 2014-12-24 NOTE — Patient Instructions (Signed)
I will discuss xrays when you come in on the 11th.  Stop prednisone. Continue tylenol 1000 mg up to 3 times daily if needed. Use voltaren gel on painful joints 3 times daily.  Continue natural supplements.  Use heat therapy-rice in bowl.  Nice to see you!

## 2014-12-24 NOTE — Progress Notes (Signed)
Subjective:    [name] [name] is an 69 y.o. female who presents with arthralgias that began several years ago. Pain is located in bilat hands & feet . The pain is described as intermittent, aching.  Associated symptoms include: decreased range of motion, tenderness and stiff. The patient has used tramadol w/relief in foot pain, tylenol with relief in hand pain. 5 mg prednisone daily without relief. She is avoiding NSAIDS due to GFR under 60. Related to injury: no. Saw Dr Estanislado Pandy many years ago-Dx'd w/osteoarthritis. The following portions of the patient's history were reviewed and updated as appropriate: allergies, current medications, past medical history, past social history, past surgical history and problem list.  Review of Systems Constitutional: negative for fevers Integument/breast: negative for rash    Objective:    BP 125/84 mmHg  Pulse 65  Temp(Src) 97.9 F (36.6 C) (Oral)  Ht 5\' 5"  (1.651 m)  Wt 186 lb (84.369 kg)  BMI 30.95 kg/m2  SpO2 98% General appearance: alert, cooperative, appears stated age and no distress Head: Normocephalic, without obvious abnormality, atraumatic Eyes: negative findings: lids and lashes normal and conjunctivae and sclerae normal Extremities: R 1st pip joint swollen, not red or warm. All dip joints tnder. No pain in foot when squeeze across MTP. Neurologic: Grossly normal    Assessment:Plan  1. Arthralgia of hand, right Increase tylenol Use voltaren tid Heat therapy Stop prednisone See pt instructions - DG Hand Complete Right; Future F/u 3 weeks -discuss xrays

## 2015-01-02 ENCOUNTER — Telehealth: Payer: Self-pay

## 2015-01-02 NOTE — Telephone Encounter (Signed)
Please Advise? Patient called stating that [employer] did not send her her Tramadol. She has already called them and they are getting ready to send to her. But she was wondering if she could get you to call in 10 days worth because she has no more. Please send to the [facility]

## 2015-01-03 ENCOUNTER — Other Ambulatory Visit: Payer: Self-pay | Admitting: Nurse Practitioner

## 2015-01-03 DIAGNOSIS — M549 Dorsalgia, unspecified: Secondary | ICD-10-CM

## 2015-01-03 MED ORDER — TRAMADOL HCL 50 MG PO TABS: 50.0000 mg | ORAL_TABLET | Freq: Four times a day (QID) | ORAL | Status: DC | PRN

## 2015-01-03 NOTE — Telephone Encounter (Signed)
Called and informed patient that Tramadol was faxed to [facility].

## 2015-01-07 DIAGNOSIS — H04123 Dry eye syndrome of bilateral lacrimal glands: Secondary | ICD-10-CM | POA: Diagnosis not present

## 2015-01-07 DIAGNOSIS — H43813 Vitreous degeneration, bilateral: Secondary | ICD-10-CM | POA: Diagnosis not present

## 2015-01-07 DIAGNOSIS — H524 Presbyopia: Secondary | ICD-10-CM | POA: Diagnosis not present

## 2015-01-13 ENCOUNTER — Ambulatory Visit (INDEPENDENT_AMBULATORY_CARE_PROVIDER_SITE_OTHER): Payer: Commercial Managed Care - HMO | Admitting: Nurse Practitioner

## 2015-01-13 ENCOUNTER — Encounter: Payer: Self-pay | Admitting: Nurse Practitioner

## 2015-01-13 ENCOUNTER — Telehealth: Payer: Self-pay | Admitting: Nurse Practitioner

## 2015-01-13 VITALS — BP 115/78 | HR 66 | Temp 97.8°F | Ht 65.0 in | Wt 186.0 lb

## 2015-01-13 DIAGNOSIS — M79642 Pain in left hand: Secondary | ICD-10-CM | POA: Diagnosis not present

## 2015-01-13 DIAGNOSIS — M25542 Pain in joints of left hand: Principal | ICD-10-CM

## 2015-01-13 DIAGNOSIS — M07662 Enteropathic arthropathies, left knee: Secondary | ICD-10-CM | POA: Diagnosis not present

## 2015-01-13 DIAGNOSIS — M79641 Pain in right hand: Secondary | ICD-10-CM

## 2015-01-13 DIAGNOSIS — M25541 Pain in joints of right hand: Secondary | ICD-10-CM

## 2015-01-13 DIAGNOSIS — R799 Abnormal finding of blood chemistry, unspecified: Secondary | ICD-10-CM

## 2015-01-13 DIAGNOSIS — M129 Arthropathy, unspecified: Secondary | ICD-10-CM | POA: Diagnosis not present

## 2015-01-13 DIAGNOSIS — M1710 Unilateral primary osteoarthritis, unspecified knee: Secondary | ICD-10-CM

## 2015-01-13 LAB — COMPREHENSIVE METABOLIC PANEL
ALT: 13 U/L (ref 0–35)
AST: 14 U/L (ref 0–37)
Albumin: 4.3 g/dL (ref 3.5–5.2)
Alkaline Phosphatase: 74 U/L (ref 39–117)
BILIRUBIN TOTAL: 0.5 mg/dL (ref 0.2–1.2)
BUN: 25 mg/dL — AB (ref 6–23)
CO2: 33 meq/L — AB (ref 19–32)
Calcium: 10.1 mg/dL (ref 8.4–10.5)
Chloride: 104 mEq/L (ref 96–112)
Creatinine, Ser: 0.94 mg/dL (ref 0.40–1.20)
GFR: 62.97 mL/min (ref >60.00)
GLUCOSE: 84 mg/dL (ref 70–99)
Potassium: 4.8 mEq/L (ref 3.5–5.1)
Sodium: 141 mEq/L (ref 135–145)
Total Protein: 7 g/dL (ref 6.0–8.3)

## 2015-01-13 MED ORDER — PREDNISONE 5 MG PO TABS: 5.0000 mg | ORAL_TABLET | Freq: Every day | ORAL | Status: DC

## 2015-01-13 NOTE — Telephone Encounter (Signed)
pls call pt: Advise Kidney func improved since stopping mobic.

## 2015-01-13 NOTE — Progress Notes (Signed)
Subjective:     [name] [name] is a 69 y.o. female returns for follow up of hand arthritis. She is also complaining of L knee pain, low back, bilat shoulder pain, & ongoing L foot pain. She is tearful today as she discusses her level of pain, financial & family stress. Her opthalmologist started her on restasis for dry eye since last OV. This raises concern for spondyloarthropathy. Hands arthritis: xray interpretation is degenerative changes in radiocarpal joints, dip & pip joints R hand. Same in L Hand w/subchondral cysts in dip & pip joints & interphalangeal changes.No erosive changes.  I am not able to view xrays, but these findings can be consistent w/psoriatic arthritis, inflammatory OA, gout. She is using voltaren gel with some relief. Does not feel tylenol helps-she is using 650 mg qd. She uses tramadol 50 mg qhs with mild relief. Pain is worse after knitting. She is not using heat therapy as discussed. Pain in R wrist has increased since stopping mobic. Knee pain: onset several years. Pain is worse since stopped mobic due to decreased GFR. Pt states pain radiates to entire leg & interferes with gait. Unfortunately, she is not able to use NSAIDS due to decreasing GFR & elevated BUN-she has used mobic for many years. I will check CMET today. Given limited oral treatments for pain, intra-articular injection is option. She has never had steroid inj in knee. Discussed risk & benefits. Pt gives verbal consent.    The following portions of the patient's history were reviewed and updated as appropriate: allergies, current medications, past medical history, past social history, past surgical history and problem list.  Review of Systems Constitutional: negative for fevers Eyes: negative for redness Cardiovascular: negative for palpitations Integument/breast: negative for rash Musculoskeletal:negative for red swollen hot joints    Objective:    BP 115/78 mmHg  Pulse 66  Temp(Src) 97.8 F (36.6 C)  (Oral)  Ht 5\' 5"  (1.651 m)  Wt 186 lb (84.369 kg)  BMI 30.95 kg/m2  SpO2 98% BP 115/78 mmHg  Pulse 66  Temp(Src) 97.8 F (36.6 C) (Oral)  Ht 5\' 5"  (1.651 m)  Wt 186 lb (84.369 kg)  BMI 30.95 kg/m2  SpO2 98% General appearance: alert, cooperative, appears stated age and mild distress Head: Normocephalic, without obvious abnormality, atraumatic Eyes: negative findings: lids and lashes normal and conjunctivae and sclerae normal Extremities: L knee FROM, crepitus, mild warmth, no obvious deformity  Hands: dip & PIP prominence bilat, not able to make complete fist Neurologic: Grossly normal    Procedure note: Verbal consent obtained for intraarticular injection. Potential complications explained including pain, infection.  L knee intraarticular injection: Knee joint accessed from medial aspect in slightly flexed position using sterile technique. Area cleansed with 3 betadine swabs. Ethyl chloride spray applied until skin blanched. Joint space accessed using 25 ga 1 1/2 " needle. Unable to aspirate blood or fluid. 2 ml 1% lidocaine and 40 mg depo-medrol injected into joint space. Withdrew needle. Pt tolerated procedure with minimal pain. Pressure held at site for 1 minute. No bleeding or hematoma noted. Cleaned betadine off skin. Covered site with band-aid.      Assessment:Plan   1. Arthralgia of both hands Tylenol 1000 mg tid curmarin 2000 mg qd Heat therapy increase tramadol to bid Consider adding cymbalta, decrease zoloft Placed "ticket" s I can view xrays. - predniSONE (DELTASONE) 5 MG tablet; Take 1 tablet (5 mg total) by mouth daily with breakfast.  Dispense: 30 tablet; Refill: 1  2. Elevated  BUN LT NSAID use. Stop NSAIDS - Comprehensive metabolic panel  3. Knee arthropathy Intrart inj today Ice knee today See pt instructions F/u 4 weeks.

## 2015-01-13 NOTE — Patient Instructions (Signed)
Start prednisone. Take in the  Morning.  Start turmeric for inflammation. Take 1000 to 2000 mg daily.  Continue voltaren gel 3 times daily. Apply to knees & hands.  Use heat therapy-rice bowl as often as needed.  You may need to see Dr Diana Eves will know once I see your xrays. I will call you.  No long walks today.  Ice knee for 10 minutes about 3 times today.  Return in 4 weeks  Or sooner if you have concerns.

## 2015-01-14 NOTE — Telephone Encounter (Signed)
Called and informed patient of lab results.  

## 2015-01-29 ENCOUNTER — Ambulatory Visit: Payer: Commercial Managed Care - HMO | Admitting: Nurse Practitioner

## 2015-02-10 ENCOUNTER — Ambulatory Visit: Payer: Commercial Managed Care - HMO | Admitting: Nurse Practitioner

## 2015-02-17 DIAGNOSIS — R278 Other lack of coordination: Secondary | ICD-10-CM | POA: Diagnosis not present

## 2015-02-17 DIAGNOSIS — M6281 Muscle weakness (generalized): Secondary | ICD-10-CM | POA: Diagnosis not present

## 2015-02-17 DIAGNOSIS — N3946 Mixed incontinence: Secondary | ICD-10-CM | POA: Diagnosis not present

## 2015-02-17 DIAGNOSIS — M62838 Other muscle spasm: Secondary | ICD-10-CM | POA: Diagnosis not present

## 2015-02-18 ENCOUNTER — Other Ambulatory Visit: Payer: Self-pay

## 2015-02-18 DIAGNOSIS — F329 Major depressive disorder, single episode, unspecified: Secondary | ICD-10-CM

## 2015-02-18 DIAGNOSIS — M25542 Pain in joints of left hand: Secondary | ICD-10-CM

## 2015-02-18 DIAGNOSIS — F32A Depression, unspecified: Secondary | ICD-10-CM

## 2015-02-18 DIAGNOSIS — M25541 Pain in joints of right hand: Secondary | ICD-10-CM

## 2015-02-18 MED ORDER — PREDNISONE 5 MG PO TABS: 5.0000 mg | ORAL_TABLET | Freq: Every day | ORAL | Status: DC

## 2015-02-18 MED ORDER — OMEPRAZOLE 20 MG PO CPDR: 20.0000 mg | DELAYED_RELEASE_CAPSULE | Freq: Every morning | ORAL | Status: DC

## 2015-02-18 MED ORDER — LISINOPRIL-HYDROCHLOROTHIAZIDE 10-12.5 MG PO TABS: 1.0000 | ORAL_TABLET | Freq: Every day | ORAL | Status: DC

## 2015-02-18 MED ORDER — SERTRALINE HCL 100 MG PO TABS: 100.0000 mg | ORAL_TABLET | Freq: Every day | ORAL | Status: DC

## 2015-02-18 NOTE — Telephone Encounter (Signed)
pls sched OV w/in 30 dys. Pt was to come back to let me know if arthritis is better w/prednisone.

## 2015-02-18 NOTE — Telephone Encounter (Addendum)
Please Advise Refill Request? Refill request for- Sertraline 100mg  Tab, Omemprazole 20mg , Lisinopril-HCTZ 10-12.5mg  (90 Day supply), and Prednisone 5mg  tab Last filled by MD on - 09/16/14 Last Appt - 01/13/15        Next Appt - none scheduled   [place]

## 2015-02-19 ENCOUNTER — Other Ambulatory Visit: Payer: Self-pay

## 2015-02-19 MED ORDER — ATENOLOL 25 MG PO TABS: 25.0000 mg | ORAL_TABLET | Freq: Every morning | ORAL | Status: DC

## 2015-02-19 MED ORDER — PRAVASTATIN SODIUM 40 MG PO TABS: ORAL_TABLET | ORAL | Status: DC

## 2015-02-19 NOTE — Telephone Encounter (Signed)
Please Advise Refill Request? Refill request for- Atenolol 25 mg and Pravastatin 40 mg (90 Day supply) Last filled by MD on - 09/16/14 Last Appt - 01/13/15        Next Appt - calling to schedule one [place]

## 2015-02-19 NOTE — Telephone Encounter (Signed)
OV scheduled for 03/19/15 at 815a

## 2015-02-20 ENCOUNTER — Other Ambulatory Visit: Payer: Self-pay

## 2015-02-20 DIAGNOSIS — F32A Depression, unspecified: Secondary | ICD-10-CM

## 2015-02-20 DIAGNOSIS — M25541 Pain in joints of right hand: Secondary | ICD-10-CM

## 2015-02-20 DIAGNOSIS — F329 Major depressive disorder, single episode, unspecified: Secondary | ICD-10-CM

## 2015-02-20 DIAGNOSIS — M25542 Pain in joints of left hand: Principal | ICD-10-CM

## 2015-02-20 MED ORDER — ATENOLOL 25 MG PO TABS: 25.0000 mg | ORAL_TABLET | Freq: Every morning | ORAL | Status: DC

## 2015-02-20 MED ORDER — PREDNISONE 5 MG PO TABS: 5.0000 mg | ORAL_TABLET | Freq: Every day | ORAL | Status: DC

## 2015-02-20 MED ORDER — OMEPRAZOLE 20 MG PO CPDR: 20.0000 mg | DELAYED_RELEASE_CAPSULE | Freq: Every morning | ORAL | Status: DC

## 2015-02-20 MED ORDER — LISINOPRIL-HYDROCHLOROTHIAZIDE 10-12.5 MG PO TABS: 1.0000 | ORAL_TABLET | Freq: Every day | ORAL | Status: DC

## 2015-02-20 MED ORDER — SERTRALINE HCL 100 MG PO TABS: 100.0000 mg | ORAL_TABLET | Freq: Every day | ORAL | Status: DC

## 2015-02-20 MED ORDER — PRAVASTATIN SODIUM 40 MG PO TABS: ORAL_TABLET | ORAL | Status: DC

## 2015-03-05 DIAGNOSIS — N8111 Cystocele, midline: Secondary | ICD-10-CM | POA: Diagnosis not present

## 2015-03-05 DIAGNOSIS — N3020 Other chronic cystitis without hematuria: Secondary | ICD-10-CM | POA: Diagnosis not present

## 2015-03-05 DIAGNOSIS — Z0100 Encounter for examination of eyes and vision without abnormal findings: Secondary | ICD-10-CM | POA: Diagnosis not present

## 2015-03-05 DIAGNOSIS — H524 Presbyopia: Secondary | ICD-10-CM | POA: Diagnosis not present

## 2015-03-05 DIAGNOSIS — H5213 Myopia, bilateral: Secondary | ICD-10-CM | POA: Diagnosis not present

## 2015-03-05 DIAGNOSIS — H52209 Unspecified astigmatism, unspecified eye: Secondary | ICD-10-CM | POA: Diagnosis not present

## 2015-03-05 LAB — HM COLONOSCOPY

## 2015-03-06 ENCOUNTER — Telehealth: Payer: Self-pay | Admitting: Nurse Practitioner

## 2015-03-06 DIAGNOSIS — M2550 Pain in unspecified joint: Secondary | ICD-10-CM

## 2015-03-06 NOTE — Telephone Encounter (Signed)
Pls call pt: ask if she can come in for labs prior to OV next week. I want to perform arthritis panel. She does not need to fast. I will discuss xrays at her f/u appt.

## 2015-03-06 NOTE — Telephone Encounter (Signed)
Lab appt made for 03/11/15

## 2015-03-11 ENCOUNTER — Other Ambulatory Visit (INDEPENDENT_AMBULATORY_CARE_PROVIDER_SITE_OTHER): Payer: Commercial Managed Care - HMO

## 2015-03-11 ENCOUNTER — Other Ambulatory Visit: Payer: Self-pay | Admitting: Nurse Practitioner

## 2015-03-11 DIAGNOSIS — M2550 Pain in unspecified joint: Secondary | ICD-10-CM | POA: Diagnosis not present

## 2015-03-11 DIAGNOSIS — D638 Anemia in other chronic diseases classified elsewhere: Secondary | ICD-10-CM

## 2015-03-11 LAB — COMPREHENSIVE METABOLIC PANEL WITH GFR
ALT: 14 U/L (ref 0–35)
AST: 15 U/L (ref 0–37)
Albumin: 4.4 g/dL (ref 3.5–5.2)
Alkaline Phosphatase: 70 U/L (ref 39–117)
BUN: 22 mg/dL (ref 6–23)
CO2: 33 meq/L — ABNORMAL HIGH (ref 19–32)
Calcium: 10.2 mg/dL (ref 8.4–10.5)
Chloride: 102 meq/L (ref 96–112)
Creatinine, Ser: 1.12 mg/dL (ref 0.40–1.20)
GFR: 51.42 mL/min — ABNORMAL LOW
Glucose, Bld: 91 mg/dL (ref 70–99)
Potassium: 4.4 meq/L (ref 3.5–5.1)
Sodium: 142 meq/L (ref 135–145)
Total Bilirubin: 0.5 mg/dL (ref 0.2–1.2)
Total Protein: 7 g/dL (ref 6.0–8.3)

## 2015-03-11 LAB — CBC
HCT: 37.6 % (ref 36.0–46.0)
Hemoglobin: 12.3 g/dL (ref 12.0–15.0)
MCHC: 32.7 g/dL (ref 30.0–36.0)
MCV: 88.9 fl (ref 78.0–100.0)
Platelets: 298 K/uL (ref 150.0–400.0)
RBC: 4.23 Mil/uL (ref 3.87–5.11)
RDW: 14.2 % (ref 11.5–15.5)
WBC: 8.8 K/uL (ref 4.0–10.5)

## 2015-03-11 LAB — URIC ACID: URIC ACID, SERUM: 5.5 mg/dL (ref 2.4–7.0)

## 2015-03-12 LAB — HLA-B27 ANTIGEN: DNA Result:: NOT DETECTED

## 2015-03-12 LAB — SEDIMENTATION RATE: SED RATE: 32 mm/h — AB (ref 0–22)

## 2015-03-12 LAB — CYCLIC CITRUL PEPTIDE ANTIBODY, IGG: Cyclic Citrullin Peptide Ab: <2 U/mL (ref 0.0–5.0)

## 2015-03-12 LAB — HEPATITIS C ANTIBODY: HCV AB: NEGATIVE

## 2015-03-12 LAB — ANTINUCLEAR ANTIBODIES, IFA: ANA Titer 1: NEGATIVE

## 2015-03-12 LAB — RHEUMATOID FACTOR

## 2015-03-19 ENCOUNTER — Ambulatory Visit (INDEPENDENT_AMBULATORY_CARE_PROVIDER_SITE_OTHER): Payer: Commercial Managed Care - HMO | Admitting: Nurse Practitioner

## 2015-03-19 ENCOUNTER — Encounter: Payer: Self-pay | Admitting: Nurse Practitioner

## 2015-03-19 VITALS — BP 112/75 | HR 63 | Temp 98.4°F | Resp 18 | Ht 65.0 in | Wt 189.0 lb

## 2015-03-19 DIAGNOSIS — M19079 Primary osteoarthritis, unspecified ankle and foot: Secondary | ICD-10-CM | POA: Diagnosis not present

## 2015-03-19 DIAGNOSIS — M19041 Primary osteoarthritis, right hand: Secondary | ICD-10-CM

## 2015-03-19 DIAGNOSIS — R944 Abnormal results of kidney function studies: Secondary | ICD-10-CM

## 2015-03-19 DIAGNOSIS — M1712 Unilateral primary osteoarthritis, left knee: Secondary | ICD-10-CM | POA: Diagnosis not present

## 2015-03-19 DIAGNOSIS — M19042 Primary osteoarthritis, left hand: Secondary | ICD-10-CM

## 2015-03-19 DIAGNOSIS — M1990 Unspecified osteoarthritis, unspecified site: Secondary | ICD-10-CM | POA: Insufficient documentation

## 2015-03-19 HISTORY — DX: Unilateral primary osteoarthritis, left knee: M17.12

## 2015-03-19 MED ORDER — TRAMADOL HCL 50 MG PO TABS: 50.0000 mg | ORAL_TABLET | Freq: Two times a day (BID) | ORAL | Status: DC | PRN

## 2015-03-19 NOTE — Progress Notes (Signed)
Pre visit review using our clinic review tool, if applicable. No additional management support is needed unless otherwise documented below in the visit note. 

## 2015-03-19 NOTE — Progress Notes (Signed)
Subjective:    [name] [name] is an 69 y.o. female  presents for f/u of arthritis in hands, feet, & L knee.  Onset : "many years ago". Pain is located in multiple & bilateral dip, pip, CMC hands & multiple toes-L 4&5 numb, L knee, both hips.. The pain is described as mod to severe inhads & feet, aching, numbing, shooting and throbbing.  Associated symptoms include: crepitation, decreased range of motion, deformity, tenderness and warmth. The patient is using 500 mg tylenol twice daily, 50 mg tramadol at night, 1000 mg curcumarin qd, & has failed trial of 5 mg prednisone qd for 8 weeks. She feels curcumarin has made most difference. NSAIDS were stopped few mos ago due to elevated BUN 33, GFR 56, nml creat. Intrarticular steroid injection performed 2 mos ago in L knee with no relief.  W/u included ANA, RF, aCCP, HLA B27: all neg. ESR was 32-not elevated per ACR standards. Xray bilat hands reveals multiple subchondral cysts in dip & pip joints & R carpal bones, overhanging edges, fusion of R 5th DIP (pt reports Dr Dorothyann Peng performed 3 surgeries on this joint in past). MTP joints are preserved.   The following portions of the patient's history were reviewed and updated as appropriate: allergies, current medications, past family history, past medical history, past social history, past surgical history and problem list.  Review of Systems Pertinent items are noted in HPI.    Objective:    BP 112/75 mmHg  Pulse 63  Temp(Src) 98.4 F (36.9 C) (Temporal)  Resp 18  Ht _0  (1.651 m)  Wt 189 lb (85.73 kg)  BMI 31.45 kg/m2  SpO2 97% General appearance: alert, cooperative, appears stated age and no distress MSK: Hands: prominent cmc, dip & pip joints with tenderness, incomplete ROM w/fist L more limited than R L knee: slightly warm, no apparent effusion, crepitus      Assessment: PLan   1. Primary osteoarthritis of left knee - Turmeric Curcumin 500 MG CAPS; Take 1,000 mg by mouth. - traMADol (ULTRAM)  50 MG tablet; Take 1 tablet (50 mg total) by mouth 2 (two) times daily as needed.  Dispense: 45 tablet; Refill: 1 Tylenol 1000 mg TID PRN Heat therapy-rice bowl voltaren gel PRN - Ambulatory referral to Rheumatology-discuss hyalgan therapy  2. Primary osteoarthritis of foot, unspecified laterality continue - Turmeric Curcumin 500 MG CAPS; Take 1,000 mg by mouth. - traMADol (ULTRAM) 50 MG tablet; Take 1 tablet (50 mg total) by mouth 2 (two) times daily as needed.  Dispense: 45 tablet; Refill: 1 Tylenol 1000 mg TID PRN Heat therapy-rice bowl voltaren gel PRN Wean off prednisone: 1/2 T PO qd X 7 days - Ambulatory referral to Rheumatology  3. Primary osteoarthritis of both hands continue - Turmeric Curcumin 500 MG CAPS; Take 1,000 mg by mouth. - traMADol (ULTRAM) 50 MG tablet; Take 1 tablet (50 mg total) by mouth 2 (two) times daily as needed.  Dispense: 45 tablet; Refill: 1 Tylenol 1000 mg TID PRN Heat therapy-rice bowl voltaren gel PRN Wean off prednisone: 1/2 T PO qd X 7 days - Ambulatory referral to Rheumatology  4. Decreased GFR No NSAIDS  F/u 5 mos-lipids, htn, cmet, vit D, B12, CBC,

## 2015-03-19 NOTE — Patient Instructions (Signed)
Decrease prednisone to 1/2 tablet daily for 1 week, then stop.  Continue 1000 mg curcumarin daily. Add up to 1000 mg tylenol 3 times daily if needed. Use heat therapy as needed. Use voltaren gel when needed.  Please see Dr Estanislado Pandy regarding arthritis of knee, hands & feet.  See me in November.   Nice to see you!

## 2015-03-27 ENCOUNTER — Other Ambulatory Visit: Payer: Self-pay | Admitting: Nurse Practitioner

## 2015-06-02 ENCOUNTER — Other Ambulatory Visit: Payer: Self-pay

## 2015-06-02 MED ORDER — LISINOPRIL-HYDROCHLOROTHIAZIDE 10-12.5 MG PO TABS: 1.0000 | ORAL_TABLET | Freq: Every day | ORAL | Status: DC

## 2015-06-02 MED ORDER — OMEPRAZOLE 20 MG PO CPDR: 20.0000 mg | DELAYED_RELEASE_CAPSULE | Freq: Every morning | ORAL | Status: DC

## 2015-06-05 ENCOUNTER — Other Ambulatory Visit: Payer: Self-pay

## 2015-06-05 DIAGNOSIS — F329 Major depressive disorder, single episode, unspecified: Secondary | ICD-10-CM

## 2015-06-05 DIAGNOSIS — F32A Depression, unspecified: Secondary | ICD-10-CM

## 2015-06-05 MED ORDER — SERTRALINE HCL 100 MG PO TABS: 100.0000 mg | ORAL_TABLET | Freq: Every day | ORAL | Status: DC

## 2015-06-11 ENCOUNTER — Other Ambulatory Visit: Payer: Self-pay

## 2015-06-11 DIAGNOSIS — Z1231 Encounter for screening mammogram for malignant neoplasm of breast: Secondary | ICD-10-CM

## 2015-06-26 DIAGNOSIS — M25561 Pain in right knee: Secondary | ICD-10-CM | POA: Diagnosis not present

## 2015-06-26 DIAGNOSIS — M5030 Other cervical disc degeneration, unspecified cervical region: Secondary | ICD-10-CM | POA: Diagnosis not present

## 2015-06-26 DIAGNOSIS — M79672 Pain in left foot: Secondary | ICD-10-CM | POA: Diagnosis not present

## 2015-06-26 DIAGNOSIS — M19041 Primary osteoarthritis, right hand: Secondary | ICD-10-CM | POA: Diagnosis not present

## 2015-06-26 DIAGNOSIS — M79671 Pain in right foot: Secondary | ICD-10-CM | POA: Diagnosis not present

## 2015-06-30 ENCOUNTER — Other Ambulatory Visit: Payer: Self-pay | Admitting: Family Medicine

## 2015-06-30 ENCOUNTER — Telehealth: Payer: Self-pay | Admitting: Family Medicine

## 2015-06-30 MED ORDER — ALPRAZOLAM 0.5 MG PO TABS: ORAL_TABLET | ORAL | Status: DC

## 2015-06-30 NOTE — Telephone Encounter (Signed)
Pt aware.  Appt scheduled 07/25/15.  [facility] request.

## 2015-06-30 NOTE — Telephone Encounter (Signed)
Pt requesting xanax refill. I will provide a 30 day  Only prescription since this is on her chronic medication list, however I will need to evaluate her and discuss management prior to any future refills. Pt should be encouraged to schedule appt with in 3 weeks to prevent running out of medication again.

## 2015-06-30 NOTE — Telephone Encounter (Signed)
Please see telephone note

## 2015-06-30 NOTE — Telephone Encounter (Signed)
Patient requesting refill of alprazolam.  Pt has been advised that you do not Rx this medication but pt insisted on me asking you if you would send her in Rx.  She is out of medication.  Last RX sent to [facility] 09/16/14 # 270 x no refills.   Please advise.

## 2015-07-01 DIAGNOSIS — R5383 Other fatigue: Secondary | ICD-10-CM | POA: Diagnosis not present

## 2015-07-01 DIAGNOSIS — R259 Unspecified abnormal involuntary movements: Secondary | ICD-10-CM | POA: Diagnosis not present

## 2015-07-01 DIAGNOSIS — M2550 Pain in unspecified joint: Secondary | ICD-10-CM | POA: Diagnosis not present

## 2015-07-01 DIAGNOSIS — Z111 Encounter for screening for respiratory tuberculosis: Secondary | ICD-10-CM | POA: Diagnosis not present

## 2015-07-01 DIAGNOSIS — R5381 Other malaise: Secondary | ICD-10-CM | POA: Diagnosis not present

## 2015-07-01 DIAGNOSIS — R52 Pain, unspecified: Secondary | ICD-10-CM | POA: Diagnosis not present

## 2015-07-23 ENCOUNTER — Ambulatory Visit
Admission: RE | Admit: 2015-07-23 | Discharge: 2015-07-23 | Disposition: A | Discharge status: Home / Self Care | Payer: Commercial Managed Care - HMO | Source: Ambulatory Visit

## 2015-07-23 DIAGNOSIS — Z0000 Encounter for general adult medical examination without abnormal findings: Secondary | ICD-10-CM

## 2015-07-23 DIAGNOSIS — Z1231 Encounter for screening mammogram for malignant neoplasm of breast: Secondary | ICD-10-CM | POA: Diagnosis not present

## 2015-07-23 LAB — HM MAMMOGRAPHY

## 2015-07-24 ENCOUNTER — Other Ambulatory Visit: Payer: Self-pay | Admitting: *Deleted

## 2015-07-24 DIAGNOSIS — M8580 Other specified disorders of bone density and structure, unspecified site: Secondary | ICD-10-CM | POA: Diagnosis not present

## 2015-07-24 DIAGNOSIS — G5602 Carpal tunnel syndrome, left upper limb: Secondary | ICD-10-CM | POA: Diagnosis not present

## 2015-07-24 DIAGNOSIS — M170 Bilateral primary osteoarthritis of knee: Secondary | ICD-10-CM | POA: Diagnosis not present

## 2015-07-24 DIAGNOSIS — G5601 Carpal tunnel syndrome, right upper limb: Secondary | ICD-10-CM | POA: Diagnosis not present

## 2015-07-24 DIAGNOSIS — M797 Fibromyalgia: Secondary | ICD-10-CM | POA: Diagnosis not present

## 2015-07-25 ENCOUNTER — Ambulatory Visit (INDEPENDENT_AMBULATORY_CARE_PROVIDER_SITE_OTHER): Payer: Commercial Managed Care - HMO | Admitting: Family Medicine

## 2015-07-25 ENCOUNTER — Encounter: Payer: Self-pay | Admitting: Family Medicine

## 2015-07-25 VITALS — BP 125/82 | HR 69 | Temp 90.0°F | Resp 20 | Ht 65.0 in | Wt 190.8 lb

## 2015-07-25 DIAGNOSIS — M19041 Primary osteoarthritis, right hand: Secondary | ICD-10-CM

## 2015-07-25 DIAGNOSIS — F411 Generalized anxiety disorder: Secondary | ICD-10-CM | POA: Diagnosis not present

## 2015-07-25 DIAGNOSIS — G4700 Insomnia, unspecified: Secondary | ICD-10-CM | POA: Diagnosis not present

## 2015-07-25 DIAGNOSIS — M1712 Unilateral primary osteoarthritis, left knee: Secondary | ICD-10-CM | POA: Diagnosis not present

## 2015-07-25 DIAGNOSIS — M19079 Primary osteoarthritis, unspecified ankle and foot: Secondary | ICD-10-CM

## 2015-07-25 DIAGNOSIS — M19042 Primary osteoarthritis, left hand: Secondary | ICD-10-CM

## 2015-07-25 DIAGNOSIS — Z299 Encounter for prophylactic measures, unspecified: Secondary | ICD-10-CM

## 2015-07-25 HISTORY — DX: Insomnia, unspecified: G47.00

## 2015-07-25 MED ORDER — TRAMADOL HCL 50 MG PO TABS
50.0000 mg | ORAL_TABLET | Freq: Two times a day (BID) | ORAL | Status: DC | PRN
Start: 2015-07-25 — End: 2015-09-11

## 2015-07-25 MED ORDER — DULOXETINE HCL 30 MG PO CPEP: ORAL_CAPSULE | ORAL | Status: DC

## 2015-07-25 MED ORDER — TRAZODONE HCL 50 MG PO TABS: 50.0000 mg | ORAL_TABLET | Freq: Every evening | ORAL | Status: DC | PRN

## 2015-07-25 MED ORDER — ZOSTER VACCINE LIVE 19400 UNT/0.65ML ~~LOC~~ SOLR: 0.6500 mL | Freq: Once | SUBCUTANEOUS | Status: DC

## 2015-07-25 NOTE — Patient Instructions (Signed)
I have refilled your tramadol, ideally we would want to use this only in moderate to severe pain.  We are going to start Cymbalta 30 mg for 2 weeks, then take 60 mg and follow up at the 4 week mark. Start trazodone 1 hour before bed.  I have prescribed the shingles shot, take this to your pharmacy to have administered.   Health Maintenance, Female Adopting a healthy lifestyle and getting preventive care can go a long way to promote health and wellness. Talk with your health care provider about what schedule of regular examinations is right for you. This is a good chance for you to check in with your provider about disease prevention and staying healthy. In between checkups, there are plenty of things you can do on your own. Experts have done a lot of research about which lifestyle changes and preventive measures are most likely to keep you healthy. Ask your health care provider for more information. WEIGHT AND DIET  Eat a healthy diet  Be sure to include plenty of vegetables, fruits, low-fat dairy products, and lean protein.  Do not eat a lot of foods high in solid fats, added sugars, or salt.  Get regular exercise. This is one of the most important things you can do for your health.  Most adults should exercise for at least 150 minutes each week. The exercise should increase your heart rate and make you sweat (moderate-intensity exercise).  Most adults should also do strengthening exercises at least twice a week. This is in addition to the moderate-intensity exercise.  Maintain a healthy weight  Body mass index (BMI) is a measurement that can be used to identify possible weight problems. It estimates body fat based on height and weight. Your health care provider can help determine your BMI and help you achieve or maintain a healthy weight.  For females 49 years of age and older:   A BMI below 18.5 is considered underweight.  A BMI of 18.5 to 24.9 is normal.  A BMI of 25 to 29.9 is  considered overweight.  A BMI of 30 and above is considered obese.  Watch levels of cholesterol and blood lipids  You should start having your blood tested for lipids and cholesterol at 68 years of age, then have this test every 5 years.  You may need to have your cholesterol levels checked more often if:  Your lipid or cholesterol levels are high.  You are older than 68 years of age.  You are at high risk for heart disease.  CANCER SCREENING   Lung Cancer  Lung cancer screening is recommended for adults 29-43 years old who are at high risk for lung cancer because of a history of smoking.  A yearly low-dose CT scan of the lungs is recommended for people who:  Currently smoke.  Have quit within the past 15 years.  Have at least a 30-pack-year history of smoking. A pack year is smoking an average of one pack of cigarettes a day for 1 year.  Yearly screening should continue until it has been 15 years since you quit.  Yearly screening should stop if you develop a health problem that would prevent you from having lung cancer treatment.  Breast Cancer  Practice breast self-awareness. This means understanding how your breasts normally appear and feel.  It also means doing regular breast self-exams. Let your health care provider know about any changes, no matter how small.  If you are in your 20s or 30s, you  should have a clinical breast exam (CBE) by a health care provider every 1-3 years as part of a regular health exam.  If you are 21 or older, have a CBE every year. Also consider having a breast X-ray (mammogram) every year.  If you have a family history of breast cancer, talk to your health care provider about genetic screening.  If you are at high risk for breast cancer, talk to your health care provider about having an MRI and a mammogram every year.  Breast cancer gene (BRCA) assessment is recommended for women who have family members with BRCA-related cancers.  BRCA-related cancers include:  Breast.  Ovarian.  Tubal.  Peritoneal cancers.  Results of the assessment will determine the need for genetic counseling and BRCA1 and BRCA2 testing. Cervical Cancer Your health care provider may recommend that you be screened regularly for cancer of the pelvic organs (ovaries, uterus, and vagina). This screening involves a pelvic examination, including checking for microscopic changes to the surface of your cervix (Pap test). You may be encouraged to have this screening done every 3 years, beginning at age 68.  For women ages 66-65, health care providers may recommend pelvic exams and Pap testing every 3 years, or they may recommend the Pap and pelvic exam, combined with testing for human papilloma virus (HPV), every 5 years. Some types of HPV increase your risk of cervical cancer. Testing for HPV may also be done on women of any age with unclear Pap test results.  Other health care providers may not recommend any screening for nonpregnant women who are considered low risk for pelvic cancer and who do not have symptoms. Ask your health care provider if a screening pelvic exam is right for you.  If you have had past treatment for cervical cancer or a condition that could lead to cancer, you need Pap tests and screening for cancer for at least 20 years after your treatment. If Pap tests have been discontinued, your risk factors (such as having a new sexual partner) need to be reassessed to determine if screening should resume. Some women have medical problems that increase the chance of getting cervical cancer. In these cases, your health care provider may recommend more frequent screening and Pap tests. Colorectal Cancer  This type of cancer can be detected and often prevented.  Routine colorectal cancer screening usually begins at 68 years of age and continues through 68 years of age.  Your health care provider may recommend screening at an earlier age if you  have risk factors for colon cancer.  Your health care provider may also recommend using home test kits to check for hidden blood in the stool.  A small camera at the end of a tube can be used to examine your colon directly (sigmoidoscopy or colonoscopy). This is done to check for the earliest forms of colorectal cancer.  Routine screening usually begins at age 72.  Direct examination of the colon should be repeated every 5-10 years through 68 years of age. However, you may need to be screened more often if early forms of precancerous polyps or small growths are found. Skin Cancer  Check your skin from head to toe regularly.  Tell your health care provider about any new moles or changes in moles, especially if there is a change in a mole's shape or color.  Also tell your health care provider if you have a mole that is larger than the size of a pencil eraser.  Always use sunscreen.  Apply sunscreen liberally and repeatedly throughout the day.  Protect yourself by wearing long sleeves, pants, a wide-brimmed hat, and sunglasses whenever you are outside. HEART DISEASE, DIABETES, AND HIGH BLOOD PRESSURE   High blood pressure causes heart disease and increases the risk of stroke. High blood pressure is more likely to develop in:  People who have blood pressure in the high end of the normal range (130-139/85-89 mm Hg).  People who are overweight or obese.  People who are African American.  If you are 96-41 years of age, have your blood pressure checked every 3-5 years. If you are 12 years of age or older, have your blood pressure checked every year. You should have your blood pressure measured twice--once when you are at a hospital or clinic, and once when you are not at a hospital or clinic. Record the average of the two measurements. To check your blood pressure when you are not at a hospital or clinic, you can use:  An automated blood pressure machine at a pharmacy.  A home blood pressure  monitor.  If you are between 1 years and 15 years old, ask your health care provider if you should take aspirin to prevent strokes.  Have regular diabetes screenings. This involves taking a blood sample to check your fasting blood sugar level.  If you are at a normal weight and have a low risk for diabetes, have this test once every three years after 68 years of age.  If you are overweight and have a high risk for diabetes, consider being tested at a younger age or more often. PREVENTING INFECTION  Hepatitis B  If you have a higher risk for hepatitis B, you should be screened for this virus. You are considered at high risk for hepatitis B if:  You were born in a country where hepatitis B is common. Ask your health care provider which countries are considered high risk.  Your parents were born in a high-risk country, and you have not been immunized against hepatitis B (hepatitis B vaccine).  You have HIV or AIDS.  You use needles to inject street drugs.  You live with someone who has hepatitis B.  You have had sex with someone who has hepatitis B.  You get hemodialysis treatment.  You take certain medicines for conditions, including cancer, organ transplantation, and autoimmune conditions. Hepatitis C  Blood testing is recommended for:  Everyone born from [birth year] through 1964.  Anyone with known risk factors for hepatitis C. Sexually transmitted infections (STIs)  You should be screened for sexually transmitted infections (STIs) including gonorrhea and chlamydia if:  You are sexually active and are younger than 68 years of age.  You are older than 68 years of age and your health care provider tells you that you are at risk for this type of infection.  Your sexual activity has changed since you were last screened and you are at an increased risk for chlamydia or gonorrhea. Ask your health care provider if you are at risk.  If you do not have HIV, but are at risk, it may be  recommended that you take a prescription medicine daily to prevent HIV infection. This is called pre-exposure prophylaxis (PrEP). You are considered at risk if:  You are sexually active and do not regularly use condoms or know the HIV status of your partner(s).  You take drugs by injection.  You are sexually active with a partner who has HIV. Talk with your health care provider about  whether you are at high risk of being infected with HIV. If you choose to begin PrEP, you should first be tested for HIV. You should then be tested every 3 months for as long as you are taking PrEP.  PREGNANCY   If you are premenopausal and you may become pregnant, ask your health care provider about preconception counseling.  If you may become pregnant, take 400 to 800 micrograms (mcg) of folic acid every day.  If you want to prevent pregnancy, talk to your health care provider about birth control (contraception). OSTEOPOROSIS AND MENOPAUSE   Osteoporosis is a disease in which the bones lose minerals and strength with aging. This can result in serious bone fractures. Your risk for osteoporosis can be identified using a bone density scan.  If you are 70 years of age or older, or if you are at risk for osteoporosis and fractures, ask your health care provider if you should be screened.  Ask your health care provider whether you should take a calcium or vitamin D supplement to lower your risk for osteoporosis.  Menopause may have certain physical symptoms and risks.  Hormone replacement therapy may reduce some of these symptoms and risks. Talk to your health care provider about whether hormone replacement therapy is right for you.  HOME CARE INSTRUCTIONS   Schedule regular health, dental, and eye exams.  Stay current with your immunizations.   Do not use any tobacco products including cigarettes, chewing tobacco, or electronic cigarettes.  If you are pregnant, do not drink alcohol.  If you are  breastfeeding, limit how much and how often you drink alcohol.  Limit alcohol intake to no more than 1 drink per day for nonpregnant women. One drink equals 12 ounces of beer, 5 ounces of wine, or 1 ounces of hard liquor.  Do not use street drugs.  Do not share needles.  Ask your health care provider for help if you need support or information about quitting drugs.  Tell your health care provider if you often feel depressed.  Tell your health care provider if you have ever been abused or do not feel safe at home.   This information is not intended to replace advice given to you by your health care provider. Make sure you discuss any questions you have with your health care provider.   Document Released: 04/05/2011 Document Revised: 10/11/2014 Document Reviewed: 08/22/2013 Elsevier Interactive Patient Education [employer].

## 2015-07-25 NOTE — Progress Notes (Signed)
Subjective:    Patient ID: [name] [name], female    DOB: 09-04-1946, 69 y.o.   MRN: [id number]  HPI  Anxiety: Patient presents for scheduled visit for follow-up on her anxiety. Patient states that she has had problems with anxiety for approximately 20 years since her first divorce. She describes the feeling as sometimes not being able to catch her breath or feeling like her insides are crawling. She reports since her arthritis has worsened over the last 1-2 months, she feels like her anxiety has increasing because she is concerned about her health. Currently she is prescribed Xanax 0.5 mg, that she states she takes nightly. When her family member was passing away, she did take 1 or 2 during the day. She also takes Zoloft 100 mg daily. Patient states she is unable to sleep at night, and as the main reason she takes the Xanax. She goes to bed at approximately 11 PM every night. She takes Tylenol PM as well to help her sleep.  Arthritis: Patient sees dermatology for her arthritis, she states it is worsening. She has arthritis of both hands her left knee and is experiencing bilateral hip pain now. She does take tramadol approximately once a day to help with the pain. She also takes multiple supplements including ginger, fish oil, tumor, vitamin D and calcium. She is also prescribed Flexeril, Voltaren. She brings with her lab work from her rheumatologist's office today. Work was collected on 06/13/2015 CMP was normal, with the exception of a very mildly elevated creatinine 1.07. SPEP with all normal parameters, and no M spike observed. Iron and TIBC normal, hemoglobin A1c 5.6. TSH 2.5 to, CCP normal, creatinine kinase normal, magnesium normal, mildly high ferritin, patient is on supplements. PTH intact normal. These records have been reviewed and scanned into the system.  Past Medical History  Diagnosis Date  . Unspecified essential hypertension   . Unspecified venous (peripheral) insufficiency   .  Irritable bowel syndrome   . Unspecified vitamin D deficiency   . Pernicious anemia   . History of gastritis     2007  . History of duodenal ulcer     2007  . Fibromyalgia   . Anxiety   . Hyperlipidemia   . Chronic cystitis   . Internal and external hemorrhoids without complication   . GERD (gastroesophageal reflux disease)   . PONV (postoperative nausea and vomiting)   . Intrinsic (urethral) sphincter deficiency (ISD)   . Urine incontinence    Allergies  Allergen Reactions  . Morphine Nausea And Vomiting    SEVERE  . Sulfa Antibiotics Swelling   Past Surgical History  Procedure Laterality Date  . Tubal ligation    . Total abdominal hysterectomy  1986  . Anterior cervical decomp/discectomy fusion  1999  . Bilateral salpingoophorectomy  1984  . Dilation and curettage of uterus    . Appendectomy  1984  . Excision right neck and left breast sebaceous cyst  05-18-2002  . Anterior and posterior vaginal repair  04-29-2004    AND TRANSVAGINAL TAPE SLING  . Cardiac catheterization  10-03-2002   DR DEGENT    NORMAL CORONARIES ARTERIES/  EF 55%  . Vaginal prolapse repair N/A 08/27/2013    Procedure: ANTERIOR VAGINAL VAULT SUSPENSION, KELLY PLICATION WITH SACROSPINOUS LIGAMENT FIXATION AND XENFORM BOVINE DERMIS GRAFT AUGMENTATION, URETHRAL EXPLORATION, URETHROLYSIS, EXPLANTATION OF TVT TAPE, IMPLANTATION OF FLOSEAL, IMPLANTATION OF XENFORM BOVINE GRAFT IN PERIURETHRAL SPACE;  Surgeon: Ailene Rud, MD;  Location: [place];  Service: Urology;  Laterality: N/A  . Cystoscopy N/A 08/27/2013    Procedure: CYSTOSCOPY;  Surgeon: Ailene Rud, MD;  Location: [facility];  Service: Urology;  Laterality: N/A;  . Cystoscopy with injection N/A 12/17/2013    Procedure: MACROPLASQTIQUE WITH INJECTION;  Surgeon: Ailene Rud, MD;  Location: [facility];  Service: Urology;  Laterality: N/A;  . Hemorrhoid banding     Social  History   Social History  . Marital Status: Divorced    Spouse Name: N/A  . Number of Children: 1  . Years of Education: N/A   Occupational History  . Retired    Social History Main Topics  . Smoking status: Former Smoker -- 1.00 packs/day for 15 years    Types: Cigarettes    Start date: 12/06/1993    Quit date: 08/09/2009  . Smokeless tobacco: Never Used     Comment: quit smoking 5 years ago  . Alcohol Use: Yes     Comment: ocassional  . Drug Use: No  . Sexual Activity: No   Other Topics Concern  . Not on file   Social History Narrative    Review of Systems Negative, with the exception of above mentioned in HPI     Objective:   Physical Exam BP 125/82 mmHg  Pulse 69  Temp(Src) 90 F (32.2 C) (Oral)  Resp 20  Ht 5\' 5"  (1.651 m)  Wt 190 lb 12.8 oz (86.546 kg)  BMI 31.75 kg/m2  SpO2 95% Gen: Afebrile. No acute distress. Nontoxic in appearance, well-developed, well-nourished Caucasian female, very pleasant. HENT: AT. [place].MMM.  Eyes:Pupils Equal Round Reactive to light, Extraocular movements intact,  Conjunctiva without redness, discharge or icterus. Psych: Normal affect, dress and demeanor. Normal speech. Normal thought content and judgment..      Assessment & Plan:  1. Generalized anxiety disorder - Patient to continue Zoloft 100 mg. She can attempt to take this in the evening to see if it makes a difference in her sleep pattern. - Have also started Cymbalta for both increased anxiety and increase in her muscle skeletal pain. - Patient encouraged to discontinue Xanax  2. Insomnia - Patient encouraged to try to discontinue Xanax - Patient educated take this one hour before bedtime, attempt to go the same time every night, sleep hygiene was discussed. - traZODone (DESYREL) 50 MG tablet; Take 1 tablet (50 mg total) by mouth at bedtime as needed for sleep.  Dispense: 30 tablet; Refill: 0  3. Primary osteoarthritis of both hands - Start Cymbalta, follow-up in 3-4  weeks - DULoxetine (CYMBALTA) 30 MG capsule; 30 mg QD for 2 weeks,then 60 mg QD  Dispense: 60 capsule; Refill: 0 - traMADol (ULTRAM) 50 MG tablet; Take 1 tablet (50 mg total) by mouth 2 (two) times daily as needed.  Dispense: 45 tablet; Refill: 1  4. Primary osteoarthritis of left knee - Refill on tramadol today - traMADol (ULTRAM) 50 MG tablet; Take 1 tablet (50 mg total) by mouth 2 (two) times daily as needed.  Dispense: 45 tablet; Refill: 1  5. Primary osteoarthritis of foot, unspecified laterality - traMADol (ULTRAM) 50 MG tablet; Take 1 tablet (50 mg total) by mouth 2 (two) times daily as needed.  Dispense: 45 tablet; Refill: 1  6. Preventive measure - zoster vaccine live, PF, (ZOSTAVAX) 64403 UNT/0.65ML injection; Inject 19,400 Units into the skin once.  Dispense: 1 each; Refill: 0  Follow-up 4 weeks

## 2015-08-05 ENCOUNTER — Other Ambulatory Visit: Payer: Self-pay | Admitting: Family Medicine

## 2015-08-08 ENCOUNTER — Other Ambulatory Visit: Payer: Self-pay | Admitting: *Deleted

## 2015-08-08 MED ORDER — PRAVASTATIN SODIUM 40 MG PO TABS: ORAL_TABLET | ORAL | Status: DC

## 2015-08-08 MED ORDER — ATENOLOL 25 MG PO TABS: 25.0000 mg | ORAL_TABLET | Freq: Every morning | ORAL | Status: DC

## 2015-08-08 NOTE — Telephone Encounter (Signed)
Atenolol and pravastatin refilled per refill protocol

## 2015-08-14 ENCOUNTER — Other Ambulatory Visit: Payer: Self-pay | Admitting: *Deleted

## 2015-08-14 MED ORDER — ATENOLOL 25 MG PO TABS: 25.0000 mg | ORAL_TABLET | Freq: Every morning | ORAL | Status: DC

## 2015-08-14 MED ORDER — PRAVASTATIN SODIUM 40 MG PO TABS: ORAL_TABLET | ORAL | Status: DC

## 2015-08-14 NOTE — Telephone Encounter (Signed)
Resent atenolol and pravastatin Rx to [facility]

## 2015-08-22 ENCOUNTER — Encounter: Payer: Self-pay | Admitting: Family Medicine

## 2015-08-22 ENCOUNTER — Ambulatory Visit (INDEPENDENT_AMBULATORY_CARE_PROVIDER_SITE_OTHER): Payer: Commercial Managed Care - HMO | Admitting: Family Medicine

## 2015-08-22 VITALS — BP 112/75 | HR 74 | Temp 97.8°F | Resp 20 | Wt 189.5 lb

## 2015-08-22 DIAGNOSIS — R490 Dysphonia: Secondary | ICD-10-CM | POA: Diagnosis not present

## 2015-08-22 DIAGNOSIS — M19041 Primary osteoarthritis, right hand: Secondary | ICD-10-CM | POA: Diagnosis not present

## 2015-08-22 DIAGNOSIS — M19042 Primary osteoarthritis, left hand: Secondary | ICD-10-CM

## 2015-08-22 DIAGNOSIS — G4700 Insomnia, unspecified: Secondary | ICD-10-CM | POA: Diagnosis not present

## 2015-08-22 DIAGNOSIS — F418 Other specified anxiety disorders: Secondary | ICD-10-CM

## 2015-08-22 HISTORY — DX: Dysphonia: R49.0

## 2015-08-22 MED ORDER — TRAZODONE HCL 50 MG PO TABS: 50.0000 mg | ORAL_TABLET | Freq: Every evening | ORAL | Status: DC | PRN

## 2015-08-22 MED ORDER — DULOXETINE HCL 30 MG PO CPEP: ORAL_CAPSULE | ORAL | Status: DC

## 2015-08-22 NOTE — Patient Instructions (Signed)
  Trazodone refilled for sleep. Take 1 a night, unless you are having a bad night you may take two, but try to not make this a habit. I have called a 1 month at [facility] and 3 months at [facility]. When you run out of this script it is time for an appt.  Start new antihistamine (zyrtec or allegra) if no improvement on your hoarseness in 1 week, then start  prilosec 40 mg (2 tabs) until yo usee me Dec. 8. If no improvement will likely send you to ENT or GI. At least 2 days prior to appt (Dec.8) come in for fasting labs (schedule a lab appt) , so they will completed and we can review them at your appt physical.

## 2015-08-22 NOTE — Progress Notes (Signed)
Subjective:    Patient ID: [name] [name], female    DOB: January 15, 1946, 69 y.o.   MRN: [id number]  HPI  Anxiety:  Patient has continued taking Zoloft 100 mg daily , on last appointment we also added Cymbalta 30 mg taper to 60 mg. Patient states with the addition of the Cymbalta she is feeling much better. She reports she is also getting some additional benefits with her fibromyalgia and arthritis pain. Patient is taking 60 mg of Cymbalta daily , and does not report any negative side effects.  Insomnia:  One month ago patient was complaining of insomnia, she was taking Xanax and Tylenol PM in order to follow sleep at night. After discussion on long-term benzodiazepine use and benefits versus risks, patient was started on trazodone 50 mg daily at bedtime. She was encouraged to discontinue the Tylenol PM use nightly. Patient states she has not taking the Xanax any longer, and she has not needed the Tylenol PM. She feels the trazodone is working well, she is getting more restful sleep. She is able to get up from 27 hours of solid sleep at night. She states there was 190 in which she was having difficulty falling asleep , but this because she had more in her mind that evening, and his risk for that to happen as long as she is using the trazodone. She denies any negative side effects to the use of trazodone.  Hoarseness:  Patient complains of hoarseness that she states has been present for at least a year. She thought it was her allergies , and has been taking an antihistamine intermittently. She does have a history of gastric reflux  Intake omeprazole 20 mg daily. She does not follow a GERD diet. She does admit to increased burning sensation in the back of her throat , especially when laying flat. Patient was a former smoker.   Former Smoker  Past Medical History  Diagnosis Date  . Unspecified essential hypertension   . Unspecified venous (peripheral) insufficiency   . Irritable bowel syndrome   .  Unspecified vitamin D deficiency   . Pernicious anemia   . History of gastritis     2007  . History of duodenal ulcer     2007  . Fibromyalgia   . Anxiety   . Hyperlipidemia   . Chronic cystitis   . Internal and external hemorrhoids without complication   . GERD (gastroesophageal reflux disease)   . PONV (postoperative nausea and vomiting)   . Intrinsic (urethral) sphincter deficiency (ISD)   . Urine incontinence    Review of Systems Negative, with the exception of above mentioned in HPI     Objective:   Physical Exam BP 112/75 mmHg  Pulse 74  Temp(Src) 97.8 F (36.6 C) (Oral)  Resp 20  Wt 189 lb 8 oz (85.957 kg)  SpO2 95% Gen: Afebrile. No acute distress.  Nontoxic in appearance, well-developed, well-nourished, Caucasian female. HENT: AT. [place].. MMM. Throat without erythema or exudates.  Mild hoarseness on exam. No cough on exam. Eyes:Pupils Equal Round Reactive to light, Extraocular movements intact,  Conjunctiva without redness, discharge or icterus. Neck/lymp/endocrine: Supple, nolymphadenopathy Psych: Normal affect, dress and demeanor. Normal speech. Normal thought content and judgment.     Assessment & Plan:  1. Insomnia - Improved with trazodone. Continue trazodone, refills called today to [facility] for 1 month and [facility] for 3 months. Pt may take 1-2 tabs, encouraged 1 tab unless she absolutely needs additional.  - discontinued xanax, and she  is no longer using Tylenol PM.  - traZODone (DESYREL) 50 MG tablet; Take 1-2 tablets (50-100 mg total) by mouth at bedtime as needed for sleep.  Dispense: 90 tablet; Refill: 0  2. Depression with anxiety - Improved on cymbalta. Continue 30 mg BID. 1 month refill called to [facility], 3 months called to [facility].  - Continue zoloft - discontinued xanax, and she is no longer using Tylenol PM.   3. Primary osteoarthritis of both hands - Pain improved with addition of Cymbalta.  - DULoxetine (CYMBALTA) 30 MG capsule; 30 mg QD for 2 weeks,then  60 mg QD  Dispense: 60 capsule; Refill: 0  4. Hoarseness of voice - New problem. Pt encouraged to start new antihistamine nightly, if no improvement in 1 week, she is to start 40 mg of prilosec until her next appt dec.8. If no improvement will refer to ENT/GI for evaluation, considering she is a former smoker.  prilosec 40 mg until Dec. 8, Start new antihistamine (zyrtec or allegra).

## 2015-09-09 ENCOUNTER — Other Ambulatory Visit: Payer: Self-pay | Admitting: Family Medicine

## 2015-09-09 ENCOUNTER — Telehealth: Payer: Self-pay | Admitting: Family Medicine

## 2015-09-09 ENCOUNTER — Other Ambulatory Visit (INDEPENDENT_AMBULATORY_CARE_PROVIDER_SITE_OTHER): Payer: Commercial Managed Care - HMO

## 2015-09-09 DIAGNOSIS — E785 Hyperlipidemia, unspecified: Secondary | ICD-10-CM

## 2015-09-09 DIAGNOSIS — K588 Other irritable bowel syndrome: Secondary | ICD-10-CM

## 2015-09-09 DIAGNOSIS — I10 Essential (primary) hypertension: Secondary | ICD-10-CM | POA: Diagnosis not present

## 2015-09-09 DIAGNOSIS — E559 Vitamin D deficiency, unspecified: Secondary | ICD-10-CM

## 2015-09-09 LAB — COMPREHENSIVE METABOLIC PANEL
ALT: 15 U/L (ref 0–35)
AST: 17 U/L (ref 0–37)
Albumin: 4.2 g/dL (ref 3.5–5.2)
Alkaline Phosphatase: 65 U/L (ref 39–117)
BUN: 21 mg/dL (ref 6–23)
CHLORIDE: 102 meq/L (ref 96–112)
CO2: 32 meq/L (ref 19–32)
CREATININE: 0.97 mg/dL (ref 0.40–1.20)
Calcium: 9.9 mg/dL (ref 8.4–10.5)
GFR: 60.61 mL/min (ref >60.00)
GLUCOSE: 92 mg/dL (ref 70–99)
Potassium: 4.2 mEq/L (ref 3.5–5.1)
SODIUM: 142 meq/L (ref 135–145)
Total Bilirubin: 0.5 mg/dL (ref 0.2–1.2)
Total Protein: 6.9 g/dL (ref 6.0–8.3)

## 2015-09-09 LAB — CBC WITH DIFFERENTIAL/PLATELET
BASOS ABS: 0 10*3/uL (ref 0.0–0.1)
Basophils Relative: 0.7 % (ref 0.0–3.0)
EOS ABS: 0.2 10*3/uL (ref 0.0–0.7)
Eosinophils Relative: 2.4 % (ref 0.0–5.0)
HCT: 36.3 % (ref 36.0–46.0)
Hemoglobin: 11.8 g/dL — ABNORMAL LOW (ref 12.0–15.0)
LYMPHS ABS: 1.9 10*3/uL (ref 0.7–4.0)
Lymphocytes Relative: 27.5 % (ref 12.0–46.0)
MCHC: 32.4 g/dL (ref 30.0–36.0)
MCV: 88 fl (ref 78.0–100.0)
Monocytes Absolute: 0.5 10*3/uL (ref 0.1–1.0)
Monocytes Relative: 7.3 % (ref 3.0–12.0)
NEUTROS ABS: 4.3 10*3/uL (ref 1.4–7.7)
NEUTROS PCT: 62.1 % (ref 43.0–77.0)
PLATELETS: 276 10*3/uL (ref 150.0–400.0)
RBC: 4.13 Mil/uL (ref 3.87–5.11)
RDW: 13.8 % (ref 11.5–15.5)
WBC: 7 10*3/uL (ref 4.0–10.5)

## 2015-09-09 LAB — TSH: TSH: 2.39 u[IU]/mL (ref 0.35–4.50)

## 2015-09-09 LAB — LIPID PANEL
CHOL/HDL RATIO: 4
Cholesterol: 178 mg/dL (ref 0–200)
HDL: 47.4 mg/dL (ref >39.00)
LDL CALC: 92 mg/dL (ref 0–99)
NONHDL: 131.01
Triglycerides: 195 mg/dL — ABNORMAL HIGH (ref 0.0–149.0)
VLDL: 39 mg/dL (ref 0.0–40.0)

## 2015-09-09 LAB — VITAMIN D 25 HYDROXY (VIT D DEFICIENCY, FRACTURES): VITD: 37.03 ng/mL (ref 30.00–100.00)

## 2015-09-09 LAB — HEMOGLOBIN A1C: Hgb A1c MFr Bld: 5.5 % (ref 4.6–6.5)

## 2015-09-09 LAB — VITAMIN B12: Vitamin B-12: 894 pg/mL (ref 211–911)

## 2015-09-09 NOTE — Telephone Encounter (Signed)
Open in error

## 2015-09-11 ENCOUNTER — Encounter: Payer: Self-pay | Admitting: Family Medicine

## 2015-09-11 ENCOUNTER — Ambulatory Visit (INDEPENDENT_AMBULATORY_CARE_PROVIDER_SITE_OTHER): Payer: Commercial Managed Care - HMO | Admitting: Family Medicine

## 2015-09-11 VITALS — BP 116/79 | HR 75 | Temp 98.1°F | Resp 20 | Ht 65.0 in | Wt 189.5 lb

## 2015-09-11 DIAGNOSIS — E785 Hyperlipidemia, unspecified: Secondary | ICD-10-CM

## 2015-09-11 DIAGNOSIS — M19079 Primary osteoarthritis, unspecified ankle and foot: Secondary | ICD-10-CM

## 2015-09-11 DIAGNOSIS — M19041 Primary osteoarthritis, right hand: Secondary | ICD-10-CM | POA: Diagnosis not present

## 2015-09-11 DIAGNOSIS — E781 Pure hyperglyceridemia: Secondary | ICD-10-CM

## 2015-09-11 DIAGNOSIS — E559 Vitamin D deficiency, unspecified: Secondary | ICD-10-CM

## 2015-09-11 DIAGNOSIS — M8580 Other specified disorders of bone density and structure, unspecified site: Secondary | ICD-10-CM | POA: Diagnosis not present

## 2015-09-11 DIAGNOSIS — M1712 Unilateral primary osteoarthritis, left knee: Secondary | ICD-10-CM | POA: Diagnosis not present

## 2015-09-11 DIAGNOSIS — Z23 Encounter for immunization: Secondary | ICD-10-CM

## 2015-09-11 DIAGNOSIS — I10 Essential (primary) hypertension: Secondary | ICD-10-CM

## 2015-09-11 DIAGNOSIS — Z6831 Body mass index (BMI) 31.0-31.9, adult: Secondary | ICD-10-CM

## 2015-09-11 DIAGNOSIS — M19042 Primary osteoarthritis, left hand: Secondary | ICD-10-CM

## 2015-09-11 DIAGNOSIS — Z0000 Encounter for general adult medical examination without abnormal findings: Secondary | ICD-10-CM

## 2015-09-11 DIAGNOSIS — K588 Other irritable bowel syndrome: Secondary | ICD-10-CM

## 2015-09-11 HISTORY — DX: Morbid (severe) obesity due to excess calories: E66.01

## 2015-09-11 MED ORDER — DICYCLOMINE HCL 10 MG PO CAPS: 10.0000 mg | ORAL_CAPSULE | Freq: Three times a day (TID) | ORAL | Status: DC

## 2015-09-11 MED ORDER — TRAMADOL HCL 50 MG PO TABS: 50.0000 mg | ORAL_TABLET | Freq: Two times a day (BID) | ORAL | Status: DC | PRN

## 2015-09-11 MED ORDER — DULOXETINE HCL 60 MG PO CPEP: ORAL_CAPSULE | ORAL | Status: DC

## 2015-09-11 MED ORDER — VITAMIN D 1000 UNITS PO TABS: 2000.0000 [IU] | ORAL_TABLET | Freq: Every day | ORAL | Status: DC

## 2015-09-11 NOTE — Patient Instructions (Addendum)
Double your Vitamin D dose at home.  We will get your bone density repeat 2017 when you get your mammogram We finished your pneumonia vaccinations today forever! Follow March for anxiety and insomnia.  Your Body mass index is 31, refer below for guidelines. You need to find something that get you at least 150 minutes of good exercise a week. Call the number of the [employer] Program and start for help with weight loss. This would be great to do with your family member.   Health Maintenance, Female Adopting a healthy lifestyle and getting preventive care can go a long way to promote health and wellness. Talk with your health care provider about what schedule of regular examinations is right for you. This is a good chance for you to check in with your provider about disease prevention and staying healthy. In between checkups, there are plenty of things you can do on your own. Experts have done a lot of research about which lifestyle changes and preventive measures are most likely to keep you healthy. Ask your health care provider for more information. WEIGHT AND DIET  Eat a healthy diet  Be sure to include plenty of vegetables, fruits, low-fat dairy products, and lean protein.  Do not eat a lot of foods high in solid fats, added sugars, or salt.  Get regular exercise. This is one of the most important things you can do for your health.  Most adults should exercise for at least 150 minutes each week. The exercise should increase your heart rate and make you sweat (moderate-intensity exercise).  Most adults should also do strengthening exercises at least twice a week. This is in addition to the moderate-intensity exercise.  Maintain a healthy weight  Body mass index (BMI) is a measurement that can be used to identify possible weight problems. It estimates body fat based on height and weight. Your health care provider can help determine your BMI and help you achieve or maintain a healthy  weight.  For females 88 years of age and older:   A BMI below 18.5 is considered underweight.  A BMI of 18.5 to 24.9 is normal.  A BMI of 25 to 29.9 is considered overweight.  A BMI of 30 and above is considered obese.  Watch levels of cholesterol and blood lipids  You should start having your blood tested for lipids and cholesterol at 68 years of age, then have this test every 5 years.  You may need to have your cholesterol levels checked more often if:  Your lipid or cholesterol levels are high.  You are older than 68 years of age.  You are at high risk for heart disease.  CANCER SCREENING   Lung Cancer  Lung cancer screening is recommended for adults 59-25 years old who are at high risk for lung cancer because of a history of smoking.  A yearly low-dose CT scan of the lungs is recommended for people who:  Currently smoke.  Have quit within the past 15 years.  Have at least a 30-pack-year history of smoking. A pack year is smoking an average of one pack of cigarettes a day for 1 year.  Yearly screening should continue until it has been 15 years since you quit.  Yearly screening should stop if you develop a health problem that would prevent you from having lung cancer treatment.  Breast Cancer  Practice breast self-awareness. This means understanding how your breasts normally appear and feel.  It also means doing regular breast self-exams.  Let your health care provider know about any changes, no matter how small.  If you are in your 20s or 30s, you should have a clinical breast exam (CBE) by a health care provider every 1-3 years as part of a regular health exam.  If you are 77 or older, have a CBE every year. Also consider having a breast X-ray (mammogram) every year.  If you have a family history of breast cancer, talk to your health care provider about genetic screening.  If you are at high risk for breast cancer, talk to your health care provider about  having an MRI and a mammogram every year.  Breast cancer gene (BRCA) assessment is recommended for women who have family members with BRCA-related cancers. BRCA-related cancers include:  Breast.  Ovarian.  Tubal.  Peritoneal cancers.  Results of the assessment will determine the need for genetic counseling and BRCA1 and BRCA2 testing. Cervical Cancer Your health care provider may recommend that you be screened regularly for cancer of the pelvic organs (ovaries, uterus, and vagina). This screening involves a pelvic examination, including checking for microscopic changes to the surface of your cervix (Pap test). You may be encouraged to have this screening done every 3 years, beginning at age 82.  For women ages 70-65, health care providers may recommend pelvic exams and Pap testing every 3 years, or they may recommend the Pap and pelvic exam, combined with testing for human papilloma virus (HPV), every 5 years. Some types of HPV increase your risk of cervical cancer. Testing for HPV may also be done on women of any age with unclear Pap test results.  Other health care providers may not recommend any screening for nonpregnant women who are considered low risk for pelvic cancer and who do not have symptoms. Ask your health care provider if a screening pelvic exam is right for you.  If you have had past treatment for cervical cancer or a condition that could lead to cancer, you need Pap tests and screening for cancer for at least 20 years after your treatment. If Pap tests have been discontinued, your risk factors (such as having a new sexual partner) need to be reassessed to determine if screening should resume. Some women have medical problems that increase the chance of getting cervical cancer. In these cases, your health care provider may recommend more frequent screening and Pap tests. Colorectal Cancer  This type of cancer can be detected and often prevented.  Routine colorectal cancer  screening usually begins at 68 years of age and continues through 68 years of age.  Your health care provider may recommend screening at an earlier age if you have risk factors for colon cancer.  Your health care provider may also recommend using home test kits to check for hidden blood in the stool.  A small camera at the end of a tube can be used to examine your colon directly (sigmoidoscopy or colonoscopy). This is done to check for the earliest forms of colorectal cancer.  Routine screening usually begins at age 88.  Direct examination of the colon should be repeated every 5-10 years through 68 years of age. However, you may need to be screened more often if early forms of precancerous polyps or small growths are found. Skin Cancer  Check your skin from head to toe regularly.  Tell your health care provider about any new moles or changes in moles, especially if there is a change in a mole's shape or color.  Also tell  your health care provider if you have a mole that is larger than the size of a pencil eraser.  Always use sunscreen. Apply sunscreen liberally and repeatedly throughout the day.  Protect yourself by wearing long sleeves, pants, a wide-brimmed hat, and sunglasses whenever you are outside. HEART DISEASE, DIABETES, AND HIGH BLOOD PRESSURE   High blood pressure causes heart disease and increases the risk of stroke. High blood pressure is more likely to develop in:  People who have blood pressure in the high end of the normal range (130-139/85-89 mm Hg).  People who are overweight or obese.  People who are African American.  If you are 74-45 years of age, have your blood pressure checked every 3-5 years. If you are 6 years of age or older, have your blood pressure checked every year. You should have your blood pressure measured twice--once when you are at a hospital or clinic, and once when you are not at a hospital or clinic. Record the average of the two measurements.  To check your blood pressure when you are not at a hospital or clinic, you can use:  An automated blood pressure machine at a pharmacy.  A home blood pressure monitor.  If you are between 6 years and 70 years old, ask your health care provider if you should take aspirin to prevent strokes.  Have regular diabetes screenings. This involves taking a blood sample to check your fasting blood sugar level.  If you are at a normal weight and have a low risk for diabetes, have this test once every three years after 68 years of age.  If you are overweight and have a high risk for diabetes, consider being tested at a younger age or more often. PREVENTING INFECTION  Hepatitis B  If you have a higher risk for hepatitis B, you should be screened for this virus. You are considered at high risk for hepatitis B if:  You were born in a country where hepatitis B is common. Ask your health care provider which countries are considered high risk.  Your parents were born in a high-risk country, and you have not been immunized against hepatitis B (hepatitis B vaccine).  You have HIV or AIDS.  You use needles to inject street drugs.  You live with someone who has hepatitis B.  You have had sex with someone who has hepatitis B.  You get hemodialysis treatment.  You take certain medicines for conditions, including cancer, organ transplantation, and autoimmune conditions. Hepatitis C  Blood testing is recommended for:  Everyone born from [birth year] through 1964.  Anyone with known risk factors for hepatitis C. Sexually transmitted infections (STIs)  You should be screened for sexually transmitted infections (STIs) including gonorrhea and chlamydia if:  You are sexually active and are younger than 68 years of age.  You are older than 68 years of age and your health care provider tells you that you are at risk for this type of infection.  Your sexual activity has changed since you were last screened and  you are at an increased risk for chlamydia or gonorrhea. Ask your health care provider if you are at risk.  If you do not have HIV, but are at risk, it may be recommended that you take a prescription medicine daily to prevent HIV infection. This is called pre-exposure prophylaxis (PrEP). You are considered at risk if:  You are sexually active and do not regularly use condoms or know the HIV status of your partner(s).  You take drugs by injection.  You are sexually active with a partner who has HIV. Talk with your health care provider about whether you are at high risk of being infected with HIV. If you choose to begin PrEP, you should first be tested for HIV. You should then be tested every 3 months for as long as you are taking PrEP.  PREGNANCY   If you are premenopausal and you may become pregnant, ask your health care provider about preconception counseling.  If you may become pregnant, take 400 to 800 micrograms (mcg) of folic acid every day.  If you want to prevent pregnancy, talk to your health care provider about birth control (contraception). OSTEOPOROSIS AND MENOPAUSE   Osteoporosis is a disease in which the bones lose minerals and strength with aging. This can result in serious bone fractures. Your risk for osteoporosis can be identified using a bone density scan.  If you are 52 years of age or older, or if you are at risk for osteoporosis and fractures, ask your health care provider if you should be screened.  Ask your health care provider whether you should take a calcium or vitamin D supplement to lower your risk for osteoporosis.  Menopause may have certain physical symptoms and risks.  Hormone replacement therapy may reduce some of these symptoms and risks. Talk to your health care provider about whether hormone replacement therapy is right for you.  HOME CARE INSTRUCTIONS   Schedule regular health, dental, and eye exams.  Stay current with your immunizations.   Do  not use any tobacco products including cigarettes, chewing tobacco, or electronic cigarettes.  If you are pregnant, do not drink alcohol.  If you are breastfeeding, limit how much and how often you drink alcohol.  Limit alcohol intake to no more than 1 drink per day for nonpregnant women. One drink equals 12 ounces of beer, 5 ounces of wine, or 1 ounces of hard liquor.  Do not use street drugs.  Do not share needles.  Ask your health care provider for help if you need support or information about quitting drugs.  Tell your health care provider if you often feel depressed.  Tell your health care provider if you have ever been abused or do not feel safe at home.   This information is not intended to replace advice given to you by your health care provider. Make sure you discuss any questions you have with your health care provider.   Document Released: 04/05/2011 Document Revised: 10/11/2014 Document Reviewed: 08/22/2013 Elsevier Interactive Patient Education [employer].

## 2015-09-11 NOTE — Progress Notes (Signed)
Patient ID: [name] [name], female   DOB: 1946-02-02, 69 y.o.   MRN: [id number]      Patient ID: [name] [name], female    DOB: 01/20/1946, 69 y.o.   MRN: [id number]  Subjective:  [name] [name] is a 69 y.o. female present for annual exam All past medical history, surgical history, allergies, family history, immunizations, medications and social history were updated in the electronic medical record today. All recent labs, ED visits and hospitalizations within the last year were reviewed.   Health maintenance:  Colonoscopy:2007 no polyps, no fhx, 10 year f/u Mammogram: 07/2015, Negative, No fhx, Fibrocystic only.  Cervical cancer screening:hysterectomy.Continue yearly screening.  Immunizations: Flu given 2016, Tdap given 2011, zoster given 2016, PCV13 2015,PPSV23 indicated last 2008 (prior to 66) Infectious disease screening: Hep c completed, HIV not completed DEXA: 02/08/2013 osteopenia, lower Vit D, currently on 1000u/d, rpt BD 2017 Assistive device: None Oxygen use: None Patient has a Dental home. Hospitalizations/ED visits: None  Results for orders placed or performed in visit on 09/09/15 (from the past 72 hour(s))  Vitamin D (25 hydroxy)     Status: None   Collection Time: 09/09/15  8:09 AM  Result Value Ref Range   VITD 37.03 30.00 - 100.00 ng/mL  B12     Status: None   Collection Time: 09/09/15  8:09 AM  Result Value Ref Range   Vitamin B-12 894 211 - 911 pg/mL  Lipid panel     Status: Abnormal   Collection Time: 09/09/15  8:09 AM  Result Value Ref Range   Cholesterol 178 0 - 200 mg/dL    Comment: ATP III Classification       Desirable:  < 200 mg/dL               Borderline High:  200 - 239 mg/dL          High:  > = 240 mg/dL   Triglycerides 195.0 (H) 0.0 - 149.0 mg/dL    Comment: Normal:  <150 mg/dLBorderline High:  150 - 199 mg/dL   HDL 47.40 >39.00 mg/dL   VLDL 39.0 0.0 - 40.0 mg/dL   LDL Cholesterol 92 0 - 99 mg/dL   Total CHOL/HDL Ratio 4     Comment:                 Men          Women1/2 Average Risk     3.4          3.3Average Risk          5.0          4.42X Average Risk          9.6          7.13X Average Risk          15.0          11.0                       NonHDL 131.01     Comment: NOTE:  Non-HDL goal should be 30 mg/dL higher than patient's LDL goal (i.e. LDL goal of < 70 mg/dL, would have non-HDL goal of < 100 mg/dL)  TSH     Status: None   Collection Time: 09/09/15  8:09 AM  Result Value Ref Range   TSH 2.39 0.35 - 4.50 uIU/mL  HgB A1c     Status: None   Collection Time: 09/09/15  8:09 AM  Result Value Ref Range   Hgb A1c MFr Bld 5.5 4.6 - 6.5 %    Comment: Glycemic Control Guidelines for People with Diabetes:Non Diabetic:  <6%Goal of Therapy: <7%Additional Action Suggested:  >8%   CBC w/Diff     Status: Abnormal   Collection Time: 09/09/15  8:09 AM  Result Value Ref Range   WBC 7.0 4.0 - 10.5 K/uL   RBC 4.13 3.87 - 5.11 Mil/uL   Hemoglobin 11.8 (L) 12.0 - 15.0 g/dL   HCT 36.3 36.0 - 46.0 %   MCV 88.0 78.0 - 100.0 fl   MCHC 32.4 30.0 - 36.0 g/dL   RDW 13.8 11.5 - 15.5 %   Platelets 276.0 150.0 - 400.0 K/uL   Neutrophils Relative % 62.1 43.0 - 77.0 %   Lymphocytes Relative 27.5 12.0 - 46.0 %   Monocytes Relative 7.3 3.0 - 12.0 %   Eosinophils Relative 2.4 0.0 - 5.0 %   Basophils Relative 0.7 0.0 - 3.0 %   Neutro Abs 4.3 1.4 - 7.7 K/uL   Lymphs Abs 1.9 0.7 - 4.0 K/uL   Monocytes Absolute 0.5 0.1 - 1.0 K/uL   Eosinophils Absolute 0.2 0.0 - 0.7 K/uL   Basophils Absolute 0.0 0.0 - 0.1 K/uL  Comp Met (CMET)     Status: None   Collection Time: 09/09/15  8:09 AM  Result Value Ref Range   Sodium 142 135 - 145 mEq/L   Potassium 4.2 3.5 - 5.1 mEq/L   Chloride 102 96 - 112 mEq/L   CO2 32 19 - 32 mEq/L   Glucose, Bld 92 70 - 99 mg/dL   BUN 21 6 - 23 mg/dL   Creatinine, Ser 0.97 0.40 - 1.20 mg/dL   Total Bilirubin 0.5 0.2 - 1.2 mg/dL   Alkaline Phosphatase 65 39 - 117 U/L   AST 17 0 - 37 U/L   ALT 15 0 - 35 U/L   Total Protein 6.9 6.0  - 8.3 g/dL   Albumin 4.2 3.5 - 5.2 g/dL   Calcium 9.9 8.4 - 10.5 mg/dL   GFR 60.61 >60.00 mL/min    Current outpatient prescriptions:  .  ALPRAZolam (XANAX) 0.5 MG tablet, TAKE 1/2-1 TABLET BY MOUTH 3 TIMES DAILY AS NEEDED., Disp: , Rfl: 0 .  aspirin 81 MG tablet, Take 81 mg by mouth daily., Disp: , Rfl:  .  atenolol (TENORMIN) 25 MG tablet, Take 1 tablet (25 mg total) by mouth every morning., Disp: 90 tablet, Rfl: 1 .  calcium carbonate (OS-CAL) 600 MG TABS, Take 600 mg by mouth daily., Disp: , Rfl:  .  cholecalciferol (VITAMIN D) 1000 UNITS tablet, Take 1,000 Units by mouth daily. , Disp: , Rfl:  .  Cyanocobalamin (VITAMIN B-12) 1000 MCG SUBL, Place under the tongue daily., Disp: , Rfl:  .  cyclobenzaprine (FLEXERIL) 5 MG tablet, Take 5 mg by mouth 2 (two) times daily at 8 am and 10 pm., Disp: , Rfl:  .  diclofenac sodium (VOLTAREN) 1 % GEL, Apply dime sized amount to finger 2-3 times daily., Disp: 100 g, Rfl: 1 .  dicyclomine (BENTYL) 20 MG tablet, Take 20 mg by mouth as needed., Disp: , Rfl:  .  DULoxetine (CYMBALTA) 30 MG capsule, 30 mg QD for 2 weeks,then 60 mg QD, Disp: 60 capsule, Rfl: 0 .  folic acid (FOLVITE) 814 MCG tablet, Take 400 mcg by mouth daily., Disp: , Rfl:  .  Ginger, Zingiber officinalis, (GINGER ROOT PO), Take by mouth., Disp: ,  Rfl:  .  IRON PO, Take 65 mg by mouth every morning., Disp: , Rfl:  .  lisinopril-hydrochlorothiazide (PRINZIDE,ZESTORETIC) 10-12.5 MG per tablet, Take 1 tablet by mouth daily., Disp: 90 tablet, Rfl: 1 .  Multiple Vitamins-Minerals (WOMENS MULTIVITAMIN PLUS PO), Take 1 tablet by mouth daily., Disp: , Rfl:  .  Omega-3 Fatty Acids (FISH OIL PO), Take by mouth daily., Disp: , Rfl:  .  omeprazole (PRILOSEC) 20 MG capsule, TAKE 1 CAPSULE (20 MG TOTAL) BY MOUTH EVERY MORNING. TAKE 30 MINUTES BEFORE A MEAL, Disp: 90 capsule, Rfl: 0 .  pravastatin (PRAVACHOL) 40 MG tablet, TAKE 1 TABLET (40 MG TOTAL) BY MOUTH DAILY., Disp: 90 tablet, Rfl: 1 .  RESTASIS  0.05 % ophthalmic emulsion, , Disp: , Rfl:  .  sertraline (ZOLOFT) 100 MG tablet, Take 1 tablet (100 mg total) by mouth daily., Disp: 90 tablet, Rfl: 3 .  traMADol (ULTRAM) 50 MG tablet, Take 1 tablet (50 mg total) by mouth 2 (two) times daily as needed., Disp: 45 tablet, Rfl: 1 .  traZODone (DESYREL) 50 MG tablet, Take 1-2 tablets (50-100 mg total) by mouth at bedtime as needed for sleep., Disp: 90 tablet, Rfl: 0 .  Turmeric Curcumin 500 MG CAPS, Take 1,000 mg by mouth., Disp: , Rfl:     Past Medical History  Diagnosis Date  . Unspecified essential hypertension   . Unspecified venous (peripheral) insufficiency   . Irritable bowel syndrome   . Unspecified vitamin D deficiency   . Pernicious anemia   . History of gastritis     2007  . History of duodenal ulcer     2007  . Fibromyalgia   . Anxiety   . Hyperlipidemia   . Chronic cystitis   . Internal and external hemorrhoids without complication   . GERD (gastroesophageal reflux disease)   . PONV (postoperative nausea and vomiting)   . Intrinsic (urethral) sphincter deficiency (ISD)   . Urine incontinence    Allergies  Allergen Reactions  . Morphine Nausea And Vomiting    SEVERE  . Sulfa Antibiotics Swelling   Past Surgical History  Procedure Laterality Date  . Tubal ligation    . Total abdominal hysterectomy  1986  . Anterior cervical decomp/discectomy fusion  1999  . Bilateral salpingoophorectomy  1984  . Dilation and curettage of uterus    . Appendectomy  1984  . Excision right neck and left breast sebaceous cyst  05-18-2002  . Anterior and posterior vaginal repair  04-29-2004    AND TRANSVAGINAL TAPE SLING  . Cardiac catheterization  10-03-2002   DR DEGENT    NORMAL CORONARIES ARTERIES/  EF 55%  . Vaginal prolapse repair N/A 08/27/2013    Procedure: ANTERIOR VAGINAL VAULT SUSPENSION, KELLY PLICATION WITH SACROSPINOUS LIGAMENT FIXATION AND XENFORM BOVINE DERMIS GRAFT AUGMENTATION, URETHRAL EXPLORATION, URETHROLYSIS,  EXPLANTATION OF TVT TAPE, IMPLANTATION OF FLOSEAL, IMPLANTATION OF XENFORM BOVINE GRAFT IN PERIURETHRAL SPACE;  Surgeon: Ailene Rud, MD;  Location: [facility];  Service: Urology;  Laterality: N/A  . Cystoscopy N/A 08/27/2013    Procedure: CYSTOSCOPY;  Surgeon: Ailene Rud, MD;  Location: [facility];  Service: Urology;  Laterality: N/A;  . Cystoscopy with injection N/A 12/17/2013    Procedure: MACROPLASQTIQUE WITH INJECTION;  Surgeon: Ailene Rud, MD;  Location: [facility];  Service: Urology;  Laterality: N/A;  . Hemorrhoid banding     Family History  Problem Relation Age of Onset  . Lung cancer Father   .  Coronary artery disease Father   . Hypertension Mother   . Heart disease Mother   . Myelodysplastic syndrome Mother   . Arthritis Mother   . Fibromyalgia Sister   . Hypertension Brother   . Anemia Brother   . Colon polyps      aunt  . Anemia Maternal Aunt    Social History   Social History  . Marital Status: Divorced    Spouse Name: N/A  . Number of Children: 1  . Years of Education: N/A   Occupational History  . Retired    Social History Main Topics  . Smoking status: Former Smoker -- 1.00 packs/day for 15 years    Types: Cigarettes    Start date: 12/06/1993    Quit date: 08/09/2009  . Smokeless tobacco: Never Used     Comment: quit smoking 5 years ago  . Alcohol Use: Yes     Comment: ocassional  . Drug Use: No  . Sexual Activity: No   Other Topics Concern  . Not on file   Social History Narrative    Objective: BP 116/79 mmHg  Pulse 75  Temp(Src) 98.1 F (36.7 C) (Oral)  Resp 20  Ht _0  (1.651 m)  Wt 189 lb 8 oz (85.957 kg)  BMI 31.53 kg/m2  SpO2 96% Gen: Afebrile. No acute distress. Nontoxic in appearance, well-developed, well-nourished, very pleasant Caucasian female. Overweight. HENT: AT. [place]. Bilateral TM visualized and normal in appearance, normal external auditory canal.  MMM, no oral lesions, good dentition. Bilateral nares without erythema or swelling. Throat without erythema, ulcerations or exudates. No Cough on exam, no hoarseness on exam. Eyes:Pupils Equal Round Reactive to light, Extraocular movements intact,  Conjunctiva without redness, discharge or icterus. Neck/lymp/endocrine: Supple, no lymphadenopathy, no thyromegaly CV: RRR no murmurs/clicks/gallops/rubs, no edema, +2/4 P posterior tibialis pulses. No carotid bruits. No JVD. Chest: CTAB, no wheeze, rhonchi or crackles. Normal Respiratory effort. Good Air movement. Abd: Soft. Round. NTND. BS present. No Masses palpated. No hepatosplenomegaly. No rebound tenderness or guarding. Skin: No rashes, purpura or petechiae. Warm and well-perfused. Skin intact. Neuro/Msk:  Normal gait. PERLA. EOMi. Alert. Oriented x3.  Cranial nerves II through XII intact. Muscle strength 5/5 upper and lower extremity. DTRs equal bilaterally. Psych: Normal affect, dress and demeanor. Normal speech. Normal thought content and judgment.   Assessment/plan: [name] [name] is a 69 y.o. female present for annual exam.   Patient was encouraged to exercise greater than 150 minutes a week. Patient was encouraged to choose a diet filled with fresh fruits and vegetables, and lean meats. AVS provided to patient today for education/recommendation on gender specific health and safety maintenance. BMI 31.0-31.9,adult - Discussed and counseled on exercise and diet. Pt was given information on P.R.E.P program at the [facility] (12 week weight loss program). Pt was interested in joining this program with her daughter.  Osteopenia/Vitamin D deficiency - Vit D 37 - Increase Vit D to 2000iu daily.  - Rpt BD in 2017  Primary osteoarthritis of both hands/Primary osteoarthritis of left knee - Increase pain in left knee and left thumb. Pt encouraged to increase exercise. Discussed future steroid injections vs. Ortho for viscous inj if desires. For now pt  will use pain as her guide, increase activity and use tramadol as needed.  - traMADol (ULTRAM) 50 MG tablet; Take 1 tablet (50 mg total) by mouth 2 (two) times daily as needed.  Dispense: 45 tablet; Refill: 1 - DULoxetine (CYMBALTA) 60 MG capsule; 60 mg QD  Dispense: 90 capsule; Refill: 1  Hyperlipidemia/Hypertriglyceridemia - reviewed labs with pt. Diet and exercise modifications advised.  - Continue pravastatin  Essential hypertension - Stable,well controlled.  - Continue low salt diet. Exercise program.  - Continue statin, ASA, lisinopril/hctz, and atenolol   Other irritable bowel syndrome - refills on bentyl, used lower dose for geriatric pt  Preventive Health encounter/Health maintenance: Colonoscopy:2007 no polyps, no fhx, 10 year f/u Mammogram: 07/2015, Negative, No fhx, Fibrocystic only.  Cervical cancer screening:hysterectomy.Continue with GYN. Immunizations: Flu given 2016, Tdap given 2011, zoster given 2016, PCV13 2015,PPSV23 administered Infectious disease screening: Hep c completed, HIV not completed DEXA: 02/08/2013 osteopenia, lower Vit D, currently on 1000u/d, rpt BD 2017   Howard Pouch, [place]

## 2015-09-18 ENCOUNTER — Other Ambulatory Visit: Payer: Self-pay | Admitting: *Deleted

## 2015-09-18 ENCOUNTER — Ambulatory Visit: Payer: Commercial Managed Care - HMO | Admitting: Family Medicine

## 2015-09-18 DIAGNOSIS — G4700 Insomnia, unspecified: Secondary | ICD-10-CM

## 2015-09-18 MED ORDER — TRAZODONE HCL 50 MG PO TABS: 50.0000 mg | ORAL_TABLET | Freq: Every evening | ORAL | Status: DC | PRN

## 2015-09-18 NOTE — Telephone Encounter (Signed)
Rx sent to [facility]

## 2015-09-18 NOTE — Telephone Encounter (Signed)
Patient requesting trazodone refill. Patient last seen 09/11/2015 last refill 08/22/15 for 30 tabs.

## 2015-11-05 ENCOUNTER — Other Ambulatory Visit: Payer: Self-pay | Admitting: Family Medicine

## 2015-11-21 ENCOUNTER — Ambulatory Visit: Payer: Commercial Managed Care - HMO | Admitting: Family Medicine

## 2015-11-24 ENCOUNTER — Ambulatory Visit (INDEPENDENT_AMBULATORY_CARE_PROVIDER_SITE_OTHER): Payer: Commercial Managed Care - HMO | Admitting: Family Medicine

## 2015-11-24 ENCOUNTER — Encounter: Payer: Self-pay | Admitting: Family Medicine

## 2015-11-24 VITALS — BP 110/76 | HR 71 | Temp 97.9°F | Resp 16 | Ht 65.75 in | Wt 189.8 lb

## 2015-11-24 DIAGNOSIS — M8580 Other specified disorders of bone density and structure, unspecified site: Secondary | ICD-10-CM | POA: Diagnosis not present

## 2015-11-24 DIAGNOSIS — Z0000 Encounter for general adult medical examination without abnormal findings: Secondary | ICD-10-CM | POA: Diagnosis not present

## 2015-11-24 DIAGNOSIS — Z1211 Encounter for screening for malignant neoplasm of colon: Secondary | ICD-10-CM | POA: Diagnosis not present

## 2015-11-24 HISTORY — DX: Encounter for general adult medical examination without abnormal findings: Z00.00

## 2015-11-24 NOTE — Patient Instructions (Signed)
  [name] [name] , Thank you for taking time to come for your Medicare Wellness Visit. I appreciate your ongoing commitment to your health goals. Please review the following plan we discussed and let me know if I can assist you in the future.   These are the goals we discussed: Goals    None      This is a list of the screening recommended for you and due dates:  Health Maintenance  Topic Date Due  . DEXA scan (bone density measurement)  02/09/2016  . Colon Cancer Screening  03/04/2016  . Flu Shot  05/04/2016  . Mammogram  07/22/2017  . Tetanus Vaccine  03/19/2020  . Shingles Vaccine  Completed  .  Hepatitis C: One time screening is recommended by Center for Disease Control  (CDC) for  adults born from [birth year] through 1964.   Completed  . Pneumonia vaccines  Completed

## 2015-11-24 NOTE — Progress Notes (Signed)
Pre visit review using our clinic review tool, if applicable. No additional management support is needed unless otherwise documented below in the visit note. 

## 2015-11-24 NOTE — Progress Notes (Signed)
Patient ID: [name] [name], female   DOB: [birth year], 70 y.o.   MRN: [id number]  Medicare AWV  Date of Visit: 11/24/2015 Physician: Dr. Raoul Pitch, DO List of providers/suppliers:  See care plan  History of Present Ilness: [name] [name], 70 y.o. , female presents today for Medicare-IPPE.  Other Complaints: Chief Complaint  Patient presents with  . Medicare Wellness    Pt is fasting.     Review of Systems: per HPI, otherwise negative  Physicians providing care to the patient:  Dr. Bing Plume- Opth 2016 Dr. Carlean Purl - GI Dr. Antoine Primas - Urology  Past medical, surgical, family and social histories reviewed (including experiences with illnesses, hospital stays, operations, injuries, and treatments):  Past Medical History  Diagnosis Date  . Unspecified essential hypertension   . Unspecified venous (peripheral) insufficiency   . Irritable bowel syndrome   . Unspecified vitamin D deficiency   . Pernicious anemia   . History of gastritis     2007  . History of duodenal ulcer     2007  . Fibromyalgia   . Anxiety   . Hyperlipidemia   . Chronic cystitis   . Internal and external hemorrhoids without complication   . GERD (gastroesophageal reflux disease)   . PONV (postoperative nausea and vomiting)   . Intrinsic (urethral) sphincter deficiency (ISD)   . Urine incontinence    Past Surgical History  Procedure Laterality Date  . Tubal ligation    . Total abdominal hysterectomy  1986  . Anterior cervical decomp/discectomy fusion  1999  . Bilateral salpingoophorectomy  1984  . Dilation and curettage of uterus    . Appendectomy  1984  . Excision right neck and left breast sebaceous cyst  05-18-2002  . Anterior and posterior vaginal repair  04-29-2004    AND TRANSVAGINAL TAPE SLING  . Cardiac catheterization  10-03-2002   DR DEGENT    NORMAL CORONARIES ARTERIES/  EF 55%  . Vaginal prolapse repair N/A 08/27/2013    Procedure: ANTERIOR VAGINAL VAULT SUSPENSION, KELLY PLICATION WITH  SACROSPINOUS LIGAMENT FIXATION AND XENFORM BOVINE DERMIS GRAFT AUGMENTATION, URETHRAL EXPLORATION, URETHROLYSIS, EXPLANTATION OF TVT TAPE, IMPLANTATION OF FLOSEAL, IMPLANTATION OF XENFORM BOVINE GRAFT IN PERIURETHRAL SPACE;  Surgeon: Ailene Rud, MD;  Location: [place];  Service: Urology;  Laterality: N/A  . Cystoscopy N/A 08/27/2013    Procedure: CYSTOSCOPY;  Surgeon: Ailene Rud, MD;  Location: [facility];  Service: Urology;  Laterality: N/A;  . Cystoscopy with injection N/A 12/17/2013    Procedure: MACROPLASQTIQUE WITH INJECTION;  Surgeon: Ailene Rud, MD;  Location: [facility];  Service: Urology;  Laterality: N/A;  . Hemorrhoid banding      Family History  Problem Relation Age of Onset  . Lung cancer Father   . Coronary artery disease Father   . Hypertension Mother   . Heart disease Mother   . Myelodysplastic syndrome Mother   . Arthritis Mother   . Fibromyalgia Sister   . Hypertension Brother   . Anemia Brother   . Colon polyps      aunt  . Anemia Maternal Aunt    Social History   Social History  . Marital Status: Divorced    Spouse Name: N/A  . Number of Children: 1  . Years of Education: N/A   Occupational History  . Retired    Social History Main Topics  . Smoking status: Former Smoker -- 1.00 packs/day for 15 years    Types: Cigarettes  Start date: 12/06/1993    Quit date: 08/09/2009  . Smokeless tobacco: Never Used     Comment: quit smoking 5 years ago  . Alcohol Use: Yes     Comment: ocassional  . Drug Use: No  . Sexual Activity: No   Other Topics Concern  . Not on file   Social History Narrative    Medications and Allergies reviewed:  Current outpatient prescriptions:  .  ALPRAZolam (XANAX) 0.5 MG tablet, TAKE 1/2-1 TABLET BY MOUTH 3 TIMES DAILY AS NEEDED., Disp: , Rfl: 0 .  aspirin 81 MG tablet, Take 81 mg by mouth daily., Disp: , Rfl:  .  atenolol (TENORMIN) 25 MG  tablet, Take 1 tablet (25 mg total) by mouth every morning., Disp: 90 tablet, Rfl: 1 .  calcium carbonate (OS-CAL) 600 MG TABS, Take 600 mg by mouth daily., Disp: , Rfl:  .  cholecalciferol (VITAMIN D) 1000 UNITS tablet, Take 2 tablets (2,000 Units total) by mouth daily., Disp: 60 tablet, Rfl: 11 .  Cyanocobalamin (VITAMIN B-12) 1000 MCG SUBL, Place under the tongue daily., Disp: , Rfl:  .  cyclobenzaprine (FLEXERIL) 5 MG tablet, Take 5 mg by mouth 2 (two) times daily at 8 am and 10 pm., Disp: , Rfl:  .  diclofenac sodium (VOLTAREN) 1 % GEL, Apply dime sized amount to finger 2-3 times daily., Disp: 100 g, Rfl: 1 .  dicyclomine (BENTYL) 10 MG capsule, Take 1 capsule (10 mg total) by mouth 4 (four) times daily -  before meals and at bedtime., Disp: 120 capsule, Rfl: 1 .  DULoxetine (CYMBALTA) 60 MG capsule, 60 mg QD, Disp: 90 capsule, Rfl: 1 .  folic acid (FOLVITE) Q000111Q MCG tablet, Take 400 mcg by mouth daily., Disp: , Rfl:  .  Ginger, Zingiber officinalis, (GINGER ROOT PO), Take by mouth., Disp: , Rfl:  .  IRON PO, Take 65 mg by mouth every morning., Disp: , Rfl:  .  lisinopril-hydrochlorothiazide (PRINZIDE,ZESTORETIC) 10-12.5 MG tablet, TAKE 1 TABLET BY MOUTH DAILY., Disp: 90 tablet, Rfl: 1 .  Multiple Vitamins-Minerals (WOMENS MULTIVITAMIN PLUS PO), Take 1 tablet by mouth daily., Disp: , Rfl:  .  Omega-3 Fatty Acids (FISH OIL PO), Take by mouth daily., Disp: , Rfl:  .  omeprazole (PRILOSEC) 20 MG capsule, TAKE 1 CAPSULE (20 MG TOTAL) BY MOUTH EVERY MORNING. TAKE 30 MINUTES BEFORE A MEAL, Disp: 90 capsule, Rfl: 0 .  pravastatin (PRAVACHOL) 40 MG tablet, TAKE 1 TABLET (40 MG TOTAL) BY MOUTH DAILY., Disp: 90 tablet, Rfl: 1 .  RESTASIS 0.05 % ophthalmic emulsion, , Disp: , Rfl:  .  sertraline (ZOLOFT) 100 MG tablet, Take 1 tablet (100 mg total) by mouth daily., Disp: 90 tablet, Rfl: 3 .  traMADol (ULTRAM) 50 MG tablet, Take 1 tablet (50 mg total) by mouth 2 (two) times daily as needed., Disp: 45  tablet, Rfl: 1 .  traZODone (DESYREL) 50 MG tablet, Take 1-2 tablets (50-100 mg total) by mouth at bedtime as needed for sleep., Disp: 60 tablet, Rfl: 2 .  Turmeric Curcumin 500 MG CAPS, Take 1,000 mg by mouth., Disp: , Rfl:    Allergies  Allergen Reactions  . Morphine Nausea And Vomiting    SEVERE  . Sulfa Antibiotics Swelling    Diet reviewed: Diet Plan: low salt, otherwise normal Diet.  Dietary supplements: Vit D, 123456, Ca, folic acid, Ginger, iron, turmeric   Activity level and Functional status:  Mobility/Ambulation: Emulates independently without assistance  Transfers: No difficulties with transfers Eating:  No dysphagia Bladder: Urinary incontinence present, she follows with urology Bowel: No bowel incontinence   ADLs / IADLs:  No  difficulties performing daily ADLs/IADLs independently.  Cogntive and Mood functions:   Risk Evaluation:  Depression screen [facility] 2/9 11/24/2015 09/11/2015 12/24/2014  Decreased Interest 0 0 0  Down, Depressed, Hopeless 0 0 0  PHQ - 2 Score 0 0 0    Functional Status Survey: Is the patient deaf or have difficulty hearing?: No Does the patient have difficulty seeing, even when wearing glasses/contacts?: No Does the patient have difficulty concentrating, remembering, or making decisions?: No Does the patient have difficulty walking or climbing stairs?: No Does the patient have difficulty dressing or bathing?: No Does the patient have difficulty doing errands alone such as visiting a doctor's office or shopping?: No   Hearing: No hearing loss. No struggle hearing conversation. Observed whisper, without difficulty today.  Fall Risk  11/24/2015 09/11/2015 12/24/2014 07/08/2014  Falls in the past year? No No No No  Risk for fall due to : History of fall(s) - - -    Advanced Care Planning Code status on file: dicussed with pt today. She wanted to think it over, MOST form explained and provided to her today.  MOST form information and copy given:  provided to pt with complete explantation, she will complete and return  [place] information provided: Don Broach (daughter). This is who she desires, she has not made legal and will have paperwork completed.  Advanced care directive scanned into chart: no --> will need to provide to pt once packets are available.   Advanced Care Planning - (Please have patient sign the Advanced Care Planning letter and scan to chart) ( MOST or DNR forms): Will do  Preventive Screen   Immunizations Immunization History  Administered Date(s) Administered  . Influenza Split 07/02/2011  . Influenza Whole 07/16/2008, 07/04/2009, 07/14/2010  . Influenza,inj,Quad PF,36+ Mos 06/29/2013, 07/08/2014  . Influenza-Unspecified 07/02/2015  . Pneumococcal Conjugate-13 05/15/2014  . Pneumococcal Polysaccharide-23 10/04/2006, 09/11/2015  . Td 03/19/2010  . Zoster 07/25/2015    Info -Pneumococcal (once in a lifetime after age 51); Seasonal Influenza annually; Tetanus ( Tdap ) once every 10 years; Zostavax - Shingles vaccine - not covered by Part A or B -Medicare covers all Medicare patients -May provide additional pneumococcal vaccinations based on risk and provided that at least 5 yrs have passed since previous dose  Bone mass measurements Date of Study:  Indicated follow up study, last 2014, with osteoporosis   Info:  USPSTF: "B" recommendation to screen, >2 yr interval advised. Coverage: Medicare patients at risk for developing Osteoporosis. Medicare covers every 24 months or based on previous test.   Cardiovascular Screening Blood Tests Lipid Panel     Component Value Date/Time   CHOL 178 09/09/2015 0809   TRIG 195.0* 09/09/2015 0809   HDL 47.40 09/09/2015 0809   CHOLHDL 4 09/09/2015 0809   VLDL 39.0 09/09/2015 0809   LDLCALC 92 09/09/2015 0809     Info:  USPSTF: "C" recommendation for no screening unless patient has risk factors, "A" recommendation to screen if has risk  factors: HTN, DM, ASCVD, Fam Hx of ASCVD in men <50, women <60, Tobacco use, BMI > 30. USPSTF prefers Total Cholesterol and HDL for screening. USPSTF advises every 5 years Coverage: All asymptomic Medicare patients (No fast required for total and HDL measurement. 12-hr fast is required if lipid panel done)  Diabetes Screening Tests:  Not indicated, a1c 5.5  09/09/2015  Info: (Up to 10 hrs of initial training within a continuous 19mo period; subsequent yrs up to 2hrs follow-up training each year after initial year) ;  Coverage: Medicare patients at risk for complications from diabetes, recently diagnosed with diabetes or previously diagnosed with diabetes (must certify DSMT need)  Alcoholo abuse screening CAGE questionnaire: Negative  Alcohol History: No history  STIs screening Number of sexual partners: Low risk History of STIs: No   Colorectal Cancer Screening Last Colonoscopy: Last colonoscopy 2007, colonoscopy referral indicated. Done by: Dr. Carlean Purl  Info USPSTF: "A" for CRC screening, ages 38-74;  3 screening methods: USPSTF says no method is preferred - Annual high-sensitivity FOBT OR - Flex sig Q 5 y + annual FOBT OR - Colonoscopy Q 10 y;  Medicare covers:  -Flexible sigmoidoscopy (47yrs, or once every 10 yrs after a screening colonoscopy) -Screening Colonoscopy (every 5 yrs if at high risk; every 10 yrs otherwise) -Fecal Occult Blood Test (annually) -Barium enema (possible option);  Coverage: Medicare covers for patients age 26 and up - Screening colonoscopy: For those at high risk; no minimum age - No minimum age for having a barium enema as an alternative to a high risk screening colonoscopy if the the patient is at high risk  Glaucoma Screening  Date of Last ophthalmology evaluation: 2016 Done by: Bing Plume  Info USPSTF: "I" recommendation - Insufficent evidence to recommend for or against screening (Medicare coveres annually for patients in a high risk group);   Coverage: Medicare covers for patients with diabetes mellitus, family history of glaucoma, African-Americans age 29 and over, or 2 age 5 and up  Screening Pap tests and Pelvic Exam Last Pap and Pelvic exam: Pap not indicated, patient with hysterectomy greater than 54 years old.  Info:  USPSTF: "D" recommendation against cervical cancer screening for women > age 39 with adequate screening history not at high risk for cervical cancer.  (Medicare covers annually if high-risk, or childbearing age with abnormal Pap test within past 3 yrs; every 24 months for all other women); Medicare covers for all female Medicare patients  Screening Mammography Indicated October 2017, currently patient up-to-date with mammogram  Info:  USPSTF "B" recommendation for mammography every 2 years ages 34-74;  (Medicare covers annually); Medicare covers for all female patients 43 or older  AAA Screening Referred for US examination--> not indicated  Visual Acuity Screen:  Snellen's: Scheduled appointment with ophthalmologist  Physical Examination Findings BP 110/76 mmHg  Pulse 71  Temp(Src) 97.9 F (36.6 C) (Oral)  Resp 16  Ht 5' 5.75" (1.67 m)  Wt 189 lb 12 oz (86.07 kg)  BMI 30.86 kg/m2  SpO2 96% Body mass index is 30.86 kg/(m^2). Gen: Afebrile. No acute distress. Nontoxic in appearance, well-developed, well-nourished, Caucasian female HENT: AT. [place]. . MMM.  Eyes:Pupils Equal Round Reactive to light, Extraocular movements intact,  Conjunctiva without redness, discharge or icterus.  Neuro: Normal gait. PERLA. EOMi. Alert. Oriented x3  Psych: Normal affect, dress and demeanor. Normal speech. Normal thought content and judgment..    I reviewed Point of Care labs: No results found for this or any previous visit (from the past 24 hour(s)).  Labs: Lab on 09/09/2015  Component Date Value  . VITD 09/09/2015 37.03   . Vitamin B-12 09/09/2015 894   . Cholesterol 09/09/2015 178   .  Triglycerides 09/09/2015 195.0*  . HDL 09/09/2015 47.40   . VLDL 09/09/2015 39.0   . LDL Cholesterol 09/09/2015 92   . Total CHOL/HDL Ratio  09/09/2015 4   . NonHDL 09/09/2015 131.01   . TSH 09/09/2015 2.39   . Hgb A1c MFr Bld 09/09/2015 5.5   . WBC 09/09/2015 7.0   . RBC 09/09/2015 4.13   . Hemoglobin 09/09/2015 11.8*  . HCT 09/09/2015 36.3   . MCV 09/09/2015 88.0   . MCHC 09/09/2015 32.4   . RDW 09/09/2015 13.8   . Platelets 09/09/2015 276.0   . Neutrophils Relative % 09/09/2015 62.1   . Lymphocytes Relative 09/09/2015 27.5   . Monocytes Relative 09/09/2015 7.3   . Eosinophils Relative 09/09/2015 2.4   . Basophils Relative 09/09/2015 0.7   . Neutro Abs 09/09/2015 4.3   . Lymphs Abs 09/09/2015 1.9   . Monocytes Absolute 09/09/2015 0.5   . Eosinophils Absolute 09/09/2015 0.2   . Basophils Absolute 09/09/2015 0.0   . Sodium 09/09/2015 142   . Potassium 09/09/2015 4.2   . Chloride 09/09/2015 102   . CO2 09/09/2015 32   . Glucose, Bld 09/09/2015 92   . BUN 09/09/2015 21   . Creatinine, Ser 09/09/2015 0.97   . Total Bilirubin 09/09/2015 0.5   . Alkaline Phosphatase 09/09/2015 65   . AST 09/09/2015 17   . ALT 09/09/2015 15   . Total Protein 09/09/2015 6.9   . Albumin 09/09/2015 4.2   . Calcium 09/09/2015 9.9   . GFR 09/09/2015 60.61      Assessment and Plan: There are no diagnoses linked to this encounter.  - Colonoscopy referral placed today. DEXA referral placed today. - Patient was given MOST form to complete and return to the office. - Patient will be mailed an advanced directives packet once one is available. - Patient encouraged to schedule her glaucoma and annual eye exam last screening January 2016. ([place])   Education and Counseling Provided if applied to pt:  See after visit summary that was printed and given to patient 1. Tobacco abuse counseling 2. Alcohol abuse counseling 3. STIs counseling 4. Referrals made  Referrals made if applied to pt: 1. IBT  (Intensive Behavior Therapy) for Cardiovascular disease 2. IBT for Obesity 3. MNT (Medical Nutrition Therapy)  4. Behavioral Counseling for Alcohol abuse  5. HIBT (High Intensity Behavioral Counseling) to prevent STIs (Sexually Transmitted Infections) 6. [place] for AAA screening  Vaccinations Administered if indicated and pt amendable: 1. Influenza 2. PPV 3. Hepatitis B 4. Tdap   F/U yearly for Medicare wellness

## 2015-12-10 ENCOUNTER — Other Ambulatory Visit: Payer: Self-pay

## 2015-12-10 ENCOUNTER — Ambulatory Visit (INDEPENDENT_AMBULATORY_CARE_PROVIDER_SITE_OTHER): Payer: Commercial Managed Care - HMO | Admitting: Family Medicine

## 2015-12-10 ENCOUNTER — Encounter: Payer: Self-pay | Admitting: Family Medicine

## 2015-12-10 VITALS — BP 110/75 | HR 76 | Temp 98.1°F | Resp 20 | Wt 193.5 lb

## 2015-12-10 DIAGNOSIS — G4700 Insomnia, unspecified: Secondary | ICD-10-CM | POA: Diagnosis not present

## 2015-12-10 DIAGNOSIS — F411 Generalized anxiety disorder: Secondary | ICD-10-CM

## 2015-12-10 DIAGNOSIS — Z1211 Encounter for screening for malignant neoplasm of colon: Secondary | ICD-10-CM

## 2015-12-10 DIAGNOSIS — M19041 Primary osteoarthritis, right hand: Secondary | ICD-10-CM | POA: Diagnosis not present

## 2015-12-10 DIAGNOSIS — K588 Other irritable bowel syndrome: Secondary | ICD-10-CM

## 2015-12-10 DIAGNOSIS — M19042 Primary osteoarthritis, left hand: Secondary | ICD-10-CM

## 2015-12-10 DIAGNOSIS — Z1231 Encounter for screening mammogram for malignant neoplasm of breast: Secondary | ICD-10-CM

## 2015-12-10 MED ORDER — TRAZODONE HCL 100 MG PO TABS: 100.0000 mg | ORAL_TABLET | Freq: Every day | ORAL | Status: DC

## 2015-12-10 MED ORDER — DULOXETINE HCL 60 MG PO CPEP: ORAL_CAPSULE | ORAL | Status: DC

## 2015-12-10 NOTE — Patient Instructions (Addendum)
Trazodone 100 mg before bed.  Cymbalta 60 mg twice a day.  Referral for your colonoscopy placed today. Take you Bentyl for stomach spasms/IBS you can take up to 4 times a day   Irritable Bowel Syndrome, Adult Irritable bowel syndrome (IBS) is not one specific disease. It is a group of symptoms that affects the organs responsible for digestion (gastrointestinal or GI tract).  To regulate how your GI tract works, your body sends signals back and forth between your intestines and your brain. If you have IBS, there may be a problem with these signals. As a result, your GI tract does not function normally. Your intestines may become more sensitive and overreact to certain things. This is especially true when you eat certain foods or when you are under stress.  There are four types of IBS. These may be determined based on the consistency of your stool:   IBS with diarrhea.   IBS with constipation.   Mixed IBS.   Unsubtyped IBS.  It is important to know which type of IBS you have. Some treatments are more likely to be helpful for certain types of IBS.  CAUSES  The exact cause of IBS is not known. RISK FACTORS You may have a higher risk of IBS if:  You are a woman.  You are younger than 69 years old.  You have a family history of IBS.  You have mental health problems.  You have had bacterial infection of your GI tract. SIGNS AND SYMPTOMS  Symptoms of IBS vary from person to person. The main symptom is abdominal pain or discomfort. Additional symptoms usually include one or more of the following:   Diarrhea, constipation, or both.   Abdominal swelling or bloating.   Feeling full or sick after eating a small or regular-size meal.   Frequent gas.   Mucus in the stool.   A feeling of having more stool left after a bowel movement.  Symptoms tend to come and go. They may be associated with stress, psychiatric conditions, or nothing at all.  DIAGNOSIS  There is no specific  test to diagnose IBS. Your health care provider will make a diagnosis based on a physical exam, medical history, and your symptoms. You may have other tests to rule out other conditions that may be causing your symptoms. These may include:   Blood tests.   X-rays.   CT scan.  Endoscopy and colonoscopy. This is a test in which your GI tract is viewed with a long, thin, flexible tube. TREATMENT There is no cure for IBS, but treatment can help relieve symptoms. IBS treatment often includes:   Changes to your diet, such as:  Eating more fiber.  Avoiding foods that cause symptoms.  Drinking more water.  Eating regular, medium-sized portioned meals.  Medicines. These may include:  Fiber supplements if you have constipation.  Medicine to control diarrhea (antidiarrheal medicines).  Medicine to help control muscle spasms in your GI tract (antispasmodic medicines).  Medicines to help with any mental health issues, such as antidepressants or tranquilizers.  Therapy.  Talk therapy may help with anxiety, depression, or other mental health issues that can make IBS symptoms worse.  Stress reduction.  Managing your stress can help keep symptoms under control. HOME CARE INSTRUCTIONS   Take medicines only as directed by your health care provider.  Eat a healthy diet.  Avoid foods and drinks with added sugar.  Include more whole grains, fruits, and vegetables gradually into your diet. This may  be especially helpful if you have IBS with constipation.  Avoid any foods and drinks that make your symptoms worse. These may include dairy products and caffeinated or carbonated drinks.  Do not eat large meals.  Drink enough fluid to keep your urine clear or pale yellow.  Exercise regularly. Ask your health care provider for recommendations of good activities for you.  Keep all follow-up visits as directed by your health care provider. This is important. SEEK MEDICAL CARE IF:   You  have constant pain.  You have trouble or pain with swallowing.  You have worsening diarrhea. SEEK IMMEDIATE MEDICAL CARE IF:   You have severe and worsening abdominal pain.   You have diarrhea and:   You have a rash, stiff neck, or severe headache.   You are irritable, sleepy, or difficult to awaken.   You are weak, dizzy, or extremely thirsty.   You have bright red blood in your stool or you have black tarry stools.   You have unusual abdominal swelling that is painful.   You vomit continuously.   You vomit blood (hematemesis).   You have both abdominal pain and a fever.    This information is not intended to replace advice given to you by your health care provider. Make sure you discuss any questions you have with your health care provider.   Document Released: 09/20/2005 Document Revised: 10/11/2014 Document Reviewed: 06/07/2014 Elsevier Interactive Patient Education [employer].

## 2015-12-10 NOTE — Progress Notes (Signed)
Patient ID: [name] [name], female   DOB: 1946/01/28, 70 y.o.   MRN: [id number]   Subjective:    Patient ID: [name] [name], female    DOB: [birth year], 70 y.o.   MRN: [id number]  HPI  Anxiety:  Patient has continued taking Zoloft 100 mg daily. 6 months ago he had 60 mg Cymbalta, which initially he found improvement however she feels she could use more coverage now. She also complains of increased arthritis pain in multiple joints. She denies any negative side effects from this medication.   Insomnia:  Patient has been completely removed from Xanax and Tylenol PM, which she was taken for her insomnia. She was started on trazodone, which she initially benefit, but states that she now is having difficulty with sleep. She is uncertain if she's taking 1 or 2 pills the trazodone, and believe she is taking 1 pill which would equal 50 mg trazodone. Discussed sleep hygiene with her again today.  IBS: Patient continues complain of fleeting abdominal spasms. She is due for her  Colonoscopy,l last colonoscopy 2007, with 10 year follow-up recommended.. She has history of irritable bowel disease. She is prescribed Bentyl 4 times a day, she states she only takes intermittently maybe 1 or 2 times a day when needed.   Former Smoker  Past Medical History  Diagnosis Date  . Unspecified essential hypertension   . Unspecified venous (peripheral) insufficiency   . Irritable bowel syndrome   . Unspecified vitamin D deficiency   . Pernicious anemia   . History of gastritis     2007  . History of duodenal ulcer     2007  . Fibromyalgia   . Anxiety   . Hyperlipidemia   . Chronic cystitis   . Internal and external hemorrhoids without complication   . GERD (gastroesophageal reflux disease)   . PONV (postoperative nausea and vomiting)   . Intrinsic (urethral) sphincter deficiency (ISD)   . Urine incontinence    Social History   Social History  . Marital Status: Divorced    Spouse Name: N/A  . Number of  Children: 1  . Years of Education: N/A   Occupational History  . Retired    Social History Main Topics  . Smoking status: Former Smoker -- 1.00 packs/day for 15 years    Types: Cigarettes    Start date: 12/06/1993    Quit date: 08/09/2009  . Smokeless tobacco: Never Used     Comment: quit smoking 5 years ago  . Alcohol Use: Yes     Comment: ocassional  . Drug Use: No  . Sexual Activity: No   Other Topics Concern  . Not on file   Social History Narrative    Review of Systems Negative, with the exception of above mentioned in HPI     Objective:   Physical Exam There were no vitals taken for this visit. Gen: Afebrile. No acute distress.  Nontoxic in appearance, well-developed, well-nourished, Caucasian female. HENT: AT. [place].. MMM. Throat without erythema or exudates.  Mild hoarseness on exam. No cough on exam. Eyes:Pupils Equal Round Reactive to light, Extraocular movements intact,  Conjunctiva without redness, discharge or icterus. Neck/lymp/endocrine: Supple, nolymphadenopathy Psych: Normal affect, dress and demeanor. Normal speech. Normal thought content and judgment.     Assessment & Plan:  1. Insomnia - Trazodone increase to 100 mg QHS. Refills Provided today on increased dose. - Improved, but needs more coverage.   2. Depression with anxiety - Improved on cymbalta, but needs more  coverage. Increase Cymbalta to  60 mg BID. Refills provided today for increased dose. - Continue zoloft  3. IBS: bentyl QID, only taking intermittently currently. Patient is in need of her colonoscopy last colonoscopy 2007. Referral placed today for colonoscopy.  Follow-up 3 months.

## 2016-01-05 ENCOUNTER — Encounter: Payer: Self-pay | Admitting: Internal Medicine

## 2016-01-07 ENCOUNTER — Other Ambulatory Visit: Payer: Self-pay | Admitting: Family Medicine

## 2016-01-07 DIAGNOSIS — H43813 Vitreous degeneration, bilateral: Secondary | ICD-10-CM | POA: Diagnosis not present

## 2016-01-07 DIAGNOSIS — H524 Presbyopia: Secondary | ICD-10-CM | POA: Diagnosis not present

## 2016-01-07 DIAGNOSIS — H52213 Irregular astigmatism, bilateral: Secondary | ICD-10-CM | POA: Diagnosis not present

## 2016-01-07 DIAGNOSIS — H26491 Other secondary cataract, right eye: Secondary | ICD-10-CM | POA: Diagnosis not present

## 2016-01-07 DIAGNOSIS — H04123 Dry eye syndrome of bilateral lacrimal glands: Secondary | ICD-10-CM | POA: Diagnosis not present

## 2016-01-26 ENCOUNTER — Telehealth: Payer: Self-pay | Admitting: Family Medicine

## 2016-01-26 MED ORDER — DICYCLOMINE HCL 10 MG PO CAPS: 10.0000 mg | ORAL_CAPSULE | Freq: Three times a day (TID) | ORAL | Status: DC

## 2016-01-26 NOTE — Telephone Encounter (Signed)
Refill sent.

## 2016-01-26 NOTE — Telephone Encounter (Signed)
patinet would like a refill of Dicyclomine 20 mg  She takes 2 per day. To [place]

## 2016-01-30 ENCOUNTER — Other Ambulatory Visit: Payer: Self-pay | Admitting: Family Medicine

## 2016-01-30 DIAGNOSIS — M19042 Primary osteoarthritis, left hand: Secondary | ICD-10-CM

## 2016-01-30 DIAGNOSIS — M19079 Primary osteoarthritis, unspecified ankle and foot: Secondary | ICD-10-CM

## 2016-01-30 DIAGNOSIS — M19041 Primary osteoarthritis, right hand: Secondary | ICD-10-CM

## 2016-01-30 DIAGNOSIS — M1712 Unilateral primary osteoarthritis, left knee: Secondary | ICD-10-CM

## 2016-01-30 MED ORDER — TRAMADOL HCL 50 MG PO TABS: 50.0000 mg | ORAL_TABLET | Freq: Two times a day (BID) | ORAL | Status: DC | PRN

## 2016-01-30 NOTE — Telephone Encounter (Signed)
Last office visit 12/05/15 last reill on Tramadol 09/2015 45 tabs with 1 refill.

## 2016-01-30 NOTE — Telephone Encounter (Signed)
Tramadol [facility]

## 2016-02-06 DIAGNOSIS — N390 Urinary tract infection, site not specified: Secondary | ICD-10-CM | POA: Diagnosis not present

## 2016-02-06 DIAGNOSIS — N952 Postmenopausal atrophic vaginitis: Secondary | ICD-10-CM | POA: Diagnosis not present

## 2016-02-06 DIAGNOSIS — R300 Dysuria: Secondary | ICD-10-CM | POA: Diagnosis not present

## 2016-02-06 DIAGNOSIS — Z6831 Body mass index (BMI) 31.0-31.9, adult: Secondary | ICD-10-CM | POA: Diagnosis not present

## 2016-02-06 DIAGNOSIS — Z01419 Encounter for gynecological examination (general) (routine) without abnormal findings: Secondary | ICD-10-CM | POA: Diagnosis not present

## 2016-02-09 ENCOUNTER — Other Ambulatory Visit: Payer: Self-pay | Admitting: Family Medicine

## 2016-02-11 ENCOUNTER — Other Ambulatory Visit: Payer: Self-pay | Admitting: Family Medicine

## 2016-02-17 ENCOUNTER — Encounter: Payer: Self-pay | Admitting: Family Medicine

## 2016-02-17 DIAGNOSIS — H5213 Myopia, bilateral: Secondary | ICD-10-CM | POA: Diagnosis not present

## 2016-02-17 DIAGNOSIS — Z0100 Encounter for examination of eyes and vision without abnormal findings: Secondary | ICD-10-CM | POA: Diagnosis not present

## 2016-02-17 DIAGNOSIS — H52209 Unspecified astigmatism, unspecified eye: Secondary | ICD-10-CM | POA: Diagnosis not present

## 2016-02-19 ENCOUNTER — Ambulatory Visit (INDEPENDENT_AMBULATORY_CARE_PROVIDER_SITE_OTHER): Payer: Commercial Managed Care - HMO | Admitting: Family Medicine

## 2016-02-19 ENCOUNTER — Encounter: Payer: Self-pay | Admitting: Family Medicine

## 2016-02-19 VITALS — BP 106/72 | HR 62 | Temp 98.2°F | Resp 20 | Wt 194.0 lb

## 2016-02-19 DIAGNOSIS — J301 Allergic rhinitis due to pollen: Secondary | ICD-10-CM | POA: Diagnosis not present

## 2016-02-19 DIAGNOSIS — J302 Other seasonal allergic rhinitis: Secondary | ICD-10-CM | POA: Diagnosis not present

## 2016-02-19 HISTORY — DX: Other seasonal allergic rhinitis: J30.2

## 2016-02-19 MED ORDER — IPRATROPIUM BROMIDE 0.06 % NA SOLN: 2.0000 | Freq: Four times a day (QID) | NASAL | Status: DC

## 2016-02-19 NOTE — Patient Instructions (Signed)
Allegra/floanse/mucinex start, these are over the counter.  aitrovent start   Hay Fever Hay fever is an allergic reaction to particles in the air. It cannot be passed from person to person. It cannot be cured, but it can be controlled. CAUSES  Hay fever is caused by something that triggers an allergic reaction (allergens). The following are examples of allergens:  Ragweed.  Feathers.  Animal dander.  Grass and tree pollens.  Cigarette smoke.  House dust.  Pollution. SYMPTOMS   Sneezing.  Runny or stuffy nose.  Tearing eyes.  Itchy eyes, nose, mouth, throat, skin, or other area.  Sore throat.  Headache.  Decreased sense of smell or taste. DIAGNOSIS Your caregiver will perform a physical exam and ask questions about the symptoms you are having.Allergy testing may be done to determine exactly what triggers your hay fever.  TREATMENT   Over-the-counter medicines may help symptoms. These include:  Antihistamines.  Decongestants. These may help with nasal congestion.  Your caregiver may prescribe medicines if over-the-counter medicines do not work.  Some people benefit from allergy shots when other medicines are not helpful. HOME CARE INSTRUCTIONS   Avoid the allergen that is causing your symptoms, if possible.  Take all medicine as told by your caregiver. SEEK MEDICAL CARE IF:   You have severe allergy symptoms and your current medicines are not helping.  Your treatment was working at one time, but you are now experiencing symptoms.  You have sinus congestion and pressure.  You develop a fever or headache.  You have thick nasal discharge.  You have asthma and have a worsening cough and wheezing. SEEK IMMEDIATE MEDICAL CARE IF:   You have swelling of your tongue or lips.  You have trouble breathing.  You feel lightheaded or like you are going to faint.  You have cold sweats.  You have a fever.   This information is not intended to replace  advice given to you by your health care provider. Make sure you discuss any questions you have with your health care provider.   Document Released: 09/20/2005 Document Revised: 12/13/2011 Document Reviewed: 04/02/2015 Elsevier Interactive Patient Education [employer].

## 2016-02-19 NOTE — Progress Notes (Signed)
Patient ID: [name] [name], female   DOB: [birth year], 70 y.o.   MRN: [id number]    [name] [name] , 03/24/1946, 70 y.o., female MRN: [id number]  CC: sore throat Subjective: Pt presents for an acute OV with complaints of sore throat of 3 days duration. Associated symptoms include sinus drainage (PND), mild nasal congestion, broke out in a sweat. Pt feels symptoms are worse at night and early morning. She is eating and drinking well. She is taking tylenol and zyrtec.Pt denies itchy watery eyes, rhinorrhea,  worsening headache. No sick contacts. Allergies  Allergen Reactions  . Morphine Nausea And Vomiting    SEVERE  . Sulfa Antibiotics Swelling   Social History  Substance Use Topics  . Smoking status: Former Smoker -- 1.00 packs/day for 15 years    Types: Cigarettes    Start date: 12/06/1993    Quit date: 08/09/2009  . Smokeless tobacco: Never Used     Comment: quit smoking 5 years ago  . Alcohol Use: Yes     Comment: ocassional   Past Medical History  Diagnosis Date  . Unspecified essential hypertension   . Unspecified venous (peripheral) insufficiency   . Irritable bowel syndrome   . Unspecified vitamin D deficiency   . Pernicious anemia   . History of gastritis     2007  . History of duodenal ulcer     2007  . Fibromyalgia   . Anxiety   . Hyperlipidemia   . Chronic cystitis   . Internal and external hemorrhoids without complication   . GERD (gastroesophageal reflux disease)   . PONV (postoperative nausea and vomiting)   . Intrinsic (urethral) sphincter deficiency (ISD)   . Urine incontinence    Past Surgical History  Procedure Laterality Date  . Tubal ligation    . Total abdominal hysterectomy  1986  . Anterior cervical decomp/discectomy fusion  1999  . Bilateral salpingoophorectomy  1984  . Dilation and curettage of uterus    . Appendectomy  1984  . Excision right neck and left breast sebaceous cyst  05-18-2002  . Anterior and posterior vaginal repair  04-29-2004      AND TRANSVAGINAL TAPE SLING  . Cardiac catheterization  10-03-2002   DR DEGENT    NORMAL CORONARIES ARTERIES/  EF 55%  . Vaginal prolapse repair N/A 08/27/2013    Procedure: ANTERIOR VAGINAL VAULT SUSPENSION, KELLY PLICATION WITH SACROSPINOUS LIGAMENT FIXATION AND XENFORM BOVINE DERMIS GRAFT AUGMENTATION, URETHRAL EXPLORATION, URETHROLYSIS, EXPLANTATION OF TVT TAPE, IMPLANTATION OF FLOSEAL, IMPLANTATION OF XENFORM BOVINE GRAFT IN PERIURETHRAL SPACE;  Surgeon: Ailene Rud, MD;  Location: [facility];  Service: Urology;  Laterality: N/A  . Cystoscopy N/A 08/27/2013    Procedure: CYSTOSCOPY;  Surgeon: Ailene Rud, MD;  Location: [facility];  Service: Urology;  Laterality: N/A;  . Cystoscopy with injection N/A 12/17/2013    Procedure: MACROPLASQTIQUE WITH INJECTION;  Surgeon: Ailene Rud, MD;  Location: [facility];  Service: Urology;  Laterality: N/A;  . Hemorrhoid banding     Family History  Problem Relation Age of Onset  . Lung cancer Father   . Coronary artery disease Father   . Hypertension Mother   . Heart disease Mother   . Myelodysplastic syndrome Mother   . Arthritis Mother   . Fibromyalgia Sister   . Hypertension Brother   . Anemia Brother   . Colon polyps      aunt  . Anemia Maternal Aunt  Medication List       This list is accurate as of: 02/19/16 11:06 AM.  Always use your most recent med list.               aspirin 81 MG tablet  Take 81 mg by mouth daily.     atenolol 25 MG tablet  Commonly known as:  TENORMIN  TAKE 1 TABLET EVERY MORNING     calcium carbonate 600 MG Tabs tablet  Commonly known as:  OS-CAL  Take 600 mg by mouth daily.     cholecalciferol 1000 units tablet  Commonly known as:  VITAMIN D  Take 2 tablets (2,000 Units total) by mouth daily.     cyclobenzaprine 5 MG tablet  Commonly known as:  FLEXERIL  Take 5 mg by mouth 2 (two) times daily at 8 am and 10 pm.      diclofenac sodium 1 % Gel  Commonly known as:  VOLTAREN  Apply dime sized amount to finger 2-3 times daily.     dicyclomine 10 MG capsule  Commonly known as:  BENTYL  Take 1 capsule (10 mg total) by mouth 4 (four) times daily -  before meals and at bedtime.     DULoxetine 60 MG capsule  Commonly known as:  CYMBALTA  60 mg QD     FISH OIL PO  Take by mouth daily.     folic acid Q000111Q MCG tablet  Commonly known as:  FOLVITE  Take 400 mcg by mouth daily.     GINGER ROOT PO  Take by mouth.     IRON PO  Take 65 mg by mouth every morning.     lisinopril-hydrochlorothiazide 10-12.5 MG tablet  Commonly known as:  PRINZIDE,ZESTORETIC  TAKE 1 TABLET BY MOUTH DAILY.     omeprazole 20 MG capsule  Commonly known as:  PRILOSEC  TAKE 1 CAPSULE EVERY MORNING  30  MINUTES BEFORE A MEAL     pravastatin 40 MG tablet  Commonly known as:  PRAVACHOL  TAKE 1 TABLET (40 MG TOTAL) BY MOUTH DAILY.     RESTASIS 0.05 % ophthalmic emulsion  Generic drug:  cycloSPORINE     sertraline 100 MG tablet  Commonly known as:  ZOLOFT  Take 1 tablet (100 mg total) by mouth daily.     traMADol 50 MG tablet  Commonly known as:  ULTRAM  Take 1 tablet (50 mg total) by mouth 2 (two) times daily as needed.     traZODone 100 MG tablet  Commonly known as:  DESYREL  Take 1 tablet (100 mg total) by mouth at bedtime.     Turmeric Curcumin 500 MG Caps  Take 1,000 mg by mouth.     Vitamin B-12 1000 MCG Subl  Place under the tongue daily.     WOMENS MULTIVITAMIN PLUS PO  Take 1 tablet by mouth daily.     XIIDRA 5 % Soln  Generic drug:  Lifitegrast  INSTILL 1 DROP IN BOTH EYES TWICE A DAY       ROS: Negative, with the exception of above mentioned in HPI  Objective:  BP 106/72 mmHg  Pulse 62  Temp(Src) 98.2 F (36.8 C)  Resp 20  Wt 194 lb (87.998 kg)  SpO2 97% Body mass index is 31.55 kg/(m^2). Gen: Afebrile. No acute distress. Nontoxic in appearance, well developed, well nourished female.  Pleasant.  HENT: AT. [place].  MMM, no oral lesions. Eyes:Pupils Equal Round Reactive to light, Extraocular movements intact,  Conjunctiva without redness, discharge or icterus. Neck/lymp/endocrine: Supple,No lymphadenopathy  CV: RRR  Chest: CTAB, no wheeze or crackles. Good air movement, normal resp effort.  Abd: Soft.  NTND. BS present. Skin: No rashes, purpura or petechiae.  Neuro: Normal gait. PERLA. EOMi. Alert. Oriented x3    Assessment/Plan: [name] [name] is a 70 y.o. female present for acute OV for Sore throat. Seasonal allergies Allergic rhinitis due to pollen - PND present, does not appear infected today - ipratropium (ATROVENT) 0.06 % nasal spray; Place 2 sprays into both nostrils 4 (four) times daily.  Dispense: 15 mL; Refill: 0 - Continue/start: Allegra/floanse/mucinex OTC - AVS education provided to to pt - F/U prn or if worsening after 7-10 days of treatment.    > 25 minutes spent with patient, >50% of time spent face to face counseling patient and coordinating care.  electronically signed by:  Howard Pouch, DO  [place]

## 2016-03-03 ENCOUNTER — Ambulatory Visit (AMBULATORY_SURGERY_CENTER): Payer: Self-pay

## 2016-03-03 VITALS — Ht 66.0 in | Wt 192.8 lb

## 2016-03-03 DIAGNOSIS — Z1211 Encounter for screening for malignant neoplasm of colon: Secondary | ICD-10-CM

## 2016-03-03 NOTE — Progress Notes (Signed)
No allergies to eggs or soy No past problems with anesthesia EXCEPT PONV WITH GENERAL AND DIFFICULTY 'WAKING UP' No diet meds No home oxygen  Has email and internet; declined emmi

## 2016-03-04 ENCOUNTER — Encounter: Payer: Self-pay | Admitting: Internal Medicine

## 2016-03-08 ENCOUNTER — Ambulatory Visit (INDEPENDENT_AMBULATORY_CARE_PROVIDER_SITE_OTHER): Payer: Commercial Managed Care - HMO | Admitting: Family Medicine

## 2016-03-08 ENCOUNTER — Encounter: Payer: Self-pay | Admitting: Family Medicine

## 2016-03-08 VITALS — BP 116/64 | HR 74 | Temp 97.8°F | Resp 12 | Ht 66.0 in | Wt 194.1 lb

## 2016-03-08 DIAGNOSIS — M25562 Pain in left knee: Secondary | ICD-10-CM | POA: Diagnosis not present

## 2016-03-08 DIAGNOSIS — G8929 Other chronic pain: Secondary | ICD-10-CM

## 2016-03-08 NOTE — Patient Instructions (Signed)
A few things to remember from today's visit:   1. Knee pain, chronic, left  - Ambulatory referral to [place], wt loss, low impact exercise, and ROM exercises. Referral to [facility] in [place] placed.    Tylenol 650- mg 3 times per day. Cymbalta to continue, cautions with other meds you are taking.  Over the counter capsaicin.  Fall precaution.

## 2016-03-08 NOTE — Progress Notes (Signed)
Pre visit review using our clinic review tool, if applicable. No additional management support is needed unless otherwise documented below in the visit note. 

## 2016-03-08 NOTE — Progress Notes (Addendum)
HPI:   [name] is a 70 y.o. female, who is here today complaining of L>R leg pain, knee effusion, she is having it for a while.  Getting worse for the past week or so.  Pain is mainly on posterior aspect of knee, occasionally radiated to calf, no LE erythema or edema noted.    Localization: points to her knees, left mainly. Quality: sharp/"sting" sensation, intermittently. Severity: today "not bad", 7/10. Duration: has been going on for years. Also Hx of back pain and hand OA.   Exacerbated by prolonged rest and walking and alleviated by changing position.  No associated symptoms fever, chills, rash, or fatigue. + Limitation of right knee flexion and extension.  She has a "little" numbness if prolonged sitting, resolved with movement.  She is on Cymbalta for OA and fibromyalgia, started 6 months ago by rhreumatologists, helping some. She has also tried Voltaren gel but cannot afford it. Takes Tramadol but does not seem to be helping lately. Pain is limiting some of her daily activities.  No recent trauma or injury.  No long travel, tobacco use, or taking hormonal therapy. Denies chest pain, dyspnea, palpitation, claudication, or focal weakness.  She has follow up appt  with rheumatologists in 7/17. She has had left knee steroid injections in the past. She is afraid of having a "blood clot."   Review of Systems  Constitutional: Negative for fever, appetite change, fatigue and unexpected weight change.  HENT: Negative for facial swelling, mouth sores and trouble swallowing.   Respiratory: Negative for cough, shortness of breath and wheezing.   Cardiovascular: Negative for chest pain, palpitations and leg swelling.  Gastrointestinal: Negative for nausea, vomiting and abdominal pain.       No changes in bowel habits.  Musculoskeletal: Positive for myalgias, back pain, joint swelling, arthralgias and gait problem. Negative for neck pain.       Hx of  fibromyalgia.  Skin: Negative for color change, rash and wound.  Neurological: Negative for syncope, weakness and headaches.  Psychiatric/Behavioral: Negative for confusion. The patient is nervous/anxious.       Current Outpatient Prescriptions on File Prior to Visit  Medication Sig Dispense Refill  . aspirin 81 MG tablet Take 81 mg by mouth daily.    Marland Kitchen atenolol (TENORMIN) 25 MG tablet TAKE 1 TABLET EVERY MORNING 90 tablet 1  . calcium carbonate (OS-CAL) 600 MG TABS Take 600 mg by mouth daily.    . cholecalciferol (VITAMIN D) 1000 UNITS tablet Take 2 tablets (2,000 Units total) by mouth daily. 60 tablet 11  . Cyanocobalamin (VITAMIN B-12) 1000 MCG SUBL Place under the tongue daily.    . cyclobenzaprine (FLEXERIL) 5 MG tablet Take 5 mg by mouth 2 (two) times daily at 8 am and 10 pm.    . diclofenac sodium (VOLTAREN) 1 % GEL Apply dime sized amount to finger 2-3 times daily. 100 g 1  . dicyclomine (BENTYL) 10 MG capsule Take 1 capsule (10 mg total) by mouth 4 (four) times daily -  before meals and at bedtime. 120 capsule 1  . DULoxetine (CYMBALTA) 60 MG capsule 60 mg QD 967 capsule 2  . folic acid (FOLVITE) 893 MCG tablet Take 400 mcg by mouth daily.    . Ginger, Zingiber officinalis, (GINGER ROOT PO) Take by mouth.    Marland Kitchen ipratropium (ATROVENT) 0.06 % nasal spray Place 2 sprays into both nostrils 4 (four) times daily. 15 mL 0  . IRON PO Take  65 mg by mouth every morning.    Marland Kitchen lisinopril-hydrochlorothiazide (PRINZIDE,ZESTORETIC) 10-12.5 MG tablet TAKE 1 TABLET BY MOUTH DAILY. 90 tablet 1  . Multiple Vitamins-Minerals (WOMENS MULTIVITAMIN PLUS PO) Take 1 tablet by mouth daily.    . Omega-3 Fatty Acids (FISH OIL PO) Take by mouth daily.    Marland Kitchen omeprazole (PRILOSEC) 20 MG capsule TAKE 1 CAPSULE EVERY MORNING  30  MINUTES BEFORE A MEAL 90 capsule 0  . pravastatin (PRAVACHOL) 40 MG tablet TAKE 1 TABLET (40 MG TOTAL) BY MOUTH DAILY. 90 tablet 1  . RESTASIS 0.05 % ophthalmic emulsion     . sertraline  (ZOLOFT) 100 MG tablet Take 1 tablet (100 mg total) by mouth daily. 90 tablet 3  . traMADol (ULTRAM) 50 MG tablet Take 1 tablet (50 mg total) by mouth 2 (two) times daily as needed. 45 tablet 1  . traZODone (DESYREL) 100 MG tablet Take 1 tablet (100 mg total) by mouth at bedtime. 90 tablet 0  . Turmeric Curcumin 500 MG CAPS Take 1,000 mg by mouth.    Marland Kitchen XIIDRA 5 % SOLN INSTILL 1 DROP IN BOTH EYES TWICE A DAY  3   No current facility-administered medications on file prior to visit.     Past Medical History  Diagnosis Date  . Unspecified essential hypertension   . Unspecified venous (peripheral) insufficiency   . Irritable bowel syndrome   . Unspecified vitamin D deficiency   . Pernicious anemia   . History of gastritis     2007  . History of duodenal ulcer     2007  . Fibromyalgia   . Anxiety   . Hyperlipidemia   . Chronic cystitis   . Internal and external hemorrhoids without complication   . GERD (gastroesophageal reflux disease)   . PONV (postoperative nausea and vomiting)   . Intrinsic (urethral) sphincter deficiency (ISD)   . Urine incontinence   . Heart murmur    Allergies  Allergen Reactions  . Morphine Nausea And Vomiting    SEVERE  . Sulfa Antibiotics Swelling    Social History   Social History  . Marital Status: Divorced    Spouse Name: N/A  . Number of Children: 1  . Years of Education: N/A   Occupational History  . Retired    Social History Main Topics  . Smoking status: Former Smoker -- 1.00 packs/day for 15 years    Types: Cigarettes    Start date: 12/06/1993    Quit date: 08/09/2009  . Smokeless tobacco: Never Used     Comment: quit smoking 5 years ago  . Alcohol Use: Yes     Comment: ocassional  . Drug Use: No  . Sexual Activity: No   Other Topics Concern  . None   Social History Narrative    Filed Vitals:   03/08/16 1111  BP: 116/64  Pulse: 74  Temp: 97.8 F (36.6 C)  Resp: 12   Body mass index is 31.34 kg/(m^2).   SpO2  Readings from Last 3 Encounters:  03/08/16 95%  02/19/16 97%  12/10/15 99%     Physical Exam  Constitutional: She is oriented to person, place, and time. She appears well-developed. She does not appear ill. No distress.  HENT:  Head: Atraumatic.  Eyes: Conjunctivae are normal.  Cardiovascular: Normal rate and regular rhythm.   No murmur heard. Pulses:      Dorsalis pedis pulses are 2+ on the right side, and 2+ on the left side.  Posterior tibial pulses are 2+ on the right side, and 2+ on the left side.  Varicose veins, moderate, L>R  Respiratory: Effort normal and breath sounds normal. No respiratory distress.  Musculoskeletal: She exhibits no edema.       Right lower leg: She exhibits no tenderness and no edema.       Left lower leg: She exhibits no tenderness and no edema.  Knee (bilateral): on inspection no, erythema or deformities. Valgus and varus stress normal, McMurray negative, anterior and posterior drawer test negative. No bulged area appreciated upon inspection of posterior aspect of knees.   Right normal ROM, no pain elicited.Crepitus (mild).  Left knee: mild limitation of flexion and extension, pain elicited with full flexion, mild effusion.Pain upon palpation of inter-articular line medial and lateral.   No calves pain with dorsiflexion of foot.   Neurological: She is alert and oriented to person, place, and time.  Skin: Skin is warm. No ecchymosis and no rash noted. No erythema.  Psychiatric: Her speech is normal. Her mood appears anxious.  Well groomed, good eye contact.      ASSESSMENT AND PLAN:     [name] [name] was seen today for leg pain.  Diagnoses and all orders for this visit:  Knee pain, chronic, left -     Ambulatory referral to Orthopedic Surgery  This seems to be chronic and getting worse. Clinical findings and hx provided today suggest knee OA. We discussed natural hx of disease and some treatment options available. Wt loss, [place],OTC  topical Capsaicin, topical Voltaren, Acetaminophen, steroid vs Hyaluronic acid intra-articular injections among some. She would like to discuss Hyaluronic acid in more detail, so referral to [facility] (which she has heard about and would like a referral) placed.   Caution with some medications she is taking, some could cause adverse effects due to med interaction.She is on Trazodone, Tramadol, Flexeril, Cymbalta, and Zoloft. She does not want to try a different pain medication for now.  Reassured about DVT, we discussed common symptoms and clearly instructed about warning signs.  Follow up with rheumatologists in 04/2016 and continue following with PCP.    -Patient advised to return or notify a doctor immediately if symptoms worsen or persist or new concerns arise. She voices understanding.       Betty G. Martinique, MD  [facility]. [place] office.

## 2016-03-10 DIAGNOSIS — M25562 Pain in left knee: Secondary | ICD-10-CM | POA: Diagnosis not present

## 2016-03-17 ENCOUNTER — Encounter: Payer: Self-pay | Admitting: Internal Medicine

## 2016-03-17 ENCOUNTER — Ambulatory Visit (AMBULATORY_SURGERY_CENTER): Payer: Commercial Managed Care - HMO | Admitting: Internal Medicine

## 2016-03-17 VITALS — BP 110/68 | HR 54 | Temp 98.0°F | Resp 10 | Ht 66.0 in | Wt 194.0 lb

## 2016-03-17 DIAGNOSIS — E669 Obesity, unspecified: Secondary | ICD-10-CM | POA: Diagnosis not present

## 2016-03-17 DIAGNOSIS — F341 Dysthymic disorder: Secondary | ICD-10-CM | POA: Diagnosis not present

## 2016-03-17 DIAGNOSIS — Z1211 Encounter for screening for malignant neoplasm of colon: Secondary | ICD-10-CM | POA: Diagnosis not present

## 2016-03-17 DIAGNOSIS — D649 Anemia, unspecified: Secondary | ICD-10-CM | POA: Diagnosis not present

## 2016-03-17 DIAGNOSIS — I10 Essential (primary) hypertension: Secondary | ICD-10-CM | POA: Diagnosis not present

## 2016-03-17 DIAGNOSIS — K649 Unspecified hemorrhoids: Secondary | ICD-10-CM | POA: Diagnosis not present

## 2016-03-17 DIAGNOSIS — K589 Irritable bowel syndrome without diarrhea: Secondary | ICD-10-CM | POA: Diagnosis not present

## 2016-03-17 MED ORDER — SODIUM CHLORIDE 0.9 % IV SOLN: 500.0000 mL | INTRAVENOUS | Status: DC

## 2016-03-17 NOTE — Op Note (Signed)
[place] Patient Name: [name] [name] Procedure Date: 03/17/2016 11:54 AM MRN: [id number] Endoscopist: Gatha Mayer , MD Age: 70 Referring MD:  Date of Birth: 05-20-1946 Gender: Female Procedure:                Colonoscopy Indications:              Screening for colorectal malignant neoplasm Medicines:                Propofol per Anesthesia, Monitored Anesthesia Care Procedure:                Pre-Anesthesia Assessment:                           - Prior to the procedure, a History and Physical                            was performed, and patient medications and                            allergies were reviewed. The patient's tolerance of                            previous anesthesia was also reviewed. The risks                            and benefits of the procedure and the sedation                            options and risks were discussed with the patient.                            All questions were answered, and informed consent                            was obtained. Prior Anticoagulants: The patient has                            taken no previous anticoagulant or antiplatelet                            agents. ASA Grade Assessment: II - A patient with                            mild systemic disease. After reviewing the risks                            and benefits, the patient was deemed in                            satisfactory condition to undergo the procedure.                           After obtaining informed consent, the colonoscope  was passed under direct vision. Throughout the                            procedure, the patient's blood pressure, pulse, and                            oxygen saturations were monitored continuously. The                            Model CF-HQ190L [id number]) scope was introduced                            through the anus and advanced to the the cecum,                            identified by  appendiceal orifice and ileocecal                            valve. The quality of the bowel preparation was                            excellent. The colonoscopy was performed without                            difficulty. The patient tolerated the procedure                            well. The bowel preparation used was Miralax. The                            ileocecal valve, appendiceal orifice, and rectum                            were photographed. Scope In: 12:08:11 PM Scope Out: 12:20:23 PM Scope Withdrawal Time: 0 hours 9 minutes 15 seconds  Total Procedure Duration: 0 hours 12 minutes 12 seconds  Findings:                 The perianal and digital rectal examinations were                            normal.                           The colon (entire examined portion) appeared normal.                           No additional abnormalities were found on                            retroflexion. Complications:            No immediate complications. Estimated blood loss:                            None. Estimated Blood Loss:  Estimated blood loss: none. Impression:               - The entire examined colon is normal.                           - No specimens collected. Recommendation:           - Patient has a contact number available for                            emergencies. The signs and symptoms of potential                            delayed complications were discussed with the                            patient. Return to normal activities tomorrow.                            Written discharge instructions were provided to the                            patient.                           - Resume previous diet.                           - Continue present medications.                           - No repeat colonoscopy due to age.                           - Return to my office at appointment to be                            scheduled.                           - She may  see me for IBS and hemorrhoid symptoms -                            call for appointment Gatha Mayer, MD 03/17/2016 12:35:49 PM This report has been signed electronically.

## 2016-03-17 NOTE — Patient Instructions (Addendum)
Normal exam i.e. No polyps or cancer seen.  You do not need another routine colonoscopy given your age.  You can call and make an appointment to see me about the IBS and hemorrhoids.  I appreciate the opportunity to care for you. Gatha Mayer, MD, FACG  YOU HAD AN ENDOSCOPIC PROCEDURE TODAY AT [facility]:   Refer to the procedure report that was given to you for any specific questions about what was found during the examination.  If the procedure report does not answer your questions, please call your gastroenterologist to clarify.  If you requested that your care partner not be given the details of your procedure findings, then the procedure report has been included in a sealed envelope for you to review at your convenience later.  YOU SHOULD EXPECT: Some feelings of bloating in the abdomen. Passage of more gas than usual.  Walking can help get rid of the air that was put into your GI tract during the procedure and reduce the bloating. If you had a lower endoscopy (such as a colonoscopy or flexible sigmoidoscopy) you may notice spotting of blood in your stool or on the toilet paper. If you underwent a bowel prep for your procedure, you may not have a normal bowel movement for a few days.  Please Note:  You might notice some irritation and congestion in your nose or some drainage.  This is from the oxygen used during your procedure.  There is no need for concern and it should clear up in a day or so.  SYMPTOMS TO REPORT IMMEDIATELY:   Following lower endoscopy (colonoscopy or flexible sigmoidoscopy):  Excessive amounts of blood in the stool  Significant tenderness or worsening of abdominal pains  Swelling of the abdomen that is new, acute  Fever of 100F or higher   For urgent or emergent issues, a gastroenterologist can be reached at any hour by calling [phone].   DIET: Your first meal following the procedure should be a small meal and then it is ok  to progress to your normal diet. Heavy or fried foods are harder to digest and may make you feel nauseous or bloated.  Likewise, meals heavy in dairy and vegetables can increase bloating.  Drink plenty of fluids but you should avoid alcoholic beverages for 24 hours.  ACTIVITY:  You should plan to take it easy for the rest of today and you should NOT DRIVE or use heavy machinery until tomorrow (because of the sedation medicines used during the test).    FOLLOW UP: Our staff will call the number listed on your records the next business day following your procedure to check on you and address any questions or concerns that you may have regarding the information given to you following your procedure. If we do not reach you, we will leave a message.  However, if you are feeling well and you are not experiencing any problems, there is no need to return our call.  We will assume that you have returned to your regular daily activities without incident.  If any biopsies were taken you will be contacted by phone or by letter within the next 1-3 weeks.  Please call us at [phone] if you have not heard about the biopsies in 3 weeks.    SIGNATURES/CONFIDENTIALITY: You and/or your care partner have signed paperwork which will be entered into your electronic medical record.  These signatures attest to the fact that that the information above on  your After Visit Summary has been reviewed and is understood.  Full responsibility of the confidentiality of this discharge information lies with you and/or your care-partner.

## 2016-03-17 NOTE — Progress Notes (Signed)
Stable to RR 

## 2016-03-18 ENCOUNTER — Telehealth: Payer: Self-pay

## 2016-03-18 NOTE — Telephone Encounter (Signed)
  Follow up Call-  Call back number 03/17/2016  Post procedure Call Back phone  # [phone]  Permission to leave phone message Yes     Patient questions:  Do you have a fever, pain , or abdominal swelling? No. Pain Score  0 *  Have you tolerated food without any problems? Yes.    Have you been able to return to your normal activities? Yes.    Do you have any questions about your discharge instructions: Diet   No. Medications  No. Follow up visit  No.  Do you have questions or concerns about your Care? No.  Actions: * If pain score is 4 or above: No action needed, pain <4.

## 2016-03-24 ENCOUNTER — Telehealth: Payer: Self-pay | Admitting: *Deleted

## 2016-03-24 NOTE — Telephone Encounter (Signed)
Patient called requesting refill on trazodone to be sent to mail order pharmacy. Last OV 02/19/16 for seasonal allergies. Last refill 12/3815 for 90 tabs

## 2016-03-25 ENCOUNTER — Other Ambulatory Visit: Payer: Self-pay | Admitting: Family Medicine

## 2016-03-25 DIAGNOSIS — G4700 Insomnia, unspecified: Secondary | ICD-10-CM

## 2016-03-25 MED ORDER — TRAZODONE HCL 100 MG PO TABS
100.0000 mg | ORAL_TABLET | Freq: Every day | ORAL | Status: DC
Start: 2016-03-25 — End: 2016-09-20

## 2016-04-07 DIAGNOSIS — M25562 Pain in left knee: Secondary | ICD-10-CM | POA: Diagnosis not present

## 2016-04-12 ENCOUNTER — Ambulatory Visit (INDEPENDENT_AMBULATORY_CARE_PROVIDER_SITE_OTHER): Payer: Commercial Managed Care - HMO | Admitting: Family Medicine

## 2016-04-12 ENCOUNTER — Other Ambulatory Visit: Payer: Self-pay | Admitting: *Deleted

## 2016-04-12 ENCOUNTER — Encounter: Payer: Self-pay | Admitting: Family Medicine

## 2016-04-12 VITALS — BP 110/77 | HR 71 | Temp 98.6°F | Resp 20 | Ht 66.0 in | Wt 194.8 lb

## 2016-04-12 DIAGNOSIS — G4700 Insomnia, unspecified: Secondary | ICD-10-CM

## 2016-04-12 DIAGNOSIS — F418 Other specified anxiety disorders: Secondary | ICD-10-CM | POA: Diagnosis not present

## 2016-04-12 MED ORDER — DULOXETINE HCL 60 MG PO CPEP: 60.0000 mg | ORAL_CAPSULE | Freq: Two times a day (BID) | ORAL | Status: DC

## 2016-04-12 MED ORDER — SERTRALINE HCL 100 MG PO TABS: 100.0000 mg | ORAL_TABLET | Freq: Every day | ORAL | Status: DC

## 2016-04-12 MED ORDER — DULOXETINE HCL 60 MG PO CPEP
60.0000 mg | ORAL_CAPSULE | Freq: Two times a day (BID) | ORAL | Status: DC
Start: 2016-04-12 — End: 2016-04-12

## 2016-04-12 NOTE — Telephone Encounter (Signed)
Order clarification for Cymbalta sent to pharmacy.

## 2016-04-12 NOTE — Patient Instructions (Signed)
As always it is a pleasure to see you.  We will follow every 6 months on anxiety, unless needed sooner.  Have a great summer!

## 2016-04-12 NOTE — Progress Notes (Signed)
Patient ID: [name] [name], female   DOB: August 17, 1946, 70 y.o.   MRN: [id number]   Subjective:    Patient ID: [name] [name], female    DOB: 1946/04/04, 71 y.o.   MRN: [id number]  Patient Care Team    Relationship Specialty Notifications Start End  Ma Hillock, DO PCP - General Family Medicine  06/02/15   Gatha Mayer, MD Consulting Physician Gastroenterology  11/24/15   Carolan Clines, MD Consulting Physician Urology  11/24/15   Calvert Cantor, MD Consulting Physician Ophthalmology  11/24/15   [facility]  Orthopedic Surgery  04/12/16     HPI  Anxiety:  Patient has continued taking Zoloft 100 mg daily. Cymbalta was increased last visit to BID and she is tolerating well. Her arthritis pain is improved in many of her joints, with th exception of her left knee which has worsened and she is seeing orthopedics. She denies any negative side effects from this medication. She is compliant with medications.  Insomnia:  Patient has been completely removed from Xanax and Tylenol PM, which she was taken for her insomnia. She was started on trazodone, which she initially benefit, but states that she now is having difficulty with sleep. She is uncertain if she's taking 1 or 2 pills the trazodone, and believe she is taking 1 pill which would equal 50 mg trazodone. Discussed sleep hygiene with her again today.    Past Medical History  Diagnosis Date  . Unspecified essential hypertension   . Unspecified venous (peripheral) insufficiency   . Irritable bowel syndrome   . Unspecified vitamin D deficiency   . Pernicious anemia   . History of gastritis     2007  . History of duodenal ulcer     2007  . Fibromyalgia   . Anxiety   . Hyperlipidemia   . Chronic cystitis   . Internal and external hemorrhoids without complication   . GERD (gastroesophageal reflux disease)   . PONV (postoperative nausea and vomiting)   . Intrinsic (urethral) sphincter deficiency (ISD)   . Urine  incontinence   . Heart murmur    Social History   Social History  . Marital Status: Divorced    Spouse Name: N/A  . Number of Children: 1  . Years of Education: N/A   Occupational History  . Retired    Social History Main Topics  . Smoking status: Former Smoker -- 1.00 packs/day for 15 years    Types: Cigarettes    Start date: 12/06/1993    Quit date: 08/09/2009  . Smokeless tobacco: Never Used     Comment: quit smoking 5 years ago  . Alcohol Use: Yes     Comment: ocassional  . Drug Use: No  . Sexual Activity: No   Other Topics Concern  . Not on file   Social History Narrative    Review of Systems Negative, with the exception of above mentioned in HPI     Objective:   Physical Exam BP 110/77 mmHg  Pulse 71  Temp(Src) 98.6 F (37 C)  Resp 20  Ht 5\' 6"  (1.676 m)  Wt 194 lb 12 oz (88.338 kg)  BMI 31.45 kg/m2  SpO2 96% Gen: Afebrile. No acute distress.  Nontoxic in appearance, well-developed, well-nourished, Caucasian female. Very Pleasant.  HENT: AT. [place].. MMM. Throat without erythema or exudates.  Mild hoarseness on exam. No cough on exam. Eyes:Pupils Equal Round Reactive to light, Extraocular movements intact,  Conjunctiva without redness, discharge or icterus.  Psych: Normal affect, dress and demeanor. Normal speech. Normal thought content and judgment.     Assessment & Plan:  [name] [name] is a 70 y.o. female present for follow up on depression, anxiety and insomnia.  Insomnia - Improved.  - Continue Trazodone 100 mg QHS. Refills Provided today.   Depression with anxiety - Improved on Cymbalta BID, refills today.   - Continue zoloft 100 mg QD - F/U 6 mos, unless needed sooner.    Follow-up 6 months.  Electronically Signed by: Howard Pouch, DO [facility] [place]

## 2016-04-28 DIAGNOSIS — N390 Urinary tract infection, site not specified: Secondary | ICD-10-CM | POA: Diagnosis not present

## 2016-04-28 DIAGNOSIS — R399 Unspecified symptoms and signs involving the genitourinary system: Secondary | ICD-10-CM | POA: Diagnosis not present

## 2016-04-29 DIAGNOSIS — M25562 Pain in left knee: Secondary | ICD-10-CM | POA: Diagnosis not present

## 2016-05-04 ENCOUNTER — Other Ambulatory Visit: Payer: Self-pay | Admitting: Family Medicine

## 2016-05-04 ENCOUNTER — Other Ambulatory Visit: Payer: Self-pay | Admitting: *Deleted

## 2016-05-04 DIAGNOSIS — M19079 Primary osteoarthritis, unspecified ankle and foot: Secondary | ICD-10-CM

## 2016-05-04 DIAGNOSIS — M19041 Primary osteoarthritis, right hand: Secondary | ICD-10-CM

## 2016-05-04 DIAGNOSIS — M19042 Primary osteoarthritis, left hand: Secondary | ICD-10-CM

## 2016-05-04 DIAGNOSIS — M1712 Unilateral primary osteoarthritis, left knee: Secondary | ICD-10-CM

## 2016-05-04 MED ORDER — TRAMADOL HCL 50 MG PO TABS: 50.0000 mg | ORAL_TABLET | Freq: Two times a day (BID) | ORAL | 1 refills | Status: DC | PRN

## 2016-05-04 NOTE — Telephone Encounter (Signed)
Patient called and left message requesting refill on her Tramadol last refill 01/30/16 45 tabs with 1 refill. Last office visit 04/12/16 Please advise

## 2016-05-05 DIAGNOSIS — M25562 Pain in left knee: Secondary | ICD-10-CM | POA: Diagnosis not present

## 2016-05-06 ENCOUNTER — Other Ambulatory Visit: Payer: Self-pay | Admitting: Family Medicine

## 2016-06-09 DIAGNOSIS — M25562 Pain in left knee: Secondary | ICD-10-CM | POA: Diagnosis not present

## 2016-06-10 IMAGING — CR DG HAND COMPLETE 3+V*R*
3 series · 3 of 3 positions shown · non-contrast
Comparison: None.

CLINICAL DATA: Joint swelling.

EXAM:
RIGHT HAND - COMPLETE 3+ VIEW

[view not recorded (1 of 3)]
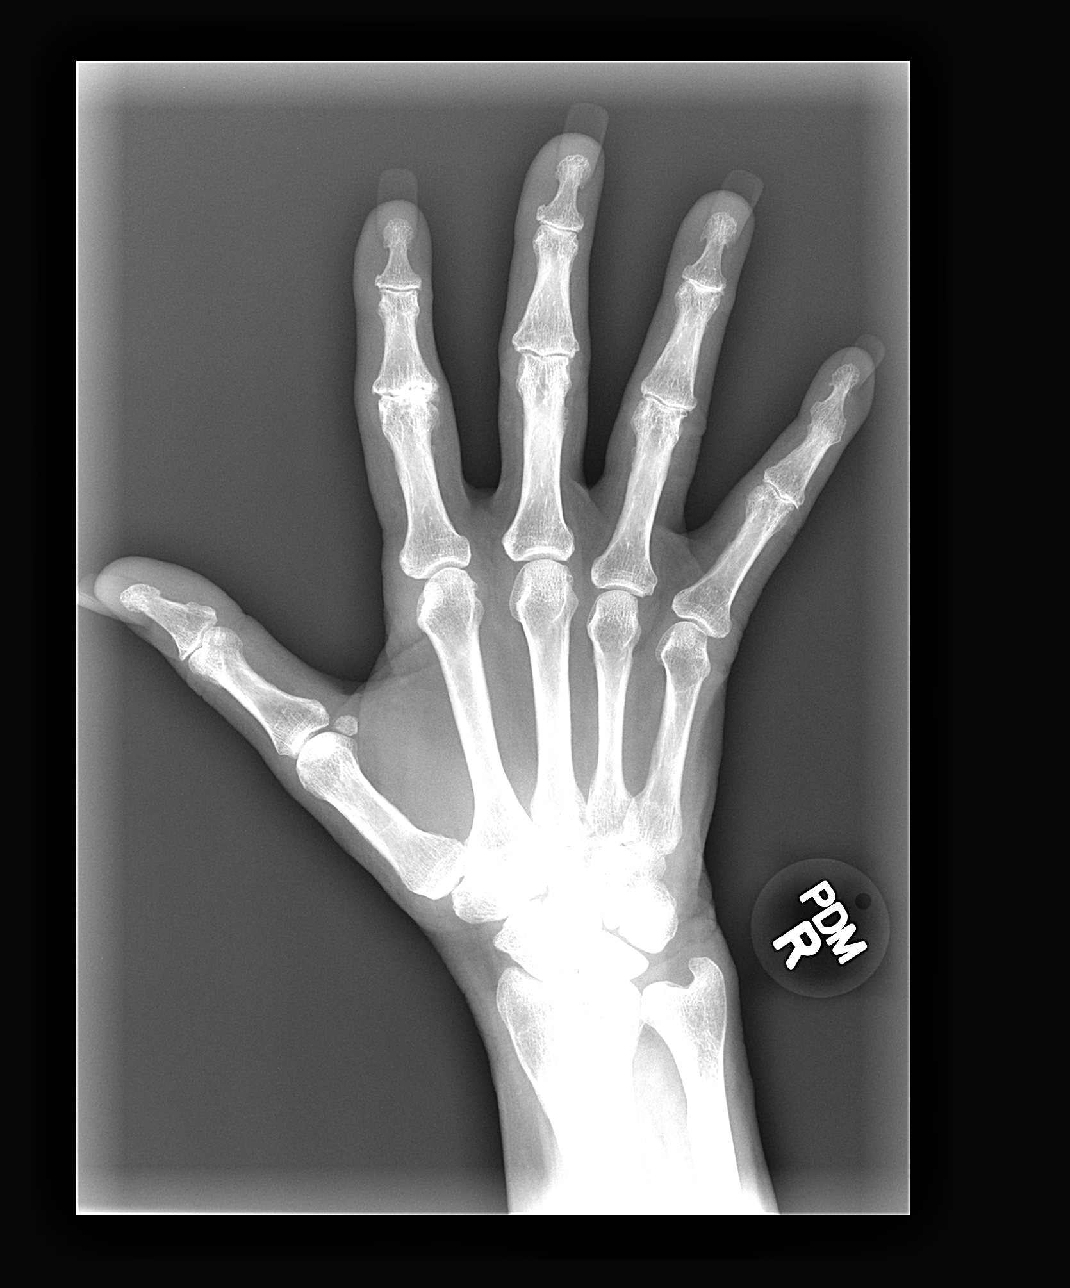

[view not recorded (2 of 3)]
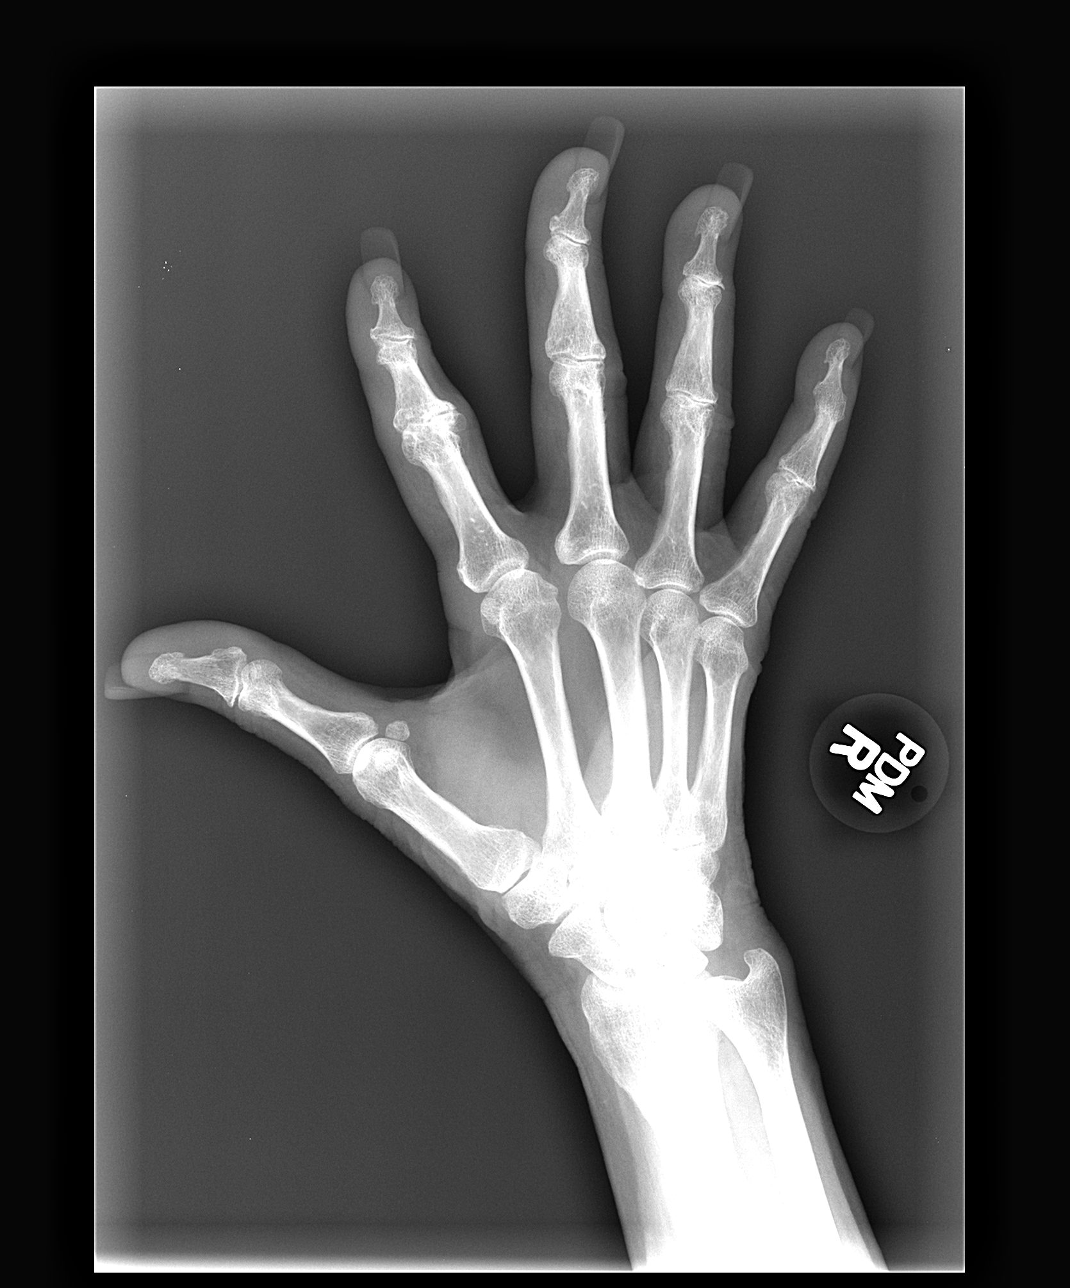

[view not recorded (3 of 3)]
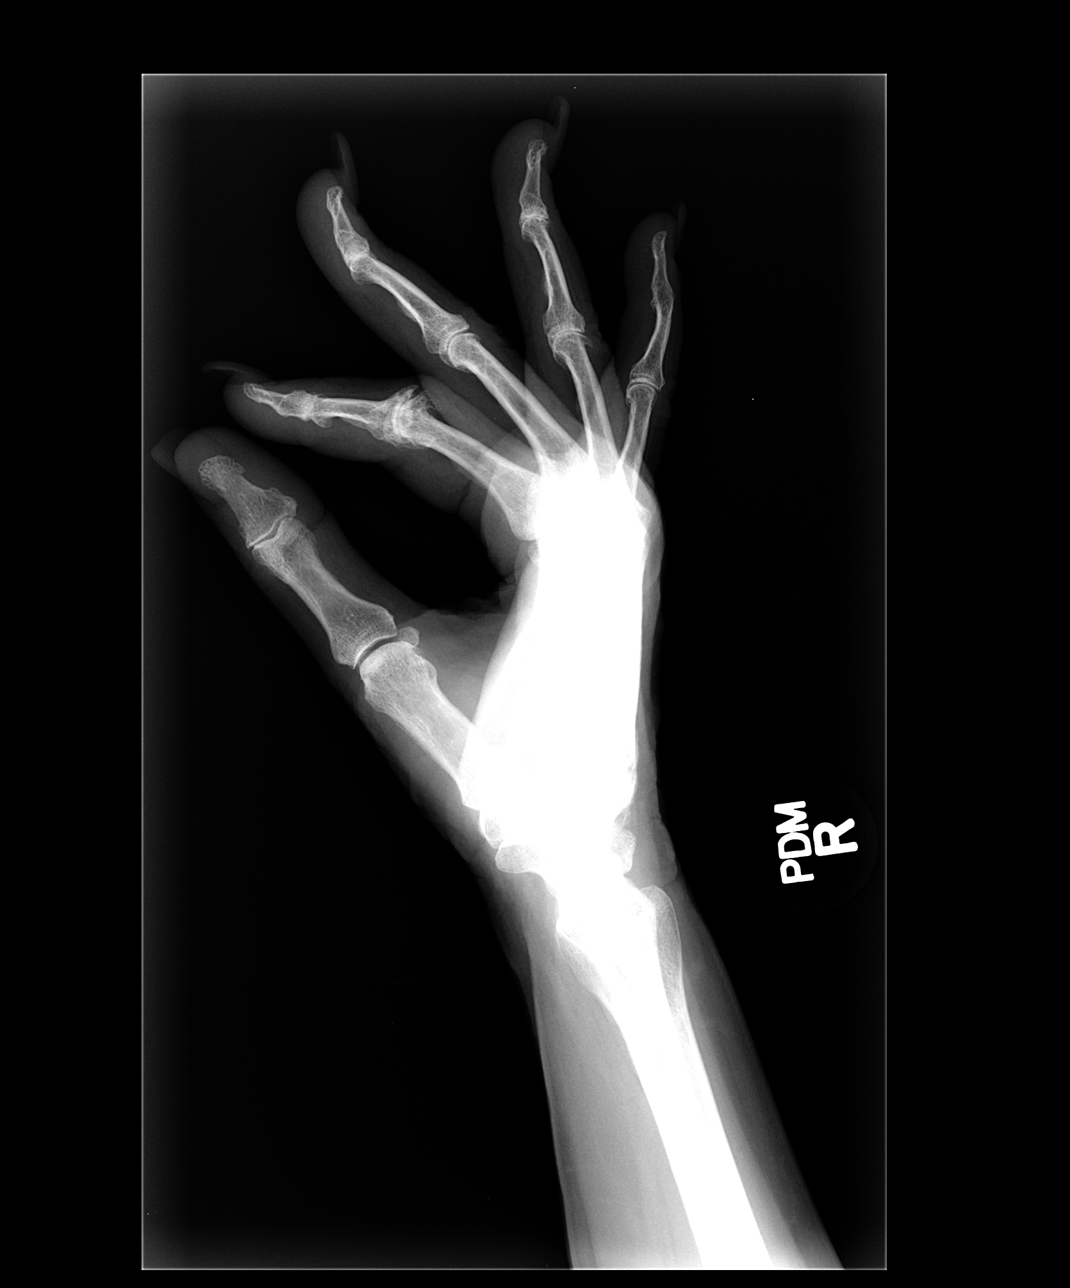

[3 of 3 positions shown; findings below may reference images not displayed]

FINDINGS: Normal mineralization. Radiocarpal and proximal interphalangeal as
well as distal interphalangeal degenerative changes are present.
Small cyst is noted the scaphoid, most likely degenerative. No
evidence of erosive arthropathy. Corticated lucency noted in the
fifth metacarpal most likely prominent vascular channel. No acute
bony or joint abnormality noted. No radiopaque foreign body .
IMPRESSION: Degenerative changes of the radiocarpal joint, proximal and distal
interphalangeal joints. No evidence of erosive arthropathy or acute
bony abnormality.

## 2016-06-23 DIAGNOSIS — M25562 Pain in left knee: Secondary | ICD-10-CM | POA: Diagnosis not present

## 2016-06-30 DIAGNOSIS — M25562 Pain in left knee: Secondary | ICD-10-CM | POA: Diagnosis not present

## 2016-07-26 ENCOUNTER — Ambulatory Visit
Admission: RE | Admit: 2016-07-26 | Discharge: 2016-07-26 | Disposition: A | Discharge status: Home / Self Care | Payer: Commercial Managed Care - HMO | Source: Ambulatory Visit | Attending: Family Medicine | Admitting: Family Medicine

## 2016-07-26 ENCOUNTER — Telehealth: Payer: Self-pay | Admitting: Family Medicine

## 2016-07-26 ENCOUNTER — Ambulatory Visit
Admission: RE | Admit: 2016-07-26 | Discharge: 2016-07-26 | Disposition: A | Discharge status: Home / Self Care | Payer: Commercial Managed Care - HMO | Source: Ambulatory Visit

## 2016-07-26 DIAGNOSIS — M810 Age-related osteoporosis without current pathological fracture: Secondary | ICD-10-CM

## 2016-07-26 DIAGNOSIS — Z1231 Encounter for screening mammogram for malignant neoplasm of breast: Secondary | ICD-10-CM

## 2016-07-26 DIAGNOSIS — Z780 Asymptomatic menopausal state: Secondary | ICD-10-CM | POA: Diagnosis not present

## 2016-07-26 DIAGNOSIS — M8580 Other specified disorders of bone density and structure, unspecified site: Secondary | ICD-10-CM

## 2016-07-26 NOTE — Telephone Encounter (Signed)
Please inform pt her bone density resulted with osteoporosis. This placed her at risk for increased fracture.  I would like to discuss potential treatment medications with her and how to maintain bone health. Please have schedule an appt at her earliest convenience for review of scan and treatment options.

## 2016-07-28 NOTE — Telephone Encounter (Signed)
Spoke with patient reviewed results and scheduled appt.

## 2016-08-02 ENCOUNTER — Telehealth: Payer: Self-pay | Admitting: *Deleted

## 2016-08-02 ENCOUNTER — Encounter: Payer: Self-pay | Admitting: Family Medicine

## 2016-08-02 ENCOUNTER — Ambulatory Visit (INDEPENDENT_AMBULATORY_CARE_PROVIDER_SITE_OTHER): Payer: Commercial Managed Care - HMO | Admitting: Family Medicine

## 2016-08-02 VITALS — BP 111/75 | HR 74 | Temp 97.9°F | Resp 20 | Ht 66.0 in | Wt 195.8 lb

## 2016-08-02 DIAGNOSIS — M810 Age-related osteoporosis without current pathological fracture: Secondary | ICD-10-CM

## 2016-08-02 MED ORDER — DICYCLOMINE HCL 10 MG PO CAPS: 10.0000 mg | ORAL_CAPSULE | Freq: Three times a day (TID) | ORAL | 5 refills | Status: DC

## 2016-08-02 NOTE — Patient Instructions (Signed)
It was nice to see you. I refilled your bentyl for you.  We will order the prolia and be in touch with you when we can schedule you.  Remember to take 2000 mg vit d and 1200 mg calcium, weight bearing exercise.    Osteoporosis Osteoporosis is the thinning and loss of density in the bones. Osteoporosis makes the bones more brittle, fragile, and likely to break (fracture). Over time, osteoporosis can cause the bones to become so weak that they fracture after a simple fall. The bones most likely to fracture are the bones in the hip, wrist, and spine. CAUSES  The exact cause is not known. RISK FACTORS Anyone can develop osteoporosis. You may be at greater risk if you have a family history of the condition or have poor nutrition. You may also have a higher risk if you are:   Female.   66 years old or older.  A smoker.  Not physically active.   White or Asian.  Slender. SIGNS AND SYMPTOMS  A fracture might be the first sign of the disease, especially if it results from a fall or injury that would not usually cause a bone to break. Other signs and symptoms include:   Low back and neck pain.  Stooped posture.  Height loss. DIAGNOSIS  To make a diagnosis, your health care provider may:  Take a medical history.  Perform a physical exam.  Order tests, such as:  A bone mineral density test.  A dual-energy X-ray absorptiometry test. TREATMENT  The goal of osteoporosis treatment is to strengthen your bones to reduce your risk of a fracture. Treatment may involve:  Making lifestyle changes, such as:  Eating a diet rich in calcium.  Doing weight-bearing and muscle-strengthening exercises.  Stopping tobacco use.  Limiting alcohol intake.  Taking medicine to slow the process of bone loss or to increase bone density.  Monitoring your levels of calcium and vitamin D. HOME CARE INSTRUCTIONS  Include calcium and vitamin D in your diet. Calcium is important for bone health,  and vitamin D helps the body absorb calcium.  Perform weight-bearing and muscle-strengthening exercises as directed by your health care provider.  Do not use any tobacco products, including cigarettes, chewing tobacco, and electronic cigarettes. If you need help quitting, ask your health care provider.  Limit your alcohol intake.  Take medicines only as directed by your health care provider.  Keep all follow-up visits as directed by your health care provider. This is important.  Take precautions at home to lower your risk of falling, such as:  Keeping rooms well lit and clutter free.  Installing safety rails on stairs.  Using rubber mats in the bathroom and other areas that are often wet or slippery. SEEK IMMEDIATE MEDICAL CARE IF:  You fall or injure yourself.    This information is not intended to replace advice given to you by your health care provider. Make sure you discuss any questions you have with your health care provider.   Document Released: 06/30/2005 Document Revised: 10/11/2014 Document Reviewed: 02/28/2014 Elsevier Interactive Patient Education [employer].

## 2016-08-02 NOTE — Telephone Encounter (Signed)
Prior authorization submitted.

## 2016-08-02 NOTE — Progress Notes (Signed)
[name] [name] , 1946/08/25, 70 y.o., female MRN: [id number] Patient Care Team    Relationship Specialty Notifications Start End  Ma Hillock, DO PCP - General Family Medicine  06/02/15   Gatha Mayer, MD Consulting Physician Gastroenterology  11/24/15   Carolan Clines, MD Consulting Physician Urology  11/24/15   Calvert Cantor, MD Consulting Physician Ophthalmology  11/24/15   [facility]  Orthopedic Surgery  04/12/16     CC: bone density Subjective: Pt presents for an OV to discuss her bone density results (below).  She has severe GERD and GI issues, that would not make her a candidate for fosamax.  If she is able she would like to start prolia. She states her sister is on Prolia. She does not exercise as routinely since her knee has been bothering her, but is working with ortho with viscous injections. She has normal kidney function.   Results of DEXA 07/2016: -2.7 vertebral and -1.7 left hip. She does take 2000 mg of vitamin D daily. She is taking 1200 mg of calcium daily.   Allergies  Allergen Reactions  . Morphine Nausea And Vomiting    SEVERE  . Sulfa Antibiotics Swelling   Social History  Substance Use Topics  . Smoking status: Former Smoker    Packs/day: 1.00    Years: 15.00    Types: Cigarettes    Start date: 12/06/1993    Quit date: 08/09/2009  . Smokeless tobacco: Never Used     Comment: quit smoking 5 years ago  . Alcohol use Yes     Comment: ocassional   Past Medical History:  Diagnosis Date  . Anxiety   . Chronic cystitis   . Fibromyalgia   . GERD (gastroesophageal reflux disease)   . Heart murmur   . History of duodenal ulcer    2007  . History of gastritis    2007  . Hyperlipidemia   . Internal and external hemorrhoids without complication   . Intrinsic (urethral) sphincter deficiency (ISD)   . Irritable bowel syndrome   . Pernicious anemia   . PONV (postoperative nausea and vomiting)   . Unspecified essential  hypertension   . Unspecified venous (peripheral) insufficiency   . Unspecified vitamin D deficiency   . Urine incontinence    Past Surgical History:  Procedure Laterality Date  . ANTERIOR AND POSTERIOR VAGINAL REPAIR  04-29-2004   AND TRANSVAGINAL TAPE SLING  . ANTERIOR CERVICAL DECOMP/DISCECTOMY FUSION  1999  . APPENDECTOMY  1984  . BILATERAL SALPINGOOPHORECTOMY  1984  . CARDIAC CATHETERIZATION  10-03-2002   DR DEGENT   NORMAL CORONARIES ARTERIES/  EF 55%  . CYSTOSCOPY N/A 08/27/2013   Procedure: CYSTOSCOPY;  Surgeon: Ailene Rud, MD;  Location: [facility];  Service: Urology;  Laterality: N/A;  . CYSTOSCOPY WITH INJECTION N/A 12/17/2013   Procedure: Ascencion Dike WITH INJECTION;  Surgeon: Ailene Rud, MD;  Location: [facility];  Service: Urology;  Laterality: N/A;  . DILATION AND CURETTAGE OF UTERUS    . EXCISION RIGHT NECK AND LEFT BREAST SEBACEOUS CYST  05-18-2002  . HEMORRHOID BANDING    . TOTAL ABDOMINAL HYSTERECTOMY  1986  . TUBAL LIGATION    . VAGINAL PROLAPSE REPAIR N/A 08/27/2013   Procedure: ANTERIOR VAGINAL VAULT SUSPENSION, KELLY PLICATION WITH SACROSPINOUS LIGAMENT FIXATION AND XENFORM BOVINE DERMIS GRAFT AUGMENTATION, URETHRAL EXPLORATION, URETHROLYSIS, EXPLANTATION OF TVT TAPE, IMPLANTATION OF FLOSEAL, IMPLANTATION OF XENFORM BOVINE GRAFT IN PERIURETHRAL SPACE;  Surgeon: Pierre Bali  Joya San, MD;  Location: [facility];  Service: Urology;  Laterality: N/A   Family History  Problem Relation Age of Onset  . Lung cancer Father   . Coronary artery disease Father   . Hypertension Mother   . Heart disease Mother   . Myelodysplastic syndrome Mother   . Arthritis Mother   . Anemia Maternal Aunt   . Fibromyalgia Sister   . Hypertension Brother   . Anemia Brother   . Colon polyps      aunt  . Colon cancer Neg Hx      Medication List       Accurate as of 08/02/16  8:22 AM. Always use your most recent  med list.          aspirin 81 MG tablet Take 81 mg by mouth daily.   atenolol 25 MG tablet Commonly known as:  TENORMIN TAKE 1 TABLET EVERY MORNING   calcium carbonate 600 MG Tabs tablet Commonly known as:  OS-CAL Take 600 mg by mouth daily.   cholecalciferol 1000 units tablet Commonly known as:  VITAMIN D Take 2 tablets (2,000 Units total) by mouth daily.   cyclobenzaprine 5 MG tablet Commonly known as:  FLEXERIL Take 5 mg by mouth 2 (two) times daily at 8 am and 10 pm.   diclofenac sodium 1 % Gel Commonly known as:  VOLTAREN Apply dime sized amount to finger 2-3 times daily.   dicyclomine 10 MG capsule Commonly known as:  BENTYL Take 1 capsule (10 mg total) by mouth 4 (four) times daily -  before meals and at bedtime.   DULoxetine 60 MG capsule Commonly known as:  CYMBALTA Take 1 capsule (60 mg total) by mouth 2 (two) times daily.   FISH OIL PO Take by mouth daily.   folic acid Q000111Q MCG tablet Commonly known as:  FOLVITE Take 400 mcg by mouth daily.   GINGER ROOT PO Take by mouth.   ipratropium 0.06 % nasal spray Commonly known as:  ATROVENT Place 2 sprays into both nostrils 4 (four) times daily.   IRON PO Take 65 mg by mouth every morning.   lisinopril-hydrochlorothiazide 10-12.5 MG tablet Commonly known as:  PRINZIDE,ZESTORETIC TAKE 1 TABLET EVERY DAY   omeprazole 20 MG capsule Commonly known as:  PRILOSEC TAKE 1 CAPSULE EVERY MORNING  30  MINUTES BEFORE A MEAL   pravastatin 40 MG tablet Commonly known as:  PRAVACHOL TAKE 1 TABLET EVERY DAY   RESTASIS 0.05 % ophthalmic emulsion Generic drug:  cycloSPORINE   sertraline 100 MG tablet Commonly known as:  ZOLOFT Take 1 tablet (100 mg total) by mouth daily.   traMADol 50 MG tablet Commonly known as:  ULTRAM Take 1 tablet (50 mg total) by mouth 2 (two) times daily as needed.   traZODone 100 MG tablet Commonly known as:  DESYREL Take 1 tablet (100 mg total) by mouth at bedtime.   Turmeric  Curcumin 500 MG Caps Take 1,000 mg by mouth.   Vitamin B-12 1000 MCG Subl Place under the tongue daily.   WOMENS MULTIVITAMIN PLUS PO Take 1 tablet by mouth daily.   XIIDRA 5 % Soln Generic drug:  Lifitegrast Reported on 04/12/2016       No results found for this or any previous visit (from the past 24 hour(s)). No results found.   ROS: Negative, with the exception of above mentioned in HPI   Objective:  BP 111/75 (BP Location: Right Arm, Patient Position: Sitting, Cuff Size:  Normal)   Pulse 74   Temp 97.9 F (36.6 C)   Resp 20   Ht 5\' 6"  (1.676 m)   Wt 195 lb 12.8 oz (88.8 kg)   SpO2 98%   BMI 31.60 kg/m  Body mass index is 31.6 kg/m. Gen: Afebrile. No acute distress. Nontoxic in appearance, well developed, well nourished. Pleasant caucasian female.  HENT: AT. [place].MMM Eyes:Pupils Equal Round Reactive to light, Extraocular movements intact,  Conjunctiva without redness, discharge or icterus.  Assessment/Plan: [name] [name] is a 70 y.o. female present for OV for  osteoporosis without current pathological fracture Dicussed in detail with patient her DEXA results and available therapies for treatment of OP.  She would not be a candidate for fosamax with her history. She is agreeable to Prolia, but does has concerns on her out-of-pocket cost.  Prolia prescribed q 6 months. Rpt DEXA 2-3 years.     electronically signed by:  Howard Pouch, DO  [place]

## 2016-08-02 NOTE — Telephone Encounter (Signed)
Please submit for prior authorization for Prolia for this patient she cannot take fosamax due to GI issues. Dx code M81.0. Per Dr Raoul Pitch.

## 2016-08-03 DIAGNOSIS — N3946 Mixed incontinence: Secondary | ICD-10-CM | POA: Diagnosis not present

## 2016-08-05 NOTE — Telephone Encounter (Signed)
Insurance verification came back showing est cost for patient around $250.  Pt's insurance requires PA for medication.  Filled form out today and placed on Dr Lucita Lora desk.  Once signed I will fax to [place].

## 2016-08-10 DIAGNOSIS — N3946 Mixed incontinence: Secondary | ICD-10-CM | POA: Diagnosis not present

## 2016-08-11 ENCOUNTER — Other Ambulatory Visit: Payer: Self-pay | Admitting: *Deleted

## 2016-08-11 DIAGNOSIS — M1712 Unilateral primary osteoarthritis, left knee: Secondary | ICD-10-CM

## 2016-08-11 DIAGNOSIS — M19079 Primary osteoarthritis, unspecified ankle and foot: Secondary | ICD-10-CM

## 2016-08-11 DIAGNOSIS — M19042 Primary osteoarthritis, left hand: Secondary | ICD-10-CM

## 2016-08-11 DIAGNOSIS — M19041 Primary osteoarthritis, right hand: Secondary | ICD-10-CM

## 2016-08-11 MED ORDER — TRAMADOL HCL 50 MG PO TABS: 50.0000 mg | ORAL_TABLET | Freq: Two times a day (BID) | ORAL | 5 refills | Status: DC | PRN

## 2016-08-11 NOTE — Telephone Encounter (Signed)
Request for refill on Tramadol received. Last refill 05/04/16 for 45 tabs. Last seen 08/02/16

## 2016-08-17 ENCOUNTER — Other Ambulatory Visit: Payer: Self-pay | Admitting: Family Medicine

## 2016-08-17 NOTE — Telephone Encounter (Signed)
Patient called stating that [employer] sent her a letter of approval.  Pt estimated cost is around $230.  Patient can not afford that every 6 months.  I have contacted our rep for prolia to see how to get her some financial assistance.  Awaiting a response.

## 2016-08-23 NOTE — Telephone Encounter (Signed)
Dan from [place] contacted me this morning with [employer] # [phone] for patient to call to get financial assistance for prolia injection.  Pt aware to contact number.

## 2016-08-31 DIAGNOSIS — N3946 Mixed incontinence: Secondary | ICD-10-CM | POA: Diagnosis not present

## 2016-09-08 NOTE — Telephone Encounter (Signed)
Spoke to patient today and that foundation is unable to assist patient with financials at this time.  Also, spoke to representative with prolia to see if there are other financial assistance options for patients, awaiting return call.

## 2016-09-08 NOTE — Telephone Encounter (Signed)
Spoke to prolia rep and she states at this time there is no [employer] traffic [employer] available.  This can change, especially at the beginning of the year, but I wanted to keep Dr Raoul Pitch in the loop to see if she wanted to continue trying for prolia or do different treatment.  Please advise.

## 2016-09-09 ENCOUNTER — Encounter: Payer: Self-pay | Admitting: *Deleted

## 2016-09-09 NOTE — Telephone Encounter (Signed)
Unfortunately, pt has severe GERD and oral agents are contraindicated for her. The other option  is reclast, but there will liely be copay/deductible with her insurance as well. Can we look into seeing if this is a more affordable option for her. Consider it is completed sameday/hospital, coverage may be different with her insurance.

## 2016-09-13 NOTE — Telephone Encounter (Signed)
Dan from [employer] contacted me this afternoon with more information regarding grant money available.  I contacted pt to see if she could call [phone] to see if she qualified for the grant money available.  I also talked to her about contacting her insurance regarding reclast to see what their coverage benefits were on that to see which may be easier for her.  Pt agreed to do those things and contact us to let us know what she finds out.

## 2016-09-15 ENCOUNTER — Telehealth: Payer: Self-pay | Admitting: Family Medicine

## 2016-09-15 NOTE — Telephone Encounter (Signed)
Patient left VM on front desk line asking for Vinnie Level to [facility]

## 2016-09-15 NOTE — Telephone Encounter (Signed)
Patient called and states she will be dropping off paperwork for reclast assistance to completed. Needs Dr Serena Croissant.

## 2016-09-16 NOTE — Telephone Encounter (Signed)
Spoke with patient reviewed information regarding possible reclast infusion.

## 2016-09-17 ENCOUNTER — Other Ambulatory Visit: Payer: Self-pay | Admitting: Urology

## 2016-09-20 ENCOUNTER — Other Ambulatory Visit: Payer: Self-pay | Admitting: *Deleted

## 2016-09-20 DIAGNOSIS — G4700 Insomnia, unspecified: Secondary | ICD-10-CM

## 2016-09-20 MED ORDER — TRAZODONE HCL 100 MG PO TABS: 100.0000 mg | ORAL_TABLET | Freq: Every day | ORAL | 3 refills | Status: DC

## 2016-09-20 NOTE — Telephone Encounter (Signed)
Patient requesting refill on trazodone. Last refill on 03/25/16 90 tabs with 1 refill. Last office visit 08/02/16

## 2016-09-22 DIAGNOSIS — M1712 Unilateral primary osteoarthritis, left knee: Secondary | ICD-10-CM | POA: Diagnosis not present

## 2016-10-12 ENCOUNTER — Ambulatory Visit: Payer: Commercial Managed Care - HMO | Admitting: Family Medicine

## 2016-10-14 ENCOUNTER — Ambulatory Visit (INDEPENDENT_AMBULATORY_CARE_PROVIDER_SITE_OTHER): Payer: Medicare HMO | Admitting: Family Medicine

## 2016-10-14 ENCOUNTER — Encounter: Payer: Self-pay | Admitting: Family Medicine

## 2016-10-14 VITALS — BP 130/81 | HR 68 | Temp 97.9°F | Resp 20 | Ht 66.0 in | Wt 191.8 lb

## 2016-10-14 DIAGNOSIS — G4700 Insomnia, unspecified: Secondary | ICD-10-CM

## 2016-10-14 DIAGNOSIS — M810 Age-related osteoporosis without current pathological fracture: Secondary | ICD-10-CM | POA: Diagnosis not present

## 2016-10-14 DIAGNOSIS — F418 Other specified anxiety disorders: Secondary | ICD-10-CM | POA: Diagnosis not present

## 2016-10-14 DIAGNOSIS — F410 Panic disorder [episodic paroxysmal anxiety] without agoraphobia: Secondary | ICD-10-CM | POA: Insufficient documentation

## 2016-10-14 HISTORY — DX: Panic disorder (episodic paroxysmal anxiety): F41.0

## 2016-10-14 MED ORDER — SERTRALINE HCL 100 MG PO TABS: 100.0000 mg | ORAL_TABLET | Freq: Every day | ORAL | 1 refills | Status: DC

## 2016-10-14 MED ORDER — LORAZEPAM 0.5 MG PO TABS: 0.5000 mg | ORAL_TABLET | Freq: Once | ORAL | 1 refills | Status: DC | PRN

## 2016-10-14 MED ORDER — DULOXETINE HCL 60 MG PO CPEP: 60.0000 mg | ORAL_CAPSULE | Freq: Two times a day (BID) | ORAL | 1 refills | Status: DC

## 2016-10-14 MED ORDER — TRAZODONE HCL 100 MG PO TABS: 100.0000 mg | ORAL_TABLET | Freq: Every day | ORAL | 1 refills | Status: DC

## 2016-10-14 NOTE — Patient Instructions (Addendum)
I have ordered all your meds through mail in and ativan for panic sent to local pharmacy.   Follow every 6 months on these acute issues.  Yearly for preventive/well visit or CPE   Please help [place] help you:  It is a privilege to be able to take care of great patients such as yourself. We are honored you have chosen [facility] for your Primary Care home. Below you will find basic instructions that you may need to access in the future. Please help [place] help you by reading the instructions, which cover many of the frequent questions we experience.   Prescription refills and request:  -In order to allow more efficient response time, please call your pharmacy for all refills. They will forward the request electronically to [place]. This allows for the quickest possible response. Request left on a nurse line can take longer to refill, since these are checked as time allows between office patients and other phone calls.  - refill request can take up to 3-5 working days to complete.  - If request is sent electronically and request is appropiate, it is usually completed in 1-2 business days.  - all patients will need to be seen routinely for all chronic medical conditions requiring prescription medications (see follow-up below). If you are overdue for follow up on your condition, you will be asked to make an appointment and we will call in enough medication to cover you until your appointment (up to 30 days).  - all controlled substances will require a face to face visit to request/refill.  - if you desire your prescriptions to go through a new pharmacy, and have an active script at original pharmacy, you will need to call your pharmacy and have scripts transferred to new pharmacy. This is completed between the pharmacy locations and not by your provider.    Results: If any images or labs were ordered, it can take up to 1 week to get results depending on the test ordered and the lab/facility running and  resulting the test. - Normal or stable results, which do not need further discussion, will be released to your mychart immediately with attached note to you. A call will not be generated for normal results. Please make certain to sign up for mychart. If you have questions on how to activate your mychart you can call the front office.  - If your results need further discussion, our office will attempt to contact you via phone, and if unable to reach you after 2 attempts, we will release your abnormal result to your mychart with instructions.  - All results will be automatically released in mychart after 1 week.  - Your provider will provide you with explanation and instruction on all relevant material in your results. Please keep in mind, results and labs may appear confusing or abnormal to the untrained eye, but it does not mean they are actually abnormal for you personally. If you have any questions about your results that are not covered, or you desire more detailed explanation than what was provided, you should make an appointment with your provider to do so.   Our office handles many outgoing and incoming calls daily. If we have not contacted you within 1 week about your results, please check your mychart to see if there is a message first and if not, then contact our office.  In helping with this matter, you help decrease call volume, and therefore allow [place] to be able to respond to patients  needs more efficiently.   Acute office visits (sick visit):  An acute visit is intended for a new problem and are scheduled in shorter time slots to allow schedule openings for patients with new problems. This is the appropriate visit to discuss a new problem. In order to provide you with excellent quality medical care with proper time for you to explain your problem, have an exam and receive treatment with instructions, these appointments should be limited to one new problem per visit. If you experience a new  problem, in which you desire to be addressed, please make an acute office visit, we save openings on the schedule to accommodate you. Please do not save your new problem for any other type of visit, let us take care of it properly and quickly for you.   Follow up visits:  Depending on your condition(s) your provider will need to see you routinely in order to provide you with quality care and prescribe medication(s). Most chronic conditions (Example: hypertension, Diabetes, depression/anxiety... etc), require visits a couple times a year. Your provider will instruct you on proper follow up for your personal medical conditions and history. Please make certain to make follow up appointments for your condition as instructed. Failing to do so could result in lapse in your medication treatment/refills. If you request a refill, and are overdue to be seen on a condition, we will always provide you with a 30 day script (once) to allow you time to schedule.    Medicare wellness (well visit): - we have a wonderful Nurse Maudie Mercury), that will meet with you and provide you will yearly medicare wellness visits. These visits should occur yearly (can not be scheduled less than 1 calendar year apart) and cover preventive health, immunizations, advance directives and screenings you are entitled to yearly through your medicare benefits. Do not miss out on your entitled benefits, this is when medicare will pay for these benefits to be ordered for you.  These are strongly encouraged by your provider and is the appropriate type of visit to make certain you are up to date with all preventive health benefits. If you have not had your medicare wellness exam in the last 12 months, please make certain to schedule one by calling the office and schedule your medicare wellness with Maudie Mercury as soon as possible.   Yearly physical (well visit):  - Adults are recommended to be seen yearly for physicals. Check with your insurance and date of your  last physical, most insurances require one calendar year between physicals. Physicals include all preventive health topics, screenings, medical exam and labs that are appropriate for gender/age and history. You may have fasting labs needed at this visit. This is a well visit (not a sick visit), acute topics should not be covered during this visit.  - Pediatric patients are seen more frequently when they are younger. Your provider will advise you on well child visit timing that is appropriate for your their age. - This is not a medicare wellness visit. Medicare wellness exams do not have an exam portion to the visit. Some medicare companies allow for a physical, some do not allow a yearly physical. If your medicare allows a yearly physical you can schedule the medicare wellness with our nurse Maudie Mercury and have your physical with your provider after, on the same day. Please check with insurance for your full benefits.   Late Policy/No Shows:  - all new patients should arrive 15-30 minutes earlier than appointment to allow [place] time  to  obtain all personal demographics,  insurance information and for you to complete office paperwork. - All established patients should arrive 10-15 minutes earlier than appointment time to update all information and be checked in .  - In our best efforts to run on time, if you are late for your appointment you will be asked to either reschedule or if able, we will work you back into the schedule. There will be a wait time to work you back in the schedule,  depending on availability.  - If you are unable to make it to your appointment as scheduled, please call 24 hours ahead of time to allow [place] to fill the time slot with someone else who needs to be seen. If you do not cancel your appointment ahead of time, you may be charged a no show fee.

## 2016-10-14 NOTE — Progress Notes (Signed)
[name] [name] , 03-24-1946, 71 y.o., female MRN: [id number] Patient Care Team    Relationship Specialty Notifications Start End  Ma Hillock, DO PCP - General Family Medicine  06/02/15   Gatha Mayer, MD Consulting Physician Gastroenterology  11/24/15   Carolan Clines, MD Consulting Physician Urology  11/24/15   Calvert Cantor, MD Consulting Physician Ophthalmology  11/24/15   [facility]  Orthopedic Surgery  04/12/16     CC: follow up Subjective:  Anxiety/panic attack: Patient reports compliance with Zoloft 100 mg daily, Cymbalta 60 mg twice a day. He feels her anxiety and arthritis is well controlled on medication. She does have continued knee pain, which he follows with orthopedics. She denies any side effects to medication, except for occasional dizziness. She does have an upcoming surgery for her bladder at the end of the month, and she does endorse increased anxiety surrounding procedure. She states she has had a prescription for Xanax in the past that she takes very infrequently and only when she has increased anxiety. She is asking for refills on this.  Insomnia:   patient reports compliance with 100 mg of trazodone daily at bedtime. She states she is sleeping well on this medication. She is having a surgery and having increased anxiety. She had been on Xanax in the past to help her with sleep when she had increased anxiety.   Allergies  Allergen Reactions  . Morphine Nausea And Vomiting    SEVERE  . Sulfa Antibiotics Swelling   Social History  Substance Use Topics  . Smoking status: Former Smoker    Packs/day: 1.00    Years: 15.00    Types: Cigarettes    Start date: 12/06/1993    Quit date: 08/09/2009  . Smokeless tobacco: Never Used     Comment: quit smoking 5 years ago  . Alcohol use Yes     Comment: ocassional   Past Medical History:  Diagnosis Date  . Anxiety   . Chronic cystitis   . Fibromyalgia   . GERD (gastroesophageal reflux  disease)   . Heart murmur   . History of duodenal ulcer    2007  . History of gastritis    2007  . Hyperlipidemia   . Internal and external hemorrhoids without complication   . Intrinsic (urethral) sphincter deficiency (ISD)   . Irritable bowel syndrome   . Pernicious anemia   . PONV (postoperative nausea and vomiting)   . Unspecified essential hypertension   . Unspecified venous (peripheral) insufficiency   . Unspecified vitamin D deficiency   . Urine incontinence    Past Surgical History:  Procedure Laterality Date  . ANTERIOR AND POSTERIOR VAGINAL REPAIR  04-29-2004   AND TRANSVAGINAL TAPE SLING  . ANTERIOR CERVICAL DECOMP/DISCECTOMY FUSION  1999  . APPENDECTOMY  1984  . BILATERAL SALPINGOOPHORECTOMY  1984  . CARDIAC CATHETERIZATION  10-03-2002   DR DEGENT   NORMAL CORONARIES ARTERIES/  EF 55%  . CYSTOSCOPY N/A 08/27/2013   Procedure: CYSTOSCOPY;  Surgeon: Ailene Rud, MD;  Location: [facility];  Service: Urology;  Laterality: N/A;  . CYSTOSCOPY WITH INJECTION N/A 12/17/2013   Procedure: Ascencion Dike WITH INJECTION;  Surgeon: Ailene Rud, MD;  Location: [facility];  Service: Urology;  Laterality: N/A;  . DILATION AND CURETTAGE OF UTERUS    . EXCISION RIGHT NECK AND LEFT BREAST SEBACEOUS CYST  05-18-2002  . HEMORRHOID BANDING    . TOTAL ABDOMINAL HYSTERECTOMY  1986  .  TUBAL LIGATION    . VAGINAL PROLAPSE REPAIR N/A 08/27/2013   Procedure: ANTERIOR VAGINAL VAULT SUSPENSION, KELLY PLICATION WITH SACROSPINOUS LIGAMENT FIXATION AND XENFORM BOVINE DERMIS GRAFT AUGMENTATION, URETHRAL EXPLORATION, URETHROLYSIS, EXPLANTATION OF TVT TAPE, IMPLANTATION OF FLOSEAL, IMPLANTATION OF XENFORM BOVINE GRAFT IN PERIURETHRAL SPACE;  Surgeon: Ailene Rud, MD;  Location: [facility];  Service: Urology;  Laterality: N/A   Family History  Problem Relation Age of Onset  . Lung cancer Father   . Coronary artery disease  Father   . Hypertension Mother   . Heart disease Mother   . Myelodysplastic syndrome Mother   . Arthritis Mother   . Anemia Maternal Aunt   . Fibromyalgia Sister   . Hypertension Brother   . Anemia Brother   . Colon polyps      aunt  . Colon cancer Neg Hx    Allergies as of 10/14/2016      Reactions   Morphine Nausea And Vomiting   SEVERE   Sulfa Antibiotics Swelling      Medication List       Accurate as of 10/14/16  8:18 AM. Always use your most recent med list.          aspirin 81 MG tablet Take 81 mg by mouth daily.   atenolol 25 MG tablet Commonly known as:  TENORMIN TAKE 1 TABLET EVERY MORNING   calcium carbonate 600 MG Tabs tablet Commonly known as:  OS-CAL Take 600 mg by mouth daily.   cholecalciferol 1000 units tablet Commonly known as:  VITAMIN D Take 2 tablets (2,000 Units total) by mouth daily.   cyclobenzaprine 5 MG tablet Commonly known as:  FLEXERIL Take 5 mg by mouth 2 (two) times daily at 8 am and 10 pm.   diclofenac sodium 1 % Gel Commonly known as:  VOLTAREN Apply dime sized amount to finger 2-3 times daily.   dicyclomine 10 MG capsule Commonly known as:  BENTYL Take 1 capsule (10 mg total) by mouth 4 (four) times daily -  before meals and at bedtime.   DULoxetine 60 MG capsule Commonly known as:  CYMBALTA Take 1 capsule (60 mg total) by mouth 2 (two) times daily.   FISH OIL PO Take by mouth daily.   folic acid Q000111Q MCG tablet Commonly known as:  FOLVITE Take 400 mcg by mouth daily.   GINGER ROOT PO Take by mouth.   ipratropium 0.06 % nasal spray Commonly known as:  ATROVENT Place 2 sprays into both nostrils 4 (four) times daily.   IRON PO Take 65 mg by mouth every morning.   lisinopril-hydrochlorothiazide 10-12.5 MG tablet Commonly known as:  PRINZIDE,ZESTORETIC TAKE 1 TABLET EVERY DAY   omeprazole 20 MG capsule Commonly known as:  PRILOSEC TAKE 1 CAPSULE EVERY MORNING  30  MINUTES BEFORE A MEAL   pravastatin 40 MG  tablet Commonly known as:  PRAVACHOL TAKE 1 TABLET EVERY DAY   RESTASIS 0.05 % ophthalmic emulsion Generic drug:  cycloSPORINE   sertraline 100 MG tablet Commonly known as:  ZOLOFT Take 1 tablet (100 mg total) by mouth daily.   traMADol 50 MG tablet Commonly known as:  ULTRAM Take 1 tablet (50 mg total) by mouth 2 (two) times daily as needed.   traZODone 100 MG tablet Commonly known as:  DESYREL Take 1 tablet (100 mg total) by mouth at bedtime.   Turmeric Curcumin 500 MG Caps Take 1,000 mg by mouth.   Vitamin B-12 1000 MCG Subl Place under  the tongue daily.   WOMENS MULTIVITAMIN PLUS PO Take 1 tablet by mouth daily.   XIIDRA 5 % Soln Generic drug:  Lifitegrast Reported on 04/12/2016       No results found for this or any previous visit (from the past 24 hour(s)). No results found.   ROS: Negative, with the exception of above mentioned in HPI   Objective:  BP 130/81 (BP Location: Right Arm, Patient Position: Sitting, Cuff Size: Normal)   Pulse 68   Temp 97.9 F (36.6 C)   Resp 20   Ht 5\' 6"  (1.676 m)   Wt 191 lb 12 oz (87 kg)   SpO2 98%   BMI 30.95 kg/m  Body mass index is 30.95 kg/m. Gen: Afebrile. No acute distress. Nontoxic in appearance, well developed, well nourished.  HENT: AT. [place].  MMM CV: RRR Neuro:  Normal gait. PERLA. EOMi. Alert. Oriented x3  Psych: Normal affect, dress and demeanor. Normal speech. Normal thought content and judgment.  Assessment/Plan: [name] [name] is a 71 y.o. female present for OV for  [name] [name] is a 71 y.o. female present for follow up on depression, anxiety and insomnia.  Insomnia - Doing well on trazodone, refills provided today for 6 months.  Depression with anxiety - Doing well on Cymbalta twice a day and Zoloft 100 mg daily. Refills provided for 6 months today.  - Discussed Xanax use for panic attacks, and safety profile. Patient is amenable to trying Ativan. DC Xanax. Ativan 0.5 mg-1 mg once for panic  attack.  Osteoporosis: Paperwork filled out today to attempt to get patient financial assistance for osteoporosis treatment. Unable to obtain assistance for prolia, now trying [employer] assistance.  - Recently severe GERD, oral regimens contraindicated.  electronically signed by:  Howard Pouch, DO  [place]

## 2016-10-19 ENCOUNTER — Encounter (HOSPITAL_COMMUNITY): Payer: Self-pay

## 2016-10-19 NOTE — Patient Instructions (Signed)
[name] [name]  10/19/2016   Your procedure is scheduled on: 10-25-2016  Report to [facility] Main  Entrance take [facility]  elevators to 3rd floor to  [place] at [phone].  Call this number if you have problems the morning of surgery [phone]   Remember: ONLY 1 PERSON MAY GO WITH YOU TO SHORT STAY TO GET  READY MORNING OF YOUR SURGERY.  Do not eat food or drink liquids :After Midnight.     Take these medicines the morning of surgery with A SIP OF WATER: Atenolol(Tenormin), Omeprazole(Prilosec),  Duloxetine(Cymbalta), Dicyclomine(Bentyl), Cetrizine(Zyrtec), Sertraline(Zoloft), Tylenol as needed                                You may not have any metal on your body including hair pins and              piercings  Do not wear jewelry, make-up, lotions, powders or perfumes, deodorant             Do not wear nail polish.  Do not shave  48 hours prior to surgery.              Men may shave face and neck.   Do not bring valuables to the hospital. [place].  Contacts, dentures or bridgework may not be worn into surgery.  Leave suitcase in the car. After surgery it may be brought to your room.               Please read over the following fact sheets you were given: _____________________________________________________________________             [facility] - Preparing for Surgery Before surgery, you can play an important role.  Because skin is not sterile, your skin needs to be as free of germs as possible.  You can reduce the number of germs on your skin by washing with CHG (chlorahexidine gluconate) soap before surgery.  CHG is an antiseptic cleaner which kills germs and bonds with the skin to continue killing germs even after washing. Please DO NOT use if you have an allergy to CHG or antibacterial soaps.  If your skin becomes reddened/irritated stop using the CHG and inform your nurse when you arrive at  Short Stay. Do not shave (including legs and underarms) for at least 48 hours prior to the first CHG shower.  You may shave your face/neck. Please follow these instructions carefully:  1.  Shower with CHG Soap the night before surgery and the  morning of Surgery.  2.  If you choose to wash your hair, wash your hair first as usual with your  normal  shampoo.  3.  After you shampoo, rinse your hair and body thoroughly to remove the  shampoo.                           4.  Use CHG as you would any other liquid soap.  You can apply chg directly  to the skin and wash                       Gently with a scrungie or clean washcloth.  5.  Apply the CHG Soap to your body ONLY FROM THE NECK DOWN.   Do not use on face/ open                           Wound or open sores. Avoid contact with eyes, ears mouth and genitals (private parts).                       Wash face,  Genitals (private parts) with your normal soap.             6.  Wash thoroughly, paying special attention to the area where your surgery  will be performed.  7.  Thoroughly rinse your body with warm water from the neck down.  8.  DO NOT shower/wash with your normal soap after using and rinsing off  the CHG Soap.                9.  Pat yourself dry with a clean towel.            10.  Wear clean pajamas.            11.  Place clean sheets on your bed the night of your first shower and do not  sleep with pets. Day of Surgery : Do not apply any lotions/deodorants the morning of surgery.  Please wear clean clothes to the hospital/surgery center.  FAILURE TO FOLLOW THESE INSTRUCTIONS MAY RESULT IN THE CANCELLATION OF YOUR SURGERY PATIENT SIGNATURE_________________________________  NURSE SIGNATURE__________________________________  ________________________________________________________________________

## 2016-10-20 ENCOUNTER — Encounter (HOSPITAL_COMMUNITY): Payer: Self-pay | Admitting: *Deleted

## 2016-10-21 ENCOUNTER — Encounter (HOSPITAL_COMMUNITY)
Admission: RE | Admit: 2016-10-21 | Discharge: 2016-10-21 | Disposition: A | Discharge status: Home / Self Care | Payer: Medicare HMO | Source: Ambulatory Visit | Attending: Urology | Admitting: Urology

## 2016-10-22 NOTE — H&P (Signed)
Office Visit Report     08/31/2016   --------------------------------------------------------------------------------   [name] [name]  MRN: [id number]  PRIMARY CARE:  Shawnie Dapper, MD  DOB: [birth year], 71 year old Female  REFERRING:  Takeysha Bonk I. Gaynelle Arabian, [place]  PROVIDER:  Carolan Clines, M.D.    LOCATION:  [facility] [phone]   --------------------------------------------------------------------------------   CC: I have mixed incontinence.  HPI: [name] [name] is a 71 year-old female established patient who is here for mixed incontinence.  She does have problems getting to the bathroom in time after she has the urge to urinate. She has had an accident when she couldn't get to the bathroom in time . Her urge incontinence began 08/03/2002. She does leak urine when she coughs, laughs, sneezes or bears down. Her symptoms have not gotten worse over the last year. She does wear protective pads. She wears 1-2 pads per day.   She generally urinates every 4 hours in the daytime. She does not have an abnormal sensation when needing to urinate. She is not having problems with emptying her bladder well. She does have a good size and strength to her urinary stream. Her urinary stream does not start and stop during voiding.   She has had 2 pregnancies. Her method of delivery was vaginal. Patient denies caesarean.   She has failed myrbetriq. She is status post anterior vault suspension with Kelly plication and sacrospinous ligament fixation w/Xenform bovine dermis graft augmentation, urethral exploration w/urethral lysis, explantation of migrated TVT tape, implantation of FloSeal, implantation of Xenform bovine graft in periurethral space 08/27/13. She complains of leaking with coughing, laughing, movements, and urge. She underwent Macroplastique on 12/17/13.      ALLERGIES: Morphine Derivatives Sulfa Drugs    MEDICATIONS: Aspirin Ec 81 mg tablet, delayed release   Atenolol 25 MG Oral Tablet Oral  Calcium Carbonate 600 mg calcium (1,500 mg) tablet  Cyclobenzaprine HCl - 5 MG Oral Tablet Oral  Cymbalta 60 mg capsule,delayed release  Dicyclomine Hcl 10 mg capsule  Fish Oil  Folic Acid  Ginger Root  Ipratropium Bromide  Iron  Lisinopril-Hydrochlorothiazide 10-12.5 MG Oral Tablet Oral  Multivitamin  Omeprazole 20 MG Oral Capsule Delayed Release Oral  Pravastatin Sodium 40 MG Oral Tablet Oral  Restasis Multidose 0.05 % drops  Sertraline HCl - 100 MG Oral Tablet Oral  Tramadol Hcl 50 mg tablet  Trazodone Hcl 100 mg tablet  Turmeric CAPS Oral  Vitamin B-12 1000 MCG/ML SOLN Injection  Vitamin D3 1,000 unit tablet  Voltaren GEL 0 Transdermal  Xiidra 5 % dropperette, single-use drop dispenser     GU PSH: Bladder Repair - 2014 Complex cystometrogram, w/ void pressure and urethral pressure profile studies, any technique - 08/10/2016 Complex Uroflow - 08/10/2016 Cystoscopy Collagen Injection - 2015 Emg surf Electrd - 08/10/2016 Hysterectomy Unilat SO - 2014 Inject For cystogram - 08/10/2016 Insert Mesh/pelvic Flr Addon - 2014 Intrabd voidng Press - 08/10/2016 Repair Vaginal Prolapse - 2014 Revise Urethra - 2014      PSH Notes: Enteroscopic Procedures, Neck Surgery   NON-GU PSH: Diagnostic Colonoscopy - 2014 Tubal Ligation - 2014    GU PMH: Mixed incontinence - 08/03/2016, Urge and stress incontinence, - 2016 Urinary Tract Inf, Unspec site, Urinary tract infection - 03/10/2015, Pyuria, - 2014 Cystocele, midline, Cystocele, midline - 03/05/2015 Recurrent Cystitis w/o hematuria, Chronic cystitis - 03/05/2015 Bladder, Flaccid neuropathic, Hypotonic bladder - 2015 Stress Incontinence, M/F, Female stress incontinence - 2015 Gross hematuria, Gross hematuria - 2014  PMH Notes:  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: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   NON-GU PMH: Muscle weakness (generalized), Muscle weakness - 2016 Other lack of coordination, Other lack of coordination -  2016 Other muscle spasm, Muscle spasm - 2016 Unspecified dyspareunia, Dyspareunia, female - 2015 Encounter for general adult medical examination without abnormal findings, Encounter for preventive health examination - 2015 Nausea with vomiting, unspecified, Nausea with vomiting - 2015 Anxiety, Anxiety (Symptom) - 2014 Fibromyalgia, Fibromyalgia - 2014 Gastric ulcer, unspecified as acute or chronic, without hemorrhage or perforation, Gastric Ulcer - 2014 Irritable bowel syndrome with diarrhea, Irritable Bowel Syndrome - 2014 Personal history of other diseases of the circulatory system, History of hypertension - 2014 Personal history of other diseases of the digestive system, History of gastritis - 2014 Personal history of other endocrine, nutritional and metabolic disease, History of hyperlipidemia - 2014 Personal history of other mental and behavioral disorders, History of depression - 2014 Vitamin D deficiency, unspecified, Vitamin D Deficiency - 2014    FAMILY HISTORY: Blood In Urine - Mother Family Health Status - Father alive at age 90 - Father Family Health Status - Mother's Age - Mother Family Health Status Number - Runs In Family fibromyalgia - Sister Heart Disease - Father nephrolithiasis - Sister renal failure - Sister Tuberculosis - Runs In Family   SOCIAL HISTORY: Marital Status: Divorced Current Smoking Status: Patient does not smoke anymore. Has not smoked since 07/04/2009.  Drinks 2 caffeinated drinks per day. Patient's occupation is/was Retired.    REVIEW OF SYSTEMS:    GU Review Female:   Patient reports hard to postpone urination, get up at night to urinate, leakage of urine, and stream starts and stops. Patient denies frequent urination, burning /pain with urination, trouble starting your stream, have to strain to urinate, and currently pregnant.  Gastrointestinal (Upper):   Patient reports indigestion/ heartburn. Patient denies nausea and vomiting.   Gastrointestinal (Lower):   Patient reports constipation. Patient denies diarrhea.  Constitutional:   Patient reports night sweats and fatigue. Patient denies fever and weight loss.  Skin:   Patient denies skin rash/ lesion and itching.  Eyes:   Patient denies blurred vision and double vision.  Ears/ Nose/ Throat:   Patient reports sinus problems. Patient denies sore throat.  Hematologic/Lymphatic:   Patient denies swollen glands and easy bruising.  Cardiovascular:   Patient denies leg swelling and chest pains.  Respiratory:   Patient denies cough and shortness of breath.  Endocrine:   Patient denies excessive thirst.  Musculoskeletal:   Patient reports back pain and joint pain.   Neurological:   Patient reports dizziness. Patient denies headaches.  Psychologic:   Patient reports depression and anxiety.    VITAL SIGNS:      08/31/2016 04:09 PM  BP 106/67 mmHg  Pulse 75 /min  Temperature 98.1 F / 37 C   GU PHYSICAL EXAMINATION:    External Genitalia: No hirsutism, no rash, no scarring, no cyst, no erythematous lesion, no papular lesion, no blanched lesion, no warty lesion. No edema.  Urethral Meatus: Normal size. Normal position. No discharge. + Marshall and Q-tip test  Urethra: No urethral hypermobility. No tenderness, no mass, no scarring. No leakage.   Vagina: Mild vaginal atrophy. Third-degree cystocele, large. No stenosis. No rectocele. No enterocele.   Cervix: S/P Hysterectomy.   Uterus: S/P Hysterectomy.    MULTI-SYSTEM PHYSICAL EXAMINATION:    Constitutional: Well-nourished. No physical deformities. Normally developed. Good grooming.  Neck: Neck symmetrical, not swollen. Normal tracheal position.  Respiratory: No  labored breathing, no use of accessory muscles.   Cardiovascular: Normal temperature, normal extremity pulses, no swelling, no varicosities.  Lymphatic: No enlargement of neck, axillae, groin.  Skin: No paleness, no jaundice, no cyanosis. No lesion, no ulcer, no  rash.  Neurologic / Psychiatric: Oriented to time, oriented to place, oriented to person. No depression, no anxiety, no agitation.  Gastrointestinal: No mass, no tenderness, no rigidity, non obese abdomen.  Eyes: Normal conjunctivae. Normal eyelids.  Ears, Nose, Mouth, and Throat: Left ear no scars, no lesions, no masses. Right ear no scars, no lesions, no masses. Nose no scars, no lesions, no masses. Normal hearing. Normal lips.  Musculoskeletal: Normal gait and station of head and neck.     PAST DATA REVIEWED:  Source Of History:  Patient  Records Review:   Previous Patient Records  Urodynamics Review:   Review Urodynamics Tests   PROCEDURES:          Urinalysis Dipstick Dipstick Cont'd  Color: Yellow Bilirubin: Neg  Appearance: Clear Ketones: Neg  Specific Gravity: 1.025 Blood: Neg  pH: 5.5 Protein: Neg  Glucose: Neg Urobilinogen: 0.2    Nitrites: Neg    Leukocyte Esterase: Neg    ASSESSMENT:      ICD-10 Details  1 GU:   Mixed incontinence - N39.46           Notes:   Silke is a 71 year old female, with a history of TVT tape placement in 2005 per GYN, for stress urinary incontinence, of 2 years duration. She did get relief for 2 years, but then noted the onset of recurrent urinary tract infections, and eventual onset of recurrent incontinence, necessitating the removal of the migrated TVT tape, with insertion of biologic tissue under the urethra. She had follow-up physical therapy for her stress urinary incontinence. In addition, she has now had Macroplastique for her urinary incontinence. She is status post anterior vault suspension with bovine graft, apical sacrospinous fixation as well. She complains of feeling a vaginal bulge at the opening of the vagina, and complains of mixed stress and urge incontinence. Her examination shows a grade 3 cystocele, without rectocele. The apex appears elevated. Her urethra is not hypermobile. After examination, she was felt to need mesh repair of  her anterior vault, and may need additional therapy for her urethra, possibly Botox for her overactive bladder.   Urodynamics on 08/10/16, showed first sensation at 189 cc, with normal desire at 267 cc, strong desire at 268 cc. At 190 cc, the patient has a 4 cm bladder pressure contraction, with moderate urinary leakage occurring.   The cough leak point pressure at 150 cc is 34 cm of water pressure with moderate leakage. The Valsalva leak point pressure at 150 cc is 81 cm of water pressure with moderate leakage. The second Valsalva leak point pressure at 200 cc is 57 L of water pressure with moderate leakage and the cough leak point pressure at 200 cc is 41 cm of water with moderate leakage.   The pressure flow study shows that the patient has involuntary bladder contraction, with a voided volume of 2 45 cc maximum flow rate 11 cc/s. The detrusor pressure at maximum flow is 4 cm of water pressure, maximum detrusor pressure of 9 cm of water pressure. The post void residual is 25 cc.   The patient has a maximum bladder capacity of 270 cc. She has stress urinary incontinence, with moderate leakage. She has mild instability with leakage. She is coached to void without straining.  She can generate a voluntary contraction and empties well, with only 25 cc residual. She has trabeculation, but no reflux. Her bladder does not descend more than 2-3 cm.      After long discussion with Arcely today, we have considered various approaches to her incontinence and her weakened pelvic floor. She could have a Blavais sling, which would entail more surgical dissection than she would like ( using biologic tissue which will eventually dissolve-or her own fascia, which will also dissolve)-, and we have decided to consider a single incision sling with anterior vault Coloplast tissue sling-to try to minimize her mesh exposure. Note that she has a very weak detrusor contraction, but it may improve in the future with the resistance of  a sling in place.The Coloplast tissue should last a year prior to dissolving, and this should give her a good chance to scar her anterior vault in place.  I will discuss this with Dr. Matilde Sprang prior to surgery.  ( Time 50 minutes)      PLAN:           Schedule Return Visit: Next Available Appointment - Schedule Surgery          Document Letter(s):  Created for Patient: Clinical Summary        The information contained in this medical record document is considered private and confidential patient information. This information can only be used for the medical diagnosis and/or medical services that are being provided by the patient's selected caregivers. This information can only be distributed outside of the patient's care if the patient agrees and signs waivers of authorization for this information to be sent to an outside source or route.

## 2016-10-25 ENCOUNTER — Encounter (HOSPITAL_COMMUNITY)
Admission: RE | Disposition: A | Discharge status: Home / Self Care | Payer: Self-pay | Source: Ambulatory Visit | Attending: Urology

## 2016-10-25 ENCOUNTER — Encounter (HOSPITAL_COMMUNITY): Payer: Self-pay | Admitting: *Deleted

## 2016-10-25 ENCOUNTER — Ambulatory Visit (HOSPITAL_COMMUNITY): Payer: Medicare HMO | Admitting: Registered Nurse

## 2016-10-25 ENCOUNTER — Observation Stay (HOSPITAL_COMMUNITY)
Admission: RE | Admit: 2016-10-25 | Discharge: 2016-10-26 | Disposition: A | Discharge status: Home / Self Care | Payer: Medicare HMO | Source: Ambulatory Visit | Attending: Urology | Admitting: Urology

## 2016-10-25 DIAGNOSIS — N393 Stress incontinence (female) (male): Secondary | ICD-10-CM | POA: Diagnosis not present

## 2016-10-25 DIAGNOSIS — N3641 Hypermobility of urethra: Secondary | ICD-10-CM | POA: Insufficient documentation

## 2016-10-25 DIAGNOSIS — N3946 Mixed incontinence: Secondary | ICD-10-CM | POA: Diagnosis not present

## 2016-10-25 DIAGNOSIS — Z87891 Personal history of nicotine dependence: Secondary | ICD-10-CM | POA: Insufficient documentation

## 2016-10-25 DIAGNOSIS — K219 Gastro-esophageal reflux disease without esophagitis: Secondary | ICD-10-CM | POA: Insufficient documentation

## 2016-10-25 DIAGNOSIS — N8189 Other female genital prolapse: Principal | ICD-10-CM | POA: Insufficient documentation

## 2016-10-25 DIAGNOSIS — R69 Illness, unspecified: Secondary | ICD-10-CM | POA: Diagnosis not present

## 2016-10-25 DIAGNOSIS — I10 Essential (primary) hypertension: Secondary | ICD-10-CM | POA: Diagnosis not present

## 2016-10-25 DIAGNOSIS — Z79899 Other long term (current) drug therapy: Secondary | ICD-10-CM | POA: Insufficient documentation

## 2016-10-25 DIAGNOSIS — F418 Other specified anxiety disorders: Secondary | ICD-10-CM | POA: Diagnosis not present

## 2016-10-25 DIAGNOSIS — N3281 Overactive bladder: Secondary | ICD-10-CM | POA: Insufficient documentation

## 2016-10-25 DIAGNOSIS — R32 Unspecified urinary incontinence: Secondary | ICD-10-CM | POA: Diagnosis present

## 2016-10-25 DIAGNOSIS — G4700 Insomnia, unspecified: Secondary | ICD-10-CM | POA: Diagnosis not present

## 2016-10-25 DIAGNOSIS — J302 Other seasonal allergic rhinitis: Secondary | ICD-10-CM | POA: Diagnosis not present

## 2016-10-25 DIAGNOSIS — E785 Hyperlipidemia, unspecified: Secondary | ICD-10-CM | POA: Diagnosis not present

## 2016-10-25 DIAGNOSIS — N8111 Cystocele, midline: Secondary | ICD-10-CM | POA: Diagnosis not present

## 2016-10-25 HISTORY — DX: Major depressive disorder, single episode, unspecified: F32.9

## 2016-10-25 HISTORY — DX: Unspecified osteoarthritis, unspecified site: M19.90

## 2016-10-25 HISTORY — DX: Headache, unspecified: R51.9

## 2016-10-25 HISTORY — PX: PUBOVAGINAL SLING: SHX1035

## 2016-10-25 HISTORY — PX: VAGINAL PROLAPSE REPAIR: SHX830

## 2016-10-25 HISTORY — DX: Headache: R51

## 2016-10-25 HISTORY — DX: Depression, unspecified: F32.A

## 2016-10-25 HISTORY — DX: Other specified disorders of bone density and structure, unspecified site: M85.80

## 2016-10-25 LAB — BASIC METABOLIC PANEL
ANION GAP: 7 (ref 5–15)
Anion gap: 8 (ref 5–15)
BUN: 26 mg/dL — ABNORMAL HIGH (ref 6–20)
BUN: 24 mg/dL — AB (ref 6–20)
CALCIUM: 9.6 mg/dL (ref 8.9–10.3)
CALCIUM: 8.9 mg/dL (ref 8.9–10.3)
CHLORIDE: 106 mmol/L (ref 101–111)
CHLORIDE: 106 mmol/L (ref 101–111)
CO2: 28 mmol/L (ref 22–32)
CO2: 25 mmol/L (ref 22–32)
CREATININE: 1.06 mg/dL — AB (ref 0.44–1.00)
Creatinine, Ser: 0.8 mg/dL (ref 0.44–1.00)
GFR calc Af Amer: >60 mL/min (ref >60)
GFR calc non Af Amer: >60 mL/min (ref >60)
GFR calc non Af Amer: 52 mL/min — ABNORMAL LOW (ref >60)
GLUCOSE: 105 mg/dL — AB (ref 65–99)
GLUCOSE: 162 mg/dL — AB (ref 65–99)
Potassium: 3.6 mmol/L (ref 3.5–5.1)
Potassium: 3.3 mmol/L — ABNORMAL LOW (ref 3.5–5.1)
Sodium: 141 mmol/L (ref 135–145)
Sodium: 139 mmol/L (ref 135–145)

## 2016-10-25 LAB — CBC
HEMATOCRIT: 34.1 % — AB (ref 36.0–46.0)
HEMOGLOBIN: 10.9 g/dL — AB (ref 12.0–15.0)
MCH: 27.7 pg (ref 26.0–34.0)
MCHC: 32 g/dL (ref 30.0–36.0)
MCV: 86.5 fL (ref 78.0–100.0)
Platelets: 265 10*3/uL (ref 150–400)
RBC: 3.94 MIL/uL (ref 3.87–5.11)
RDW: 13 % (ref 11.5–15.5)
WBC: 7.8 10*3/uL (ref 4.0–10.5)

## 2016-10-25 LAB — HEMOGLOBIN AND HEMATOCRIT, BLOOD
HCT: 31.5 % — ABNORMAL LOW (ref 36.0–46.0)
Hemoglobin: 10.3 g/dL — ABNORMAL LOW (ref 12.0–15.0)

## 2016-10-25 SURGERY — CREATION, PUBOVAGINAL SLING
Anesthesia: General

## 2016-10-25 MED ORDER — POLYMYXIN B-TRIMETHOPRIM 10000-0.1 UNIT/ML-% OP SOLN
1.0000 [drp] | Freq: Three times a day (TID) | OPHTHALMIC | Status: AC
  Administered 2016-10-25 – 2016-10-26 (×3): 1 [drp] via OPHTHALMIC
  Filled 2016-10-25: qty 10

## 2016-10-25 MED ORDER — BUPIVACAINE HCL (PF) 0.5 % IJ SOLN
INTRAMUSCULAR | Status: AC
  Filled 2016-10-25: qty 30

## 2016-10-25 MED ORDER — CEFAZOLIN SODIUM-DEXTROSE 2-4 GM/100ML-% IV SOLN
INTRAVENOUS | Status: AC
  Filled 2016-10-25: qty 100

## 2016-10-25 MED ORDER — LIDOCAINE-EPINEPHRINE (PF) 1 %-1:200000 IJ SOLN
INTRAMUSCULAR | Status: AC
  Filled 2016-10-25: qty 60

## 2016-10-25 MED ORDER — KCL IN DEXTROSE-NACL 20-5-0.45 MEQ/L-%-% IV SOLN
INTRAVENOUS | Status: DC
  Administered 2016-10-25 – 2016-10-26 (×2): via INTRAVENOUS
  Filled 2016-10-25 (×3): qty 1000

## 2016-10-25 MED ORDER — FENTANYL CITRATE (PF) 100 MCG/2ML IJ SOLN
INTRAMUSCULAR | Status: DC | PRN
  Administered 2016-10-25: 50 ug via INTRAVENOUS
  Administered 2016-10-25 (×3): 25 ug via INTRAVENOUS

## 2016-10-25 MED ORDER — EPHEDRINE SULFATE-NACL 50-0.9 MG/10ML-% IV SOSY
PREFILLED_SYRINGE | INTRAVENOUS | Status: DC | PRN
  Administered 2016-10-25 (×3): 5 mg via INTRAVENOUS

## 2016-10-25 MED ORDER — DOCUSATE SODIUM 100 MG PO CAPS
100.0000 mg | ORAL_CAPSULE | Freq: Two times a day (BID) | ORAL | Status: DC
  Administered 2016-10-25 – 2016-10-26 (×2): 100 mg via ORAL
  Filled 2016-10-25 (×2): qty 1

## 2016-10-25 MED ORDER — OXYCODONE-ACETAMINOPHEN 5-325 MG PO TABS: 1.0000 | ORAL_TABLET | ORAL | 0 refills | Status: DC | PRN

## 2016-10-25 MED ORDER — TETRAHYDROZOLINE HCL 0.05 % OP SOLN: 2.0000 [drp] | Freq: Two times a day (BID) | OPHTHALMIC | Status: DC | PRN

## 2016-10-25 MED ORDER — CEFAZOLIN SODIUM-DEXTROSE 2-4 GM/100ML-% IV SOLN
2.0000 g | INTRAVENOUS | Status: AC
  Administered 2016-10-25: 2 g via INTRAVENOUS
  Filled 2016-10-25: qty 100

## 2016-10-25 MED ORDER — ESTRADIOL 0.1 MG/GM VA CREA
TOPICAL_CREAM | VAGINAL | Status: AC
  Filled 2016-10-25: qty 42.5

## 2016-10-25 MED ORDER — ONDANSETRON HCL 4 MG/2ML IJ SOLN: 4.0000 mg | Freq: Four times a day (QID) | INTRAMUSCULAR | Status: DC | PRN

## 2016-10-25 MED ORDER — KETOROLAC TROMETHAMINE 30 MG/ML IJ SOLN
INTRAMUSCULAR | Status: AC
  Filled 2016-10-25: qty 1

## 2016-10-25 MED ORDER — ESTRADIOL 0.1 MG/GM VA CREA
TOPICAL_CREAM | VAGINAL | Status: DC | PRN
  Administered 2016-10-25: 1 via VAGINAL

## 2016-10-25 MED ORDER — MIDAZOLAM HCL 5 MG/5ML IJ SOLN
INTRAMUSCULAR | Status: DC | PRN
  Administered 2016-10-25 (×2): 1 mg via INTRAVENOUS

## 2016-10-25 MED ORDER — ACETAMINOPHEN 500 MG PO TABS
ORAL_TABLET | ORAL | Status: AC
  Filled 2016-10-25: qty 2

## 2016-10-25 MED ORDER — ACETAMINOPHEN 500 MG PO TABS
1000.0000 mg | ORAL_TABLET | ORAL | Status: AC
  Administered 2016-10-25: 1000 mg via ORAL

## 2016-10-25 MED ORDER — TRAZODONE HCL 50 MG PO TABS
100.0000 mg | ORAL_TABLET | Freq: Every day | ORAL | Status: DC
  Administered 2016-10-25: 100 mg via ORAL
  Filled 2016-10-25: qty 2

## 2016-10-25 MED ORDER — LISINOPRIL-HYDROCHLOROTHIAZIDE 10-12.5 MG PO TABS: 1.0000 | ORAL_TABLET | Freq: Every day | ORAL | Status: DC

## 2016-10-25 MED ORDER — DEXAMETHASONE SODIUM PHOSPHATE 10 MG/ML IJ SOLN
INTRAMUSCULAR | Status: DC | PRN
  Administered 2016-10-25: 10 mg via INTRAVENOUS

## 2016-10-25 MED ORDER — SODIUM CHLORIDE 0.9 % IR SOLN
Status: AC
  Filled 2016-10-25: qty 500000

## 2016-10-25 MED ORDER — SUCCINYLCHOLINE CHLORIDE 200 MG/10ML IV SOSY
PREFILLED_SYRINGE | INTRAVENOUS | Status: AC
Start: 2016-10-25 — End: 2016-10-25
  Filled 2016-10-25: qty 10

## 2016-10-25 MED ORDER — SUGAMMADEX SODIUM 200 MG/2ML IV SOLN
INTRAVENOUS | Status: AC
  Filled 2016-10-25: qty 2

## 2016-10-25 MED ORDER — SUCCINYLCHOLINE CHLORIDE 200 MG/10ML IV SOSY
PREFILLED_SYRINGE | INTRAVENOUS | Status: DC | PRN
  Administered 2016-10-25: 130 mg via INTRAVENOUS

## 2016-10-25 MED ORDER — FLUORESCEIN SODIUM 10 % IV SOLN
INTRAVENOUS | Status: AC
  Filled 2016-10-25: qty 5

## 2016-10-25 MED ORDER — ACETAMINOPHEN 500 MG PO TABS
1000.0000 mg | ORAL_TABLET | Freq: Four times a day (QID) | ORAL | Status: AC
  Administered 2016-10-25 – 2016-10-26 (×4): 1000 mg via ORAL
  Filled 2016-10-25 (×4): qty 2

## 2016-10-25 MED ORDER — ONDANSETRON HCL 4 MG/2ML IJ SOLN
INTRAMUSCULAR | Status: DC | PRN
  Administered 2016-10-25: 4 mg via INTRAVENOUS

## 2016-10-25 MED ORDER — PANTOPRAZOLE SODIUM 40 MG PO TBEC
40.0000 mg | DELAYED_RELEASE_TABLET | Freq: Every day | ORAL | Status: DC
  Administered 2016-10-26: 40 mg via ORAL
  Filled 2016-10-25: qty 1

## 2016-10-25 MED ORDER — SUGAMMADEX SODIUM 200 MG/2ML IV SOLN
INTRAVENOUS | Status: DC | PRN
  Administered 2016-10-25: 180 mg via INTRAVENOUS

## 2016-10-25 MED ORDER — PRAVASTATIN SODIUM 40 MG PO TABS: 40.0000 mg | ORAL_TABLET | Freq: Every evening | ORAL | Status: DC

## 2016-10-25 MED ORDER — IPRATROPIUM BROMIDE 0.06 % NA SOLN: 2.0000 | Freq: Every day | NASAL | Status: DC | PRN

## 2016-10-25 MED ORDER — LIDOCAINE-EPINEPHRINE (PF) 1 %-1:200000 IJ SOLN
INTRAMUSCULAR | Status: DC | PRN
  Administered 2016-10-25: 38 mL

## 2016-10-25 MED ORDER — LORAZEPAM 0.5 MG PO TABS: 0.5000 mg | ORAL_TABLET | Freq: Every day | ORAL | Status: DC | PRN

## 2016-10-25 MED ORDER — HYDROMORPHONE HCL 1 MG/ML IJ SOLN
INTRAMUSCULAR | Status: AC
  Filled 2016-10-25: qty 1

## 2016-10-25 MED ORDER — ROCURONIUM BROMIDE 10 MG/ML (PF) SYRINGE
PREFILLED_SYRINGE | INTRAVENOUS | Status: DC | PRN
  Administered 2016-10-25: 40 mg via INTRAVENOUS

## 2016-10-25 MED ORDER — HYDROCHLOROTHIAZIDE 12.5 MG PO CAPS: 12.5000 mg | ORAL_CAPSULE | Freq: Every day | ORAL | Status: DC

## 2016-10-25 MED ORDER — FLUORESCEIN SODIUM 10 % IV SOLN
INTRAVENOUS | Status: DC | PRN
  Administered 2016-10-25: 50 mg via INTRAVENOUS

## 2016-10-25 MED ORDER — OXYCODONE HCL 5 MG PO TABS: 5.0000 mg | ORAL_TABLET | ORAL | Status: DC | PRN

## 2016-10-25 MED ORDER — PROPOFOL 10 MG/ML IV BOLUS
INTRAVENOUS | Status: AC
  Filled 2016-10-25: qty 20

## 2016-10-25 MED ORDER — MIDAZOLAM HCL 2 MG/2ML IJ SOLN
INTRAMUSCULAR | Status: AC
  Filled 2016-10-25: qty 2

## 2016-10-25 MED ORDER — SODIUM CHLORIDE 0.9 % IR SOLN
Status: DC | PRN
  Administered 2016-10-25: 500 mL

## 2016-10-25 MED ORDER — LIDOCAINE-EPINEPHRINE 0.5 %-1:200000 IJ SOLN
INTRAMUSCULAR | Status: AC
  Filled 2016-10-25: qty 1

## 2016-10-25 MED ORDER — SERTRALINE HCL 100 MG PO TABS
100.0000 mg | ORAL_TABLET | Freq: Every day | ORAL | Status: DC
  Administered 2016-10-26: 100 mg via ORAL
  Filled 2016-10-25: qty 1

## 2016-10-25 MED ORDER — HYDROMORPHONE HCL 1 MG/ML IJ SOLN
0.2500 mg | INTRAMUSCULAR | Status: DC | PRN
  Administered 2016-10-25 (×2): 0.25 mg via INTRAVENOUS

## 2016-10-25 MED ORDER — ROCURONIUM BROMIDE 50 MG/5ML IV SOSY
PREFILLED_SYRINGE | INTRAVENOUS | Status: AC
  Filled 2016-10-25: qty 5

## 2016-10-25 MED ORDER — LACTATED RINGERS IV SOLN
INTRAVENOUS | Status: DC
  Administered 2016-10-25: 12:00:00 via INTRAVENOUS

## 2016-10-25 MED ORDER — KETOROLAC TROMETHAMINE 0.5 % OP SOLN
1.0000 [drp] | Freq: Three times a day (TID) | OPHTHALMIC | Status: AC | PRN
  Administered 2016-10-25: 1 [drp] via OPHTHALMIC
  Filled 2016-10-25: qty 3

## 2016-10-25 MED ORDER — CYCLOBENZAPRINE HCL 5 MG PO TABS: 5.0000 mg | ORAL_TABLET | Freq: Two times a day (BID) | ORAL | Status: DC | PRN

## 2016-10-25 MED ORDER — BSS IO SOLN
15.0000 mL | Freq: Once | INTRAOCULAR | Status: AC
  Administered 2016-10-25: 15 mL
  Filled 2016-10-25: qty 15

## 2016-10-25 MED ORDER — ATENOLOL 25 MG PO TABS
25.0000 mg | ORAL_TABLET | Freq: Every morning | ORAL | Status: DC
  Administered 2016-10-26: 25 mg via ORAL
  Filled 2016-10-25: qty 1

## 2016-10-25 MED ORDER — PROPOFOL 10 MG/ML IV BOLUS
INTRAVENOUS | Status: DC | PRN
  Administered 2016-10-25: 150 mg via INTRAVENOUS

## 2016-10-25 MED ORDER — HYDROMORPHONE HCL 1 MG/ML IJ SOLN: 0.5000 mg | INTRAMUSCULAR | Status: DC | PRN

## 2016-10-25 MED ORDER — LISINOPRIL 10 MG PO TABS: 10.0000 mg | ORAL_TABLET | Freq: Every day | ORAL | Status: DC

## 2016-10-25 MED ORDER — SENNA 8.6 MG PO TABS
1.0000 | ORAL_TABLET | Freq: Two times a day (BID) | ORAL | Status: DC
  Administered 2016-10-25 – 2016-10-26 (×2): 8.6 mg via ORAL
  Filled 2016-10-25 (×2): qty 1

## 2016-10-25 MED ORDER — DULOXETINE HCL 60 MG PO CPEP
60.0000 mg | ORAL_CAPSULE | Freq: Two times a day (BID) | ORAL | Status: DC
  Administered 2016-10-25 – 2016-10-26 (×2): 60 mg via ORAL
  Filled 2016-10-25 (×2): qty 1

## 2016-10-25 MED ORDER — DEXAMETHASONE SODIUM PHOSPHATE 10 MG/ML IJ SOLN
INTRAMUSCULAR | Status: AC
  Filled 2016-10-25: qty 1

## 2016-10-25 MED ORDER — ONDANSETRON HCL 4 MG/2ML IJ SOLN
INTRAMUSCULAR | Status: AC
  Filled 2016-10-25: qty 2

## 2016-10-25 MED ORDER — OXYCODONE HCL 5 MG PO TABS: 5.0000 mg | ORAL_TABLET | Freq: Once | ORAL | Status: DC | PRN

## 2016-10-25 MED ORDER — OXYCODONE HCL 5 MG/5ML PO SOLN
5.0000 mg | Freq: Once | ORAL | Status: DC | PRN
  Filled 2016-10-25: qty 5

## 2016-10-25 MED ORDER — FENTANYL CITRATE (PF) 100 MCG/2ML IJ SOLN
INTRAMUSCULAR | Status: AC
Start: 2016-10-25 — End: 2016-10-25
  Filled 2016-10-25: qty 4

## 2016-10-25 MED ORDER — LIDOCAINE HCL (CARDIAC) 20 MG/ML IV SOLN
INTRAVENOUS | Status: DC | PRN
  Administered 2016-10-25: 100 mg via INTRAVENOUS

## 2016-10-25 MED ORDER — LACTATED RINGERS IV SOLN
INTRAVENOUS | Status: DC | PRN
  Administered 2016-10-25: 07:00:00 via INTRAVENOUS

## 2016-10-25 MED ORDER — VITAMIN D3 25 MCG (1000 UNIT) PO TABS
2000.0000 [IU] | ORAL_TABLET | Freq: Every day | ORAL | Status: DC
  Administered 2016-10-26: 2000 [IU] via ORAL
  Filled 2016-10-25: qty 2

## 2016-10-25 MED ORDER — KETOROLAC TROMETHAMINE 30 MG/ML IJ SOLN
INTRAMUSCULAR | Status: DC | PRN
  Administered 2016-10-25: 30 mg via INTRAVENOUS

## 2016-10-25 MED ORDER — ONDANSETRON HCL 4 MG/2ML IJ SOLN: 4.0000 mg | INTRAMUSCULAR | Status: DC | PRN

## 2016-10-25 MED ORDER — DICYCLOMINE HCL 10 MG PO CAPS
10.0000 mg | ORAL_CAPSULE | Freq: Three times a day (TID) | ORAL | Status: DC
  Administered 2016-10-25 – 2016-10-26 (×4): 10 mg via ORAL
  Filled 2016-10-25 (×4): qty 1

## 2016-10-25 MED ORDER — LORATADINE 10 MG PO TABS
10.0000 mg | ORAL_TABLET | Freq: Every day | ORAL | Status: DC
  Administered 2016-10-26: 10 mg via ORAL
  Filled 2016-10-25: qty 1

## 2016-10-25 MED ORDER — ACETAMINOPHEN 500 MG PO TABS: 1000.0000 mg | ORAL_TABLET | Freq: Four times a day (QID) | ORAL | Status: DC | PRN

## 2016-10-25 MED ORDER — LIDOCAINE 2% (20 MG/ML) 5 ML SYRINGE
INTRAMUSCULAR | Status: AC
  Filled 2016-10-25: qty 5

## 2016-10-25 MED ORDER — EPHEDRINE 5 MG/ML INJ
INTRAVENOUS | Status: AC
  Filled 2016-10-25: qty 10

## 2016-10-25 SURGICAL SUPPLY — 50 items
ADH SKN CLS APL DERMABOND .7 (GAUZE/BANDAGES/DRESSINGS)
ALLOGRAFT TIS AXIS TUTPLST 4X7 (Mesh General) IMPLANT
BAG DECANTER FOR FLEXI CONT (MISCELLANEOUS) ×1 IMPLANT
BAG URINE DRAINAGE (UROLOGICAL SUPPLIES) IMPLANT
BLADE HEX COATED 2.75 (ELECTRODE) IMPLANT
BLADE SURG 15 STRL LF DISP TIS (BLADE) ×1 IMPLANT
BLADE SURG 15 STRL SS (BLADE) ×3
BLADE SURG SZ10 CARB STEEL (BLADE) ×3 IMPLANT
CATH BONANNO SUPRAPUBIC 14G (CATHETERS) IMPLANT
CATH FOLEY 2WAY SLVR  5CC 18FR (CATHETERS) ×2
CATH FOLEY 2WAY SLVR 5CC 18FR (CATHETERS) ×1 IMPLANT
COVER MAYO STAND STRL (DRAPES) ×3 IMPLANT
COVER SURGICAL LIGHT HANDLE (MISCELLANEOUS) ×3 IMPLANT
DECANTER SPIKE VIAL GLASS SM (MISCELLANEOUS) ×1 IMPLANT
DERMABOND ADVANCED (GAUZE/BANDAGES/DRESSINGS)
DERMABOND ADVANCED .7 DNX12 (GAUZE/BANDAGES/DRESSINGS) IMPLANT
DRAIN PENROSE 18X1/2 LTX STRL (DRAIN) ×3 IMPLANT
DRAPE UTILITY 15X26 (DRAPE) ×3 IMPLANT
ELECT PENCIL ROCKER SW 15FT (MISCELLANEOUS) IMPLANT
ELECT REM PT RETURN 9FT ADLT (ELECTROSURGICAL)
ELECTRODE REM PT RTRN 9FT ADLT (ELECTROSURGICAL) IMPLANT
GLOVE BIOGEL M STRL SZ7.5 (GLOVE) ×9 IMPLANT
GOWN STRL REUS W/TWL XL LVL3 (GOWN DISPOSABLE) ×6 IMPLANT
KIT BASIN OR (CUSTOM PROCEDURE TRAY) ×3 IMPLANT
NEEDLE HYPO 22GX1.5 SAFETY (NEEDLE) ×3 IMPLANT
NS IRRIG 1000ML POUR BTL (IV SOLUTION) IMPLANT
PACK CYSTO (CUSTOM PROCEDURE TRAY) ×3 IMPLANT
PACKING VAGINAL (PACKING) ×2 IMPLANT
PLUG CATH AND CAP STER (CATHETERS) ×3 IMPLANT
RETRACTOR LONRSTAR 16.6X16.6CM (MISCELLANEOUS) ×1 IMPLANT
RETRACTOR STAY HOOK 5MM (MISCELLANEOUS) ×3 IMPLANT
RETRACTOR STER APS 16.6X16.6CM (MISCELLANEOUS) ×3
SCRUB TECHNI CARE 4 OZ NO DYE (MISCELLANEOUS) ×3 IMPLANT
SHEET LAVH (DRAPES) ×3 IMPLANT
SLING ALTIS SYSTEM (Sling) ×3 IMPLANT
SPONGE LAP 4X18 X RAY DECT (DISPOSABLE) ×9 IMPLANT
SUCTION FRAZIER HANDLE 12FR (TUBING) ×2
SUCTION TUBE FRAZIER 12FR DISP (TUBING) ×1 IMPLANT
SUT CAPIO ETHIBPND (SUTURE) IMPLANT
SUT VIC AB 2-0 UR5 27 (SUTURE) ×1 IMPLANT
SUT VIC AB 2-0 UR6 27 (SUTURE) ×16 IMPLANT
SUT VICRYL 0 UR6 27IN ABS (SUTURE) ×2 IMPLANT
SYR 10ML LL (SYRINGE) ×3 IMPLANT
TISSUE AXIS TUTOPLAST 4X7 (Mesh General) ×3 IMPLANT
TOWEL OR 17X26 10 PK STRL BLUE (TOWEL DISPOSABLE) ×3 IMPLANT
TOWEL OR NON WOVEN STRL DISP B (DISPOSABLE) ×3 IMPLANT
TUBING CONNECTING 10 (TUBING) ×2 IMPLANT
TUBING CONNECTING 10' (TUBING) ×1
WATER STERILE IRR 1500ML POUR (IV SOLUTION) IMPLANT
YANKAUER SUCT BULB TIP 10FT TU (MISCELLANEOUS) ×3 IMPLANT

## 2016-10-25 NOTE — Anesthesia Procedure Notes (Signed)
Procedure Name: Intubation Date/Time: 10/25/2016 7:43 AM Performed by: Carleene Cooper A Pre-anesthesia Checklist: Patient identified, Timeout performed, Emergency Drugs available, Suction available and Patient being monitored Patient Re-evaluated:Patient Re-evaluated prior to inductionOxygen Delivery Method: Circle system utilized Preoxygenation: Pre-oxygenation with 100% oxygen Intubation Type: IV induction Ventilation: Mask ventilation without difficulty Laryngoscope Size: Mac and 4 Grade View: Grade I Tube type: Oral Tube size: 7.5 mm Number of attempts: 1 Airway Equipment and Method: Stylet Placement Confirmation: ETT inserted through vocal cords under direct vision,  positive ETCO2 and breath sounds checked- equal and bilateral Secured at: 21 cm Tube secured with: Tape Dental Injury: Teeth and Oropharynx as per pre-operative assessment

## 2016-10-25 NOTE — Op Note (Signed)
Operative Note:  Pre-operative diagnosis: Pelvic floor prolapse with urinary incontinence, with anterior vault prolapse, urethral hypermobility.  Postoperative diagnosis: Pelvic floor prolapse with urinary incontinence, with anterior vault prolapse, urethral hypermobility.  Operation: Anterior vault prolapse repair using dermal graft repair, and Altis midurethral sling.   * AUGMENTED ANTERIOR VAGINAL PROLAPSE REPAIR WITH AXIS DERMIS AND [place] ALTIS SLING - General  Surgeon:  Carolan Clines, MD  Resident: Virginia Crews, MD  Implants:  Implant Name Type Inv. Item Serial No. Manufacturer Lot No. LRB No. Used  TISSUE AXIS TUTOPLAST 4X7 - [id number] Mesh General TISSUE AXIS TUTOPLAST 4X7 [id number] COLOPLAST [id number] N/A 1  SLING ALTIS SYSTEM - [id number] Sling SLING [place] [id number] COLOPLAST [id number] N/A 1   Drains: 2 F foley catheter   Complications: None  Anesthesia:  General LMA  Preparation: After appropriate preanesthesia, the patient was brought to the operating room, placed on the operating table in the dorsal supine position. She received 80 mg of IV gentamicin, 1 g of IV Tylenol. Timeout was observed.  Indications: Please see prior H&P for history  Procedure: Inspection of the vaginal vault reveals a grade 3 cystocele with good apical support and no rectocele/enterocele.   The [facility] retractor is placed, and a 38F foley catheter was placed. Using a blue marking pen, the bladder neck is identified, the mid urethra was also identified and marked. We identified our intended area of incision for the cystocele repair which in the midline was approximately 1 cm proximal to the urethrovaginal junction. This is marked with a semi-lunar horizontal line with a blue pen. 30 cc of lidocaine 1% with epinephrine 1-200,000 diluted in normal saline is then injected  in the proposed incision site to serve as Hydro dissection to the level of the ischial spines, as well as for hemostasis.  Using a 15 blade, the horizontal semilunar incision was made. We sharply and bluntly dissected the overlying vaginal mucosa from the underlying pubocervical fascia to the white line bilaterally. This was noted to be scarred in due to her prior surgeries, making the dissection difficult.  This was dissected laterally to the level of the ischial spines, proximally to ~0.5 cm distal to the vaginal cuff and distally to the bladder neck. With the bladder empty we next identified and cleared off the ischial spines bilaterally with finger dissection. Next, we performed a standard Kelly plication, using interrupted 2-0 Vicryl sutures, placed in horizontal mattress fashion with excellent reduction of the cystocele. We did not shorten the anterior vaginal wall or plicate the bladder neck.   We then cut a 4 x 6 Coloplast Axis dermal graft in the shape of a rectangle.  The needle was then passed through the graft in each corresponding area using 0 vicryl suture and we then utilized a free needle to pass the second end of each suture through the graft as well so it would lie flat when we sutured it in place. The remainder of the anchoring sutures were tied down with excellent cosmetic effect and the graft sat in a flat position and was tension free.  I then cystoscoped the patient. There was no distortion of the anatomy or injury to the bladder or urethra. She had excellent yellow jets bilaterally. Following cystoscopy, Foley catheter was replaced.   We then excised excessive vaginal mucosa and closed the anterior vaginal wall running 2-0 Vicryls. She had excellent vaginal length.  We then turned our attention to placement of the Altis sling. The mid-urethra was  marked with a blue pen. 5 cc of Marcaine solution was injected bilaterally for hydrodissection. The vaginal incision over the mid urethra is  made with a 15 blade. Subcutaneous tissue is dissected, bilaterally. Dissection is carried out towards the pelvic sidewall towards the obturator membrane just inferior to the insertion point of the adductor longus tendon. This was also noted to be very scarred in due to prior surgeries and urethral bulking agent injection. We did not encounter her old mesh in our dissection, likely because we were well off of the urethra.  The Altis sling is secured to the passing trocar in standard fashion first on the right side and then the second dynamic tensioning side was also passed with the left sided trocar. We examined the sling and noted that the sling sat in an appropriate flat submucosal position. We examined both vaginal fornices and confirmed this had been properly placed.    Next, Irrigation of the vagina was accomplished and we re-filled the bladder and tensioned the sling via the dynamic tensioning sling while testing valsalva. We tensioned it just enough to prevent stress urinary incontinence with moderate valsalva with a moderately full bladder. We assessed the sling and it was smooth against the urethra, but not tight. We were able to pass the end of a right angle between the urethral and sling without resistance.   I then cystoscoped the patient. There was no distortion of the anatomy or injury to the bladder or urethra. She had excellent yellow jets bilaterally. Following cystoscopy, Foley catheter was replaced.    Minimal bleeding was noted.  We then closed the midline vertical vaginal incision making sure not to incorporate the sling with a single running 2-0 Vicryl suture.  Vaginal packing was placed using estrogen and a vaginal packing, and Foley was left to straight drainage. The patient received IV Tylenol, was awakened, and taken to recovery room in good condition.  Attestation: Dr. Gaynelle Arabian was present and scrubbed for the entirety of the procedure.

## 2016-10-25 NOTE — Transfer of Care (Signed)
Immediate Anesthesia Transfer of Care Note  Patient: [name] [name]  Procedure(s) Performed: Procedure(s): PUBO-VAGINAL SLING (N/A) VAGINAL VAULT SUSPENSION with mesh and sacrospinous repair (N/A)  Patient Location: PACU  Anesthesia Type:General  Level of Consciousness: awake, alert , oriented and patient cooperative  Airway & Oxygen Therapy: Patient Spontanous Breathing and Patient connected to face mask oxygen  Post-op Assessment: Report given to RN, Post -op Vital signs reviewed and stable and Patient moving all extremities  Post vital signs: Reviewed and stable  Last Vitals:  Vitals:   10/25/16 0535  BP: (!) 141/86  Pulse: 67  Resp: 18  Temp: 36.6 C    Last Pain:  Vitals:   10/25/16 0535  TempSrc: Oral      Patients Stated Pain Goal: 4 ([id number])  Complications: No apparent anesthesia complications

## 2016-10-25 NOTE — Addendum Note (Signed)
Addendum  created 10/25/16 1358 by Duane Boston, MD   Order list changed, Order sets accessed, Sign clinical note

## 2016-10-25 NOTE — Interval H&P Note (Signed)
History and Physical Interval Note:  10/25/2016 7:11 AM  [name] [name]  has presented today for surgery, with the diagnosis of stress incontinece  The various methods of treatment have been discussed with the patient and family. After consideration of risks, benefits and other options for treatment, the patient has consented to  Procedure(s): PUBO-VAGINAL SLING (N/A) VAGINAL VAULT SUSPENSION with mesh and sacrospinous repair (N/A) as a surgical intervention .  The patient's history has been reviewed, patient examined, no change in status, stable for surgery.  I have reviewed the patient's chart and labs.  Questions were answered to the patient's satisfaction.    She will have intra-operative evaluation, and proposed Coloplast mesh anterior vault suspension/apical suspension and Altis sling today. She understands post-operative care.  [name] I [name]

## 2016-10-25 NOTE — Anesthesia Preprocedure Evaluation (Addendum)
Anesthesia Evaluation  Patient identified by MRN, date of birth, ID band Patient awake    Reviewed: Allergy & Precautions, H&P , NPO status , Patient's Chart, lab work & pertinent test results  History of Anesthesia Complications (+) PONV and history of anesthetic complications  Airway Mallampati: II   Neck ROM: full    Dental   Pulmonary former smoker,    breath sounds clear to auscultation       Cardiovascular hypertension, + Peripheral Vascular Disease   Rhythm:regular Rate:Normal     Neuro/Psych  Headaches, PSYCHIATRIC DISORDERS Anxiety Depression  Neuromuscular disease    GI/Hepatic GERD  ,  Endo/Other    Renal/GU      Musculoskeletal  (+) Arthritis , Fibromyalgia -  Abdominal   Peds  Hematology   Anesthesia Other Findings   Reproductive/Obstetrics                             Anesthesia Physical Anesthesia Plan  ASA: II  Anesthesia Plan: General   Post-op Pain Management:    Induction: Intravenous  Airway Management Planned: Oral ETT  Additional Equipment:   Intra-op Plan:   Post-operative Plan: Extubation in OR  Informed Consent: I have reviewed the patients History and Physical, chart, labs and discussed the procedure including the risks, benefits and alternatives for the proposed anesthesia with the patient or authorized representative who has indicated his/her understanding and acceptance.     Plan Discussed with: CRNA, Anesthesiologist and Surgeon  Anesthesia Plan Comments:         Anesthesia Quick Evaluation

## 2016-10-25 NOTE — Anesthesia Postprocedure Evaluation (Addendum)
Anesthesia Post Note  Patient: [name] [name]  Procedure(s) Performed: Procedure(s) (LRB): PUBO-VAGINAL SLING (N/A) VAGINAL VAULT SUSPENSION with mesh and sacrospinous repair (N/A)  Patient location during evaluation: PACU Anesthesia Type: General Level of consciousness: awake and alert and patient cooperative Pain management: pain level controlled Vital Signs Assessment: post-procedure vital signs reviewed and stable Respiratory status: spontaneous breathing and respiratory function stable Cardiovascular status: stable Anesthetic complications: yes Anesthetic complication details: injury of corneaComments: Pt c/o pain in Right eye.  Exam does not show any obvious FB or injury. Probable corneal abrasion.  Orders given.       Last Vitals:  Vitals:   10/25/16 1215 10/25/16 1230  BP: 118/65 103/60  Pulse: 90 91  Resp: 16 15  Temp:      Last Pain:  Vitals:   10/25/16 1215  TempSrc:   PainSc: 0-No pain                 HODIERNE,ADAM S

## 2016-10-25 NOTE — Discharge Summary (Signed)
Date of admission: 10/25/2016  Date of discharge: 10/26/2016  Admission diagnosis: Pelvic floor prolapse with urinary incontinence, with anterior vault prolapse, urethral hypermobility  Discharge diagnosis: Pelvic floor prolapse with urinary incontinence, with anterior vault prolapse, urethral hypermobility  Secondary diagnoses: none  History and Physical: For full details, please see admission history and physical. Briefly, [name] [name] is a 71 y.o. year old patient with grade 3 cystocele and stress urinary incontinence.   Hospital Course: 71 yo female who is s/p cytocele repair, mesh urethral sling placement with Dr. Zadie Cleverly on 10/25/16. She did well post-operatively. Her diet was slowly advanced and at the time of discharge she was tolerating a regular diet, ambulating at her baseline, was voiding spontaneously after foley catheter removal, vaginal packing had been removed and pain was well controlled with oral narcotics. She was discharged to home on POD#1.  Laboratory values:   Recent Labs  10/25/16 0553 10/25/16 1218 10/26/16 0555  HGB 10.9* 10.3* 9.1*  HCT 34.1* 31.5* 28.0*    Recent Labs  10/25/16 1218 10/26/16 0555  CREATININE 1.06* 1.03*    Disposition: Home  Discharge instruction: The patient was instructed to be ambulatory but told to refrain from heavy lifting, strenuous activity, or driving.    Discharge medications:  Allergies as of 10/26/2016      Reactions   Morphine Nausea And Vomiting   SEVERE   Sulfa Antibiotics Swelling      Medication List    STOP taking these medications   aspirin 81 MG tablet     TAKE these medications   acetaminophen 500 MG tablet Commonly known as:  TYLENOL Take 1,000 mg by mouth every 6 (six) hours as needed for headache.   atenolol 25 MG tablet Commonly known as:  TENORMIN TAKE 1 TABLET EVERY MORNING   calcium carbonate 600 MG Tabs tablet Commonly known as:  OS-CAL Take 600 mg by mouth daily.   cetirizine 10  MG tablet Commonly known as:  ZYRTEC Take 10 mg by mouth daily as needed for allergies.   cholecalciferol 1000 units tablet Commonly known as:  VITAMIN D Take 2 tablets (2,000 Units total) by mouth daily.   cyclobenzaprine 5 MG tablet Commonly known as:  FLEXERIL Take 5 mg by mouth 2 (two) times daily as needed for muscle spasms.   diclofenac sodium 1 % Gel Commonly known as:  VOLTAREN Apply dime sized amount to finger 2-3 times daily. What changed:  how much to take  how to take this  when to take this  reasons to take this  additional instructions   dicyclomine 10 MG capsule Commonly known as:  BENTYL Take 1 capsule (10 mg total) by mouth 4 (four) times daily -  before meals and at bedtime.   DULoxetine 60 MG capsule Commonly known as:  CYMBALTA Take 1 capsule (60 mg total) by mouth 2 (two) times daily.   FISH OIL PO Take 1 capsule by mouth daily.   folic acid [id number] MCG tablet Commonly known as:  FOLVITE Take 800 mcg by mouth daily.   GINGER ROOT PO Take 1 tablet by mouth daily.   ipratropium 0.06 % nasal spray Commonly known as:  ATROVENT Place 2 sprays into both nostrils 4 (four) times daily. What changed:  when to take this  reasons to take this   lisinopril-hydrochlorothiazide 10-12.5 MG tablet Commonly known as:  PRINZIDE,ZESTORETIC TAKE 1 TABLET EVERY DAY   LORazepam 0.5 MG tablet Commonly known as:  ATIVAN Take 1-2 tablets (0.5-1  mg total) by mouth once as needed for anxiety (for panic attack). What changed:  how much to take  when to take this   omeprazole 20 MG capsule Commonly known as:  PRILOSEC TAKE 1 CAPSULE EVERY MORNING  30  MINUTES BEFORE A MEAL   oxyCODONE-acetaminophen 5-325 MG tablet Commonly known as:  ROXICET Take 1-2 tablets by mouth every 4 (four) hours as needed.   pravastatin 40 MG tablet Commonly known as:  PRAVACHOL TAKE 1 TABLET EVERY DAY What changed:  See the new instructions.   sertraline 100 MG  tablet Commonly known as:  ZOLOFT Take 1 tablet (100 mg total) by mouth daily.   traMADol 50 MG tablet Commonly known as:  ULTRAM Take 1 tablet (50 mg total) by mouth 2 (two) times daily as needed. What changed:  reasons to take this  additional instructions   traZODone 100 MG tablet Commonly known as:  DESYREL Take 1 tablet (100 mg total) by mouth at bedtime.   Turmeric Curcumin 500 MG Caps Take 1,000 mg by mouth daily.   VISINE OP Apply 2 drops to eye 2 (two) times daily as needed (DRY EYES).   Vitamin B-12 1000 MCG Subl Place 1 tablet under the tongue daily.   WOMENS MULTIVITAMIN PLUS PO Take 1 tablet by mouth daily.       Followup: She will follow up for post op check.

## 2016-10-26 ENCOUNTER — Encounter (HOSPITAL_COMMUNITY): Payer: Self-pay | Admitting: Urology

## 2016-10-26 DIAGNOSIS — N8189 Other female genital prolapse: Secondary | ICD-10-CM | POA: Diagnosis not present

## 2016-10-26 DIAGNOSIS — I10 Essential (primary) hypertension: Secondary | ICD-10-CM | POA: Diagnosis not present

## 2016-10-26 DIAGNOSIS — K219 Gastro-esophageal reflux disease without esophagitis: Secondary | ICD-10-CM | POA: Diagnosis not present

## 2016-10-26 DIAGNOSIS — N3946 Mixed incontinence: Secondary | ICD-10-CM | POA: Diagnosis not present

## 2016-10-26 DIAGNOSIS — N3281 Overactive bladder: Secondary | ICD-10-CM | POA: Diagnosis not present

## 2016-10-26 DIAGNOSIS — N3641 Hypermobility of urethra: Secondary | ICD-10-CM | POA: Diagnosis not present

## 2016-10-26 DIAGNOSIS — F418 Other specified anxiety disorders: Secondary | ICD-10-CM | POA: Diagnosis not present

## 2016-10-26 DIAGNOSIS — Z87891 Personal history of nicotine dependence: Secondary | ICD-10-CM | POA: Diagnosis not present

## 2016-10-26 DIAGNOSIS — Z79899 Other long term (current) drug therapy: Secondary | ICD-10-CM | POA: Diagnosis not present

## 2016-10-26 LAB — BASIC METABOLIC PANEL
Anion gap: 6 (ref 5–15)
BUN: 25 mg/dL — AB (ref 6–20)
CHLORIDE: 108 mmol/L (ref 101–111)
CO2: 26 mmol/L (ref 22–32)
CREATININE: 1.03 mg/dL — AB (ref 0.44–1.00)
Calcium: 8.9 mg/dL (ref 8.9–10.3)
GFR calc Af Amer: >60 mL/min (ref >60)
GFR calc non Af Amer: 54 mL/min — ABNORMAL LOW (ref >60)
GLUCOSE: 116 mg/dL — AB (ref 65–99)
POTASSIUM: 4.3 mmol/L (ref 3.5–5.1)
Sodium: 140 mmol/L (ref 135–145)

## 2016-10-26 LAB — HEMOGLOBIN AND HEMATOCRIT, BLOOD
HCT: 28 % — ABNORMAL LOW (ref 36.0–46.0)
Hemoglobin: 9.1 g/dL — ABNORMAL LOW (ref 12.0–15.0)

## 2016-10-26 MED ORDER — KETOROLAC TROMETHAMINE 15 MG/ML IJ SOLN: 15.0000 mg | Freq: Once | INTRAMUSCULAR | Status: DC

## 2016-10-26 MED ORDER — SODIUM CHLORIDE 0.9 % IV BOLUS (SEPSIS)
500.0000 mL | Freq: Once | INTRAVENOUS | Status: AC
  Administered 2016-10-26: 500 mL via INTRAVENOUS

## 2016-10-26 NOTE — Progress Notes (Signed)
1 Day Post-Op Subjective: The patient is doing well.  No nausea or vomiting. Pain is adequately controlled. Shaky when standing last night. Packing and foley removed this AM.  Objective: Vital signs in last 24 hours: Temp:  [97.8 F (36.6 C)-98.6 F (37 C)] 97.8 F (36.6 C) (01/23 0517) Pulse Rate:  [63-94] 63 (01/23 0517) Resp:  [13-18] 16 (01/23 0517) BP: (89-130)/(49-76) 93/53 (01/23 0517) SpO2:  [93 %-100 %] 95 % (01/23 0517)  Intake/Output from previous day: 01/22 0701 - 01/23 0700 In: 4500 [I.V.:4500] Out: 550 [Urine:475; Blood:75] Intake/Output this shift: No intake/output data recorded.  Physical Exam:  General: Alert and oriented. CV: RRR Lungs: Clear bilaterally. GI: Soft, Nondistended. Extremities: Nontender, no erythema, no edema.  Lab Results:  Recent Labs  10/25/16 0553 10/25/16 1218 10/26/16 0555  HGB 10.9* 10.3* 9.1*  HCT 34.1* 31.5* 28.0*          Recent Labs  10/25/16 0553 10/25/16 1218 10/26/16 0555  CREATININE 0.80 1.06* 1.03*           Results for orders placed or performed during the hospital encounter of 10/25/16 (from the past 24 hour(s))  Basic metabolic panel     Status: Abnormal   Collection Time: 10/25/16 12:18 PM  Result Value Ref Range   Sodium 139 135 - 145 mmol/L   Potassium 3.3 (L) 3.5 - 5.1 mmol/L   Chloride 106 101 - 111 mmol/L   CO2 25 22 - 32 mmol/L   Glucose, Bld 162 (H) 65 - 99 mg/dL   BUN 24 (H) 6 - 20 mg/dL   Creatinine, Ser 1.06 (H) 0.44 - 1.00 mg/dL   Calcium 8.9 8.9 - 10.3 mg/dL   GFR calc non Af Amer 52 (L) >60 mL/min   GFR calc Af Amer >60 >60 mL/min   Anion gap 8 5 - 15  Hemoglobin and hematocrit, blood     Status: Abnormal   Collection Time: 10/25/16 12:18 PM  Result Value Ref Range   Hemoglobin 10.3 (L) 12.0 - 15.0 g/dL   HCT 31.5 (L) 36.0 - [id number] %  Basic metabolic panel     Status: Abnormal   Collection Time: 10/26/16  5:55 AM  Result Value Ref Range   Sodium 140 135 - 145 mmol/L   Potassium 4.3  3.5 - 5.1 mmol/L   Chloride 108 101 - 111 mmol/L   CO2 26 22 - 32 mmol/L   Glucose, Bld 116 (H) 65 - 99 mg/dL   BUN 25 (H) 6 - 20 mg/dL   Creatinine, Ser 1.03 (H) 0.44 - 1.00 mg/dL   Calcium 8.9 8.9 - 10.3 mg/dL   GFR calc non Af Amer 54 (L) >60 mL/min   GFR calc Af Amer >60 >60 mL/min   Anion gap 6 5 - 15  Hemoglobin and hematocrit, blood     Status: Abnormal   Collection Time: 10/26/16  5:55 AM  Result Value Ref Range   Hemoglobin 9.1 (L) 12.0 - 15.0 g/dL   HCT 28.0 (L) 36.0 - 46.0 %    Assessment/Plan: POD# 1 s/p anterior repair with sling  1) Ambulate, Incentive spirometry 2) Transition to oral pain medication 3) D/c vaginal packing 4) D/C urethral catheter with trial of void 5) d/c today pending trial of void   LOS: 0 days   Pieter Partridge A Sukhu 10/26/2016, 7:22 AM

## 2016-10-26 NOTE — Care Management Note (Signed)
Case Management Note  Patient Details  Name: [name] [name] MRN: [id number] Date of Birth: November 25, 1945  Subjective/Objective:  71 y/o f admitted w/urinary incontinence. From home. No CM needs.                  Action/Plan:d/c home.   Expected Discharge Date:  10/26/16               Expected Discharge Plan:  Home/Self Care  In-House Referral:     Discharge planning Services  CM Consult  Post Acute Care Choice:    Choice offered to:     DME Arranged:    DME Agency:     HH Arranged:    HH Agency:     Status of Service:  Completed, signed off  If discussed at [employer] of Stay Meetings, dates discussed:    Additional Comments:  Dessa Phi, RN 10/26/2016, 10:33 AM

## 2016-10-26 NOTE — Progress Notes (Signed)
Pt had a b/p of 89/49. Paged on call. Rcv'd verbal order for a 500cc bolus of N/S per: A. Dancy.  [place]

## 2016-10-26 NOTE — Progress Notes (Signed)
Pt's rechecked b/p is 93/53 status post 500cc bolus of N/S. Will continue to monitor. [facility]

## 2016-10-27 ENCOUNTER — Other Ambulatory Visit: Payer: Self-pay | Admitting: Family Medicine

## 2016-11-02 DIAGNOSIS — N8111 Cystocele, midline: Secondary | ICD-10-CM | POA: Diagnosis not present

## 2016-11-03 ENCOUNTER — Telehealth: Payer: Self-pay | Admitting: Family Medicine

## 2016-11-03 DIAGNOSIS — M810 Age-related osteoporosis without current pathological fracture: Secondary | ICD-10-CM

## 2016-11-03 DIAGNOSIS — Q788 Other specified osteochondrodysplasias: Secondary | ICD-10-CM

## 2016-11-03 NOTE — Telephone Encounter (Signed)
The letter requested is a letter of necessity for "brand name", she does not have to have brand name reclast as long as a generic form is available for her. It is not asking for medical necessity because of condition/medication. We can contact them and report generic is acceptable.

## 2016-11-03 NOTE — Telephone Encounter (Signed)
It should be a letter of necessity for Reclast. Request placed on Dr Raoul Pitch desk will call patient when letter available.

## 2016-11-03 NOTE — Telephone Encounter (Signed)
Pt states that she needs a letter on letter head stating why it is necessarily for her to have restasis . Pt sates that she will pick up letter when its ready and if there is any question to please call her.

## 2016-11-05 ENCOUNTER — Other Ambulatory Visit (HOSPITAL_COMMUNITY): Payer: Self-pay | Admitting: Urology

## 2016-11-05 ENCOUNTER — Ambulatory Visit (HOSPITAL_COMMUNITY)
Admission: RE | Admit: 2016-11-05 | Discharge: 2016-11-05 | Disposition: A | Discharge status: Home / Self Care | Payer: Medicare HMO | Source: Ambulatory Visit | Attending: Urology | Admitting: Urology

## 2016-11-05 DIAGNOSIS — Z981 Arthrodesis status: Secondary | ICD-10-CM | POA: Insufficient documentation

## 2016-11-05 DIAGNOSIS — R1012 Left upper quadrant pain: Secondary | ICD-10-CM

## 2016-11-05 DIAGNOSIS — J9811 Atelectasis: Secondary | ICD-10-CM | POA: Insufficient documentation

## 2016-11-05 DIAGNOSIS — I700 Atherosclerosis of aorta: Secondary | ICD-10-CM | POA: Diagnosis not present

## 2016-11-05 DIAGNOSIS — Z9889 Other specified postprocedural states: Secondary | ICD-10-CM | POA: Insufficient documentation

## 2016-11-05 DIAGNOSIS — R109 Unspecified abdominal pain: Secondary | ICD-10-CM | POA: Diagnosis not present

## 2016-11-05 DIAGNOSIS — R921 Mammographic calcification found on diagnostic imaging of breast: Secondary | ICD-10-CM | POA: Diagnosis not present

## 2016-11-05 DIAGNOSIS — I7411 Embolism and thrombosis of thoracic aorta: Secondary | ICD-10-CM | POA: Insufficient documentation

## 2016-11-05 DIAGNOSIS — R1010 Upper abdominal pain, unspecified: Secondary | ICD-10-CM | POA: Diagnosis not present

## 2016-11-05 MED ORDER — IOPAMIDOL (ISOVUE-370) INJECTION 76%
100.0000 mL | Freq: Once | INTRAVENOUS | Status: AC | PRN
  Administered 2016-11-05: 70 mL via INTRAVENOUS

## 2016-11-05 MED ORDER — IOPAMIDOL (ISOVUE-370) INJECTION 76%
INTRAVENOUS | Status: AC
  Filled 2016-11-05: qty 100

## 2016-11-08 ENCOUNTER — Telehealth: Payer: Self-pay | Admitting: Family Medicine

## 2016-11-08 NOTE — Telephone Encounter (Signed)
Pt states she is calling regarding a CT scan that she had done on Friday and wants a call back.

## 2016-11-08 NOTE — Telephone Encounter (Signed)
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  Patient will have CT report sent to [place].

## 2016-11-09 NOTE — Telephone Encounter (Signed)
I was able to see the CT from 2/2.  - I did not get notes from the ordering physician, therefore I do not know why the image was done or if they did anything about the reading (recommendatoins/referrals etc).  - Is the pt calling to just discuss? Was she told to call her PCP? Did the ordering physician  Recommend/refer etc?

## 2016-11-11 ENCOUNTER — Telehealth: Payer: Self-pay | Admitting: Family Medicine

## 2016-11-11 NOTE — Telephone Encounter (Signed)
Patient called to let CMA know that she talked with [facility] and they are going to give CMA a call. Patient did not say CMA needs to call her back.   Thank you.

## 2016-11-11 NOTE — Telephone Encounter (Signed)
Spoke with patient she states Urologist told her to follow up with her PCP about abnormal results of her CT scan she will have them fax notes so we can review and we will call her back with our recommendations.

## 2016-11-12 NOTE — Telephone Encounter (Signed)
After trying to contact the company requesting the letter several times with no success we did some research on this company since patient had told me she was making payments to them so she could get reclast. When looking up this company we discovered it was not accredited by the BBB and had over 67 complaints stating they had being paying monthly payments and had not received the medication the company had said the would get approved. We have contacted the patient and advised her not to make anymore payments to this company. We did not recommend patient participate with outside company. Informed patient we will look into options and call her back.

## 2016-11-16 ENCOUNTER — Telehealth: Payer: Self-pay | Admitting: Family Medicine

## 2016-11-16 NOTE — Telephone Encounter (Signed)
Patient would like a call back about tests that she had done. She needs to know if [employer] has everything needed for her tests. Please call patient to advise as soon as possible.   Thank you

## 2016-11-16 NOTE — Telephone Encounter (Signed)
Spoke with patient we did receive information from her Urologist. Patient denies any symptoms of abdominal pain at this time . Advised patient to call to schedule an appt if needed. Patient verbalized understanding.

## 2016-11-25 ENCOUNTER — Telehealth: Payer: Self-pay | Admitting: Family Medicine

## 2016-11-25 NOTE — Telephone Encounter (Signed)
PA request for generic Reclast submitted to patient insurance company awaiting results.

## 2016-11-25 NOTE — Addendum Note (Signed)
Addended by: Howard Pouch A on: 11/25/2016 12:24 PM   Modules accepted: Orders

## 2016-11-25 NOTE — Addendum Note (Signed)
Addended by: Howard Pouch A on: 11/25/2016 12:23 PM   Modules accepted: Orders

## 2016-11-25 NOTE — Telephone Encounter (Signed)
[name], [name], is calling for prior authorization for Zoldronic Acid.  Reference [id number]  Thank you,  -LL

## 2016-11-25 NOTE — Telephone Encounter (Signed)
Pt referred to [facility] to consider reclast treatment for her osteoporosis.  - Prolia denies by insurance and too expensive for pt.  - she suffers from severe GERD and can not take Bis.

## 2016-11-25 NOTE — Telephone Encounter (Signed)
Facility called back, questions answered and notified that form was filled out and faxed .

## 2016-12-01 ENCOUNTER — Telehealth: Payer: Self-pay | Admitting: Family Medicine

## 2016-12-01 NOTE — Telephone Encounter (Signed)
Spoke with patient she does not have any symptoms at this time advised patient to call back if she develops fever,headache ,cough . Patient verbalized understanding.

## 2016-12-01 NOTE — Telephone Encounter (Signed)
Patient is calling because she was exposed to the flu and wants to know if there are any precautions she needs to take?  Thank you,  -LL

## 2016-12-03 ENCOUNTER — Ambulatory Visit: Payer: Commercial Managed Care - HMO

## 2016-12-10 ENCOUNTER — Ambulatory Visit (INDEPENDENT_AMBULATORY_CARE_PROVIDER_SITE_OTHER): Payer: Medicare HMO

## 2016-12-10 VITALS — BP 118/64 | HR 62 | Ht 66.0 in | Wt 193.0 lb

## 2016-12-10 DIAGNOSIS — Z0000 Encounter for general adult medical examination without abnormal findings: Secondary | ICD-10-CM | POA: Diagnosis not present

## 2016-12-10 NOTE — Patient Instructions (Addendum)
Continue doing brain stimulating activities (puzzles, reading, adult coloring books, staying active) to keep memory sharp.   Continue to eat heart healthy diet (full of fruits, vegetables, whole grains, lean protein, water--limit salt, fat, and sugar intake) and increase physical activity as tolerated.   Fall Prevention in the Home Falls can cause injuries. They can happen to people of all ages. There are many things you can do to make your home safe and to help prevent falls. What can I do on the outside of my home?  Regularly fix the edges of walkways and driveways and fix any cracks.  Remove anything that might make you trip as you walk through a door, such as a raised step or threshold.  Trim any bushes or trees on the path to your home.  Use bright outdoor lighting.  Clear any walking paths of anything that might make someone trip, such as rocks or tools.  Regularly check to see if handrails are loose or broken. Make sure that both sides of any steps have handrails.  Any raised decks and porches should have guardrails on the edges.  Have any leaves, snow, or ice cleared regularly.  Use sand or salt on walking paths during winter.  Clean up any spills in your garage right away. This includes oil or grease spills. What can I do in the bathroom?  Use night lights.  Install grab bars by the toilet and in the tub and shower. Do not use towel bars as grab bars.  Use non-skid mats or decals in the tub or shower.  If you need to sit down in the shower, use a plastic, non-slip stool.  Keep the floor dry. Clean up any water that spills on the floor as soon as it happens.  Remove soap buildup in the tub or shower regularly.  Attach bath mats securely with double-sided non-slip rug tape.  Do not have throw rugs and other things on the floor that can make you trip. What can I do in the bedroom?  Use night lights.  Make sure that you have a light by your bed that is easy to  reach.  Do not use any sheets or blankets that are too big for your bed. They should not hang down onto the floor.  Have a firm chair that has side arms. You can use this for support while you get dressed.  Do not have throw rugs and other things on the floor that can make you trip. What can I do in the kitchen?  Clean up any spills right away.  Avoid walking on wet floors.  Keep items that you use a lot in easy-to-reach places.  If you need to reach something above you, use a strong step stool that has a grab bar.  Keep electrical cords out of the way.  Do not use floor polish or wax that makes floors slippery. If you must use wax, use non-skid floor wax.  Do not have throw rugs and other things on the floor that can make you trip. What can I do with my stairs?  Do not leave any items on the stairs.  Make sure that there are handrails on both sides of the stairs and use them. Fix handrails that are broken or loose. Make sure that handrails are as long as the stairways.  Check any carpeting to make sure that it is firmly attached to the stairs. Fix any carpet that is loose or worn.  Avoid having throw rugs  at the top or bottom of the stairs. If you do have throw rugs, attach them to the floor with carpet tape.  Make sure that you have a light switch at the top of the stairs and the bottom of the stairs. If you do not have them, ask someone to add them for you. What else can I do to help prevent falls?  Wear shoes that:  Do not have high heels.  Have rubber bottoms.  Are comfortable and fit you well.  Are closed at the toe. Do not wear sandals.  If you use a stepladder:  Make sure that it is fully opened. Do not climb a closed stepladder.  Make sure that both sides of the stepladder are locked into place.  Ask someone to hold it for you, if possible.  Clearly mark and make sure that you can see:  Any grab bars or handrails.  First and last steps.  Where the  edge of each step is.  Use tools that help you move around (mobility aids) if they are needed. These include:  Canes.  Walkers.  Scooters.  Crutches.  Turn on the lights when you go into a dark area. Replace any light bulbs as soon as they burn out.  Set up your furniture so you have a clear path. Avoid moving your furniture around.  If any of your floors are uneven, fix them.  If there are any pets around you, be aware of where they are.  Review your medicines with your doctor. Some medicines can make you feel dizzy. This can increase your chance of falling. Ask your doctor what other things that you can do to help prevent falls. This information is not intended to replace advice given to you by your health care provider. Make sure you discuss any questions you have with your health care provider. Document Released: 07/17/2009 Document Revised: 02/26/2016 Document Reviewed: 10/25/2014 Elsevier Interactive Patient Education  2017 [place] Maintenance, Female Adopting a healthy lifestyle and getting preventive care can go a long way to promote health and wellness. Talk with your health care provider about what schedule of regular examinations is right for you. This is a good chance for you to check in with your provider about disease prevention and staying healthy. In between checkups, there are plenty of things you can do on your own. Experts have done a lot of research about which lifestyle changes and preventive measures are most likely to keep you healthy. Ask your health care provider for more information. Weight and diet Eat a healthy diet  Be sure to include plenty of vegetables, fruits, low-fat dairy products, and lean protein.  Do not eat a lot of foods high in solid fats, added sugars, or salt.  Get regular exercise. This is one of the most important things you can do for your health.  Most adults should exercise for at least 150 minutes each week. The  exercise should increase your heart rate and make you sweat (moderate-intensity exercise).  Most adults should also do strengthening exercises at least twice a week. This is in addition to the moderate-intensity exercise. Maintain a healthy weight  Body mass index (BMI) is a measurement that can be used to identify possible weight problems. It estimates body fat based on height and weight. Your health care provider can help determine your BMI and help you achieve or maintain a healthy weight.  For females 65 years of age and older:  A BMI below  18.5 is considered underweight.  A BMI of 18.5 to 24.9 is normal.  A BMI of 25 to 29.9 is considered overweight.  A BMI of 30 and above is considered obese. Watch levels of cholesterol and blood lipids  You should start having your blood tested for lipids and cholesterol at 70 years of age, then have this test every 5 years.  You may need to have your cholesterol levels checked more often if:  Your lipid or cholesterol levels are high.  You are older than 70 years of age.  You are at high risk for heart disease. Cancer screening Lung Cancer  Lung cancer screening is recommended for adults 67-25 years old who are at high risk for lung cancer because of a history of smoking.  A yearly low-dose CT scan of the lungs is recommended for people who:  Currently smoke.  Have quit within the past 15 years.  Have at least a 30-pack-year history of smoking. A pack year is smoking an average of one pack of cigarettes a day for 1 year.  Yearly screening should continue until it has been 15 years since you quit.  Yearly screening should stop if you develop a health problem that would prevent you from having lung cancer treatment. Breast Cancer  Practice breast self-awareness. This means understanding how your breasts normally appear and feel.  It also means doing regular breast self-exams. Let your health care provider know about any changes,  no matter how small.  If you are in your 20s or 30s, you should have a clinical breast exam (CBE) by a health care provider every 1-3 years as part of a regular health exam.  If you are 31 or older, have a CBE every year. Also consider having a breast X-ray (mammogram) every year.  If you have a family history of breast cancer, talk to your health care provider about genetic screening.  If you are at high risk for breast cancer, talk to your health care provider about having an MRI and a mammogram every year.  Breast cancer gene (BRCA) assessment is recommended for women who have family members with BRCA-related cancers. BRCA-related cancers include:  Breast.  Ovarian.  Tubal.  Peritoneal cancers.  Results of the assessment will determine the need for genetic counseling and BRCA1 and BRCA2 testing. Cervical Cancer  Your health care provider may recommend that you be screened regularly for cancer of the pelvic organs (ovaries, uterus, and vagina). This screening involves a pelvic examination, including checking for microscopic changes to the surface of your cervix (Pap test). You may be encouraged to have this screening done every 3 years, beginning at age 38.  For women ages 21-65, health care providers may recommend pelvic exams and Pap testing every 3 years, or they may recommend the Pap and pelvic exam, combined with testing for human papilloma virus (HPV), every 5 years. Some types of HPV increase your risk of cervical cancer. Testing for HPV may also be done on women of any age with unclear Pap test results.  Other health care providers may not recommend any screening for nonpregnant women who are considered low risk for pelvic cancer and who do not have symptoms. Ask your health care provider if a screening pelvic exam is right for you.  If you have had past treatment for cervical cancer or a condition that could lead to cancer, you need Pap tests and screening for cancer for at  least 20 years after your treatment. If Pap  tests have been discontinued, your risk factors (such as having a new sexual partner) need to be reassessed to determine if screening should resume. Some women have medical problems that increase the chance of getting cervical cancer. In these cases, your health care provider may recommend more frequent screening and Pap tests. Colorectal Cancer  This type of cancer can be detected and often prevented.  Routine colorectal cancer screening usually begins at 70 years of age and continues through 70 years of age.  Your health care provider may recommend screening at an earlier age if you have risk factors for colon cancer.  Your health care provider may also recommend using home test kits to check for hidden blood in the stool.  A small camera at the end of a tube can be used to examine your colon directly (sigmoidoscopy or colonoscopy). This is done to check for the earliest forms of colorectal cancer.  Routine screening usually begins at age 60.  Direct examination of the colon should be repeated every 5-10 years through 70 years of age. However, you may need to be screened more often if early forms of precancerous polyps or small growths are found. Skin Cancer  Check your skin from head to toe regularly.  Tell your health care provider about any new moles or changes in moles, especially if there is a change in a mole's shape or color.  Also tell your health care provider if you have a mole that is larger than the size of a pencil eraser.  Always use sunscreen. Apply sunscreen liberally and repeatedly throughout the day.  Protect yourself by wearing long sleeves, pants, a wide-brimmed hat, and sunglasses whenever you are outside. Heart disease, diabetes, and high blood pressure  High blood pressure causes heart disease and increases the risk of stroke. High blood pressure is more likely to develop in:  People who have blood pressure in the  high end of the normal range (130-139/85-89 mm Hg).  People who are overweight or obese.  People who are African American.  If you are 106-32 years of age, have your blood pressure checked every 3-5 years. If you are 52 years of age or older, have your blood pressure checked every year. You should have your blood pressure measured twice-once when you are at a hospital or clinic, and once when you are not at a hospital or clinic. Record the average of the two measurements. To check your blood pressure when you are not at a hospital or clinic, you can use:  An automated blood pressure machine at a pharmacy.  A home blood pressure monitor.  If you are between 31 years and 24 years old, ask your health care provider if you should take aspirin to prevent strokes.  Have regular diabetes screenings. This involves taking a blood sample to check your fasting blood sugar level.  If you are at a normal weight and have a low risk for diabetes, have this test once every three years after 70 years of age.  If you are overweight and have a high risk for diabetes, consider being tested at a younger age or more often. Preventing infection Hepatitis B  If you have a higher risk for hepatitis B, you should be screened for this virus. You are considered at high risk for hepatitis B if:  You were born in a country where hepatitis B is common. Ask your health care provider which countries are considered high risk.  Your parents were born in a  high-risk country, and you have not been immunized against hepatitis B (hepatitis B vaccine).  You have HIV or AIDS.  You use needles to inject street drugs.  You live with someone who has hepatitis B.  You have had sex with someone who has hepatitis B.  You get hemodialysis treatment.  You take certain medicines for conditions, including cancer, organ transplantation, and autoimmune conditions. Hepatitis C  Blood testing is recommended for:  Everyone born  from [birth year] through 1964.  Anyone with known risk factors for hepatitis C. Sexually transmitted infections (STIs)  You should be screened for sexually transmitted infections (STIs) including gonorrhea and chlamydia if:  You are sexually active and are younger than 70 years of age.  You are older than 70 years of age and your health care provider tells you that you are at risk for this type of infection.  Your sexual activity has changed since you were last screened and you are at an increased risk for chlamydia or gonorrhea. Ask your health care provider if you are at risk.  If you do not have HIV, but are at risk, it may be recommended that you take a prescription medicine daily to prevent HIV infection. This is called pre-exposure prophylaxis (PrEP). You are considered at risk if:  You are sexually active and do not regularly use condoms or know the HIV status of your partner(s).  You take drugs by injection.  You are sexually active with a partner who has HIV. Talk with your health care provider about whether you are at high risk of being infected with HIV. If you choose to begin PrEP, you should first be tested for HIV. You should then be tested every 3 months for as long as you are taking PrEP. Pregnancy  If you are premenopausal and you may become pregnant, ask your health care provider about preconception counseling.  If you may become pregnant, take 400 to 800 micrograms (mcg) of folic acid every day.  If you want to prevent pregnancy, talk to your health care provider about birth control (contraception). Osteoporosis and menopause  Osteoporosis is a disease in which the bones lose minerals and strength with aging. This can result in serious bone fractures. Your risk for osteoporosis can be identified using a bone density scan.  If you are 92 years of age or older, or if you are at risk for osteoporosis and fractures, ask your health care provider if you should be  screened.  Ask your health care provider whether you should take a calcium or vitamin D supplement to lower your risk for osteoporosis.  Menopause may have certain physical symptoms and risks.  Hormone replacement therapy may reduce some of these symptoms and risks. Talk to your health care provider about whether hormone replacement therapy is right for you. Follow these instructions at home:  Schedule regular health, dental, and eye exams.  Stay current with your immunizations.  Do not use any tobacco products including cigarettes, chewing tobacco, or electronic cigarettes.  If you are pregnant, do not drink alcohol.  If you are breastfeeding, limit how much and how often you drink alcohol.  Limit alcohol intake to no more than 1 drink per day for nonpregnant women. One drink equals 12 ounces of beer, 5 ounces of wine, or 1 ounces of hard liquor.  Do not use street drugs.  Do not share needles.  Ask your health care provider for help if you need support or information about quitting drugs.  Tell  your health care provider if you often feel depressed.  Tell your health care provider if you have ever been abused or do not feel safe at home. This information is not intended to replace advice given to you by your health care provider. Make sure you discuss any questions you have with your health care provider. Document Released: 04/05/2011 Document Revised: 02/26/2016 Document Reviewed: 06/24/2015 Elsevier Interactive Patient Education  2017 [employer].

## 2016-12-10 NOTE — Progress Notes (Addendum)
Subjective:   [name] [name] is a 71 y.o. female who presents for Medicare Annual (Subsequent) preventive examination.  The Patient was informed that the wellness visit is to identify future health risk and educate and initiate measures that can reduce risk for increased disease through the lifespan.   Describes health as fair, good or great? 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  Review of Systems:  No ROS.  Medicare Wellness Visit.  Cardiac Risk Factors include: family history of premature cardiovascular disease;advanced age (>48men, >49 women);dyslipidemia;hypertension;obesity (BMI >30kg/m2)   Sleep patterns: Sleeps about 7-8 hours, up to void x 1 occasionally.  Home Safety/Smoke Alarms:  Smoke detectors and security in place.  Living environment; residence and Firearm Safety: Lives alone in single story home. Firearm safety discussed.  Seat Belt Safety/Bike Helmet: Wears seat belt.   Counseling:   Eye Exam-Last exam 01/2016, yearly by [facility] exam 11/18/2016, every 6 months by [facility]   Female:   Pap-N/A       Mammo-07/26/2016, negative.      Dexa scan-07/26/2016, Osteoporosis. Takes OsCal and Vitamin D.        CCS-colonoscopy 03/17/2016, normal. No recall per report.       Objective:     Vitals: BP 118/64 (BP Location: Left Arm, Patient Position: Sitting, Cuff Size: Normal)   Pulse 62   Ht 5\' 6"  (1.676 m)   Wt 193 lb (87.5 kg)   SpO2 97%   BMI 31.15 kg/m   Body mass index is 31.15 kg/m.   Tobacco History  Smoking Status  . Former Smoker  . Packs/day: 1.00  . Years: 15.00  . Types: Cigarettes  . Start date: 12/06/1993  . Quit date: 08/09/2009  Smokeless Tobacco  . Never Used    Comment: quit smoking 5 years ago     Counseling given: No   Past Medical History:  Diagnosis Date  . Anxiety   . Arthritis    oa  . Chronic cystitis   . Depression   . Fibromyalgia   . GERD (gastroesophageal reflux disease)   .  Headache    sinus  . Heart murmur   . History of duodenal ulcer    2007  . History of gastritis    2007  . Hyperlipidemia   . Internal and external hemorrhoids without complication   . Intrinsic (urethral) sphincter deficiency (ISD)   . Irritable bowel syndrome   . Osteopenia   . Pernicious anemia   . PONV (postoperative nausea and vomiting)   . Unspecified essential hypertension   . Unspecified venous (peripheral) insufficiency    wears compression hose  . Unspecified vitamin D deficiency   . Urine incontinence    Past Surgical History:  Procedure Laterality Date  . ANTERIOR AND POSTERIOR VAGINAL REPAIR  04-29-2004   AND TRANSVAGINAL TAPE SLING  . ANTERIOR CERVICAL DECOMP/DISCECTOMY FUSION  1999  . APPENDECTOMY  1984  . BILATERAL SALPINGOOPHORECTOMY  1984  . CARDIAC CATHETERIZATION  10-03-2002   DR DEGENT   NORMAL CORONARIES ARTERIES/  EF 55%  . CYSTOSCOPY N/A 08/27/2013   Procedure: CYSTOSCOPY;  Surgeon: Ailene Rud, MD;  Location: [facility];  Service: Urology;  Laterality: N/A;  . CYSTOSCOPY WITH INJECTION N/A 12/17/2013   Procedure: Ascencion Dike WITH INJECTION;  Surgeon: Ailene Rud, MD;  Location: [facility];  Service: Urology;  Laterality: N/A;  . DILATION AND CURETTAGE OF UTERUS    . EXCISION RIGHT NECK AND  LEFT BREAST SEBACEOUS CYST  05-18-2002  . HEMORRHOID BANDING  2014  . PUBOVAGINAL SLING N/A 10/25/2016   Procedure: Gaynelle Arabian;  Surgeon: Carolan Clines, MD;  Location: [facility];  Service: Urology;  Laterality: N/A;  . right litlle finger surgery  yrs ago   cyst removed x 4  . TOTAL ABDOMINAL HYSTERECTOMY  1986  . TUBAL LIGATION    . VAGINAL PROLAPSE REPAIR N/A 08/27/2013   Procedure: ANTERIOR VAGINAL VAULT SUSPENSION, KELLY PLICATION WITH SACROSPINOUS LIGAMENT FIXATION AND XENFORM BOVINE DERMIS GRAFT AUGMENTATION, URETHRAL EXPLORATION, URETHROLYSIS, EXPLANTATION OF TVT TAPE, IMPLANTATION OF FLOSEAL,  IMPLANTATION OF XENFORM BOVINE GRAFT IN PERIURETHRAL SPACE;  Surgeon: Ailene Rud, MD;  Location: [place];  Service: Urology;  Laterality: N/A  . VAGINAL PROLAPSE REPAIR N/A 10/25/2016   Procedure: VAGINAL VAULT SUSPENSION with mesh and sacrospinous repair;  Surgeon: Carolan Clines, MD;  Location: [facility];  Service: Urology;  Laterality: N/A;   Family History  Problem Relation Age of Onset  . Lung cancer Father   . Coronary artery disease Father   . Hypertension Mother   . Heart disease Mother   . Myelodysplastic syndrome Mother   . Arthritis Mother   . Dementia Maternal Aunt   . Fibromyalgia Sister   . Hypertension Brother   . Anemia Brother   . Colon polyps      aunt  . Heart disease Maternal Uncle   . Colon cancer Neg Hx    History  Sexual Activity  . Sexual activity: No    Outpatient Encounter Prescriptions as of 12/10/2016  Medication Sig  . acetaminophen (TYLENOL) 500 MG tablet Take 1,000 mg by mouth every 6 (six) hours as needed for headache.  Marland Kitchen atenolol (TENORMIN) 25 MG tablet TAKE 1 TABLET EVERY MORNING  . calcium carbonate (OS-CAL) 600 MG TABS Take 600 mg by mouth daily.  . cetirizine (ZYRTEC) 10 MG tablet Take 10 mg by mouth daily as needed for allergies.  . cholecalciferol (VITAMIN D) 1000 UNITS tablet Take 2 tablets (2,000 Units total) by mouth daily.  . Cyanocobalamin (VITAMIN B-12) 1000 MCG SUBL Place 1 tablet under the tongue daily.   . cyclobenzaprine (FLEXERIL) 5 MG tablet Take 5 mg by mouth 2 (two) times daily as needed for muscle spasms.   Marland Kitchen dicyclomine (BENTYL) 10 MG capsule Take 1 capsule (10 mg total) by mouth 4 (four) times daily -  before meals and at bedtime.  . DULoxetine (CYMBALTA) 60 MG capsule Take 1 capsule (60 mg total) by mouth 2 (two) times daily.  . folic acid (FOLVITE) 902 MCG tablet Take 800 mcg by mouth daily.   . Ginger, Zingiber officinalis, (GINGER ROOT PO) Take 1 tablet by mouth daily.   Marland Kitchen ipratropium  (ATROVENT) 0.06 % nasal spray Place 2 sprays into both nostrils 4 (four) times daily. (Patient taking differently: Place 2 sprays into both nostrils daily as needed for rhinitis. )  . lisinopril-hydrochlorothiazide (PRINZIDE,ZESTORETIC) 10-12.5 MG tablet TAKE 1 TABLET EVERY DAY  . LORazepam (ATIVAN) 0.5 MG tablet Take 1-2 tablets (0.5-1 mg total) by mouth once as needed for anxiety (for panic attack). (Patient taking differently: Take 0.5 mg by mouth daily as needed for anxiety (for panic attack). )  . Multiple Vitamins-Minerals (WOMENS MULTIVITAMIN PLUS PO) Take 1 tablet by mouth daily.  . Omega-3 Fatty Acids (FISH OIL PO) Take 1 capsule by mouth daily.   Marland Kitchen omeprazole (PRILOSEC) 20 MG capsule TAKE 1 CAPSULE EVERY MORNING  30  MINUTES BEFORE  A MEAL  . pravastatin (PRAVACHOL) 40 MG tablet TAKE 1 TABLET EVERY DAY  . sertraline (ZOLOFT) 100 MG tablet Take 1 tablet (100 mg total) by mouth daily.  . Tetrahydrozoline HCl (VISINE OP) Apply 2 drops to eye 2 (two) times daily as needed (DRY EYES).  . traMADol (ULTRAM) 50 MG tablet Take 1 tablet (50 mg total) by mouth 2 (two) times daily as needed. (Patient taking differently: Take 50 mg by mouth 2 (two) times daily as needed for moderate pain. TAKES 1 AT BEDTIME AND 1 DURING THE DAY ONLY IF NEEDED)  . traZODone (DESYREL) 100 MG tablet Take 1 tablet (100 mg total) by mouth at bedtime.  . Turmeric Curcumin 500 MG CAPS Take 1,000 mg by mouth daily.   . diclofenac sodium (VOLTAREN) 1 % GEL Apply dime sized amount to finger 2-3 times daily. (Patient not taking: Reported on 12/10/2016)  . oxyCODONE-acetaminophen (ROXICET) 5-325 MG tablet Take 1-2 tablets by mouth every 4 (four) hours as needed. (Patient not taking: Reported on 12/10/2016)  . [DISCONTINUED] lisinopril-hydrochlorothiazide (PRINZIDE,ZESTORETIC) 10-12.5 MG tablet TAKE 1 TABLET EVERY DAY   No facility-administered encounter medications on file as of 12/10/2016.     Activities of Daily Living In your  present state of health, do you have any difficulty performing the following activities: 12/10/2016 10/25/2016  Hearing? N N  Vision? N N  Difficulty concentrating or making decisions? N N  Walking or climbing stairs? N N  Dressing or bathing? N N  Doing errands, shopping? N N  Preparing Food and eating ? N -  Using the Toilet? N -  In the past six months, have you accidently leaked urine? N -  Do you have problems with loss of bowel control? N -  Managing your Medications? N -  Managing your Finances? N -  Housekeeping or managing your Housekeeping? N -  Some recent data might be hidden    Patient Care Team: Ma Hillock, DO as PCP - General (Family Medicine) Gatha Mayer, MD as Consulting Physician (Gastroenterology) Carolan Clines, MD as Consulting Physician (Urology) Calvert Cantor, MD as Consulting Physician (Ophthalmology) [facility] (Orthopedic Surgery)    Assessment:    Physical assessment deferred to PCP.  Exercise Activities and Dietary recommendations Current Exercise Habits: The patient does not participate in regular exercise at present (maintaining household and family members home), Exercise limited by: orthopedic condition(s)   Diet (meal preparation, eat out, water intake, caffeinated beverages, dairy products, fruits and vegetables): Drinks one glass of water, Pepsi (caffeine free)  Breakfast: Cereal, orange juice, coffee, prunes Lunch: normally skips, vegetables, sandwich, lean cuisine meals, fruit Dinner: sandwich, frozen meals.      Discussed heart healthy diet. Encouraged to increase activity and water intake.   Goals    . Increase water intake          Increase water intake. Would like to drink 32 ounces/day to start.       Fall Risk Fall Risk  12/10/2016 11/24/2015 09/11/2015 12/24/2014 07/08/2014  Falls in the past year? Yes No No No No  Number falls in past yr: 1 - - - -  Injury with Fall? No - - - -  Risk for fall  due to : - History of fall(s) - - -  Follow up Falls prevention discussed - - - -   Depression Screen PHQ 2/9 Scores 12/10/2016 11/24/2015 09/11/2015 12/24/2014  PHQ - 2 Score 0 0 0 0  Cognitive Function MMSE - Mini Mental State Exam 11/24/2015  Orientation to time 5  Orientation to Place 5  Registration 3  Attention/ Calculation 5  Recall 3  Language- name 2 objects 2  Language- repeat 1  Language- follow 3 step command 3  Language- read & follow direction 1  Write a sentence 1  Copy design 1  Total score 30       Ad8 score reviewed for issues:  Issues making decisions: no  Less interest in hobbies / activities: no  Repeats questions, stories (family complaining): no  Trouble using ordinary gadgets (microwave, computer, phone): no  Forgets the month or year: no  Mismanaging finances: no  Remembering appts:no  Daily problems with thinking and/or memory: no Ad8 score is=0     Immunization History  Administered Date(s) Administered  . Influenza Split 07/02/2011  . Influenza Whole 07/16/2008, 07/04/2009, 07/14/2010  . Influenza,inj,Quad PF,36+ Mos 06/29/2013, 07/08/2014  . Influenza-Unspecified 07/02/2015  . Pneumococcal Conjugate-13 05/15/2014  . Pneumococcal Polysaccharide-23 10/04/2006, 09/11/2015  . Td 03/19/2010  . Zoster 07/25/2015   Screening Tests Health Maintenance  Topic Date Due  . MAMMOGRAM  07/26/2018  . DEXA SCAN  07/27/2019  . TETANUS/TDAP  03/19/2020  . INFLUENZA VACCINE  Completed  . Hepatitis C Screening  Completed  . PNA vac Low Risk Adult  Completed      Plan:     Continue doing brain stimulating activities (puzzles, reading, adult coloring books, staying active) to keep memory sharp.   Continue to eat heart healthy diet (full of fruits, vegetables, whole grains, lean protein, water--limit salt, fat, and sugar intake) and increase physical activity as tolerated.  **Pt UTD on vaccines/screenings.  F/U with PCP on 04/13/2017, will  be fasting.**   During the course of the visit the patient was educated and counseled about the following appropriate screening and preventive services:   Vaccines to include Pneumoccal, Influenza, Hepatitis B, Td, Zostavax, HCV  Cardiovascular Disease  Colorectal cancer screening  Bone density screening  Diabetes screening  Glaucoma screening  Mammography/PAP  Nutrition counseling   Patient Instructions (the written plan) was given to the patient.   Gerilyn Nestle, RN  12/10/2016  Medical screening examination/treatment/procedure(s) were performed by non-physician practitioner and as supervising physician I was immediately available for consultation/collaboration.  I agree with above assessment and plan.  Electronically Signed by: Howard Pouch, DO [place] [facility]

## 2016-12-10 NOTE — Progress Notes (Signed)
Pre visit review using our clinic review tool, if applicable. No additional management support is needed unless otherwise documented below in the visit note. 

## 2016-12-23 ENCOUNTER — Telehealth: Payer: Self-pay | Admitting: Family Medicine

## 2016-12-23 DIAGNOSIS — R5383 Other fatigue: Secondary | ICD-10-CM | POA: Diagnosis not present

## 2016-12-23 DIAGNOSIS — Z6841 Body Mass Index (BMI) 40.0 and over, adult: Secondary | ICD-10-CM | POA: Diagnosis not present

## 2016-12-23 DIAGNOSIS — M150 Primary generalized (osteo)arthritis: Secondary | ICD-10-CM | POA: Diagnosis not present

## 2016-12-23 DIAGNOSIS — M797 Fibromyalgia: Secondary | ICD-10-CM | POA: Diagnosis not present

## 2016-12-23 DIAGNOSIS — M810 Age-related osteoporosis without current pathological fracture: Secondary | ICD-10-CM | POA: Diagnosis not present

## 2016-12-23 NOTE — Telephone Encounter (Signed)
fax # [phone] Attn Marcia Brash is calling to get the most recent dexa faxed to them. Fax # and phone # attached.

## 2016-12-27 NOTE — Telephone Encounter (Signed)
Faxed

## 2016-12-29 DIAGNOSIS — N819 Female genital prolapse, unspecified: Secondary | ICD-10-CM | POA: Diagnosis not present

## 2017-01-03 DIAGNOSIS — M810 Age-related osteoporosis without current pathological fracture: Secondary | ICD-10-CM | POA: Diagnosis not present

## 2017-01-12 DIAGNOSIS — H52213 Irregular astigmatism, bilateral: Secondary | ICD-10-CM | POA: Diagnosis not present

## 2017-01-12 DIAGNOSIS — H524 Presbyopia: Secondary | ICD-10-CM | POA: Diagnosis not present

## 2017-01-12 DIAGNOSIS — H04123 Dry eye syndrome of bilateral lacrimal glands: Secondary | ICD-10-CM | POA: Diagnosis not present

## 2017-01-12 DIAGNOSIS — H17823 Peripheral opacity of cornea, bilateral: Secondary | ICD-10-CM | POA: Diagnosis not present

## 2017-01-12 DIAGNOSIS — H26491 Other secondary cataract, right eye: Secondary | ICD-10-CM | POA: Diagnosis not present

## 2017-01-12 DIAGNOSIS — Z0100 Encounter for examination of eyes and vision without abnormal findings: Secondary | ICD-10-CM | POA: Diagnosis not present

## 2017-01-12 DIAGNOSIS — H43813 Vitreous degeneration, bilateral: Secondary | ICD-10-CM | POA: Diagnosis not present

## 2017-02-07 ENCOUNTER — Other Ambulatory Visit: Payer: Self-pay | Admitting: Family Medicine

## 2017-02-14 ENCOUNTER — Other Ambulatory Visit: Payer: Self-pay

## 2017-02-14 DIAGNOSIS — M19079 Primary osteoarthritis, unspecified ankle and foot: Secondary | ICD-10-CM

## 2017-02-14 DIAGNOSIS — M19041 Primary osteoarthritis, right hand: Secondary | ICD-10-CM

## 2017-02-14 DIAGNOSIS — M1712 Unilateral primary osteoarthritis, left knee: Secondary | ICD-10-CM

## 2017-02-14 DIAGNOSIS — M19042 Primary osteoarthritis, left hand: Secondary | ICD-10-CM

## 2017-02-14 MED ORDER — TRAMADOL HCL 50 MG PO TABS: 50.0000 mg | ORAL_TABLET | Freq: Two times a day (BID) | ORAL | 2 refills | Status: DC | PRN

## 2017-02-14 NOTE — Telephone Encounter (Signed)
Pharmacy faxed request for refill on Tramadol.

## 2017-03-14 ENCOUNTER — Other Ambulatory Visit: Payer: Self-pay

## 2017-03-14 MED ORDER — ATENOLOL 25 MG PO TABS: 25.0000 mg | ORAL_TABLET | Freq: Every morning | ORAL | 0 refills | Status: DC

## 2017-03-30 DIAGNOSIS — M1711 Unilateral primary osteoarthritis, right knee: Secondary | ICD-10-CM | POA: Diagnosis not present

## 2017-03-30 DIAGNOSIS — M25561 Pain in right knee: Secondary | ICD-10-CM | POA: Diagnosis not present

## 2017-03-30 DIAGNOSIS — M25512 Pain in left shoulder: Secondary | ICD-10-CM | POA: Diagnosis not present

## 2017-03-30 DIAGNOSIS — M19012 Primary osteoarthritis, left shoulder: Secondary | ICD-10-CM | POA: Diagnosis not present

## 2017-04-13 ENCOUNTER — Ambulatory Visit (INDEPENDENT_AMBULATORY_CARE_PROVIDER_SITE_OTHER): Payer: Medicare HMO | Admitting: Family Medicine

## 2017-04-13 ENCOUNTER — Encounter: Payer: Self-pay | Admitting: Family Medicine

## 2017-04-13 VITALS — BP 117/79 | HR 71 | Temp 98.0°F | Resp 20 | Ht 66.0 in | Wt 192.2 lb

## 2017-04-13 DIAGNOSIS — F418 Other specified anxiety disorders: Secondary | ICD-10-CM

## 2017-04-13 DIAGNOSIS — G4700 Insomnia, unspecified: Secondary | ICD-10-CM | POA: Diagnosis not present

## 2017-04-13 DIAGNOSIS — N183 Chronic kidney disease, stage 3 unspecified: Secondary | ICD-10-CM | POA: Insufficient documentation

## 2017-04-13 DIAGNOSIS — I10 Essential (primary) hypertension: Secondary | ICD-10-CM | POA: Diagnosis not present

## 2017-04-13 DIAGNOSIS — E781 Pure hyperglyceridemia: Secondary | ICD-10-CM | POA: Diagnosis not present

## 2017-04-13 DIAGNOSIS — F410 Panic disorder [episodic paroxysmal anxiety] without agoraphobia: Secondary | ICD-10-CM

## 2017-04-13 HISTORY — DX: Chronic kidney disease, stage 3 unspecified: N18.30

## 2017-04-13 MED ORDER — DULOXETINE HCL 60 MG PO CPEP: 60.0000 mg | ORAL_CAPSULE | Freq: Two times a day (BID) | ORAL | 1 refills | Status: DC

## 2017-04-13 MED ORDER — ATENOLOL 25 MG PO TABS: 25.0000 mg | ORAL_TABLET | Freq: Every morning | ORAL | 0 refills | Status: DC

## 2017-04-13 MED ORDER — TRAZODONE HCL 100 MG PO TABS: 100.0000 mg | ORAL_TABLET | Freq: Every day | ORAL | 1 refills | Status: DC

## 2017-04-13 MED ORDER — PRAVASTATIN SODIUM 40 MG PO TABS: 40.0000 mg | ORAL_TABLET | Freq: Every day | ORAL | 1 refills | Status: DC

## 2017-04-13 MED ORDER — LORAZEPAM 0.5 MG PO TABS: 0.5000 mg | ORAL_TABLET | Freq: Two times a day (BID) | ORAL | 5 refills | Status: DC | PRN

## 2017-04-13 MED ORDER — LISINOPRIL-HYDROCHLOROTHIAZIDE 10-12.5 MG PO TABS: 1.0000 | ORAL_TABLET | Freq: Every day | ORAL | 1 refills | Status: DC

## 2017-04-13 MED ORDER — SERTRALINE HCL 100 MG PO TABS: 150.0000 mg | ORAL_TABLET | Freq: Every day | ORAL | 1 refills | Status: DC

## 2017-04-13 NOTE — Patient Instructions (Addendum)
It was a pleasure seeing you today.  I will refer you to Dr. Mamie Levers.  Increase your zoloft to 1.5 tabs a day.  Follow up in 6 months on chronic medical issues.      Please help [place] help you:  We are honored you have chosen [facility] for your Primary Care home. Below you will find basic instructions that you may need to access in the future. Please help [place] help you by reading the instructions, which cover many of the frequent questions we experience.   Prescription refills and request:  -In order to allow more efficient response time, please call your pharmacy for all refills. They will forward the request electronically to [place]. This allows for the quickest possible response. Request left on a nurse line can take longer to refill, since these are checked as time allows between office patients and other phone calls.  - refill request can take up to 3-5 working days to complete.  - If request is sent electronically and request is appropiate, it is usually completed in 1-2 business days.  - all patients will need to be seen routinely for all chronic medical conditions requiring prescription medications (see follow-up below). If you are overdue for follow up on your condition, you will be asked to make an appointment and we will call in enough medication to cover you until your appointment (up to 30 days).  - all controlled substances will require a face to face visit to request/refill.  - if you desire your prescriptions to go through a new pharmacy, and have an active script at original pharmacy, you will need to call your pharmacy and have scripts transferred to new pharmacy. This is completed between the pharmacy locations and not by your provider.    Results: If any images or labs were ordered, it can take up to 1 week to get results depending on the test ordered and the lab/facility running and resulting the test. - Normal or stable results, which do not need further discussion, may  be released to your mychart immediately with attached note to you. A call may not be generated for normal results. Please make certain to sign up for mychart. If you have questions on how to activate your mychart you can call the front office.  - If your results need further discussion, our office will attempt to contact you via phone, and if unable to reach you after 2 attempts, we will release your abnormal result to your mychart with instructions.  - All results will be automatically released in mychart after 1 week.  - Your provider will provide you with explanation and instruction on all relevant material in your results. Please keep in mind, results and labs may appear confusing or abnormal to the untrained eye, but it does not mean they are actually abnormal for you personally. If you have any questions about your results that are not covered, or you desire more detailed explanation than what was provided, you should make an appointment with your provider to do so.   Our office handles many outgoing and incoming calls daily. If we have not contacted you within 1 week about your results, please check your mychart to see if there is a message first and if not, then contact our office.  In helping with this matter, you help decrease call volume, and therefore allow [place] to be able to respond to patients needs more efficiently.   Acute office visits (sick visit):  An acute  visit is intended for a new problem and are scheduled in shorter time slots to allow schedule openings for patients with new problems. This is the appropriate visit to discuss a new problem. In order to provide you with excellent quality medical care with proper time for you to explain your problem, have an exam and receive treatment with instructions, these appointments should be limited to one new problem per visit. If you experience a new problem, in which you desire to be addressed, please make an acute office visit, we save openings  on the schedule to accommodate you. Please do not save your new problem for any other type of visit, let us take care of it properly and quickly for you.   Follow up visits:  Depending on your condition(s) your provider will need to see you routinely in order to provide you with quality care and prescribe medication(s). Most chronic conditions (Example: hypertension, Diabetes, depression/anxiety... etc), require visits a couple times a year. Your provider will instruct you on proper follow up for your personal medical conditions and history. Please make certain to make follow up appointments for your condition as instructed. Failing to do so could result in lapse in your medication treatment/refills. If you request a refill, and are overdue to be seen on a condition, we will always provide you with a 30 day script (once) to allow you time to schedule.    Medicare wellness (well visit): - we have a wonderful Nurse Maudie Mercury), that will meet with you and provide you will yearly medicare wellness visits. These visits should occur yearly (can not be scheduled less than 1 calendar year apart) and cover preventive health, immunizations, advance directives and screenings you are entitled to yearly through your medicare benefits. Do not miss out on your entitled benefits, this is when medicare will pay for these benefits to be ordered for you.  These are strongly encouraged by your provider and is the appropriate type of visit to make certain you are up to date with all preventive health benefits. If you have not had your medicare wellness exam in the last 12 months, please make certain to schedule one by calling the office and schedule your medicare wellness with Maudie Mercury as soon as possible.   Yearly physical (well visit):  - Adults are recommended to be seen yearly for physicals. Check with your insurance and date of your last physical, most insurances require one calendar year between physicals. Physicals include all  preventive health topics, screenings, medical exam and labs that are appropriate for gender/age and history. You may have fasting labs needed at this visit. This is a well visit (not a sick visit), new problems should not be covered during this visit (see acute visit).  - Pediatric patients are seen more frequently when they are younger. Your provider will advise you on well child visit timing that is appropriate for your their age. - This is not a medicare wellness visit. Medicare wellness exams do not have an exam portion to the visit. Some medicare companies allow for a physical, some do not allow a yearly physical. If your medicare allows a yearly physical you can schedule the medicare wellness with our nurse Maudie Mercury and have your physical with your provider after, on the same day. Please check with insurance for your full benefits.   Late Policy/No Shows:  - all new patients should arrive 15-30 minutes earlier than appointment to allow [place] time  to  obtain all personal demographics,  insurance information  and for you to complete office paperwork. - All established patients should arrive 10-15 minutes earlier than appointment time to update all information and be checked in .  - In our best efforts to run on time, if you are late for your appointment you will be asked to either reschedule or if able, we will work you back into the schedule. There will be a wait time to work you back in the schedule,  depending on availability.  - If you are unable to make it to your appointment as scheduled, please call 24 hours ahead of time to allow [place] to fill the time slot with someone else who needs to be seen. If you do not cancel your appointment ahead of time, you may be charged a no show fee.

## 2017-04-13 NOTE — Progress Notes (Signed)
[name] , [birth year], 71 y.o., female MRN: [id number] Patient Care Team    Relationship Specialty Notifications Start End  Ma Hillock, DO PCP - General Family Medicine  06/02/15   Gatha Mayer, MD Consulting Physician Gastroenterology  11/24/15   Carolan Clines, MD Consulting Physician Urology  11/24/15   Calvert Cantor, MD Consulting Physician Ophthalmology  11/24/15   [facility]ists, [place]  Orthopedic Surgery  04/12/16     Chief Complaint  Patient presents with  . Insomnia  . Anxiety    Subjective:   Hypertension/hyperlipidemia/CK 83:  Pt reports compliance with Pravastatin 40 mg, lisinopril/HCTZ 10-12 0.5 mg and atenolol 25 mg. Blood pressures ranges at home within normal limits. Patient denies chest pain, shortness of breath or lower extremity edema. Pt does not take daily baby ASA. Pt is  prescribed statin. BMP: 10/26/2016, GFR 54, creatinine 1.03 CBC: 10/25/2016, hemoglobin 10.9, hematocrit 34.1  (chronic) Diet: Low-sodium Exercise: Attempts RF: Hypertension, hyperlipidemia. Family history of heart disease, former smoker, obesity  Anxiety/panic attack: Patient reports compliance with Zoloft 100 mg daily, Cymbalta twice a day. She feels the arthritic pain is doing well with this regimen. She does feel more anxious. 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 She is also having difficulties in her personal relationships. She has been with a man for 5 years, but she does not feel he makes her priority. She has decreased interest in activities including going to church. She has seen a therapist in the past and is open to CBT. She does use the Ativan at least once a day sometimes twice.  Insomnia: Patient reports compliance 100 Mg of Trazodone Daily at Bedtime. She Sleeps Well on This Medication, She Has No Side Effects. She Would like Refills.     Depression screen [facility] 2/9 04/13/2017 12/10/2016 11/24/2015  Decreased Interest 2 0 0  Down,  Depressed, Hopeless 1 0 0  PHQ - 2 Score 3 0 0  Altered sleeping 3 - -  Tired, decreased energy 2 - -  Change in appetite 0 - -  Feeling bad or failure about yourself  0 - -  Trouble concentrating 1 - -  Moving slowly or fidgety/restless 0 - -  Suicidal thoughts 0 - -  PHQ-9 Score 9 - -   GAD 7 : Generalized Anxiety Score 04/13/2017  Nervous, Anxious, on Edge 1  Control/stop worrying 1  Worry too much - different things 0  Trouble relaxing 2  Restless 0  Easily annoyed or irritable 0  Afraid - awful might happen 0  Total GAD 7 Score 4    Allergies  Allergen Reactions  . Morphine Nausea And Vomiting    SEVERE  . Sulfa Antibiotics Swelling   Social History  Substance Use Topics  . Smoking status: Former Smoker    Packs/day: 1.00    Years: 15.00    Types: Cigarettes    Start date: 12/06/1993    Quit date: 08/09/2009  . Smokeless tobacco: Never Used     Comment: quit smoking 5 years ago  . Alcohol use Yes     Comment: ocassional   Past Medical History:  Diagnosis Date  . Anxiety   . Arthritis    oa  . Chronic cystitis   . Depression   . Fibromyalgia   . GERD (gastroesophageal reflux disease)   . Headache    sinus  . Heart murmur   . History of duodenal ulcer  2007  . History of gastritis    2007  . Hyperlipidemia   . Internal and external hemorrhoids without complication   . Intrinsic (urethral) sphincter deficiency (ISD)   . Irritable bowel syndrome   . Osteopenia   . Pernicious anemia   . PONV (postoperative nausea and vomiting)   . Unspecified essential hypertension   . Unspecified venous (peripheral) insufficiency    wears compression hose  . Unspecified vitamin D deficiency   . Urine incontinence    Past Surgical History:  Procedure Laterality Date  . ANTERIOR AND POSTERIOR VAGINAL REPAIR  04-29-2004   AND TRANSVAGINAL TAPE SLING  . ANTERIOR CERVICAL DECOMP/DISCECTOMY FUSION  1999  . APPENDECTOMY  1984  . BILATERAL SALPINGOOPHORECTOMY  1984    . CARDIAC CATHETERIZATION  10-03-2002   DR DEGENT   NORMAL CORONARIES ARTERIES/  EF 55%  . CYSTOSCOPY N/A 08/27/2013   Procedure: CYSTOSCOPY;  Surgeon: Ailene Rud, MD;  Location: [facility];  Service: Urology;  Laterality: N/A;  . CYSTOSCOPY WITH INJECTION N/A 12/17/2013   Procedure: Ascencion Dike WITH INJECTION;  Surgeon: Ailene Rud, MD;  Location: [facility];  Service: Urology;  Laterality: N/A;  . DILATION AND CURETTAGE OF UTERUS    . EXCISION RIGHT NECK AND LEFT BREAST SEBACEOUS CYST  05-18-2002  . HEMORRHOID BANDING  2014  . PUBOVAGINAL SLING N/A 10/25/2016   Procedure: Gaynelle Arabian;  Surgeon: Carolan Clines, MD;  Location: [facility];  Service: Urology;  Laterality: N/A;  . right litlle finger surgery  yrs ago   cyst removed x 4  . TOTAL ABDOMINAL HYSTERECTOMY  1986  . TUBAL LIGATION    . VAGINAL PROLAPSE REPAIR N/A 08/27/2013   Procedure: ANTERIOR VAGINAL VAULT SUSPENSION, KELLY PLICATION WITH SACROSPINOUS LIGAMENT FIXATION AND XENFORM BOVINE DERMIS GRAFT AUGMENTATION, URETHRAL EXPLORATION, URETHROLYSIS, EXPLANTATION OF TVT TAPE, IMPLANTATION OF FLOSEAL, IMPLANTATION OF XENFORM BOVINE GRAFT IN PERIURETHRAL SPACE;  Surgeon: Ailene Rud, MD;  Location: [place];  Service: Urology;  Laterality: N/A  . VAGINAL PROLAPSE REPAIR N/A 10/25/2016   Procedure: VAGINAL VAULT SUSPENSION with mesh and sacrospinous repair;  Surgeon: Carolan Clines, MD;  Location: [facility];  Service: Urology;  Laterality: N/A;   Family History  Problem Relation Age of Onset  . Lung cancer Father   . Coronary artery disease Father   . Hypertension Mother   . Heart disease Mother   . Myelodysplastic syndrome Mother   . Arthritis Mother   . Dementia Maternal Aunt   . Fibromyalgia Sister   . Hypertension Brother   . Anemia Brother   . Colon polyps Unknown        aunt  . Heart disease Maternal Uncle   . Colon cancer Neg Hx     Allergies as of 04/13/2017      Reactions   Morphine Nausea And Vomiting   SEVERE   Sulfa Antibiotics Swelling      Medication List       Accurate as of 04/13/17  8:20 AM. Always use your most recent med list.          acetaminophen 500 MG tablet Commonly known as:  TYLENOL Take 1,000 mg by mouth every 6 (six) hours as needed for headache.   atenolol 25 MG tablet Commonly known as:  TENORMIN Take 1 tablet (25 mg total) by mouth every morning.   calcium carbonate 600 MG Tabs tablet Commonly known as:  OS-CAL Take 600 mg by mouth daily.  cetirizine 10 MG tablet Commonly known as:  ZYRTEC Take 10 mg by mouth daily as needed for allergies.   cholecalciferol 1000 units tablet Commonly known as:  VITAMIN D Take 2 tablets (2,000 Units total) by mouth daily.   dicyclomine 10 MG capsule Commonly known as:  BENTYL Take 1 capsule (10 mg total) by mouth 4 (four) times daily -  before meals and at bedtime.   DULoxetine 60 MG capsule Commonly known as:  CYMBALTA Take 1 capsule (60 mg total) by mouth 2 (two) times daily.   ESTRACE VAGINAL 0.1 MG/GM vaginal cream Generic drug:  estradiol Estrace 0.01% (0.1 mg/gram) vaginal cream  1 gm q hs x14 then 1/2 gm 2x/wk   FISH OIL PO Take 1 capsule by mouth daily.   folic acid 254 MCG tablet Commonly known as:  FOLVITE Take 800 mcg by mouth daily.   GINGER ROOT PO Take 1 tablet by mouth daily.   ipratropium 0.06 % nasal spray Commonly known as:  ATROVENT Place 2 sprays into both nostrils 4 (four) times daily.   lisinopril-hydrochlorothiazide 10-12.5 MG tablet Commonly known as:  PRINZIDE,ZESTORETIC TAKE 1 TABLET EVERY DAY   LORazepam 0.5 MG tablet Commonly known as:  ATIVAN Take 1-2 tablets (0.5-1 mg total) by mouth once as needed for anxiety (for panic attack).   omeprazole 20 MG capsule Commonly known as:  PRILOSEC TAKE 1 CAPSULE EVERY MORNING  30  MINUTES BEFORE A MEAL   pravastatin 40 MG tablet Commonly known  as:  PRAVACHOL TAKE 1 TABLET EVERY DAY   RECLAST IV Inject into the vein.   sertraline 100 MG tablet Commonly known as:  ZOLOFT Take 1 tablet (100 mg total) by mouth daily.   traMADol 50 MG tablet Commonly known as:  ULTRAM Take 1 tablet (50 mg total) by mouth 2 (two) times daily as needed for moderate pain. TAKES 1 AT BEDTIME AND 1 DURING THE DAY ONLY IF NEEDED   traZODone 100 MG tablet Commonly known as:  DESYREL Take 1 tablet (100 mg total) by mouth at bedtime.   Turmeric Curcumin 500 MG Caps Take 1,000 mg by mouth daily.   Vitamin B-12 1000 MCG Subl Place 1 tablet under the tongue daily.   WOMENS MULTIVITAMIN PLUS PO Take 1 tablet by mouth daily.       No results found for this or any previous visit (from the past 24 hour(s)). No results found.   ROS: Negative, with the exception of above mentioned in HPI   Objective:  BP 117/79 (BP Location: Right Arm, Patient Position: Sitting, Cuff Size: Large)   Pulse 71   Temp 98 F (36.7 C)   Resp 20   Ht 5\' 6"  (1.676 m)   Wt 192 lb 4 oz (87.2 kg)   SpO2 96%   BMI 31.03 kg/m  Body mass index is 31.03 kg/m. Gen: Afebrile. No acute distress.  HENT: AT. [place].  MMM.  Conjunctiva without redness, discharge or icterus. CV: RRR, No edema, +2/4 P posterior tibialis pulses Chest: CTAB, no wheeze or crackles Neuro: Normal gait. PERLA. EOMi. Alert. Oriented.x3 Psych: Normal affect, dress and demeanor. Normal speech. Normal thought content and judgment.  Assessment/Plan: MOHOGANY TOPPINS is a 71 y.o. female present for OV for  RAQUELLE PIETRO is a 71 y.o. female present for follow up  Depression with anxiety/insomnia - Discuss increasing depression, depression to use today shows increase in depression. Discussed this with patient, and she would like to try to increase  Zoloft to 150 mg daily, continue Cymbalta 60 mg twice a day. Continue trazodone.  - Refill on Ativan provided for 6 months. [place] reviewed and  appropriate. - Controlled substance contract signed. - Referral to [facility] psych for CBT - F/U 6 months as long as doing well, sooner if needed.   Hypertension/BMI 31/hyperlipidemia/CKD3: - Stable. - BMP monitored yearly, up-to-date. - Continue low-sodium diet, exercise greater than 150 minutes a week. - Refills on lisinopril-HCTZ, pravastatin, atenolol. - Follow-up 6 months.  F/U 6 months  electronically signed by:  Howard Pouch, DO  [place]

## 2017-05-12 ENCOUNTER — Other Ambulatory Visit: Payer: Self-pay | Admitting: *Deleted

## 2017-05-12 DIAGNOSIS — I10 Essential (primary) hypertension: Secondary | ICD-10-CM

## 2017-05-12 MED ORDER — LISINOPRIL-HYDROCHLOROTHIAZIDE 10-12.5 MG PO TABS: 1.0000 | ORAL_TABLET | Freq: Every day | ORAL | 1 refills | Status: DC

## 2017-05-25 ENCOUNTER — Telehealth: Payer: Self-pay | Admitting: Family Medicine

## 2017-05-25 ENCOUNTER — Encounter: Payer: Self-pay | Admitting: Family Medicine

## 2017-05-25 ENCOUNTER — Ambulatory Visit (INDEPENDENT_AMBULATORY_CARE_PROVIDER_SITE_OTHER): Payer: Medicare HMO | Admitting: Family Medicine

## 2017-05-25 VITALS — BP 127/83 | HR 62 | Temp 98.2°F | Resp 20 | Ht 66.0 in | Wt 195.5 lb

## 2017-05-25 DIAGNOSIS — M810 Age-related osteoporosis without current pathological fracture: Secondary | ICD-10-CM

## 2017-05-25 DIAGNOSIS — Z6831 Body mass index (BMI) 31.0-31.9, adult: Secondary | ICD-10-CM | POA: Diagnosis not present

## 2017-05-25 DIAGNOSIS — I10 Essential (primary) hypertension: Secondary | ICD-10-CM

## 2017-05-25 DIAGNOSIS — E781 Pure hyperglyceridemia: Secondary | ICD-10-CM

## 2017-05-25 DIAGNOSIS — N183 Chronic kidney disease, stage 3 unspecified: Secondary | ICD-10-CM

## 2017-05-25 DIAGNOSIS — Z0000 Encounter for general adult medical examination without abnormal findings: Secondary | ICD-10-CM | POA: Diagnosis not present

## 2017-05-25 LAB — COMPREHENSIVE METABOLIC PANEL
ALBUMIN: 4.2 g/dL (ref 3.5–5.2)
ALK PHOS: 51 U/L (ref 39–117)
ALT: 12 U/L (ref 0–35)
AST: 12 U/L (ref 0–37)
BILIRUBIN TOTAL: 0.4 mg/dL (ref 0.2–1.2)
BUN: 24 mg/dL — ABNORMAL HIGH (ref 6–23)
CALCIUM: 9.8 mg/dL (ref 8.4–10.5)
CO2: 33 mEq/L — ABNORMAL HIGH (ref 19–32)
Chloride: 103 mEq/L (ref 96–112)
Creatinine, Ser: 1.1 mg/dL (ref 0.40–1.20)
GFR: 52.16 mL/min — AB (ref >60.00)
Glucose, Bld: 103 mg/dL — ABNORMAL HIGH (ref 70–99)
POTASSIUM: 3.7 meq/L (ref 3.5–5.1)
Sodium: 142 mEq/L (ref 135–145)
TOTAL PROTEIN: 6.8 g/dL (ref 6.0–8.3)

## 2017-05-25 LAB — VITAMIN D 25 HYDROXY (VIT D DEFICIENCY, FRACTURES): VITD: 34.14 ng/mL (ref 30.00–100.00)

## 2017-05-25 LAB — CBC WITH DIFFERENTIAL/PLATELET
BASOS ABS: 0.1 10*3/uL (ref 0.0–0.1)
Basophils Relative: 1.5 % (ref 0.0–3.0)
EOS PCT: 3.7 % (ref 0.0–5.0)
Eosinophils Absolute: 0.3 10*3/uL (ref 0.0–0.7)
HEMATOCRIT: 37 % (ref 36.0–46.0)
HEMOGLOBIN: 11.8 g/dL — AB (ref 12.0–15.0)
LYMPHS PCT: 29.9 % (ref 12.0–46.0)
Lymphs Abs: 2.1 10*3/uL (ref 0.7–4.0)
MCHC: 32 g/dL (ref 30.0–36.0)
MCV: 89.3 fl (ref 78.0–100.0)
MONOS PCT: 7.3 % (ref 3.0–12.0)
Monocytes Absolute: 0.5 10*3/uL (ref 0.1–1.0)
Neutro Abs: 4 10*3/uL (ref 1.4–7.7)
Neutrophils Relative %: 57.6 % (ref 43.0–77.0)
Platelets: 287 10*3/uL (ref 150.0–400.0)
RBC: 4.14 Mil/uL (ref 3.87–5.11)
RDW: 14.1 % (ref 11.5–15.5)
WBC: 6.9 10*3/uL (ref 4.0–10.5)

## 2017-05-25 LAB — TSH: TSH: 2.74 u[IU]/mL (ref 0.35–4.50)

## 2017-05-25 LAB — LIPID PANEL
CHOLESTEROL: 165 mg/dL (ref 0–200)
HDL: 42.6 mg/dL (ref >39.00)
LDL Cholesterol: 94 mg/dL (ref 0–99)
NonHDL: 122.48
Total CHOL/HDL Ratio: 4
Triglycerides: 143 mg/dL (ref 0.0–149.0)
VLDL: 28.6 mg/dL (ref 0.0–40.0)

## 2017-05-25 LAB — HEMOGLOBIN A1C: Hgb A1c MFr Bld: 5.4 % (ref 4.6–6.5)

## 2017-05-25 MED ORDER — ZOSTER VAC RECOMB ADJUVANTED 50 MCG/0.5ML IM SUSR: 0.5000 mL | Freq: Once | INTRAMUSCULAR | 1 refills | Status: AC

## 2017-05-25 NOTE — Telephone Encounter (Signed)
Please call patient: All of her labs are stable. Vitamin D is normal.

## 2017-05-25 NOTE — Progress Notes (Signed)
Patient ID: [name] [name], female  DOB: 01/31/1946, 71 y.o.   MRN: [id number] Patient Care Team    Relationship Specialty Notifications Start End  Ma Hillock, DO PCP - General Family Medicine  06/02/15   Gatha Mayer, MD Consulting Physician Gastroenterology  11/24/15   Carolan Clines, MD Consulting Physician Urology  11/24/15   Calvert Cantor, MD Consulting Physician Ophthalmology  11/24/15   Specialists, [facility]  Orthopedic Surgery  04/12/16     Chief Complaint  Patient presents with  . Annual Exam    Subjective:  [name] [name] is a 71 y.o.  Female  present for CPE. All past medical history, surgical history, allergies, family history, immunizations, medications and social history were updated in the electronic medical record today. All recent labs, ED visits and hospitalizations within the last year were reviewed.  Health maintenance:  Colonoscopy: pt declined.  Mammogram: completed:07/26/2016, birads 1.  Cervical cancer screening: N/A hysterectomy Immunizations: tdap UTD 2011, Influenza UTD (encouraged yearly), PNA series completed, zostavax completed, shingrix script provided today Infectious disease screening: Hep C completed DEXA: last completed 07/26/2016, result osteroporosis, follow needed 2-3 years, on reclast Assistive device: none Oxygen UUE:[employer] Patient has a Dental home. Hospitalizations/ED visits: none   Depression screen [facility] 2/9 05/25/2017 04/13/2017 12/10/2016 11/24/2015 09/11/2015  Decreased Interest 0 2 0 0 0  Down, Depressed, Hopeless 0 1 0 0 0  PHQ - 2 Score 0 3 0 0 0  Altered sleeping 1 3 - - -  Tired, decreased energy 2 2 - - -  Change in appetite 0 0 - - -  Feeling bad or failure about yourself  0 0 - - -  Trouble concentrating 0 1 - - -  Moving slowly or fidgety/restless 0 0 - - -  Suicidal thoughts 0 0 - - -  PHQ-9 Score 3 9 - - -   GAD 7 : Generalized Anxiety Score 04/13/2017  Nervous, Anxious, on Edge 1  Control/stop  worrying 1  Worry too much - different things 0  Trouble relaxing 2  Restless 0  Easily annoyed or irritable 0  Afraid - awful might happen 0  Total GAD 7 Score 4     Current Exercise Habits: The patient does not participate in regular exercise at present Exercise limited by: None identified   Immunization History  Administered Date(s) Administered  . Influenza Split 07/02/2011  . Influenza Whole 07/16/2008, 07/04/2009, 07/14/2010  . Influenza,inj,Quad PF,6+ Mos 06/29/2013, 07/08/2014  . Influenza-Unspecified 07/02/2015  . Pneumococcal Conjugate-13 05/15/2014  . Pneumococcal Polysaccharide-23 10/04/2006, 09/11/2015  . Td 03/19/2010  . Zoster 07/25/2015     Past Medical History:  Diagnosis Date  . Anxiety   . Arthritis    oa  . Chronic cystitis   . Depression   . Fibromyalgia   . GERD (gastroesophageal reflux disease)   . Headache    sinus  . Heart murmur   . History of duodenal ulcer    2007  . History of gastritis    2007  . Hyperlipidemia   . Internal and external hemorrhoids without complication   . Intrinsic (urethral) sphincter deficiency (ISD)   . Irritable bowel syndrome   . Osteopenia   . Pernicious anemia   . PONV (postoperative nausea and vomiting)   . Unspecified essential hypertension   . Unspecified venous (peripheral) insufficiency    wears compression hose  . Unspecified vitamin D deficiency   . Urine incontinence  Allergies  Allergen Reactions  . Morphine Nausea And Vomiting    SEVERE  . Sulfa Antibiotics Swelling   Past Surgical History:  Procedure Laterality Date  . ANTERIOR AND POSTERIOR VAGINAL REPAIR  04-29-2004   AND TRANSVAGINAL TAPE SLING  . ANTERIOR CERVICAL DECOMP/DISCECTOMY FUSION  1999  . APPENDECTOMY  1984  . BILATERAL SALPINGOOPHORECTOMY  1984  . CARDIAC CATHETERIZATION  10-03-2002   DR DEGENT   NORMAL CORONARIES ARTERIES/  EF 55%  . CYSTOSCOPY N/A 08/27/2013   Procedure: CYSTOSCOPY;  Surgeon: Ailene Rud, MD;  Location: [facility];  Service: Urology;  Laterality: N/A;  . CYSTOSCOPY WITH INJECTION N/A 12/17/2013   Procedure: Ascencion Dike WITH INJECTION;  Surgeon: Ailene Rud, MD;  Location: [facility];  Service: Urology;  Laterality: N/A;  . DILATION AND CURETTAGE OF UTERUS    . EXCISION RIGHT NECK AND LEFT BREAST SEBACEOUS CYST  05-18-2002  . HEMORRHOID BANDING  2014  . PUBOVAGINAL SLING N/A 10/25/2016   Procedure: Gaynelle Arabian;  Surgeon: Carolan Clines, MD;  Location: [facility];  Service: Urology;  Laterality: N/A;  . right litlle finger surgery  yrs ago   cyst removed x 4  . TOTAL ABDOMINAL HYSTERECTOMY  1986  . TUBAL LIGATION    . VAGINAL PROLAPSE REPAIR N/A 08/27/2013   Procedure: ANTERIOR VAGINAL VAULT SUSPENSION, KELLY PLICATION WITH SACROSPINOUS LIGAMENT FIXATION AND XENFORM BOVINE DERMIS GRAFT AUGMENTATION, URETHRAL EXPLORATION, URETHROLYSIS, EXPLANTATION OF TVT TAPE, IMPLANTATION OF FLOSEAL, IMPLANTATION OF XENFORM BOVINE GRAFT IN PERIURETHRAL SPACE;  Surgeon: Ailene Rud, MD;  Location: [place];  Service: Urology;  Laterality: N/A  . VAGINAL PROLAPSE REPAIR N/A 10/25/2016   Procedure: VAGINAL VAULT SUSPENSION with mesh and sacrospinous repair;  Surgeon: Carolan Clines, MD;  Location: [facility];  Service: Urology;  Laterality: N/A;   Family History  Problem Relation Age of Onset  . Lung cancer Father   . Coronary artery disease Father   . Hypertension Mother   . Heart disease Mother   . Myelodysplastic syndrome Mother   . Arthritis Mother   . Dementia Maternal Aunt   . Fibromyalgia Sister   . Hypertension Brother   . Anemia Brother   . Colon polyps Unknown        aunt  . Heart disease Maternal Uncle   . Colon cancer Neg Hx    Social History   Social History  . Marital status: Divorced    Spouse name: N/A  . Number of children: 1  . Years of education: N/A   Occupational History    . Retired    Social History Main Topics  . Smoking status: Former Smoker    Packs/day: 1.00    Years: 15.00    Types: Cigarettes    Start date: 12/06/1993    Quit date: 08/09/2009  . Smokeless tobacco: Never Used     Comment: quit smoking 5 years ago  . Alcohol use Yes     Comment: ocassional  . Drug use: No  . Sexual activity: No   Other Topics Concern  . Not on file   Social History Narrative  . No narrative on file   Allergies as of 05/25/2017      Reactions   Morphine Nausea And Vomiting   SEVERE   Sulfa Antibiotics Swelling      Medication List       Accurate as of 05/25/17  8:47 AM. Always use your most recent med list.  acetaminophen 500 MG tablet Commonly known as:  TYLENOL Take 1,000 mg by mouth every 6 (six) hours as needed for headache.   atenolol 25 MG tablet Commonly known as:  TENORMIN Take 1 tablet (25 mg total) by mouth every morning.   calcium carbonate 600 MG Tabs tablet Commonly known as:  OS-CAL Take 600 mg by mouth daily.   cetirizine 10 MG tablet Commonly known as:  ZYRTEC Take 10 mg by mouth daily as needed for allergies.   cholecalciferol 1000 units tablet Commonly known as:  VITAMIN D Take 2 tablets (2,000 Units total) by mouth daily.   dicyclomine 10 MG capsule Commonly known as:  BENTYL Take 1 capsule (10 mg total) by mouth 4 (four) times daily -  before meals and at bedtime.   DULoxetine 60 MG capsule Commonly known as:  CYMBALTA Take 1 capsule (60 mg total) by mouth 2 (two) times daily.   ESTRACE VAGINAL 0.1 MG/GM vaginal cream Generic drug:  estradiol Estrace 0.01% (0.1 mg/gram) vaginal cream  1 gm q hs x14 then 1/2 gm 2x/wk   FISH OIL PO Take 1 capsule by mouth daily.   fluticasone 50 MCG/ACT nasal spray Commonly known as:  FLONASE Place into both nostrils daily.   folic acid 277 MCG tablet Commonly known as:  FOLVITE Take 800 mcg by mouth daily.   lisinopril-hydrochlorothiazide 10-12.5 MG  tablet Commonly known as:  PRINZIDE,ZESTORETIC Take 1 tablet by mouth daily.   LORazepam 0.5 MG tablet Commonly known as:  ATIVAN Take 1 tablet (0.5 mg total) by mouth every 12 (twelve) hours as needed for anxiety (for panic attack).   omeprazole 20 MG capsule Commonly known as:  PRILOSEC TAKE 1 CAPSULE EVERY MORNING  30  MINUTES BEFORE A MEAL   pravastatin 40 MG tablet Commonly known as:  PRAVACHOL Take 1 tablet (40 mg total) by mouth daily.   RECLAST IV Inject into the vein.   sertraline 100 MG tablet Commonly known as:  ZOLOFT Take 1.5 tablets (150 mg total) by mouth daily.   traMADol 50 MG tablet Commonly known as:  ULTRAM Take 1 tablet (50 mg total) by mouth 2 (two) times daily as needed for moderate pain. TAKES 1 AT BEDTIME AND 1 DURING THE DAY ONLY IF NEEDED   traZODone 100 MG tablet Commonly known as:  DESYREL Take 1 tablet (100 mg total) by mouth at bedtime.   Turmeric Curcumin 500 MG Caps Take 1,000 mg by mouth daily.   Vitamin B-12 1000 MCG Subl Place 1 tablet under the tongue daily.   WOMENS MULTIVITAMIN PLUS PO Take 1 tablet by mouth daily.   Zoster Vac Recomb Adjuvanted injection Commonly known as:  SHINGRIX Inject 0.5 mLs into the muscle once. Repeat dose in 2-6 months            Discharge Care Instructions        Start     Ordered   05/25/17 0000  CBC w/Diff     05/25/17 0829   05/25/17 0000  Comp Met (CMET)     05/25/17 0829   05/25/17 0000  Lipid panel     05/25/17 0829   05/25/17 0000  TSH     05/25/17 0829   05/25/17 0000  HgB A1c     05/25/17 0829   05/25/17 0000  Vitamin D (25 hydroxy)     05/25/17 0829   05/25/17 0000  Zoster Vac Recomb Adjuvanted [facility]) injection   Once     05/25/17  0837      All past medical history, surgical history, allergies, family history, immunizations andmedications were updated in the EMR today and reviewed under the history and medication portions of their EMR.     No results found for this  or any previous visit (from the past 2160 hour(s)).  Ct Angio Chest Pe W Or Wo Contrast  Result Date: 11/05/2016 CLINICAL DATA:  Acute onset of upper abdominal pain. Recent surgery for bladder incontinence. Assess for pulmonary embolus. Initial encounter. EXAM: CT ANGIOGRAPHY CHEST WITH CONTRAST TECHNIQUE: Multidetector CT imaging of the chest was performed using the standard protocol during bolus administration of intravenous contrast. Multiplanar CT image reconstructions and MIPs were obtained to evaluate the vascular anatomy. CONTRAST:  70 mL of Isovue 370 IV contrast COMPARISON:  Chest radiograph performed 03/22/2012 FINDINGS: Cardiovascular:  There is no evidence of pulmonary embolus. The heart is normal in size. Mild calcification is seen along the aortic arch and descending thoracic aorta, with minimal mural thrombus along the descending thoracic aorta. The great vessels are grossly unremarkable in appearance. Mediastinum/Nodes: The mediastinum is unremarkable in appearance. No mediastinal lymphadenopathy is seen. No pericardial effusion is identified. The visualized portions of the thyroid gland are unremarkable. No axillary lymphadenopathy is seen. Mild calcification is noted about a 1.2 cm nodule at the 7 o'clock position of the right breast. Lungs/Pleura: Minimal bibasilar atelectasis is noted. The lungs are otherwise clear. No pleural effusion or pneumothorax is seen. No masses are identified. Upper Abdomen: The visualized portions of the liver and gallbladder are grossly unremarkable. Slight haziness about the fundus of the gallbladder is thought to reflect motion artifact. 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 This may correspond to the patient's symptoms. The visualized portions of the pancreas, adrenal glands and left kidney are grossly unremarkable. Musculoskeletal: No acute osseous abnormalities are identified.  Anterior cervical spinal fusion hardware is partially imaged. The visualized musculature is unremarkable in appearance. Review of the MIP images confirms the above findings. IMPRESSION: 1. No evidence of pulmonary embolus. 2. 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 This may correspond to the patient's symptoms. 3. Minimal bibasilar atelectasis noted.  Lungs otherwise clear. 4. Mild calcification about a 1.2 cm nodule at the 7 o'clock position of the right breast. Would correlate with prior mammogram, and consider further evaluation as deemed clinically appropriate. Electronically Signed   By: Garald Balding M.D.   On: 11/05/2016 17:25     ROS: 14 pt review of systems performed and negative (unless mentioned in an HPI)  Objective: BP 127/83 (BP Location: Right Arm, Patient Position: Sitting, Cuff Size: Large)   Pulse 62   Temp 98.2 F (36.8 C)   Resp 20   Ht _0  (1.676 m)   Wt 195 lb 8 oz (88.7 kg)   SpO2 98%   BMI 31.55 kg/m  Gen: Afebrile. No acute distress. Nontoxic in appearance, well-developed, well-nourished,  Very pleasant caucasian female.  HENT: AT. [place]. Bilateral TM visualized and normal in appearance, normal external auditory canal. MMM, no oral lesions, adequate dentition. Bilateral nares within normal limits. Throat without erythema, ulcerations or exudates. no Cough on exam, no hoarseness on exam. Eyes:Pupils Equal Round Reactive to light, Extraocular movements intact,  Conjunctiva without redness, discharge or icterus. Neck/lymp/endocrine: Supple,no lymphadenopathy, no thyromegaly CV: RRR no murmur, no edema, +2/4 P posterior tibialis pulses.  no carotid bruits. No JVD. Chest: CTAB, no wheeze, rhonchi or crackles. normal Respiratory effort. normal Air movement. Abd: Soft. obese. NTND. BS present. no Masses palpated. No hepatosplenomegaly. No rebound tenderness or guarding. Skin: no rashes, purpura or  petechiae. Warm and well-perfused. Skin intact. Neuro/Msk:  Normal gait. PERLA. EOMi. Alert. Oriented x3.  Cranial nerves II through XII intact. Muscle strength 5/5 upper/lower extremity. DTRs equal bilaterally. Psych: Normal affect, dress and demeanor. Normal speech. Normal thought content and judgment.   No exam data present  Assessment/plan: [name] [name] is a 71 y.o. female present for CPE. Encounter for preventive health examination Patient was encouraged to exercise greater than 150 minutes a week. Patient was encouraged to choose a diet filled with fresh fruits and vegetables, and lean meats. AVS provided to patient today for education/recommendation on gender specific health and safety maintenance. Shingrix script provided today.  Flu shot encouraged mid Septemeber Colonoscopy: pt declined.  Mammogram: completed:07/26/2016, birads 1. Rpt yearly.  Cervical cancer screening: N/A hysterectomy Immunizations: tdap UTD 2011, Influenza UTD (encouraged yearly), PNA series completed, zostavax completed, shingrix script provided today Infectious disease screening: Hep C completed DEXA: last completed 07/26/2016, result osteroporosis, follow needed 2-3 years, on reclast  Essential hypertension/HLD/BMI 31> - CBC w/Diff - Comp Met (CMET) - TSH - HgB A1c CKD (chronic kidney disease) stage 3, GFR 30-59 ml/min - Comp Met (CMET) Osteoporosis without current pathological fracture, unspecified osteoporosis type - cont reclast  - vit.d, calcium and weight bearing exercise.  - TSH - Vitamin D (25 hydroxy)  Return in about 1 year (around 05/25/2018) for CPE.  Electronically signed by: Howard Pouch, DO [place]

## 2017-05-25 NOTE — Patient Instructions (Signed)

## 2017-05-26 NOTE — Telephone Encounter (Signed)
Patient notified and verbalized understanding. 

## 2017-06-23 ENCOUNTER — Other Ambulatory Visit: Payer: Self-pay | Admitting: Family Medicine

## 2017-06-23 DIAGNOSIS — Z1231 Encounter for screening mammogram for malignant neoplasm of breast: Secondary | ICD-10-CM

## 2017-06-27 DIAGNOSIS — Z6841 Body Mass Index (BMI) 40.0 and over, adult: Secondary | ICD-10-CM | POA: Diagnosis not present

## 2017-06-27 DIAGNOSIS — M797 Fibromyalgia: Secondary | ICD-10-CM | POA: Diagnosis not present

## 2017-06-27 DIAGNOSIS — M810 Age-related osteoporosis without current pathological fracture: Secondary | ICD-10-CM | POA: Diagnosis not present

## 2017-06-27 DIAGNOSIS — M150 Primary generalized (osteo)arthritis: Secondary | ICD-10-CM | POA: Diagnosis not present

## 2017-06-27 DIAGNOSIS — M2550 Pain in unspecified joint: Secondary | ICD-10-CM | POA: Diagnosis not present

## 2017-07-21 ENCOUNTER — Telehealth: Payer: Self-pay | Admitting: *Deleted

## 2017-07-21 DIAGNOSIS — M19042 Primary osteoarthritis, left hand: Secondary | ICD-10-CM

## 2017-07-21 DIAGNOSIS — M1712 Unilateral primary osteoarthritis, left knee: Secondary | ICD-10-CM

## 2017-07-21 DIAGNOSIS — M19079 Primary osteoarthritis, unspecified ankle and foot: Secondary | ICD-10-CM

## 2017-07-21 DIAGNOSIS — M19041 Primary osteoarthritis, right hand: Secondary | ICD-10-CM

## 2017-07-21 NOTE — Telephone Encounter (Signed)
Patient called and left message requesting refill on her tramadol. She was last seen for CPE 05/25/17. She is requesting 90 day supply sent to [facility] mail order pharmacy last refill 02/14/17 for 60 tabs 2 refills.

## 2017-07-22 MED ORDER — TRAMADOL HCL 50 MG PO TABS: 50.0000 mg | ORAL_TABLET | Freq: Two times a day (BID) | ORAL | 0 refills | Status: DC | PRN

## 2017-07-22 NOTE — Telephone Encounter (Signed)
Trego reviewed. Will refill this time given she was seen but did not think she needed refills.

## 2017-07-29 ENCOUNTER — Ambulatory Visit: Payer: Medicare HMO

## 2017-08-15 ENCOUNTER — Other Ambulatory Visit: Payer: Self-pay | Admitting: *Deleted

## 2017-08-15 MED ORDER — DICYCLOMINE HCL 10 MG PO CAPS: 10.0000 mg | ORAL_CAPSULE | Freq: Three times a day (TID) | ORAL | 5 refills | Status: DC

## 2017-08-17 ENCOUNTER — Encounter: Payer: Self-pay | Admitting: Family Medicine

## 2017-08-17 ENCOUNTER — Ambulatory Visit
Admission: RE | Admit: 2017-08-17 | Discharge: 2017-08-17 | Disposition: A | Discharge status: Home / Self Care | Payer: Medicare HMO | Source: Ambulatory Visit | Attending: Family Medicine | Admitting: Family Medicine

## 2017-08-17 DIAGNOSIS — Z1231 Encounter for screening mammogram for malignant neoplasm of breast: Secondary | ICD-10-CM | POA: Diagnosis not present

## 2017-08-17 DIAGNOSIS — R928 Other abnormal and inconclusive findings on diagnostic imaging of breast: Secondary | ICD-10-CM

## 2017-08-17 HISTORY — DX: Other abnormal and inconclusive findings on diagnostic imaging of breast: R92.8

## 2017-08-18 ENCOUNTER — Other Ambulatory Visit: Payer: Self-pay | Admitting: Family Medicine

## 2017-08-18 DIAGNOSIS — R928 Other abnormal and inconclusive findings on diagnostic imaging of breast: Secondary | ICD-10-CM

## 2017-08-22 ENCOUNTER — Ambulatory Visit
Admission: RE | Admit: 2017-08-22 | Discharge: 2017-08-22 | Disposition: A | Discharge status: Home / Self Care | Payer: Medicare HMO | Source: Ambulatory Visit | Attending: Family Medicine | Admitting: Family Medicine

## 2017-08-22 DIAGNOSIS — R928 Other abnormal and inconclusive findings on diagnostic imaging of breast: Secondary | ICD-10-CM

## 2017-08-22 DIAGNOSIS — N6002 Solitary cyst of left breast: Secondary | ICD-10-CM | POA: Diagnosis not present

## 2017-09-22 ENCOUNTER — Other Ambulatory Visit: Payer: Self-pay | Admitting: *Deleted

## 2017-09-22 DIAGNOSIS — I10 Essential (primary) hypertension: Secondary | ICD-10-CM

## 2017-09-22 MED ORDER — ATENOLOL 25 MG PO TABS: 25.0000 mg | ORAL_TABLET | Freq: Every morning | ORAL | 0 refills | Status: DC

## 2017-10-12 ENCOUNTER — Ambulatory Visit: Payer: Medicare HMO | Admitting: Family Medicine

## 2017-10-17 ENCOUNTER — Ambulatory Visit: Payer: Medicare HMO | Admitting: Family Medicine

## 2017-10-19 ENCOUNTER — Encounter: Payer: Self-pay | Admitting: Family Medicine

## 2017-10-19 ENCOUNTER — Ambulatory Visit (INDEPENDENT_AMBULATORY_CARE_PROVIDER_SITE_OTHER): Payer: Medicare HMO | Admitting: Family Medicine

## 2017-10-19 VITALS — BP 101/68 | HR 73 | Temp 98.0°F | Wt 201.8 lb

## 2017-10-19 DIAGNOSIS — M1712 Unilateral primary osteoarthritis, left knee: Secondary | ICD-10-CM

## 2017-10-19 DIAGNOSIS — I10 Essential (primary) hypertension: Secondary | ICD-10-CM | POA: Diagnosis not present

## 2017-10-19 DIAGNOSIS — N183 Chronic kidney disease, stage 3 unspecified: Secondary | ICD-10-CM

## 2017-10-19 DIAGNOSIS — F410 Panic disorder [episodic paroxysmal anxiety] without agoraphobia: Secondary | ICD-10-CM | POA: Diagnosis not present

## 2017-10-19 DIAGNOSIS — M19079 Primary osteoarthritis, unspecified ankle and foot: Secondary | ICD-10-CM

## 2017-10-19 DIAGNOSIS — G4700 Insomnia, unspecified: Secondary | ICD-10-CM | POA: Diagnosis not present

## 2017-10-19 DIAGNOSIS — M19042 Primary osteoarthritis, left hand: Secondary | ICD-10-CM | POA: Diagnosis not present

## 2017-10-19 DIAGNOSIS — M19041 Primary osteoarthritis, right hand: Secondary | ICD-10-CM

## 2017-10-19 DIAGNOSIS — F418 Other specified anxiety disorders: Secondary | ICD-10-CM

## 2017-10-19 DIAGNOSIS — E781 Pure hyperglyceridemia: Secondary | ICD-10-CM

## 2017-10-19 MED ORDER — SERTRALINE HCL 100 MG PO TABS: 200.0000 mg | ORAL_TABLET | Freq: Every day | ORAL | 1 refills | Status: DC

## 2017-10-19 MED ORDER — DULOXETINE HCL 60 MG PO CPEP: 60.0000 mg | ORAL_CAPSULE | Freq: Two times a day (BID) | ORAL | 1 refills | Status: DC

## 2017-10-19 MED ORDER — OMEPRAZOLE 20 MG PO CPDR: DELAYED_RELEASE_CAPSULE | ORAL | 3 refills | Status: DC

## 2017-10-19 MED ORDER — LORAZEPAM 0.5 MG PO TABS: 0.5000 mg | ORAL_TABLET | Freq: Two times a day (BID) | ORAL | 5 refills | Status: DC | PRN

## 2017-10-19 MED ORDER — TRAZODONE HCL 100 MG PO TABS: 100.0000 mg | ORAL_TABLET | Freq: Every day | ORAL | 1 refills | Status: DC

## 2017-10-19 MED ORDER — TRAMADOL HCL 50 MG PO TABS: 50.0000 mg | ORAL_TABLET | Freq: Two times a day (BID) | ORAL | 1 refills | Status: DC | PRN

## 2017-10-19 MED ORDER — ATENOLOL 25 MG PO TABS: 25.0000 mg | ORAL_TABLET | Freq: Every morning | ORAL | 0 refills | Status: DC

## 2017-10-19 MED ORDER — LISINOPRIL-HYDROCHLOROTHIAZIDE 10-12.5 MG PO TABS: 1.0000 | ORAL_TABLET | Freq: Every day | ORAL | 1 refills | Status: DC

## 2017-10-19 NOTE — Patient Instructions (Signed)
It was nice to see you.  Increase zoloft 200 mg a day (2 pills daily).   I have refilled all your medications.  Follow up in 6 months as long as doing well, sooner if needed.     Please help [place] help you:  We are honored you have chosen [facility] for your Primary Care home. Below you will find basic instructions that you may need to access in the future. Please help [place] help you by reading the instructions, which cover many of the frequent questions we experience.   Prescription refills and request:  -In order to allow more efficient response time, please call your pharmacy for all refills. They will forward the request electronically to [place]. This allows for the quickest possible response. Request left on a nurse line can take longer to refill, since these are checked as time allows between office patients and other phone calls.  - refill request can take up to 3-5 working days to complete.  - If request is sent electronically and request is appropiate, it is usually completed in 1-2 business days.  - all patients will need to be seen routinely for all chronic medical conditions requiring prescription medications (see follow-up below). If you are overdue for follow up on your condition, you will be asked to make an appointment and we will call in enough medication to cover you until your appointment (up to 30 days).  - all controlled substances will require a face to face visit to request/refill.  - if you desire your prescriptions to go through a new pharmacy, and have an active script at original pharmacy, you will need to call your pharmacy and have scripts transferred to new pharmacy. This is completed between the pharmacy locations and not by your provider.    Results: If any images or labs were ordered, it can take up to 1 week to get results depending on the test ordered and the lab/facility running and resulting the test. - Normal or stable results, which do not need further  discussion, may be released to your mychart immediately with attached note to you. A call may not be generated for normal results. Please make certain to sign up for mychart. If you have questions on how to activate your mychart you can call the front office.  - If your results need further discussion, our office will attempt to contact you via phone, and if unable to reach you after 2 attempts, we will release your abnormal result to your mychart with instructions.  - All results will be automatically released in mychart after 1 week.  - Your provider will provide you with explanation and instruction on all relevant material in your results. Please keep in mind, results and labs may appear confusing or abnormal to the untrained eye, but it does not mean they are actually abnormal for you personally. If you have any questions about your results that are not covered, or you desire more detailed explanation than what was provided, you should make an appointment with your provider to do so.   Our office handles many outgoing and incoming calls daily. If we have not contacted you within 1 week about your results, please check your mychart to see if there is a message first and if not, then contact our office.  In helping with this matter, you help decrease call volume, and therefore allow [place] to be able to respond to patients needs more efficiently.   Acute office visits (sick visit):  An acute visit is intended for a new problem and are scheduled in shorter time slots to allow schedule openings for patients with new problems. This is the appropriate visit to discuss a new problem. In order to provide you with excellent quality medical care with proper time for you to explain your problem, have an exam and receive treatment with instructions, these appointments should be limited to one new problem per visit. If you experience a new problem, in which you desire to be addressed, please make an acute office visit,  we save openings on the schedule to accommodate you. Please do not save your new problem for any other type of visit, let us take care of it properly and quickly for you.   Follow up visits:  Depending on your condition(s) your provider will need to see you routinely in order to provide you with quality care and prescribe medication(s). Most chronic conditions (Example: hypertension, Diabetes, depression/anxiety... etc), require visits a couple times a year. Your provider will instruct you on proper follow up for your personal medical conditions and history. Please make certain to make follow up appointments for your condition as instructed. Failing to do so could result in lapse in your medication treatment/refills. If you request a refill, and are overdue to be seen on a condition, we will always provide you with a 30 day script (once) to allow you time to schedule.    Medicare wellness (well visit): - we have a wonderful Nurse Maudie Mercury), that will meet with you and provide you will yearly medicare wellness visits. These visits should occur yearly (can not be scheduled less than 1 calendar year apart) and cover preventive health, immunizations, advance directives and screenings you are entitled to yearly through your medicare benefits. Do not miss out on your entitled benefits, this is when medicare will pay for these benefits to be ordered for you.  These are strongly encouraged by your provider and is the appropriate type of visit to make certain you are up to date with all preventive health benefits. If you have not had your medicare wellness exam in the last 12 months, please make certain to schedule one by calling the office and schedule your medicare wellness with Maudie Mercury as soon as possible.   Yearly physical (well visit):  - Adults are recommended to be seen yearly for physicals. Check with your insurance and date of your last physical, most insurances require one calendar year between physicals.  Physicals include all preventive health topics, screenings, medical exam and labs that are appropriate for gender/age and history. You may have fasting labs needed at this visit. This is a well visit (not a sick visit), new problems should not be covered during this visit (see acute visit).  - Pediatric patients are seen more frequently when they are younger. Your provider will advise you on well child visit timing that is appropriate for your their age. - This is not a medicare wellness visit. Medicare wellness exams do not have an exam portion to the visit. Some medicare companies allow for a physical, some do not allow a yearly physical. If your medicare allows a yearly physical you can schedule the medicare wellness with our nurse Maudie Mercury and have your physical with your provider after, on the same day. Please check with insurance for your full benefits.   Late Policy/No Shows:  - all new patients should arrive 15-30 minutes earlier than appointment to allow [place] time  to  obtain all personal demographics,  insurance information and for you to complete office paperwork. - All established patients should arrive 10-15 minutes earlier than appointment time to update all information and be checked in .  - In our best efforts to run on time, if you are late for your appointment you will be asked to either reschedule or if able, we will work you back into the schedule. There will be a wait time to work you back in the schedule,  depending on availability.  - If you are unable to make it to your appointment as scheduled, please call 24 hours ahead of time to allow [place] to fill the time slot with someone else who needs to be seen. If you do not cancel your appointment ahead of time, you may be charged a no show fee.

## 2017-10-19 NOTE — Progress Notes (Signed)
[name] [name] , 1945/12/05, 72 y.o., female MRN: [id number] Patient Care Team    Relationship Specialty Notifications Start End  Ma Hillock, DO PCP - General Family Medicine  06/02/15   Gatha Mayer, MD Consulting Physician Gastroenterology  11/24/15   Carolan Clines, MD Consulting Physician Urology  11/24/15   Calvert Cantor, MD Consulting Physician Ophthalmology  11/24/15   [facility] [place]  Orth[facility]  04/12/16     Chief Complaint  Patient presents with  . Follow-up    pts is here for her 6 months f/u for insomnia and anxiety    Subjective:   Hypertension/hyperlipidemia/CK 83:  Pt reports compliance with Pravastatin 40 mg, lisinopril/HCTZ 10-12 0.5 mg and atenolol 25 mg. Blood pressures ranges at home within normal limits. Patient denies chest pain, shortness of breath, dizziness or lower extremity edema.   Pt does not take daily baby ASA. Pt is  prescribed statin. BMP: 05/25/2017, GFR 52, creatinine 1.1 CBC: 05/25/2017, hemoglobin 11.8, hematocrit 37(chronic) Diet: Low-sodium Exercise: Attempts RF: Hypertension, hyperlipidemia. Family history of heart disease, former smoker, obesity  Anxiety/panic attack: Patient reports compliance with Zoloft 150 mg daily, Cymbalta twice a day. She initially felt that the arthritic pain was improved on this regimen, but now feels that she is achy all over again. She does feel more anxious. 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 She is also having difficulties feeling alone. She does not feel her family stays in touch with her as much as she would like. She feels like she is the one initiates contact with her family members and this is upsetting to her. She was offered referral to psychology, and she declined last visit (once she was called to schedule) She does use the Ativan at least once a day sometimes twice.  Insomnia: Patient reports compliance 100 Mg of Trazodone Daily at Bedtime.  She Sleeps Well on This Medication, She Has No Side Effects. She Would like Refills.     Arthritis/pain: Patient uses tramadol 50 mg twice a day when necessary for arthritic pain. She is in need of refills today.  Depression screen [facility] 2/9 05/25/2017 04/13/2017 12/10/2016  Decreased Interest 0 2 0  Down, Depressed, Hopeless 0 1 0  PHQ - 2 Score 0 3 0  Altered sleeping 1 3 -  Tired, decreased energy 2 2 -  Change in appetite 0 0 -  Feeling bad or failure about yourself  0 0 -  Trouble concentrating 0 1 -  Moving slowly or fidgety/restless 0 0 -  Suicidal thoughts 0 0 -  PHQ-9 Score 3 9 -   GAD 7 : Generalized Anxiety Score 04/13/2017  Nervous, Anxious, on Edge 1  Control/stop worrying 1  Worry too much - different things 0  Trouble relaxing 2  Restless 0  Easily annoyed or irritable 0  Afraid - awful might happen 0  Total GAD 7 Score 4    Allergies  Allergen Reactions  . Morphine Nausea And Vomiting    SEVERE  . Sulfa Antibiotics Swelling   Social History   Tobacco Use  . Smoking status: Former Smoker    Packs/day: 1.00    Years: 15.00    Pack years: 15.00    Types: Cigarettes    Start date: 12/06/1993    Last attempt to quit: 08/09/2009    Years since quitting: 8.2  . Smokeless tobacco: Never Used  . Tobacco comment: quit smoking 5 years ago  Substance Use Topics  . Alcohol use: Yes    Comment: ocassional   Past Medical History:  Diagnosis Date  . Anxiety   . Arthritis    oa  . Chronic cystitis   . Depression   . Fibromyalgia   . GERD (gastroesophageal reflux disease)   . Headache    sinus  . Heart murmur   . History of duodenal ulcer    2007  . History of gastritis    2007  . Hyperlipidemia   . Internal and external hemorrhoids without complication   . Intrinsic (urethral) sphincter deficiency (ISD)   . Irritable bowel syndrome   . Osteopenia   . Pernicious anemia   . PONV (postoperative nausea and vomiting)   . Unspecified essential hypertension     . Unspecified venous (peripheral) insufficiency    wears compression hose  . Unspecified vitamin D deficiency   . Urine incontinence    Past Surgical History:  Procedure Laterality Date  . ANTERIOR AND POSTERIOR VAGINAL REPAIR  04-29-2004   AND TRANSVAGINAL TAPE SLING  . ANTERIOR CERVICAL DECOMP/DISCECTOMY FUSION  1999  . APPENDECTOMY  1984  . BILATERAL SALPINGOOPHORECTOMY  1984  . CARDIAC CATHETERIZATION  10-03-2002   DR DEGENT   NORMAL CORONARIES ARTERIES/  EF 55%  . CYSTOSCOPY N/A 08/27/2013   Procedure: CYSTOSCOPY;  Surgeon: Ailene Rud, MD;  Location: [facility];  Service: Urology;  Laterality: N/A;  . CYSTOSCOPY WITH INJECTION N/A 12/17/2013   Procedure: Ascencion Dike WITH INJECTION;  Surgeon: Ailene Rud, MD;  Location: [facility];  Service: Urology;  Laterality: N/A;  . DILATION AND CURETTAGE OF UTERUS    . EXCISION RIGHT NECK AND LEFT BREAST SEBACEOUS CYST  05-18-2002  . HEMORRHOID BANDING  2014  . PUBOVAGINAL SLING N/A 10/25/2016   Procedure: Gaynelle Arabian;  Surgeon: Carolan Clines, MD;  Location: [facility];  Service: Urology;  Laterality: N/A;  . right litlle finger surgery  yrs ago   cyst removed x 4  . TOTAL ABDOMINAL HYSTERECTOMY  1986  . TUBAL LIGATION    . VAGINAL PROLAPSE REPAIR N/A 08/27/2013   Procedure: ANTERIOR VAGINAL VAULT SUSPENSION, KELLY PLICATION WITH SACROSPINOUS LIGAMENT FIXATION AND XENFORM BOVINE DERMIS GRAFT AUGMENTATION, URETHRAL EXPLORATION, URETHROLYSIS, EXPLANTATION OF TVT TAPE, IMPLANTATION OF FLOSEAL, IMPLANTATION OF XENFORM BOVINE GRAFT IN PERIURETHRAL SPACE;  Surgeon: Ailene Rud, MD;  Location: [facility];  Service: Urology;  Laterality: N/A  . VAGINAL PROLAPSE REPAIR N/A 10/25/2016   Procedure: VAGINAL VAULT SUSPENSION with mesh and sacrospinous repair;  Surgeon: Carolan Clines, MD;  Location: [facility];  Service: Urology;  Laterality: N/A;   Family History   Problem Relation Age of Onset  . Lung cancer Father   . Coronary artery disease Father   . Hypertension Mother   . Heart disease Mother   . Myelodysplastic syndrome Mother   . Arthritis Mother   . Dementia Maternal Aunt   . Fibromyalgia Sister   . Hypertension Brother   . Anemia Brother   . Colon polyps Unknown        aunt  . Heart disease Maternal Uncle   . Colon cancer Neg Hx    Allergies as of 10/19/2017      Reactions   Morphine Nausea And Vomiting   SEVERE   Sulfa Antibiotics Swelling      Medication List        Accurate as of 10/19/17  8:08 AM. Always use your most recent  med list.          acetaminophen 500 MG tablet Commonly known as:  TYLENOL Take 1,000 mg by mouth every 6 (six) hours as needed for headache.   atenolol 25 MG tablet Commonly known as:  TENORMIN Take 1 tablet (25 mg total) by mouth every morning.   calcium carbonate 600 MG Tabs tablet Commonly known as:  OS-CAL Take 600 mg by mouth daily.   cetirizine 10 MG tablet Commonly known as:  ZYRTEC Take 10 mg by mouth daily as needed for allergies.   cholecalciferol 1000 units tablet Commonly known as:  VITAMIN D Take 2 tablets (2,000 Units total) by mouth daily.   dicyclomine 10 MG capsule Commonly known as:  BENTYL Take 1 capsule (10 mg total) 4 (four) times daily -  before meals and at bedtime by mouth.   DULoxetine 60 MG capsule Commonly known as:  CYMBALTA Take 1 capsule (60 mg total) by mouth 2 (two) times daily.   ESTRACE VAGINAL 0.1 MG/GM vaginal cream Generic drug:  estradiol Estrace 0.01% (0.1 mg/gram) vaginal cream  1 gm q hs x14 then 1/2 gm 2x/wk   FISH OIL PO Take 1 capsule by mouth daily.   fluticasone 50 MCG/ACT nasal spray Commonly known as:  FLONASE Place into both nostrils daily.   folic acid 161 MCG tablet Commonly known as:  FOLVITE Take 800 mcg by mouth daily.   lisinopril-hydrochlorothiazide 10-12.5 MG tablet Commonly known as:  PRINZIDE,ZESTORETIC Take  1 tablet by mouth daily.   LORazepam 0.5 MG tablet Commonly known as:  ATIVAN Take 1 tablet (0.5 mg total) by mouth every 12 (twelve) hours as needed for anxiety (for panic attack).   omeprazole 20 MG capsule Commonly known as:  PRILOSEC TAKE 1 CAPSULE EVERY MORNING  30  MINUTES BEFORE A MEAL   pravastatin 40 MG tablet Commonly known as:  PRAVACHOL Take 1 tablet (40 mg total) by mouth daily.   RECLAST IV Inject into the vein.   sertraline 100 MG tablet Commonly known as:  ZOLOFT Take 1.5 tablets (150 mg total) by mouth daily.   traMADol 50 MG tablet Commonly known as:  ULTRAM Take 1 tablet (50 mg total) by mouth 2 (two) times daily as needed for moderate pain. TAKES 1 AT BEDTIME AND 1 DURING THE DAY ONLY IF NEEDED   traZODone 100 MG tablet Commonly known as:  DESYREL Take 1 tablet (100 mg total) by mouth at bedtime.   Turmeric Curcumin 500 MG Caps Take 1,000 mg by mouth daily.   Vitamin B-12 1000 MCG Subl Place 1 tablet under the tongue daily.   WOMENS MULTIVITAMIN PLUS PO Take 1 tablet by mouth daily.       No results found for this or any previous visit (from the past 24 hour(s)). No results found.   ROS: Negative, with the exception of above mentioned in HPI   Objective:  BP 101/68 (BP Location: Right Arm, Patient Position: Sitting, Cuff Size: Large)   Pulse 73   Temp 98 F (36.7 C) (Oral)   Wt 201 lb 12.8 oz (91.5 kg)   SpO2 95%   BMI 32.57 kg/m  Body mass index is 32.57 kg/m. Gen: Afebrile. No acute distress. Nontoxic in appearance, well-developed, well-nourished, very pleasant Caucasian female. HENT: AT. [place].  MMM.  Eyes:Pupils Equal Round Reactive to light, Extraocular movements intact,  Conjunctiva without redness, discharge or icterus. CV: RRR no murmur, no edema, +2/4 P posterior tibialis pulses Chest: CTAB, no wheeze  or crackles Abd: Soft.. NTND. BS present. No Masses palpated.  Neuro:  Normal gait. PERLA. EOMi. Alert. Oriented.  Psych:  Normal affect, dress and demeanor. Normal speech. Normal thought content and judgment..   Assessment/Plan: KINISHA SOPER is a 72 y.o. female present for OV for  ARABELA BASALDUA is a 72 y.o. female present for follow up  Depression with anxiety/insomnia/panic d/o - increase Zoloft to 200 mg daily.  - Continue Cymbalta 60 mg twice a day. - Continue trazodone 100 mg daily at bedtime. - Refill on Ativan provided for 6 months. [facility] database reviewed and appropriate. - Refills provided today on all the above medications. - Controlled substance contract signed. - F/U 6 months as long as doing well, sooner if needed.   Hypertension/morbid obesity/hyperlipidemia/CKD3: - Stable. Continue lisinopril/HCTZ, pravastatin and atenolol. Refills provided today on all medications. - BMP monitored yearly, up-to-date. - Continue low-sodium diet, exercise greater than 150 minutes a week. - Follow-up 6 months.  Arthritis/pain: - Tramadol refill today for arthritic pain. Her clinic chart substance database reviewed and appropriate. - Follow-up in 6 months if needing prescription refilled.  F/U 6 months  electronically signed by:  Howard Pouch, DO  [place]

## 2017-10-24 ENCOUNTER — Other Ambulatory Visit: Payer: Self-pay | Admitting: *Deleted

## 2017-10-24 DIAGNOSIS — E781 Pure hyperglyceridemia: Secondary | ICD-10-CM

## 2017-10-24 MED ORDER — PRAVASTATIN SODIUM 40 MG PO TABS: 40.0000 mg | ORAL_TABLET | Freq: Every day | ORAL | 1 refills | Status: DC

## 2017-11-17 ENCOUNTER — Telehealth: Payer: Self-pay | Admitting: Family Medicine

## 2017-11-17 DIAGNOSIS — R300 Dysuria: Secondary | ICD-10-CM | POA: Diagnosis not present

## 2017-11-17 NOTE — Telephone Encounter (Signed)
Copied from [place] [phone]. Topic: Quick Communication - See Telephone Encounter >> Nov 17, 2017  9:06 AM Ivar Drape wrote: CRM for notification. See Telephone encounter for:  11/17/17. Patient expressed she has a UTI and she wanted some medication called in for her.  She has a history of UTI's.

## 2017-11-17 NOTE — Telephone Encounter (Signed)
Spoke with patient explained she would need appt for evaluation . Scheduled patient to be seen she said she couldn't come today so scheduled for tomorrow in am

## 2017-11-18 ENCOUNTER — Ambulatory Visit: Payer: Medicare HMO | Admitting: Family Medicine

## 2017-12-12 ENCOUNTER — Ambulatory Visit: Payer: Medicare HMO

## 2017-12-28 DIAGNOSIS — M810 Age-related osteoporosis without current pathological fracture: Secondary | ICD-10-CM | POA: Diagnosis not present

## 2018-01-03 DIAGNOSIS — M810 Age-related osteoporosis without current pathological fracture: Secondary | ICD-10-CM | POA: Diagnosis not present

## 2018-01-13 NOTE — Progress Notes (Addendum)
Subjective:   [name] [name] is a 72 y.o. female who presents for Medicare Annual (Subsequent) preventive examination.  Review of Systems:  No ROS.  Medicare Wellness Visit. Additional risk factors are reflected in the social history.  Cardiac Risk Factors include: advanced age (>2men, >18 women);dyslipidemia;family history of premature cardiovascular disease;obesity (BMI >30kg/m2)   Sleep patterns: Sleeps 7-8 hours.  Home Safety/Smoke Alarms: Feels safe in home. Smoke alarms in place.  Living environment; residence and Firearm Safety: Lives alone in single story home. Seat Belt Safety/Bike Helmet: Wears seat belt.   Female:   Pap-N/A       Mammo-08/17/2017, benign. (US performed on L)      Dexa scan-07/26/2016, Osteoporosis.        CCS-Colonoscopy 03/17/2016, normal. No recall per report.       Objective:     Vitals: BP 114/62 (BP Location: Left Arm, Patient Position: Sitting, Cuff Size: Normal)   Pulse 65   Ht 5\' 6"  (1.676 m)   Wt 198 lb (89.8 kg)   SpO2 97%   BMI 31.96 kg/m   Body mass index is 31.96 kg/m.  Advanced Directives 01/16/2018 12/10/2016 10/20/2016 10/14/2016 03/03/2016 12/17/2013 08/27/2013  Does Patient Have a Medical Advance Directive? Yes Yes Yes Yes Yes Patient has advance directive, copy not in chart Patient has advance directive, copy not in chart  Type of Advance Directive Living will;Healthcare Power of [place];Living will [place];Living will [place];Living will [place];Living will Living will Living will  Does patient want to make changes to medical advance directive? - - No - Patient declined - No - Patient declined No change requested -  Copy of [place] in Chart? Yes Yes - Yes No - copy requested Copy requested from family -    Tobacco Social History   Tobacco Use  Smoking Status Former Smoker  . Packs/day: 1.00  . Years: 15.00  .  Pack years: 15.00  . Types: Cigarettes  . Start date: 12/06/1993  . Last attempt to quit: 08/09/2009  . Years since quitting: 8.4  Smokeless Tobacco Never Used  Tobacco Comment   quit smoking 5 years ago     Counseling given: Not Answered Comment: quit smoking 5 years ago    Past Medical History:  Diagnosis Date  . Anxiety   . Arthritis    oa  . Chronic cystitis   . Depression   . Fibromyalgia   . GERD (gastroesophageal reflux disease)   . Headache    sinus  . Heart murmur   . History of duodenal ulcer    2007  . History of gastritis    2007  . Hyperlipidemia   . Internal and external hemorrhoids without complication   . Intrinsic (urethral) sphincter deficiency (ISD)   . Irritable bowel syndrome   . Osteopenia   . Pernicious anemia   . PONV (postoperative nausea and vomiting)   . Unspecified essential hypertension   . Unspecified venous (peripheral) insufficiency    wears compression hose  . Unspecified vitamin D deficiency   . Urine incontinence    Past Surgical History:  Procedure Laterality Date  . ANTERIOR AND POSTERIOR VAGINAL REPAIR  04-29-2004   AND TRANSVAGINAL TAPE SLING  . ANTERIOR CERVICAL DECOMP/DISCECTOMY FUSION  1999  . APPENDECTOMY  1984  . BILATERAL SALPINGOOPHORECTOMY  1984  . CARDIAC CATHETERIZATION  10-03-2002   DR DEGENT   NORMAL CORONARIES ARTERIES/  EF 55%  . CYSTOSCOPY N/A 08/27/2013   Procedure: CYSTOSCOPY;  Surgeon: Ailene Rud, MD;  Location: [facility];  Service: Urology;  Laterality: N/A;  . CYSTOSCOPY WITH INJECTION N/A 12/17/2013   Procedure: Ascencion Dike WITH INJECTION;  Surgeon: Ailene Rud, MD;  Location: [facility];  Service: Urology;  Laterality: N/A;  . DILATION AND CURETTAGE OF UTERUS    . EXCISION RIGHT NECK AND LEFT BREAST SEBACEOUS CYST  05-18-2002  . HEMORRHOID BANDING  2014  . PUBOVAGINAL SLING N/A 10/25/2016   Procedure: Gaynelle Arabian;  Surgeon: Carolan Clines, MD;  Location: [facility];  Service: Urology;  Laterality: N/A;  . right litlle finger surgery  yrs ago   cyst removed x 4  . TOTAL ABDOMINAL HYSTERECTOMY  1986  . TUBAL LIGATION    . VAGINAL PROLAPSE REPAIR N/A 08/27/2013   Procedure: ANTERIOR VAGINAL VAULT SUSPENSION, KELLY PLICATION WITH SACROSPINOUS LIGAMENT FIXATION AND XENFORM BOVINE DERMIS GRAFT AUGMENTATION, URETHRAL EXPLORATION, URETHROLYSIS, EXPLANTATION OF TVT TAPE, IMPLANTATION OF FLOSEAL, IMPLANTATION OF XENFORM BOVINE GRAFT IN PERIURETHRAL SPACE;  Surgeon: Ailene Rud, MD;  Location: [place];  Service: Urology;  Laterality: N/A  . VAGINAL PROLAPSE REPAIR N/A 10/25/2016   Procedure: VAGINAL VAULT SUSPENSION with mesh and sacrospinous repair;  Surgeon: Carolan Clines, MD;  Location: [facility];  Service: Urology;  Laterality: N/A;   Family History  Problem Relation Age of Onset  . Lung cancer Father   . Coronary artery disease Father   . Hypertension Mother   . Heart disease Mother   . Myelodysplastic syndrome Mother   . Arthritis Mother   . Dementia Maternal Aunt   . Fibromyalgia Sister   . Hypertension Sister   . Anemia Sister   . Depression Sister   . Hypertension Brother   . Anemia Brother   . Hypertension Brother   . Hyperlipidemia Brother   . Colon polyps Unknown        aunt  . Heart disease Maternal Uncle   . Lung cancer Paternal Uncle   . Kidney disease Sister        kidney stones, removed kidney  . Dementia Maternal Aunt   . Lung cancer Maternal Uncle   . Cancer Paternal Uncle   . Colon cancer Neg Hx    Social History   Socioeconomic History  . Marital status: Divorced    Spouse name: Not on file  . Number of children: 1  . Years of education: Not on file  . Highest education level: Not on file  Occupational History  . Occupation: Retired  Scientific laboratory technician  . Financial resource strain: Not on file  . Food insecurity:    Worry: Not on file    Inability: Not on file   . Transportation needs:    Medical: Not on file    Non-medical: Not on file  Tobacco Use  . Smoking status: Former Smoker    Packs/day: 1.00    Years: 15.00    Pack years: 15.00    Types: Cigarettes    Start date: 12/06/1993    Last attempt to quit: 08/09/2009    Years since quitting: 8.4  . Smokeless tobacco: Never Used  . Tobacco comment: quit smoking 5 years ago  Substance and Sexual Activity  . Alcohol use: Yes    Comment: ocassional  . Drug use: No  . Sexual activity: Never  Lifestyle  . Physical activity:    Days per week: Not on  file    Minutes per session: Not on file  . Stress: Not on file  Relationships  . Social connections:    Talks on phone: Not on file    Gets together: Not on file    Attends religious service: Not on file    Active member of club or organization: Not on file    Attends meetings of clubs or organizations: Not on file    Relationship status: Not on file  Other Topics Concern  . Not on file  Social History Narrative  . Not on file    Outpatient Encounter Medications as of 01/16/2018  Medication Sig  . acetaminophen (TYLENOL) 500 MG tablet Take 1,000 mg by mouth every 6 (six) hours as needed for headache.  Marland Kitchen atenolol (TENORMIN) 25 MG tablet Take 1 tablet (25 mg total) by mouth every morning.  . calcium carbonate (OS-CAL) 600 MG TABS Take 600 mg by mouth daily.  . cetirizine (ZYRTEC) 10 MG tablet Take 10 mg by mouth daily as needed for allergies.  . cholecalciferol (VITAMIN D) 1000 UNITS tablet Take 2 tablets (2,000 Units total) by mouth daily.  . Cyanocobalamin (VITAMIN B-12) 1000 MCG SUBL Place 1 tablet under the tongue daily.   Marland Kitchen dicyclomine (BENTYL) 10 MG capsule Take 1 capsule (10 mg total) 4 (four) times daily -  before meals and at bedtime by mouth.  . DULoxetine (CYMBALTA) 60 MG capsule Take 1 capsule (60 mg total) by mouth 2 (two) times daily.  Marland Kitchen estradiol (ESTRACE VAGINAL) 0.1 MG/GM vaginal cream Estrace 0.01% (0.1 mg/gram) vaginal  cream  1 gm q hs x14 then 1/2 gm 2x/wk  . fluticasone (FLONASE) 50 MCG/ACT nasal spray Place into both nostrils as needed.   . folic acid (FOLVITE) 591 MCG tablet Take 800 mcg by mouth daily.   Marland Kitchen lisinopril-hydrochlorothiazide (PRINZIDE,ZESTORETIC) 10-12.5 MG tablet Take 1 tablet by mouth daily.  Marland Kitchen LORazepam (ATIVAN) 0.5 MG tablet Take 1 tablet (0.5 mg total) by mouth every 12 (twelve) hours as needed for up to 1 dose for anxiety (for panic attack).  . Multiple Vitamins-Minerals (WOMENS MULTIVITAMIN PLUS PO) Take 1 tablet by mouth daily.  . Omega-3 Fatty Acids (FISH OIL PO) Take 1 capsule by mouth daily.   Marland Kitchen omeprazole (PRILOSEC) 20 MG capsule TAKE 1 CAPSULE EVERY MORNING  30  MINUTES BEFORE A MEAL  . pravastatin (PRAVACHOL) 40 MG tablet Take 1 tablet (40 mg total) by mouth daily.  . sertraline (ZOLOFT) 100 MG tablet Take 2 tablets (200 mg total) by mouth daily.  . traMADol (ULTRAM) 50 MG tablet Take 1 tablet (50 mg total) by mouth 2 (two) times daily as needed for moderate pain. TAKES 1 AT BEDTIME AND 1 DURING THE DAY ONLY IF NEEDED  . traZODone (DESYREL) 100 MG tablet Take 1 tablet (100 mg total) by mouth at bedtime.  . Turmeric Curcumin 500 MG CAPS Take 1,000 mg by mouth daily.   . Zoledronic Acid (RECLAST IV) Inject into the vein.  . Influenza vac split quadrivalent PF (FLUZONE HIGH-DOSE) 0.5 ML injection Fluzone High-Dose 2016-2017 (PF) 180 mcg/0.5 mL intramuscular syringe  ADM 0.5ML IM UTD  . Zoster Vaccine Adjuvanted [facility]) injection Inject 0.5 mLs into the muscle once for 1 dose.   No facility-administered encounter medications on file as of 01/16/2018.     Activities of Daily Living In your present state of health, do you have any difficulty performing the following activities: 01/16/2018  Hearing? N  Vision? N  Difficulty concentrating  or making decisions? N  Walking or climbing stairs? N  Comment balance off?  Dressing or bathing? N  Doing errands, shopping? N  Preparing  Food and eating ? N  Using the Toilet? N  In the past six months, have you accidently leaked urine? N  Do you have problems with loss of bowel control? N  Managing your Medications? N  Managing your Finances? N  Housekeeping or managing your Housekeeping? N  Some recent data might be hidden    Patient Care Team: Ma Hillock, DO as PCP - General (Family Medicine) Gatha Mayer, MD as Consulting Physician (Gastroenterology) Calvert Cantor, MD as Consulting Physician (Ophthalmology) [facility] [place] [facility], [facility], [facility]    Assessment:   This is a routine wellness examination for [place].  Exercise Activities and Dietary recommendations Current Exercise Habits: The patient does not participate in regular exercise at present(maintains house and gardening), Exercise limited by: None identified   Diet (meal preparation, eat out, water intake, caffeinated beverages, dairy products, fruits and vegetables): Drinks decaf Coke, vanilla latte (2/day), coffee (1 cup) and water.   Brunch:egg sandwich Dinner: tuna; sandwich; soup; meat/veggies when eating out (3/week)     Goals    . Weight (lb) < 185 lb (83.9 kg)     Lose weight by increasing walking.        Fall Risk Fall Risk  01/16/2018 05/25/2017 12/10/2016 11/24/2015 09/11/2015  Falls in the past year? Yes No Yes No No  Number falls in past yr: 1 - 1 - -  Injury with Fall? No - No - -  Risk for fall due to : - - - History of fall(s) -  Follow up Falls prevention discussed - Falls prevention discussed - -      Depression Screen PHQ 2/9 Scores 01/16/2018 10/19/2017 05/25/2017 04/13/2017  PHQ - 2 Score 4 3 0 3  PHQ- 9 Score 12 8 3 9      Cognitive Function MMSE - Mini Mental State Exam 01/16/2018 11/24/2015  Orientation to time 5 5  Orientation to Place 5 5  Registration 3 3  Attention/ Calculation 5 5  Recall 3 3  Language- name 2 objects 2 2    Language- repeat 1 1  Language- follow 3 step command 3 3  Language- read & follow direction 1 1  Write a sentence 1 1  Copy design 1 1  Total score 30 30        Immunization History  Administered Date(s) Administered  . Influenza Split 07/02/2011  . Influenza Whole 07/16/2008, 07/04/2009, 07/14/2010  . Influenza,inj,Quad PF,6+ Mos 06/29/2013, 07/08/2014  . Influenza-Unspecified 07/02/2015  . Pneumococcal Conjugate-13 05/15/2014  . Pneumococcal Polysaccharide-23 10/04/2006, 09/11/2015  . Td 03/19/2010  . Zoster 07/25/2015     Screening Tests Health Maintenance  Topic Date Due  . INFLUENZA VACCINE  05/04/2018  . DEXA SCAN  07/27/2019  . MAMMOGRAM  08/18/2019  . TETANUS/TDAP  03/19/2020  . Hepatitis C Screening  Completed  . PNA vac Low Risk Adult  Completed        Plan:     Shingles vaccine at pharmacy.   Continue doing brain stimulating activities (puzzles, reading, adult coloring books, staying active) to keep memory sharp.   I have personally reviewed and noted the following in the patient's chart:   . Medical and social history . Use of alcohol, tobacco or illicit drugs  . Current medications and supplements . Functional ability  and status . Nutritional status . Physical activity . Advanced directives . List of other physicians . Hospitalizations, surgeries, and ER visits in previous 12 months . Vitals . Screenings to include cognitive, depression, and falls . Referrals and appointments  In addition, I have reviewed and discussed with patient certain preventive protocols, quality metrics, and best practice recommendations. A written personalized care plan for preventive services as well as general preventive health recommendations were provided to patient.     Gerilyn Nestle, RN  01/16/2018  PCP Notes:  -Pt fell approx 1 month ago, soreness in right arm and neck. Advised to make appt if symptom does not improve/worsens.   -PHQ9=12, pt states  mostly related to family communication issues. Also recently moved Aunt she was sitting with, to SNF.   -F/U with PCP 04/19/18 Medical screening examination/treatment/procedure(s) were performed by non-physician practitioner and as supervising physician I was immediately available for consultation/collaboration.  I agree with above assessment and plan.  Electronically Signed by: Howard Pouch, DO [facility]

## 2018-01-16 ENCOUNTER — Ambulatory Visit (INDEPENDENT_AMBULATORY_CARE_PROVIDER_SITE_OTHER): Payer: Medicare HMO

## 2018-01-16 ENCOUNTER — Other Ambulatory Visit: Payer: Self-pay

## 2018-01-16 VITALS — BP 114/62 | HR 65 | Ht 66.0 in | Wt 198.0 lb

## 2018-01-16 DIAGNOSIS — Z23 Encounter for immunization: Secondary | ICD-10-CM | POA: Diagnosis not present

## 2018-01-16 DIAGNOSIS — Z0000 Encounter for general adult medical examination without abnormal findings: Secondary | ICD-10-CM | POA: Diagnosis not present

## 2018-01-16 MED ORDER — ZOSTER VAC RECOMB ADJUVANTED 50 MCG/0.5ML IM SUSR: 0.5000 mL | Freq: Once | INTRAMUSCULAR | 1 refills | Status: AC

## 2018-01-16 NOTE — Patient Instructions (Addendum)
Shingles vaccine at pharmacy.   Continue doing brain stimulating activities (puzzles, reading, adult coloring books, staying active) to keep memory sharp.   Health Maintenance, Female Adopting a healthy lifestyle and getting preventive care can go a long way to promote health and wellness. Talk with your health care provider about what schedule of regular examinations is right for you. This is a good chance for you to check in with your provider about disease prevention and staying healthy. In between checkups, there are plenty of things you can do on your own. Experts have done a lot of research about which lifestyle changes and preventive measures are most likely to keep you healthy. Ask your health care provider for more information. Weight and diet Eat a healthy diet  Be sure to include plenty of vegetables, fruits, low-fat dairy products, and lean protein.  Do not eat a lot of foods high in solid fats, added sugars, or salt.  Get regular exercise. This is one of the most important things you can do for your health. ? Most adults should exercise for at least 150 minutes each week. The exercise should increase your heart rate and make you sweat (moderate-intensity exercise). ? Most adults should also do strengthening exercises at least twice a week. This is in addition to the moderate-intensity exercise.  Maintain a healthy weight  Body mass index (BMI) is a measurement that can be used to identify possible weight problems. It estimates body fat based on height and weight. Your health care provider can help determine your BMI and help you achieve or maintain a healthy weight.  For females 42 years of age and older: ? A BMI below 18.5 is considered underweight. ? A BMI of 18.5 to 24.9 is normal. ? A BMI of 25 to 29.9 is considered overweight. ? A BMI of 30 and above is considered obese.  Watch levels of cholesterol and blood lipids  You should start having your blood tested for lipids  and cholesterol at 71 years of age, then have this test every 5 years.  You may need to have your cholesterol levels checked more often if: ? Your lipid or cholesterol levels are high. ? You are older than 71 years of age. ? You are at high risk for heart disease.  Cancer screening Lung Cancer  Lung cancer screening is recommended for adults 73-56 years old who are at high risk for lung cancer because of a history of smoking.  A yearly low-dose CT scan of the lungs is recommended for people who: ? Currently smoke. ? Have quit within the past 15 years. ? Have at least a 30-pack-year history of smoking. A pack year is smoking an average of one pack of cigarettes a day for 1 year.  Yearly screening should continue until it has been 15 years since you quit.  Yearly screening should stop if you develop a health problem that would prevent you from having lung cancer treatment.  Breast Cancer  Practice breast self-awareness. This means understanding how your breasts normally appear and feel.  It also means doing regular breast self-exams. Let your health care provider know about any changes, no matter how small.  If you are in your 20s or 30s, you should have a clinical breast exam (CBE) by a health care provider every 1-3 years as part of a regular health exam.  If you are 58 or older, have a CBE every year. Also consider having a breast X-ray (mammogram) every year.  If  you have a family history of breast cancer, talk to your health care provider about genetic screening.  If you are at high risk for breast cancer, talk to your health care provider about having an MRI and a mammogram every year.  Breast cancer gene (BRCA) assessment is recommended for women who have family members with BRCA-related cancers. BRCA-related cancers include: ? Breast. ? Ovarian. ? Tubal. ? Peritoneal cancers.  Results of the assessment will determine the need for genetic counseling and BRCA1 and BRCA2  testing.  Cervical Cancer Your health care provider may recommend that you be screened regularly for cancer of the pelvic organs (ovaries, uterus, and vagina). This screening involves a pelvic examination, including checking for microscopic changes to the surface of your cervix (Pap test). You may be encouraged to have this screening done every 3 years, beginning at age 21.  For women ages 30-65, health care providers may recommend pelvic exams and Pap testing every 3 years, or they may recommend the Pap and pelvic exam, combined with testing for human papilloma virus (HPV), every 5 years. Some types of HPV increase your risk of cervical cancer. Testing for HPV may also be done on women of any age with unclear Pap test results.  Other health care providers may not recommend any screening for nonpregnant women who are considered low risk for pelvic cancer and who do not have symptoms. Ask your health care provider if a screening pelvic exam is right for you.  If you have had past treatment for cervical cancer or a condition that could lead to cancer, you need Pap tests and screening for cancer for at least 20 years after your treatment. If Pap tests have been discontinued, your risk factors (such as having a new sexual partner) need to be reassessed to determine if screening should resume. Some women have medical problems that increase the chance of getting cervical cancer. In these cases, your health care provider may recommend more frequent screening and Pap tests.  Colorectal Cancer  This type of cancer can be detected and often prevented.  Routine colorectal cancer screening usually begins at 71 years of age and continues through 71 years of age.  Your health care provider may recommend screening at an earlier age if you have risk factors for colon cancer.  Your health care provider may also recommend using home test kits to check for hidden blood in the stool.  A small camera at the end of a  tube can be used to examine your colon directly (sigmoidoscopy or colonoscopy). This is done to check for the earliest forms of colorectal cancer.  Routine screening usually begins at age 50.  Direct examination of the colon should be repeated every 5-10 years through 71 years of age. However, you may need to be screened more often if early forms of precancerous polyps or small growths are found.  Skin Cancer  Check your skin from head to toe regularly.  Tell your health care provider about any new moles or changes in moles, especially if there is a change in a mole's shape or color.  Also tell your health care provider if you have a mole that is larger than the size of a pencil eraser.  Always use sunscreen. Apply sunscreen liberally and repeatedly throughout the day.  Protect yourself by wearing long sleeves, pants, a wide-brimmed hat, and sunglasses whenever you are outside.  Heart disease, diabetes, and high blood pressure  High blood pressure causes heart disease and increases   the risk of stroke. High blood pressure is more likely to develop in: ? People who have blood pressure in the high end of the normal range (130-139/85-89 mm Hg). ? People who are overweight or obese. ? People who are African American.  If you are 18-39 years of age, have your blood pressure checked every 3-5 years. If you are 40 years of age or older, have your blood pressure checked every year. You should have your blood pressure measured twice-once when you are at a hospital or clinic, and once when you are not at a hospital or clinic. Record the average of the two measurements. To check your blood pressure when you are not at a hospital or clinic, you can use: ? An automated blood pressure machine at a pharmacy. ? A home blood pressure monitor.  If you are between 55 years and 79 years old, ask your health care provider if you should take aspirin to prevent strokes.  Have regular diabetes screenings. This  involves taking a blood sample to check your fasting blood sugar level. ? If you are at a normal weight and have a low risk for diabetes, have this test once every three years after 71 years of age. ? If you are overweight and have a high risk for diabetes, consider being tested at a younger age or more often. Preventing infection Hepatitis B  If you have a higher risk for hepatitis B, you should be screened for this virus. You are considered at high risk for hepatitis B if: ? You were born in a country where hepatitis B is common. Ask your health care provider which countries are considered high risk. ? Your parents were born in a high-risk country, and you have not been immunized against hepatitis B (hepatitis B vaccine). ? You have HIV or AIDS. ? You use needles to inject street drugs. ? You live with someone who has hepatitis B. ? You have had sex with someone who has hepatitis B. ? You get hemodialysis treatment. ? You take certain medicines for conditions, including cancer, organ transplantation, and autoimmune conditions.  Hepatitis C  Blood testing is recommended for: ? Everyone born from 1945 through 1965. ? Anyone with known risk factors for hepatitis C.  Sexually transmitted infections (STIs)  You should be screened for sexually transmitted infections (STIs) including gonorrhea and chlamydia if: ? You are sexually active and are younger than 71 years of age. ? You are older than 71 years of age and your health care provider tells you that you are at risk for this type of infection. ? Your sexual activity has changed since you were last screened and you are at an increased risk for chlamydia or gonorrhea. Ask your health care provider if you are at risk.  If you do not have HIV, but are at risk, it may be recommended that you take a prescription medicine daily to prevent HIV infection. This is called pre-exposure prophylaxis (PrEP). You are considered at risk if: ? You are  sexually active and do not regularly use condoms or know the HIV status of your partner(s). ? You take drugs by injection. ? You are sexually active with a partner who has HIV.  Talk with your health care provider about whether you are at high risk of being infected with HIV. If you choose to begin PrEP, you should first be tested for HIV. You should then be tested every 3 months for as long as you are taking PrEP.   Pregnancy  If you are premenopausal and you may become pregnant, ask your health care provider about preconception counseling.  If you may become pregnant, take 400 to 800 micrograms (mcg) of folic acid every day.  If you want to prevent pregnancy, talk to your health care provider about birth control (contraception). Osteoporosis and menopause  Osteoporosis is a disease in which the bones lose minerals and strength with aging. This can result in serious bone fractures. Your risk for osteoporosis can be identified using a bone density scan.  If you are 82 years of age or older, or if you are at risk for osteoporosis and fractures, ask your health care provider if you should be screened.  Ask your health care provider whether you should take a calcium or vitamin D supplement to lower your risk for osteoporosis.  Menopause may have certain physical symptoms and risks.  Hormone replacement therapy may reduce some of these symptoms and risks. Talk to your health care provider about whether hormone replacement therapy is right for you. Follow these instructions at home:  Schedule regular health, dental, and eye exams.  Stay current with your immunizations.  Do not use any tobacco products including cigarettes, chewing tobacco, or electronic cigarettes.  If you are pregnant, do not drink alcohol.  If you are breastfeeding, limit how much and how often you drink alcohol.  Limit alcohol intake to no more than 1 drink per day for nonpregnant women. One drink equals 12 ounces of  beer, 5 ounces of wine, or 1 ounces of hard liquor.  Do not use street drugs.  Do not share needles.  Ask your health care provider for help if you need support or information about quitting drugs.  Tell your health care provider if you often feel depressed.  Tell your health care provider if you have ever been abused or do not feel safe at home. This information is not intended to replace advice given to you by your health care provider. Make sure you discuss any questions you have with your health care provider. Document Released: 04/05/2011 Document Revised: 02/26/2016 Document Reviewed: 06/24/2015 Elsevier Interactive Patient Education  [employer]. 04/19/18

## 2018-03-22 ENCOUNTER — Telehealth: Payer: Self-pay

## 2018-03-22 DIAGNOSIS — R32 Unspecified urinary incontinence: Secondary | ICD-10-CM

## 2018-03-22 NOTE — Telephone Encounter (Signed)
Patient is requesting a referral for Insurance reasons she has already scheduled herself at [facility] appt is on 03/27/18. Please advise

## 2018-03-22 NOTE — Telephone Encounter (Signed)
Copied from [place] [phone]. Topic: General - Other >> Mar 22, 2018 10:13 AM Oneta Rack wrote: Relation to pt: self  Call back number:  [phone]  Reason for call:  Requesting referral to [facility] due to ongoing bladder concerns, patient scheduled with specialist for 03/27/18, please advise

## 2018-03-23 NOTE — Addendum Note (Signed)
Addended by: Howard Pouch A on: 03/23/2018 07:55 AM   Modules accepted: Orders

## 2018-03-23 NOTE — Telephone Encounter (Signed)
Left message with information on patient voice mail per [facility]

## 2018-03-23 NOTE — Telephone Encounter (Signed)
Approve urology referral placed for urinary incontinence. Please make pt aware

## 2018-03-27 DIAGNOSIS — R32 Unspecified urinary incontinence: Secondary | ICD-10-CM | POA: Diagnosis not present

## 2018-03-27 DIAGNOSIS — N952 Postmenopausal atrophic vaginitis: Secondary | ICD-10-CM | POA: Diagnosis not present

## 2018-03-31 ENCOUNTER — Other Ambulatory Visit: Payer: Self-pay

## 2018-03-31 DIAGNOSIS — G4700 Insomnia, unspecified: Secondary | ICD-10-CM

## 2018-03-31 MED ORDER — TRAZODONE HCL 100 MG PO TABS: 100.0000 mg | ORAL_TABLET | Freq: Every day | ORAL | 0 refills | Status: DC

## 2018-04-04 ENCOUNTER — Other Ambulatory Visit: Payer: Self-pay | Admitting: *Deleted

## 2018-04-04 DIAGNOSIS — G4700 Insomnia, unspecified: Secondary | ICD-10-CM

## 2018-04-04 MED ORDER — TRAZODONE HCL 100 MG PO TABS: 100.0000 mg | ORAL_TABLET | Freq: Every day | ORAL | 0 refills | Status: DC

## 2018-04-04 NOTE — Telephone Encounter (Signed)
Trazodone refilled for 30 day supply to local pharmacy.

## 2018-04-19 ENCOUNTER — Ambulatory Visit (INDEPENDENT_AMBULATORY_CARE_PROVIDER_SITE_OTHER): Payer: Medicare HMO | Admitting: Family Medicine

## 2018-04-19 ENCOUNTER — Encounter: Payer: Self-pay | Admitting: Family Medicine

## 2018-04-19 VITALS — BP 119/81 | HR 69 | Temp 97.9°F | Resp 20 | Ht 66.0 in | Wt 196.5 lb

## 2018-04-19 DIAGNOSIS — M19041 Primary osteoarthritis, right hand: Secondary | ICD-10-CM | POA: Diagnosis not present

## 2018-04-19 DIAGNOSIS — R002 Palpitations: Secondary | ICD-10-CM

## 2018-04-19 DIAGNOSIS — M19079 Primary osteoarthritis, unspecified ankle and foot: Secondary | ICD-10-CM | POA: Diagnosis not present

## 2018-04-19 DIAGNOSIS — R490 Dysphonia: Secondary | ICD-10-CM

## 2018-04-19 DIAGNOSIS — I10 Essential (primary) hypertension: Secondary | ICD-10-CM

## 2018-04-19 DIAGNOSIS — G4700 Insomnia, unspecified: Secondary | ICD-10-CM

## 2018-04-19 DIAGNOSIS — F418 Other specified anxiety disorders: Secondary | ICD-10-CM

## 2018-04-19 DIAGNOSIS — M1712 Unilateral primary osteoarthritis, left knee: Secondary | ICD-10-CM

## 2018-04-19 DIAGNOSIS — F410 Panic disorder [episodic paroxysmal anxiety] without agoraphobia: Secondary | ICD-10-CM

## 2018-04-19 DIAGNOSIS — E781 Pure hyperglyceridemia: Secondary | ICD-10-CM

## 2018-04-19 DIAGNOSIS — M19042 Primary osteoarthritis, left hand: Secondary | ICD-10-CM

## 2018-04-19 LAB — COMPREHENSIVE METABOLIC PANEL
ALBUMIN: 4.2 g/dL (ref 3.5–5.2)
ALK PHOS: 50 U/L (ref 39–117)
ALT: 11 U/L (ref 0–35)
AST: 12 U/L (ref 0–37)
BUN: 15 mg/dL (ref 6–23)
CALCIUM: 9.5 mg/dL (ref 8.4–10.5)
CO2: 31 mEq/L (ref 19–32)
Chloride: 104 mEq/L (ref 96–112)
Creatinine, Ser: 1.08 mg/dL (ref 0.40–1.20)
GFR: 53.14 mL/min — ABNORMAL LOW (ref >60.00)
Glucose, Bld: 91 mg/dL (ref 70–99)
POTASSIUM: 3.9 meq/L (ref 3.5–5.1)
SODIUM: 142 meq/L (ref 135–145)
TOTAL PROTEIN: 6.7 g/dL (ref 6.0–8.3)
Total Bilirubin: 0.5 mg/dL (ref 0.2–1.2)

## 2018-04-19 LAB — TSH: TSH: 5.86 u[IU]/mL — ABNORMAL HIGH (ref 0.35–4.50)

## 2018-04-19 MED ORDER — PRAVASTATIN SODIUM 40 MG PO TABS: 40.0000 mg | ORAL_TABLET | Freq: Every day | ORAL | 3 refills | Status: DC

## 2018-04-19 MED ORDER — SERTRALINE HCL 100 MG PO TABS: 200.0000 mg | ORAL_TABLET | Freq: Every day | ORAL | 1 refills | Status: DC

## 2018-04-19 MED ORDER — TRAMADOL HCL 50 MG PO TABS: 50.0000 mg | ORAL_TABLET | Freq: Two times a day (BID) | ORAL | 1 refills | Status: DC | PRN

## 2018-04-19 MED ORDER — ATENOLOL 25 MG PO TABS: 25.0000 mg | ORAL_TABLET | Freq: Every morning | ORAL | 1 refills | Status: DC

## 2018-04-19 MED ORDER — LORAZEPAM 0.5 MG PO TABS: 0.5000 mg | ORAL_TABLET | Freq: Two times a day (BID) | ORAL | 5 refills | Status: DC | PRN

## 2018-04-19 MED ORDER — DULOXETINE HCL 60 MG PO CPEP: 60.0000 mg | ORAL_CAPSULE | Freq: Two times a day (BID) | ORAL | 1 refills | Status: DC

## 2018-04-19 MED ORDER — LISINOPRIL-HYDROCHLOROTHIAZIDE 10-12.5 MG PO TABS: 1.0000 | ORAL_TABLET | Freq: Every day | ORAL | 1 refills | Status: DC

## 2018-04-19 MED ORDER — TRAZODONE HCL 100 MG PO TABS: 100.0000 mg | ORAL_TABLET | Freq: Every day | ORAL | 1 refills | Status: DC

## 2018-04-19 MED ORDER — DICYCLOMINE HCL 10 MG PO CAPS: 10.0000 mg | ORAL_CAPSULE | Freq: Three times a day (TID) | ORAL | 5 refills | Status: DC

## 2018-04-19 NOTE — Progress Notes (Signed)
[name] [name] , [birth year], 72 y.o., female MRN: [id number] Patient Care Team    Relationship Specialty Notifications Start End  Ma Hillock, DO PCP - General Family Medicine  06/02/15   Gatha Mayer, MD Consulting Physician Gastroenterology  11/24/15   Calvert Cantor, MD Consulting Physician Ophthalmology  11/24/15   Specialists, [facility]  Orthopedic Surgery  04/12/16   Pa, [facility]    01/16/18   Rheumatology, [facility]    01/16/18     Chief Complaint  Patient presents with  . Hypertension  . Depression  . Anxiety    Subjective:   Hypertension/hyperlipidemia/CKD3:  Pt reports compliance with Pravastatin 40 mg, lisinopril/HCTZ 10-12.5 mg and atenolol 25 mg. Blood pressures ranges at home within normal limits. Patient denies chest pain, shortness of breath, dizziness or lower extremity edema.  She does endorse palpitations that occur mostly at night, take her breath away briefly for 1 to 2 seconds.  She denies caffeine use more than usual, usually 1 to 2 cups of caffeine in the morning. Pt does not take daily baby ASA. Pt is  prescribed statin. BMP: 04/19/2018 GFR 53 CBC: 05/25/2017, hemoglobin 11.8, hematocrit 37(chronic) Lipids: 05/25/2017 within normal limits TSH: 04/19/2018 5.86 H Diet: Low-sodium Exercise: Attempts RF: Hypertension, hyperlipidemia. Family history of heart disease, former smoker, obesity  Anxiety/panic attack: Patient reports compliance with Zoloft 200 mg daily, Cymbalta twice a day.  She is currently without a job which is causing her some increased anxiety.  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  She does not feel her family stays in touch with her as much as she would like. She feels like she is the one initiates contact with her family members and this is upsetting to her. She was offered referral to psychology, and she declined last visit (once she was called to schedule)  She does use the Ativan at least once a day sometimes twice.  She endorses a flutter or palpitations that have been occurring over the last month it mostly always at nighttime.  States it lasts just for 1 to 2 seconds, she feels it secondary to her increased anxiety.  Insomnia: Patient reports compliance 100 Mg of Trazodone Daily at Bedtime. She Sleeps Well on This Medication, She Has No Side Effects. She Would like Refills.    Arthritis/pain: Patient uses tramadol 50 mg twice a day when necessary for arthritic pain. She is in need of refills today. Indication for chronic opioid: Moderate to severe arthritis and osteoporosis discomfort Medication and dose: Tramadol 50 mg twice daily as needed # pills per 3 months: #180 Last UDS date: n/a Pain contract signed (Y/N): Yes Date narcotic database last reviewed (include red flags): 04/20/18   Hoarseness: She reports she has had hoarseness for about 5 years that she noticed started after she had a bad case of laryngitis.  Voice has changed since that time, however she feels over the last 6 months her voice has increasingly become more hoarse.  SHe does have a history of GERD and takes PPI daily.  SHe was a former smoker, quit about 8 years ago smoked a total of approximately 25 years.  Depression screen [facility] 2/9 04/19/2018 01/16/2018 10/19/2017  Decreased Interest 0 3 2  Down, Depressed, Hopeless 0 1 1  PHQ - 2 Score 0 4 3  Altered sleeping 1 3 2   Tired, decreased energy 1 3 2   Change in appetite  0 2 1  Feeling bad or failure about yourself  0 0 0  Trouble concentrating 0 0 0  Moving slowly or fidgety/restless 0 0 0  Suicidal thoughts 0 0 0  PHQ-9 Score 2 12 8   Difficult doing work/chores - Somewhat difficult Somewhat difficult   GAD 7 : Generalized Anxiety Score 04/19/2018 10/19/2017 04/13/2017  Nervous, Anxious, on Edge 2 3 1   Control/stop worrying 1 2 1   Worry too much - different things 1 3 0  Trouble relaxing 1 3 2   Restless 1 0 0  Easily  annoyed or irritable 0 0 0  Afraid - awful might happen 1 1 0  Total GAD 7 Score 7 12 4   Anxiety Difficulty - Somewhat difficult -    Allergies  Allergen Reactions  . Morphine Nausea And Vomiting    SEVERE  . Sulfa Antibiotics Swelling   Social History   Tobacco Use  . Smoking status: Former Smoker    Packs/day: 1.00    Years: 15.00    Pack years: 15.00    Types: Cigarettes    Start date: 12/06/1993    Last attempt to quit: 08/09/2009    Years since quitting: 8.6  . Smokeless tobacco: Never Used  . Tobacco comment: quit smoking 5 years ago  Substance Use Topics  . Alcohol use: Yes    Comment: ocassional   Past Medical History:  Diagnosis Date  . Anxiety   . Arthritis    oa  . Chronic cystitis   . Depression   . Fibromyalgia   . GERD (gastroesophageal reflux disease)   . Headache    sinus  . Heart murmur   . History of duodenal ulcer    2007  . History of gastritis    2007  . Hyperlipidemia   . Internal and external hemorrhoids without complication   . Intrinsic (urethral) sphincter deficiency (ISD)   . Irritable bowel syndrome   . Osteopenia   . Pernicious anemia   . PONV (postoperative nausea and vomiting)   . Unspecified essential hypertension   . Unspecified venous (peripheral) insufficiency    wears compression hose  . Unspecified vitamin D deficiency   . Urine incontinence    Past Surgical History:  Procedure Laterality Date  . ANTERIOR AND POSTERIOR VAGINAL REPAIR  04-29-2004   AND TRANSVAGINAL TAPE SLING  . ANTERIOR CERVICAL DECOMP/DISCECTOMY FUSION  1999  . APPENDECTOMY  1984  . BILATERAL SALPINGOOPHORECTOMY  1984  . CARDIAC CATHETERIZATION  10-03-2002   DR DEGENT   NORMAL CORONARIES ARTERIES/  EF 55%  . CYSTOSCOPY N/A 08/27/2013   Procedure: CYSTOSCOPY;  Surgeon: Ailene Rud, MD;  Location: [facility];  Service: Urology;  Laterality: N/A;  . CYSTOSCOPY WITH INJECTION N/A 12/17/2013   Procedure: Ascencion Dike WITH  INJECTION;  Surgeon: Ailene Rud, MD;  Location[facility];  Service: Urology;  Laterality: N/A;  . DILATION AND CURETTAGE OF UTERUS    . EXCISION RIGHT NECK AND LEFT BREAST SEBACEOUS CYST  05-18-2002  . HEMORRHOID BANDING  2014  . PUBOVAGINAL SLING N/A 10/25/2016   Procedure: Gaynelle Arabian;  Surgeon: Carolan Clines, MD;  Location: [facility];  Service: Urology;  Laterality: N/A;  . right litlle finger surgery  yrs ago   cyst removed x 4  . TOTAL ABDOMINAL HYSTERECTOMY  1986  . TUBAL LIGATION    . VAGINAL PROLAPSE REPAIR N/A 08/27/2013   Procedure: ANTERIOR VAGINAL VAULT SUSPENSION, KELLY PLICATION WITH SACROSPINOUS LIGAMENT FIXATION  AND XENFORM BOVINE DERMIS GRAFT AUGMENTATION, URETHRAL EXPLORATION, URETHROLYSIS, EXPLANTATION OF TVT TAPE, IMPLANTATION OF FLOSEAL, IMPLANTATION OF XENFORM BOVINE GRAFT IN PERIURETHRAL SPACE;  Surgeon: Ailene Rud, MD;  Location: [place];  Service: Urology;  Laterality: N/A  . VAGINAL PROLAPSE REPAIR N/A 10/25/2016   Procedure: VAGINAL VAULT SUSPENSION with mesh and sacrospinous repair;  Surgeon: Carolan Clines, MD;  Location: [facility];  Service: Urology;  Laterality: N/A;   Family History  Problem Relation Age of Onset  . Lung cancer Father   . Coronary artery disease Father   . Hypertension Mother   . Heart disease Mother   . Myelodysplastic syndrome Mother   . Arthritis Mother   . Dementia Maternal Aunt   . Fibromyalgia Sister   . Hypertension Sister   . Anemia Sister   . Depression Sister   . Hypertension Brother   . Anemia Brother   . Hypertension Brother   . Hyperlipidemia Brother   . Colon polyps Unknown        aunt  . Heart disease Maternal Uncle   . Lung cancer Paternal Uncle   . Kidney disease Sister        kidney stones, removed kidney  . Dementia Maternal Aunt   . Lung cancer Maternal Uncle   . Cancer Paternal Uncle   . Colon cancer Neg Hx    Allergies as of 04/19/2018       Reactions   Morphine Nausea And Vomiting   SEVERE   Sulfa Antibiotics Swelling      Medication List        Accurate as of 04/19/18  8:12 AM. Always use your most recent med list.          acetaminophen 500 MG tablet Commonly known as:  TYLENOL Take 1,000 mg by mouth every 6 (six) hours as needed for headache.   atenolol 25 MG tablet Commonly known as:  TENORMIN Take 1 tablet (25 mg total) by mouth every morning.   calcium carbonate 600 MG Tabs tablet Commonly known as:  OS-CAL Take 600 mg by mouth daily.   cetirizine 10 MG tablet Commonly known as:  ZYRTEC Take 10 mg by mouth daily as needed for allergies.   cholecalciferol 1000 units tablet Commonly known as:  VITAMIN D Take 2 tablets (2,000 Units total) by mouth daily.   dicyclomine 10 MG capsule Commonly known as:  BENTYL Take 1 capsule (10 mg total) 4 (four) times daily -  before meals and at bedtime by mouth.   DULoxetine 60 MG capsule Commonly known as:  CYMBALTA Take 1 capsule (60 mg total) by mouth 2 (two) times daily.   ESTRACE VAGINAL 0.1 MG/GM vaginal cream Generic drug:  estradiol Estrace 0.01% (0.1 mg/gram) vaginal cream  1 gm q hs x14 then 1/2 gm 2x/wk   FISH OIL PO Take 1 capsule by mouth daily.   fluticasone 50 MCG/ACT nasal spray Commonly known as:  FLONASE Place into both nostrils as needed.   folic acid 859 MCG tablet Commonly known as:  FOLVITE Take 800 mcg by mouth daily.   Influenza vac split quadrivalent PF 0.5 ML injection Commonly known as:  FLUZONE HIGH-DOSE Fluzone High-Dose 2016-2017 (PF) 180 mcg/0.5 mL intramuscular syringe  ADM 0.5ML IM UTD   lisinopril-hydrochlorothiazide 10-12.5 MG tablet Commonly known as:  PRINZIDE,ZESTORETIC Take 1 tablet by mouth daily.   LORazepam 0.5 MG tablet Commonly known as:  ATIVAN Take 1 tablet (0.5 mg total) by mouth every 12 (twelve)  hours as needed for up to 1 dose for anxiety (for panic attack).   omeprazole 20 MG capsule Commonly  known as:  PRILOSEC TAKE 1 CAPSULE EVERY MORNING  30  MINUTES BEFORE A MEAL   pravastatin 40 MG tablet Commonly known as:  PRAVACHOL Take 1 tablet (40 mg total) by mouth daily.   RECLAST IV Inject into the vein.   sertraline 100 MG tablet Commonly known as:  ZOLOFT Take 2 tablets (200 mg total) by mouth daily.   traMADol 50 MG tablet Commonly known as:  ULTRAM Take 1 tablet (50 mg total) by mouth 2 (two) times daily as needed for moderate pain. TAKES 1 AT BEDTIME AND 1 DURING THE DAY ONLY IF NEEDED   traZODone 100 MG tablet Commonly known as:  DESYREL Take 1 tablet (100 mg total) by mouth at bedtime.   Turmeric Curcumin 500 MG Caps Take 1,000 mg by mouth daily.   Vitamin B-12 1000 MCG Subl Place 1 tablet under the tongue daily.   WOMENS MULTIVITAMIN PLUS PO Take 1 tablet by mouth daily.       No results found for this or any previous visit (from the past 24 hour(s)). No results found.   ROS: Negative, with the exception of above mentioned in HPI   Objective:  BP 119/81 (BP Location: Right Arm, Patient Position: Sitting, Cuff Size: Large)   Pulse 69   Temp 97.9 F (36.6 C)   Resp 20   Ht 5\' 6"  (1.676 m)   Wt 196 lb 8 oz (89.1 kg)   SpO2 97%   BMI 31.72 kg/m  Body mass index is 31.72 kg/m. Gen: Afebrile. No acute distress.  Nontoxic and presentation.  Well-developed, well-nourished, very pleasant obese Caucasian female. HENT: AT. [place]. Bilateral TM visualized and normal in appearance. MMM. Bilateral nares without erythema, swelling or drainage. Throat without erythema or exudates.  No cough.  Moderate hoarseness present. Eyes:Pupils Equal Round Reactive to light, Extraocular movements intact,  Conjunctiva without redness, discharge or icterus. Neck/lymp/endocrine: Supple, no lymphadenopathy, no thyromegaly CV: RRR no murmur, no edema, +2/4 P posterior tibialis pulses Chest: CTAB, no wheeze or crackles Abd: Soft.  Obese. NTND. BS active.  No masses palpated.    MSK:, No obvious deformities. Skin: No rashes, purpura or petechiae.  Neuro:  Normal gait. PERLA. EOMi. Alert. Oriented.  Psych: Normal affect, dress and demeanor. Normal speech. Normal thought content and judgment.  Assessment/Plan: [name] [name] is a 72 y.o. female present for OV for  [name] [name] is a 72 y.o. female present for follow up  Depression with anxiety/insomnia/panic d/o - stable.  -Continue Zoloft to 200 mg daily.  - Continue Cymbalta 60 mg twice a day. - Continue trazodone 100 mg daily at bedtime. - Refill on Ativan provided for 6 months. [place]S database reviewed and appropriate 04/19/18 - Refills provided today on all the above medications. - Controlled substance contract signed. - F/U 6 months as long as doing well, sooner if needed.   Hypertension/morbid obesity/hyperlipidemia/CKD3/palpitations: -Stable. Continue lisinopril/HCTZ, pravastatin and atenolol. Refills provided today on all medications. - CMP and TSH collected today 2/2 reports of palpitations. Could be anxiety related as well.  - Continue low-sodium diet, exercise greater than 150 minutes a week. - Follow-up 6 months.  Arthritis/pain: -Stable tramadol refill today for arthritic pain. Her clinic chart substance database reviewed and appropriate. - Follow-up in 6 months if needing prescription refilled.  Hoarseness:  - present > 5 yrs, worsening last 6 mos  in a former smoker.  - Continue daily PPI and antihistamine. - referral to ENT for further eval.    F/U 6 months  electronically signed by:  Howard Pouch, DO  [place]

## 2018-04-19 NOTE — Patient Instructions (Signed)
It was great to see you today.  Refilled meds.  Referred to ENT for throat   Followup 6 mos for chronic medical conditions and yearly for physical.

## 2018-04-20 ENCOUNTER — Encounter: Payer: Self-pay | Admitting: Family Medicine

## 2018-04-20 DIAGNOSIS — R002 Palpitations: Secondary | ICD-10-CM | POA: Insufficient documentation

## 2018-04-20 HISTORY — DX: Palpitations: R00.2

## 2018-05-10 DIAGNOSIS — N3946 Mixed incontinence: Secondary | ICD-10-CM | POA: Diagnosis not present

## 2018-05-10 DIAGNOSIS — N3020 Other chronic cystitis without hematuria: Secondary | ICD-10-CM | POA: Diagnosis not present

## 2018-05-11 DIAGNOSIS — Z87891 Personal history of nicotine dependence: Secondary | ICD-10-CM | POA: Diagnosis not present

## 2018-05-11 DIAGNOSIS — E039 Hypothyroidism, unspecified: Secondary | ICD-10-CM | POA: Diagnosis not present

## 2018-05-11 DIAGNOSIS — R490 Dysphonia: Secondary | ICD-10-CM | POA: Diagnosis not present

## 2018-05-11 DIAGNOSIS — Z7289 Other problems related to lifestyle: Secondary | ICD-10-CM | POA: Diagnosis not present

## 2018-05-26 ENCOUNTER — Encounter: Payer: Self-pay | Admitting: Family Medicine

## 2018-05-26 ENCOUNTER — Ambulatory Visit (INDEPENDENT_AMBULATORY_CARE_PROVIDER_SITE_OTHER): Payer: Medicare HMO | Admitting: Family Medicine

## 2018-05-26 VITALS — BP 121/76 | HR 72 | Temp 98.1°F | Resp 16 | Ht 66.0 in | Wt 196.0 lb

## 2018-05-26 DIAGNOSIS — E781 Pure hyperglyceridemia: Secondary | ICD-10-CM

## 2018-05-26 DIAGNOSIS — I10 Essential (primary) hypertension: Secondary | ICD-10-CM

## 2018-05-26 DIAGNOSIS — R002 Palpitations: Secondary | ICD-10-CM

## 2018-05-26 DIAGNOSIS — E559 Vitamin D deficiency, unspecified: Secondary | ICD-10-CM | POA: Diagnosis not present

## 2018-05-26 DIAGNOSIS — R799 Abnormal finding of blood chemistry, unspecified: Secondary | ICD-10-CM | POA: Diagnosis not present

## 2018-05-26 DIAGNOSIS — Z1231 Encounter for screening mammogram for malignant neoplasm of breast: Secondary | ICD-10-CM

## 2018-05-26 DIAGNOSIS — Z0001 Encounter for general adult medical examination with abnormal findings: Secondary | ICD-10-CM | POA: Diagnosis not present

## 2018-05-26 DIAGNOSIS — R7309 Other abnormal glucose: Secondary | ICD-10-CM

## 2018-05-26 DIAGNOSIS — R490 Dysphonia: Secondary | ICD-10-CM

## 2018-05-26 DIAGNOSIS — N183 Chronic kidney disease, stage 3 unspecified: Secondary | ICD-10-CM

## 2018-05-26 DIAGNOSIS — M810 Age-related osteoporosis without current pathological fracture: Secondary | ICD-10-CM | POA: Diagnosis not present

## 2018-05-26 DIAGNOSIS — Z1239 Encounter for other screening for malignant neoplasm of breast: Secondary | ICD-10-CM

## 2018-05-26 LAB — CBC WITH DIFFERENTIAL/PLATELET
BASOS ABS: 0.1 10*3/uL (ref 0.0–0.1)
Basophils Relative: 1 % (ref 0.0–3.0)
EOS ABS: 0.3 10*3/uL (ref 0.0–0.7)
Eosinophils Relative: 2.9 % (ref 0.0–5.0)
HEMATOCRIT: 37.3 % (ref 36.0–46.0)
HEMOGLOBIN: 12 g/dL (ref 12.0–15.0)
LYMPHS PCT: 25.8 % (ref 12.0–46.0)
Lymphs Abs: 2.2 10*3/uL (ref 0.7–4.0)
MCHC: 32.3 g/dL (ref 30.0–36.0)
MCV: 87.9 fl (ref 78.0–100.0)
MONOS PCT: 7.5 % (ref 3.0–12.0)
Monocytes Absolute: 0.7 10*3/uL (ref 0.1–1.0)
NEUTROS ABS: 5.4 10*3/uL (ref 1.4–7.7)
Neutrophils Relative %: 62.8 % (ref 43.0–77.0)
PLATELETS: 282 10*3/uL (ref 150.0–400.0)
RBC: 4.24 Mil/uL (ref 3.87–5.11)
RDW: 13.8 % (ref 11.5–15.5)
WBC: 8.7 10*3/uL (ref 4.0–10.5)

## 2018-05-26 LAB — LIPID PANEL
CHOL/HDL RATIO: 4
Cholesterol: 169 mg/dL (ref 0–200)
HDL: 44.9 mg/dL (ref >39.00)
LDL Cholesterol: 87 mg/dL (ref 0–99)
NonHDL: 124.36
TRIGLYCERIDES: 186 mg/dL — AB (ref 0.0–149.0)
VLDL: 37.2 mg/dL (ref 0.0–40.0)

## 2018-05-26 LAB — HEMOGLOBIN A1C: Hgb A1c MFr Bld: 5.5 % (ref 4.6–6.5)

## 2018-05-26 LAB — VITAMIN D 25 HYDROXY (VIT D DEFICIENCY, FRACTURES): VITD: 42.62 ng/mL (ref 30.00–100.00)

## 2018-05-26 LAB — TSH: TSH: 2.42 u[IU]/mL (ref 0.35–4.50)

## 2018-05-26 LAB — T4, FREE: FREE T4: 0.64 ng/dL (ref 0.60–1.60)

## 2018-05-26 MED ORDER — ZOSTER VAC RECOMB ADJUVANTED 50 MCG/0.5ML IM SUSR: 0.5000 mL | Freq: Once | INTRAMUSCULAR | 1 refills | Status: AC

## 2018-05-26 MED ORDER — LOSARTAN POTASSIUM-HCTZ 50-12.5 MG PO TABS
1.0000 | ORAL_TABLET | Freq: Every day | ORAL | 1 refills | Status: DC
Start: 2018-05-26 — End: 2018-10-24

## 2018-05-26 NOTE — Patient Instructions (Addendum)
Stop lisinopril when losartan comes in. See if that make your hoarseness resolve.  They will call you to schedule your mammogram which is due in November. Reprinted shingles vaccine for you, let us know f you get them.   High dose Flu shots come in the next 1-2 weeks--> call in to see we have them and you can via nurse visit.    Health Maintenance, Female Adopting a healthy lifestyle and getting preventive care can go a long way to promote health and wellness. Talk with your health care provider about what schedule of regular examinations is right for you. This is a good chance for you to check in with your provider about disease prevention and staying healthy. In between checkups, there are plenty of things you can do on your own. Experts have done a lot of research about which lifestyle changes and preventive measures are most likely to keep you healthy. Ask your health care provider for more information. Weight and diet Eat a healthy diet  Be sure to include plenty of vegetables, fruits, low-fat dairy products, and lean protein.  Do not eat a lot of foods high in solid fats, added sugars, or salt.  Get regular exercise. This is one of the most important things you can do for your health. ? Most adults should exercise for at least 150 minutes each week. The exercise should increase your heart rate and make you sweat (moderate-intensity exercise). ? Most adults should also do strengthening exercises at least twice a week. This is in addition to the moderate-intensity exercise.  Maintain a healthy weight  Body mass index (BMI) is a measurement that can be used to identify possible weight problems. It estimates body fat based on height and weight. Your health care provider can help determine your BMI and help you achieve or maintain a healthy weight.  For females 12 years of age and older: ? A BMI below 18.5 is considered underweight. ? A BMI of 18.5 to 24.9 is normal. ? A BMI of 25 to  29.9 is considered overweight. ? A BMI of 30 and above is considered obese.  Watch levels of cholesterol and blood lipids  You should start having your blood tested for lipids and cholesterol at 71 years of age, then have this test every 5 years.  You may need to have your cholesterol levels checked more often if: ? Your lipid or cholesterol levels are high. ? You are older than 71 years of age. ? You are at high risk for heart disease.  Cancer screening Lung Cancer  Lung cancer screening is recommended for adults 8-57 years old who are at high risk for lung cancer because of a history of smoking.  A yearly low-dose CT scan of the lungs is recommended for people who: ? Currently smoke. ? Have quit within the past 15 years. ? Have at least a 30-pack-year history of smoking. A pack year is smoking an average of one pack of cigarettes a day for 1 year.  Yearly screening should continue until it has been 15 years since you quit.  Yearly screening should stop if you develop a health problem that would prevent you from having lung cancer treatment.  Breast Cancer  Practice breast self-awareness. This means understanding how your breasts normally appear and feel.  It also means doing regular breast self-exams. Let your health care provider know about any changes, no matter how small.  If you are in your 20s or 30s, you should have  a clinical breast exam (CBE) by a health care provider every 1-3 years as part of a regular health exam.  If you are 63 or older, have a CBE every year. Also consider having a breast X-ray (mammogram) every year.  If you have a family history of breast cancer, talk to your health care provider about genetic screening.  If you are at high risk for breast cancer, talk to your health care provider about having an MRI and a mammogram every year.  Breast cancer gene (BRCA) assessment is recommended for women who have family members with BRCA-related cancers.  BRCA-related cancers include: ? Breast. ? Ovarian. ? Tubal. ? Peritoneal cancers.  Results of the assessment will determine the need for genetic counseling and BRCA1 and BRCA2 testing.  Cervical Cancer Your health care provider may recommend that you be screened regularly for cancer of the pelvic organs (ovaries, uterus, and vagina). This screening involves a pelvic examination, including checking for microscopic changes to the surface of your cervix (Pap test). You may be encouraged to have this screening done every 3 years, beginning at age 11.  For women ages 19-65, health care providers may recommend pelvic exams and Pap testing every 3 years, or they may recommend the Pap and pelvic exam, combined with testing for human papilloma virus (HPV), every 5 years. Some types of HPV increase your risk of cervical cancer. Testing for HPV may also be done on women of any age with unclear Pap test results.  Other health care providers may not recommend any screening for nonpregnant women who are considered low risk for pelvic cancer and who do not have symptoms. Ask your health care provider if a screening pelvic exam is right for you.  If you have had past treatment for cervical cancer or a condition that could lead to cancer, you need Pap tests and screening for cancer for at least 20 years after your treatment. If Pap tests have been discontinued, your risk factors (such as having a new sexual partner) need to be reassessed to determine if screening should resume. Some women have medical problems that increase the chance of getting cervical cancer. In these cases, your health care provider may recommend more frequent screening and Pap tests.  Colorectal Cancer  This type of cancer can be detected and often prevented.  Routine colorectal cancer screening usually begins at 71 years of age and continues through 71 years of age.  Your health care provider may recommend screening at an earlier age if  you have risk factors for colon cancer.  Your health care provider may also recommend using home test kits to check for hidden blood in the stool.  A small camera at the end of a tube can be used to examine your colon directly (sigmoidoscopy or colonoscopy). This is done to check for the earliest forms of colorectal cancer.  Routine screening usually begins at age 27.  Direct examination of the colon should be repeated every 5-10 years through 71 years of age. However, you may need to be screened more often if early forms of precancerous polyps or small growths are found.  Skin Cancer  Check your skin from head to toe regularly.  Tell your health care provider about any new moles or changes in moles, especially if there is a change in a mole's shape or color.  Also tell your health care provider if you have a mole that is larger than the size of a pencil eraser.  Always use  sunscreen. Apply sunscreen liberally and repeatedly throughout the day.  Protect yourself by wearing long sleeves, pants, a wide-brimmed hat, and sunglasses whenever you are outside.  Heart disease, diabetes, and high blood pressure  High blood pressure causes heart disease and increases the risk of stroke. High blood pressure is more likely to develop in: ? People who have blood pressure in the high end of the normal range (130-139/85-89 mm Hg). ? People who are overweight or obese. ? People who are African American.  If you are 68-26 years of age, have your blood pressure checked every 3-5 years. If you are 35 years of age or older, have your blood pressure checked every year. You should have your blood pressure measured twice-once when you are at a hospital or clinic, and once when you are not at a hospital or clinic. Record the average of the two measurements. To check your blood pressure when you are not at a hospital or clinic, you can use: ? An automated blood pressure machine at a pharmacy. ? A home blood  pressure monitor.  If you are between 60 years and 91 years old, ask your health care provider if you should take aspirin to prevent strokes.  Have regular diabetes screenings. This involves taking a blood sample to check your fasting blood sugar level. ? If you are at a normal weight and have a low risk for diabetes, have this test once every three years after 71 years of age. ? If you are overweight and have a high risk for diabetes, consider being tested at a younger age or more often. Preventing infection Hepatitis B  If you have a higher risk for hepatitis B, you should be screened for this virus. You are considered at high risk for hepatitis B if: ? You were born in a country where hepatitis B is common. Ask your health care provider which countries are considered high risk. ? Your parents were born in a high-risk country, and you have not been immunized against hepatitis B (hepatitis B vaccine). ? You have HIV or AIDS. ? You use needles to inject street drugs. ? You live with someone who has hepatitis B. ? You have had sex with someone who has hepatitis B. ? You get hemodialysis treatment. ? You take certain medicines for conditions, including cancer, organ transplantation, and autoimmune conditions.  Hepatitis C  Blood testing is recommended for: ? Everyone born from [birth year] through 1964. ? Anyone with known risk factors for hepatitis C.  Sexually transmitted infections (STIs)  You should be screened for sexually transmitted infections (STIs) including gonorrhea and chlamydia if: ? You are sexually active and are younger than 71 years of age. ? You are older than 71 years of age and your health care provider tells you that you are at risk for this type of infection. ? Your sexual activity has changed since you were last screened and you are at an increased risk for chlamydia or gonorrhea. Ask your health care provider if you are at risk.  If you do not have HIV, but are at risk,  it may be recommended that you take a prescription medicine daily to prevent HIV infection. This is called pre-exposure prophylaxis (PrEP). You are considered at risk if: ? You are sexually active and do not regularly use condoms or know the HIV status of your partner(s). ? You take drugs by injection. ? You are sexually active with a partner who has HIV.  Talk with your health  care provider about whether you are at high risk of being infected with HIV. If you choose to begin PrEP, you should first be tested for HIV. You should then be tested every 3 months for as long as you are taking PrEP. Pregnancy  If you are premenopausal and you may become pregnant, ask your health care provider about preconception counseling.  If you may become pregnant, take 400 to 800 micrograms (mcg) of folic acid every day.  If you want to prevent pregnancy, talk to your health care provider about birth control (contraception). Osteoporosis and menopause  Osteoporosis is a disease in which the bones lose minerals and strength with aging. This can result in serious bone fractures. Your risk for osteoporosis can be identified using a bone density scan.  If you are 50 years of age or older, or if you are at risk for osteoporosis and fractures, ask your health care provider if you should be screened.  Ask your health care provider whether you should take a calcium or vitamin D supplement to lower your risk for osteoporosis.  Menopause may have certain physical symptoms and risks.  Hormone replacement therapy may reduce some of these symptoms and risks. Talk to your health care provider about whether hormone replacement therapy is right for you. Follow these instructions at home:  Schedule regular health, dental, and eye exams.  Stay current with your immunizations.  Do not use any tobacco products including cigarettes, chewing tobacco, or electronic cigarettes.  If you are pregnant, do not drink  alcohol.  If you are breastfeeding, limit how much and how often you drink alcohol.  Limit alcohol intake to no more than 1 drink per day for nonpregnant women. One drink equals 12 ounces of beer, 5 ounces of wine, or 1 ounces of hard liquor.  Do not use street drugs.  Do not share needles.  Ask your health care provider for help if you need support or information about quitting drugs.  Tell your health care provider if you often feel depressed.  Tell your health care provider if you have ever been abused or do not feel safe at home. This information is not intended to replace advice given to you by your health care provider. Make sure you discuss any questions you have with your health care provider. Document Released: 04/05/2011 Document Revised: 02/26/2016 Document Reviewed: 06/24/2015 [employer] Interactive Patient Education  [employer].

## 2018-05-26 NOTE — Progress Notes (Signed)
Patient ID: [name] [name], female  DOB: 21-Apr-1946, 72 y.o.   MRN: [id number] Patient Care Team    Relationship Specialty Notifications Start End  Ma Hillock, DO PCP - General Family Medicine  06/02/15   Gatha Mayer, MD Consulting Physician Gastroenterology  11/24/15   Calvert Cantor, MD Consulting Physician Ophthalmology  11/24/15   Specialists, [facility]  Orthopedic Surgery  04/12/16   Pa, [facility]    01/16/18   Rheumatology, [facility]    01/16/18     Chief Complaint  Patient presents with  . Annual Exam    Subjective:  [name] [name] is a 72 y.o.  Female  present for CPE. All past medical history, surgical history, allergies, family history, immunizations, medications and social history were updated in the electronic medical record today. All recent labs, ED visits and hospitalizations within the last year were reviewed.  Hoarseness: pt is still concerned over her hoarseness (see prior note). She was referred to ENT which did not feel she had signs of GERD or allergies. They also, performed larygoscope without concerning findings. She denies cough. Hoarseness has been present > 5 years in a former smoker.   Health maintenance:  Colonoscopy: pt declined.  Mammogram: completed:08/2017, birads 1. Breast center--> ordered today to schedule.  Cervical cancer screening: N/A hysterectomy Immunizations: tdap UTD 2011, Influenza UTD- nurse visit for HD when avialable (encouraged yearly), PNA series completed, zostavax completed, shingrix re-printed today Infectious disease screening: Hep C completed DEXA: last completed 07/26/2016, result osteroporosis, follow needed 2-3 years, on reclast Assistive device: none Oxygen CWC:BJSE Patient has a Dental home. Hospitalizations/ED visits: reviewed   Depression screen [facility] 2/9 04/19/2018 01/16/2018 10/19/2017 05/25/2017 04/13/2017  Decreased Interest 0 3 2 0 2  Down, Depressed, Hopeless 0 1 1 0 1  PHQ - 2  Score 0 4 3 0 3  Altered sleeping _0 Tired, decreased energy _1 Change in appetite 0 2 1 0 0  Feeling bad or failure about yourself  0 0 0 0 0  Trouble concentrating 0 0 0 0 1  Moving slowly or fidgety/restless 0 0 0 0 0  Suicidal thoughts 0 0 0 0 0  PHQ-9 Score _2 Difficult doing work/chores - Somewhat difficult Somewhat difficult - -   GAD 7 : Generalized Anxiety Score 04/19/2018 10/19/2017 04/13/2017  Nervous, Anxious, on Edge _3 Control/stop worrying _4 Worry too much - different things 1 3 0  Trouble relaxing _5 Restless 1 0 0  Easily annoyed or irritable 0 0 0  Afraid - awful might happen 1 1 0  Total GAD 7 Score _6 Anxiety Difficulty - Somewhat difficult -     Immunization History  Administered Date(s) Administered  . Influenza Split 07/02/2011  . Influenza Whole 07/16/2008, 07/04/2009, 07/14/2010  . Influenza,inj,Quad PF,6+ Mos 06/29/2013, 07/08/2014  . Influenza-Unspecified 07/02/2015  . Pneumococcal Conjugate-13 05/15/2014  . Pneumococcal Polysaccharide-23 10/04/2006, 09/11/2015  . Td 03/19/2010  . Zoster 07/25/2015     Past Medical History:  Diagnosis Date  . Anxiety   . Arthritis    oa  . Chronic cystitis   . Depression   . Fibromyalgia   . GERD (gastroesophageal reflux disease)   . Headache    sinus  . Heart murmur   . History of duodenal ulcer  2007  . History of gastritis    2007  . Hyperlipidemia   . Internal and external hemorrhoids without complication   . Intrinsic (urethral) sphincter deficiency (ISD)   . Irritable bowel syndrome   . Osteopenia   . Pernicious anemia   . PONV (postoperative nausea and vomiting)   . Unspecified essential hypertension   . Unspecified venous (peripheral) insufficiency    wears compression hose  . Unspecified vitamin D deficiency   . Urine incontinence    Allergies  Allergen Reactions  . Morphine Nausea And Vomiting    SEVERE  . Sulfa Antibiotics Swelling    Past Surgical History:  Procedure Laterality Date  . ANTERIOR AND POSTERIOR VAGINAL REPAIR  04-29-2004   AND TRANSVAGINAL TAPE SLING  . ANTERIOR CERVICAL DECOMP/DISCECTOMY FUSION  1999  . APPENDECTOMY  1984  . BILATERAL SALPINGOOPHORECTOMY  1984  . CARDIAC CATHETERIZATION  10-03-2002   DR DEGENT   NORMAL CORONARIES ARTERIES/  EF 55%  . CYSTOSCOPY N/A 08/27/2013   Procedure: CYSTOSCOPY;  Surgeon: Ailene Rud, MD;  Location: [facility];  Service: Urology;  Laterality: N/A;  . CYSTOSCOPY WITH INJECTION N/A 12/17/2013   Procedure: Ascencion Dike WITH INJECTION;  Surgeon: Ailene Rud, MD;  Location: [facility];  Service: Urology;  Laterality: N/A;  . DILATION AND CURETTAGE OF UTERUS    . EXCISION RIGHT NECK AND LEFT BREAST SEBACEOUS CYST  05-18-2002  . HEMORRHOID BANDING  2014  . PUBOVAGINAL SLING N/A 10/25/2016   Procedure: Gaynelle Arabian;  Surgeon: Carolan Clines, MD;  Location: [facility];  Service: Urology;  Laterality: N/A;  . right litlle finger surgery  yrs ago   cyst removed x 4  . TOTAL ABDOMINAL HYSTERECTOMY  1986  . TUBAL LIGATION    . VAGINAL PROLAPSE REPAIR N/A 08/27/2013   Procedure: ANTERIOR VAGINAL VAULT SUSPENSION, KELLY PLICATION WITH SACROSPINOUS LIGAMENT FIXATION AND XENFORM BOVINE DERMIS GRAFT AUGMENTATION, URETHRAL EXPLORATION, URETHROLYSIS, EXPLANTATION OF TVT TAPE, IMPLANTATION OF FLOSEAL, IMPLANTATION OF XENFORM BOVINE GRAFT IN PERIURETHRAL SPACE;  Surgeon: Ailene Rud, MD;  Location: [facility];  Service: Urology;  Laterality: N/A  . VAGINAL PROLAPSE REPAIR N/A 10/25/2016   Procedure: VAGINAL VAULT SUSPENSION with mesh and sacrospinous repair;  Surgeon: Carolan Clines, MD;  Location: [facility];  Service: Urology;  Laterality: N/A;   Family History  Problem Relation Age of Onset  . Lung cancer Father   . Coronary artery disease Father   . Hypertension Mother   . Heart disease Mother    . Myelodysplastic syndrome Mother   . Arthritis Mother   . Dementia Maternal Aunt   . Fibromyalgia Sister   . Hypertension Sister   . Anemia Sister   . Depression Sister   . Hypertension Brother   . Anemia Brother   . Hypertension Brother   . Hyperlipidemia Brother   . Colon polyps Unknown        aunt  . Heart disease Maternal Uncle   . Lung cancer Paternal Uncle   . Kidney disease Sister        kidney stones, removed kidney  . Dementia Maternal Aunt   . Lung cancer Maternal Uncle   . Cancer Paternal Uncle   . Colon cancer Neg Hx    Social History   Socioeconomic History  . Marital status: Divorced    Spouse name: Not on file  . Number of children: 1  . Years of education: Not on file  . Highest  education level: Not on file  Occupational History  . Occupation: Retired  Scientific laboratory technician  . Financial resource strain: Not on file  . Food insecurity:    Worry: Not on file    Inability: Not on file  . Transportation needs:    Medical: Not on file    Non-medical: Not on file  Tobacco Use  . Smoking status: Former Smoker    Packs/day: 1.00    Years: 15.00    Pack years: 15.00    Types: Cigarettes    Start date: 12/06/1993    Last attempt to quit: 08/09/2009    Years since quitting: 8.8  . Smokeless tobacco: Never Used  . Tobacco comment: quit smoking 5 years ago  Substance and Sexual Activity  . Alcohol use: Yes    Comment: ocassional  . Drug use: No  . Sexual activity: Never  Lifestyle  . Physical activity:    Days per week: Not on file    Minutes per session: Not on file  . Stress: Not on file  Relationships  . Social connections:    Talks on phone: Not on file    Gets together: Not on file    Attends religious service: Not on file    Active member of club or organization: Not on file    Attends meetings of clubs or organizations: Not on file    Relationship status: Not on file  . Intimate partner violence:    Fear of current or ex partner: Not on file     Emotionally abused: Not on file    Physically abused: Not on file    Forced sexual activity: Not on file  Other Topics Concern  . Not on file  Social History Narrative  . Not on file   Allergies as of 05/26/2018      Reactions   Morphine Nausea And Vomiting   SEVERE   Sulfa Antibiotics Swelling      Medication List        Accurate as of 05/26/18 11:59 PM. Always use your most recent med list.          acetaminophen 500 MG tablet Commonly known as:  TYLENOL Take 1,000 mg by mouth every 6 (six) hours as needed for headache.   atenolol 25 MG tablet Commonly known as:  TENORMIN Take 1 tablet (25 mg total) by mouth every morning.   calcium carbonate 600 MG Tabs tablet Commonly known as:  OS-CAL Take 600 mg by mouth daily.   cetirizine 10 MG tablet Commonly known as:  ZYRTEC Take 10 mg by mouth daily as needed for allergies.   cholecalciferol 1000 units tablet Commonly known as:  VITAMIN D Take 2 tablets (2,000 Units total) by mouth daily.   dicyclomine 10 MG capsule Commonly known as:  BENTYL Take 1 capsule (10 mg total) by mouth 4 (four) times daily -  before meals and at bedtime.   DULoxetine 60 MG capsule Commonly known as:  CYMBALTA Take 1 capsule (60 mg total) by mouth 2 (two) times daily.   ESTRACE VAGINAL 0.1 MG/GM vaginal cream Generic drug:  estradiol Estrace 0.01% (0.1 mg/gram) vaginal cream  1 gm q hs x14 then 1/2 gm 2x/wk   FISH OIL PO Take 1 capsule by mouth daily.   fluticasone 50 MCG/ACT nasal spray Commonly known as:  FLONASE Place into both nostrils as needed.   folic acid 421 MCG tablet Commonly known as:  FOLVITE Take 800 mcg by mouth daily.  Influenza vac split quadrivalent PF 0.5 ML injection Commonly known as:  FLUZONE HIGH-DOSE Fluzone High-Dose 2016-2017 (PF) 180 mcg/0.5 mL intramuscular syringe  ADM 0.5ML IM UTD   LORazepam 0.5 MG tablet Commonly known as:  ATIVAN Take 1 tablet (0.5 mg total) by mouth every 12 (twelve) hours  as needed for up to 1 dose for anxiety (for panic attack).   losartan-hydrochlorothiazide 50-12.5 MG tablet Commonly known as:  HYZAAR Take 1 tablet by mouth daily.   omeprazole 20 MG capsule Commonly known as:  PRILOSEC TAKE 1 CAPSULE EVERY MORNING  30  MINUTES BEFORE A MEAL   oxybutynin 10 MG 24 hr tablet Commonly known as:  DITROPAN-XL Take 10 mg by mouth daily.   phenazopyridine 200 MG tablet Commonly known as:  PYRIDIUM phenazopyridine 200 mg tablet  TAKE 1 TABLET BY MOUTH 3 TIMES A DAY FOR 2 DAYS FOR PAIN/BURNING DURING URINATION. TAKE AFTER MEALS   pravastatin 40 MG tablet Commonly known as:  PRAVACHOL Take 1 tablet (40 mg total) by mouth daily.   RECLAST IV Inject into the vein.   RESTASIS 0.05 % ophthalmic emulsion Generic drug:  cycloSPORINE Restasis 0.05 % eye drops in a dropperette  INSTILL 1 DROP IN BOTH EYES TWICE A DAY   sertraline 100 MG tablet Commonly known as:  ZOLOFT Take 2 tablets (200 mg total) by mouth daily.   traMADol 50 MG tablet Commonly known as:  ULTRAM Take 1 tablet (50 mg total) by mouth 2 (two) times daily as needed for moderate pain. TAKES 1 AT BEDTIME AND 1 DURING THE DAY ONLY IF NEEDED   traZODone 100 MG tablet Commonly known as:  DESYREL Take 1 tablet (100 mg total) by mouth at bedtime.   Turmeric Curcumin 500 MG Caps Take 1,000 mg by mouth daily.   Vitamin B-12 1000 MCG Subl Place 1 tablet under the tongue daily.   WOMENS MULTIVITAMIN PLUS PO Take 1 tablet by mouth daily.   ZOSTAVAX 05397 UNT/0.65ML injection Generic drug:  Zoster Vaccine Live (PF) Zostavax (PF) 19,400 unit/0.65 mL subcutaneous suspension   Zoster Vaccine Adjuvanted injection Commonly known as:  SHINGRIX Inject 0.5 mLs into the muscle once for 1 dose. Rpt dose in 2-6 months once       All past medical history, surgical history, allergies, family history, immunizations andmedications were updated in the EMR today and reviewed under the history and  medication portions of their EMR.     Recent Results (from the past 2160 hour(s))  TSH     Status: Abnormal   Collection Time: 04/19/18  8:41 AM  Result Value Ref Range   TSH 5.86 (H) 0.35 - 4.50 uIU/mL  Comp Met (CMET)     Status: Abnormal   Collection Time: 04/19/18  8:41 AM  Result Value Ref Range   Sodium 142 135 - 145 mEq/L   Potassium 3.9 3.5 - 5.1 mEq/L   Chloride 104 96 - 112 mEq/L   CO2 31 19 - 32 mEq/L   Glucose, Bld 91 70 - 99 mg/dL   BUN 15 6 - 23 mg/dL   Creatinine, Ser 1.08 0.40 - 1.20 mg/dL   Total Bilirubin 0.5 0.2 - 1.2 mg/dL   Alkaline Phosphatase 50 39 - 117 U/L   AST 12 0 - 37 U/L   ALT 11 0 - 35 U/L   Total Protein 6.7 6.0 - 8.3 g/dL   Albumin 4.2 3.5 - 5.2 g/dL   Calcium 9.5 8.4 - 10.5 mg/dL  GFR 53.14 (L) >60.00 mL/min  TSH     Status: None   Collection Time: 05/26/18  8:49 AM  Result Value Ref Range   TSH 2.42 0.35 - 4.50 uIU/mL  T4, free     Status: None   Collection Time: 05/26/18  8:49 AM  Result Value Ref Range   Free T4 0.64 0.60 - 1.60 ng/dL    Comment: Specimens from patients who are undergoing biotin therapy and /or ingesting biotin supplements may contain high levels of biotin.  The higher biotin concentration in these specimens interferes with this Free T4 assay.  Specimens that contain high levels  of biotin may cause false high results for this Free T4 assay.  Please interpret results in light of the total clinical presentation of the patient.    Vitamin D (25 hydroxy)     Status: None   Collection Time: 05/26/18  8:49 AM  Result Value Ref Range   VITD 42.62 30.00 - 100.00 ng/mL  CBC w/Diff     Status: None   Collection Time: 05/26/18  8:49 AM  Result Value Ref Range   WBC 8.7 4.0 - 10.5 K/uL   RBC 4.24 3.87 - 5.11 Mil/uL   Hemoglobin 12.0 12.0 - 15.0 g/dL   HCT 37.3 36.0 - 46.0 %   MCV 87.9 78.0 - 100.0 fl   MCHC 32.3 30.0 - 36.0 g/dL   RDW 13.8 11.5 - 15.5 %   Platelets 282.0 150.0 - 400.0 K/uL   Neutrophils Relative % 62.8  43.0 - 77.0 %   Lymphocytes Relative 25.8 12.0 - 46.0 %   Monocytes Relative 7.5 3.0 - 12.0 %   Eosinophils Relative 2.9 0.0 - 5.0 %   Basophils Relative 1.0 0.0 - 3.0 %   Neutro Abs 5.4 1.4 - 7.7 K/uL   Lymphs Abs 2.2 0.7 - 4.0 K/uL   Monocytes Absolute 0.7 0.1 - 1.0 K/uL   Eosinophils Absolute 0.3 0.0 - 0.7 K/uL   Basophils Absolute 0.1 0.0 - 0.1 K/uL  Lipid panel     Status: Abnormal   Collection Time: 05/26/18  8:49 AM  Result Value Ref Range   Cholesterol 169 0 - 200 mg/dL    Comment: ATP III Classification       Desirable:  < 200 mg/dL               Borderline High:  200 - 239 mg/dL          High:  > = 240 mg/dL   Triglycerides 186.0 (H) 0.0 - 149.0 mg/dL    Comment: Normal:  <150 mg/dLBorderline High:  150 - 199 mg/dL   HDL 44.90 >39.00 mg/dL   VLDL 37.2 0.0 - 40.0 mg/dL   LDL Cholesterol 87 0 - 99 mg/dL   Total CHOL/HDL Ratio 4     Comment:                Men          Women1/2 Average Risk     3.4          3.3Average Risk          5.0          4.42X Average Risk          9.6          7.13X Average Risk          15.0          11.0  NonHDL 124.36     Comment: NOTE:  Non-HDL goal should be 30 mg/dL higher than patient's LDL goal (i.e. LDL goal of < 70 mg/dL, would have non-HDL goal of < 100 mg/dL)  HgB A1c     Status: None   Collection Time: 05/26/18  8:49 AM  Result Value Ref Range   Hgb A1c MFr Bld 5.5 4.6 - 6.5 %    Comment: Glycemic Control Guidelines for People with Diabetes:Non Diabetic:  <6%Goal of Therapy: <7%Additional Action Suggested:  >8%     US Breast Ltd Uni Left Inc Axilla  Result Date: 08/22/2017 RECOMMENDATION: Screening mammogram in one year.(Code:SM-B-01Y) I have discussed the findings and recommendations with the patient. Results were also provided in writing at the conclusion of the visit. If applicable, a reminder letter will be sent to the patient regarding the next appointment. BI-RADS CATEGORY  2: Benign. Electronically Signed   By:  Fidela Salisbury M.D.   On: 08/22/2017 10:34     ROS: 14 pt review of systems performed and negative (unless mentioned in an HPI)  Objective: BP 121/76 (BP Location: Left Arm, Patient Position: Sitting, Cuff Size: Normal)   Pulse 72   Temp 98.1 F (36.7 C) (Oral)   Resp 16   Ht 5' 6" (1.676 m)   Wt 196 lb (88.9 kg)   SpO2 96%   BMI 31.64 kg/m  Gen: Afebrile. No acute distress. Nontoxic in appearance, well-developed, well-nourished,  Obese, pleasant caucasian female.  HENT: AT. [place]. Bilateral TM visualized and normal in appearance, normal external auditory canal. MMM, no oral lesions, adequate dentition. Bilateral nares within normal limits. Throat without erythema, ulcerations or exudates. no Cough on exam,  Hoarseness present on exam. Eyes:Pupils Equal Round Reactive to light, Extraocular movements intact,  Conjunctiva without redness, discharge or icterus. Neck/lymp/endocrine: Supple,no lymphadenopathy, no thyromegaly CV: RRR no murmur, no edema, +2/4 P posterior tibialis pulses. no carotid bruits. No JVD. Chest: CTAB, no wheeze, rhonchi or crackles. normal Respiratory effort. good Air movement. Abd: Soft. obese. NTND. BS present. no Masses palpated. No hepatosplenomegaly. No rebound tenderness or guarding. Skin: no rashes, purpura or petechiae. Warm and well-perfused. Skin intact. Neuro/Msk:  Normal gait. PERLA. EOMi. Alert. Oriented x3.  Cranial nerves II through XII intact. Muscle strength 5/5 upper/lower extremity. DTRs equal bilaterally. Psych: Normal affect, dress and demeanor. Normal speech. Normal thought content and judgment.   No exam data present  Assessment/plan: ROBBIE NANGLE is a 72 y.o. female present for CPE. Essential hypertension/CKD3/HLD/morbid obesity - stable. Changed lisinopril to losartan to rule out potential cause of hoarseness, although she has not cough. Worth a try.  - monitor BP next few weeks to ensure good control on new med.  - CBC w/Diff -  Lipid panel - If losartan does not improve her hoarseness, she will follow up in 4-8 weeks to discuss. Consider GI referral vs. Imaging.  Osteoporosis without current pathological fracture, unspecified osteoporosis type - receiving reclast. Rpt dexa 2020 - Vitamin D (25 hydroxy) Palpitations/eleavted tsh - improved from last visit. Now infrequent.  - TSH - T4, free Hypertriglyceridemia - Lipid panel Vitamin D deficiency - Vitamin D (25 hydroxy) Elevated glucose - HgB A1c Breast cancer screening - MM 3D SCREEN BREAST BILATERAL; Future Hoarseness - ENT referral did not show cause or provide further recs to pt.  - switched lisinopril to losartan this visit.  - if still present in 4-8 weeks, follow up and will consider further eval. GI referral/imaging etc.  Encounter  for general adult medical examination with abnormal findings Patient was encouraged to exercise greater than 150 minutes a week. Patient was encouraged to choose a diet filled with fresh fruits and vegetables, and lean meats. AVS provided to patient today for education/recommendation on gender specific health and safety maintenance. Colonoscopy: pt declined.  Mammogram: completed:08/2017, birads 1. Breast center--> ordered today to schedule.  Cervical cancer screening: N/A hysterectomy Immunizations: tdap UTD 2011, Influenza UTD- nurse visit for HD when avialable (encouraged yearly), PNA series completed, zostavax completed, shingrix re-printed today Infectious disease screening: Hep C completed  Return in about 1 year (around 05/27/2019) for CPE. Every 6 months for [facility].   Electronically signed by: Howard Pouch, DO [place]

## 2018-05-29 ENCOUNTER — Encounter: Payer: Self-pay | Admitting: Family Medicine

## 2018-06-20 DIAGNOSIS — N3020 Other chronic cystitis without hematuria: Secondary | ICD-10-CM | POA: Diagnosis not present

## 2018-06-20 DIAGNOSIS — N8111 Cystocele, midline: Secondary | ICD-10-CM | POA: Diagnosis not present

## 2018-06-20 DIAGNOSIS — N3946 Mixed incontinence: Secondary | ICD-10-CM | POA: Diagnosis not present

## 2018-06-27 DIAGNOSIS — M797 Fibromyalgia: Secondary | ICD-10-CM | POA: Diagnosis not present

## 2018-06-27 DIAGNOSIS — Z6841 Body Mass Index (BMI) 40.0 and over, adult: Secondary | ICD-10-CM | POA: Diagnosis not present

## 2018-06-27 DIAGNOSIS — M545 Low back pain: Secondary | ICD-10-CM | POA: Diagnosis not present

## 2018-06-27 DIAGNOSIS — M150 Primary generalized (osteo)arthritis: Secondary | ICD-10-CM | POA: Diagnosis not present

## 2018-06-27 DIAGNOSIS — M810 Age-related osteoporosis without current pathological fracture: Secondary | ICD-10-CM | POA: Diagnosis not present

## 2018-06-27 DIAGNOSIS — M2550 Pain in unspecified joint: Secondary | ICD-10-CM | POA: Diagnosis not present

## 2018-07-04 ENCOUNTER — Ambulatory Visit (INDEPENDENT_AMBULATORY_CARE_PROVIDER_SITE_OTHER): Payer: Medicare HMO | Admitting: *Deleted

## 2018-07-04 ENCOUNTER — Telehealth: Payer: Self-pay | Admitting: Family Medicine

## 2018-07-04 DIAGNOSIS — Z23 Encounter for immunization: Secondary | ICD-10-CM | POA: Diagnosis not present

## 2018-07-04 NOTE — Telephone Encounter (Signed)
Patient is asking if Dr. Raoul Pitch thinks she should go to have allergy testing. She is still experiencing hoarseness.

## 2018-07-04 NOTE — Telephone Encounter (Signed)
Spoke with patient scheduled a follow up appointment for hoarseness as directed from last office note.

## 2018-07-05 DIAGNOSIS — M545 Low back pain: Secondary | ICD-10-CM | POA: Diagnosis not present

## 2018-07-17 ENCOUNTER — Ambulatory Visit: Payer: Medicare HMO | Admitting: Family Medicine

## 2018-07-24 ENCOUNTER — Ambulatory Visit: Payer: Medicare HMO | Admitting: Family Medicine

## 2018-07-25 DIAGNOSIS — M545 Low back pain: Secondary | ICD-10-CM | POA: Diagnosis not present

## 2018-07-27 DIAGNOSIS — N3946 Mixed incontinence: Secondary | ICD-10-CM | POA: Diagnosis not present

## 2018-07-27 DIAGNOSIS — N8111 Cystocele, midline: Secondary | ICD-10-CM | POA: Diagnosis not present

## 2018-07-27 DIAGNOSIS — N3020 Other chronic cystitis without hematuria: Secondary | ICD-10-CM | POA: Diagnosis not present

## 2018-07-28 ENCOUNTER — Ambulatory Visit (INDEPENDENT_AMBULATORY_CARE_PROVIDER_SITE_OTHER): Payer: Medicare HMO | Admitting: Family Medicine

## 2018-07-28 ENCOUNTER — Encounter: Payer: Self-pay | Admitting: Family Medicine

## 2018-07-28 VITALS — BP 121/82 | HR 69 | Temp 98.1°F | Resp 20 | Ht 65.0 in | Wt 195.0 lb

## 2018-07-28 DIAGNOSIS — R490 Dysphonia: Secondary | ICD-10-CM

## 2018-07-28 DIAGNOSIS — R1310 Dysphagia, unspecified: Secondary | ICD-10-CM | POA: Insufficient documentation

## 2018-07-28 DIAGNOSIS — I7410 Embolism and thrombosis of unspecified parts of aorta: Secondary | ICD-10-CM

## 2018-07-28 DIAGNOSIS — K219 Gastro-esophageal reflux disease without esophagitis: Secondary | ICD-10-CM | POA: Diagnosis not present

## 2018-07-28 DIAGNOSIS — N182 Chronic kidney disease, stage 2 (mild): Secondary | ICD-10-CM

## 2018-07-28 DIAGNOSIS — R4789 Other speech disturbances: Secondary | ICD-10-CM | POA: Diagnosis not present

## 2018-07-28 DIAGNOSIS — R221 Localized swelling, mass and lump, neck: Secondary | ICD-10-CM

## 2018-07-28 HISTORY — DX: Dysphagia, unspecified: R13.10

## 2018-07-28 MED ORDER — PREDNISONE 50 MG PO TABS: 50.0000 mg | ORAL_TABLET | Freq: Every day | ORAL | 0 refills | Status: DC

## 2018-07-28 MED ORDER — FLUCONAZOLE 100 MG PO TABS: ORAL_TABLET | ORAL | 0 refills | Status: DC

## 2018-07-28 NOTE — Progress Notes (Signed)
[name] [name] , Mar 25, 1946, 72 y.o., female MRN: [id number] Patient Care Team    Relationship Specialty Notifications Start End  Ma Hillock, DO PCP - General Family Medicine  06/02/15   Gatha Mayer, MD Consulting Physician Gastroenterology  11/24/15   Calvert Cantor, MD Consulting Physician Ophthalmology  11/24/15   Specialists, [facility]  Orthopedic Surgery  04/12/16   Pa, [facility]    01/16/18   Rheumatology, [facility]    01/16/18     Chief Complaint  Patient presents with  . Hoarse     Subjective:  Hoarseness: Pt returns today to discuss her hoarseness again. She has been on a PPI for months, she has been swtiched from ACE to ARB, She has been on flonase and zyrtec daily.She has been seen by ENT, which did not feel she had signs of GERD or allergies. They also, performed larygoscope without concerning findings. She denies cough. Hoarseness has been present > 5 years in a former smoker. She reports it has been actually worsening and people have had a hard time hearing her over the phone and in [place] thru. Her quality of life has been negatively affected.  Depression screen [facility] 2/9 04/19/2018 01/16/2018 10/19/2017 05/25/2017 04/13/2017  Decreased Interest 0 3 2 0 2  Down, Depressed, Hopeless 0 1 1 0 1  PHQ - 2 Score 0 4 3 0 3  Altered sleeping 1 3 2 1 3   Tired, decreased energy 1 3 2 2 2   Change in appetite 0 2 1 0 0  Feeling bad or failure about yourself  0 0 0 0 0  Trouble concentrating 0 0 0 0 1  Moving slowly or fidgety/restless 0 0 0 0 0  Suicidal thoughts 0 0 0 0 0  PHQ-9 Score 2 12 8 3 9   Difficult doing work/chores - Somewhat difficult Somewhat difficult - -    Allergies  Allergen Reactions  . Morphine Nausea And Vomiting    SEVERE  . Sulfa Antibiotics Swelling   Social History   Tobacco Use  . Smoking status: Former Smoker    Packs/day: 1.00    Years: 15.00    Pack years: 15.00    Types: Cigarettes    Start date: 12/06/1993     Last attempt to quit: 08/09/2009    Years since quitting: 8.9  . Smokeless tobacco: Never Used  . Tobacco comment: quit smoking 5 years ago  Substance Use Topics  . Alcohol use: Yes    Comment: ocassional   Past Medical History:  Diagnosis Date  . Anxiety   . Arthritis    oa  . Chronic cystitis   . Depression   . Fibromyalgia   . GERD (gastroesophageal reflux disease)   . Headache    sinus  . Heart murmur   . History of duodenal ulcer    2007  . History of gastritis    2007  . Hyperlipidemia   . Internal and external hemorrhoids without complication   . Intrinsic (urethral) sphincter deficiency (ISD)   . Irritable bowel syndrome   . Osteopenia   . Pernicious anemia   . PONV (postoperative nausea and vomiting)   . Unspecified essential hypertension   . Unspecified venous (peripheral) insufficiency    wears compression hose  . Unspecified vitamin D deficiency   . Urine incontinence    Past Surgical History:  Procedure Laterality Date  . ANTERIOR AND POSTERIOR VAGINAL REPAIR  04-29-2004   AND TRANSVAGINAL  TAPE SLING  . ANTERIOR CERVICAL DECOMP/DISCECTOMY FUSION  1999  . APPENDECTOMY  1984  . BILATERAL SALPINGOOPHORECTOMY  1984  . CARDIAC CATHETERIZATION  10-03-2002   DR DEGENT   NORMAL CORONARIES ARTERIES/  EF 55%  . CYSTOSCOPY N/A 08/27/2013   Procedure: CYSTOSCOPY;  Surgeon: Ailene Rud, MD;  [facility];  Service: Urology;  Laterality: N/A;  . CYSTOSCOPY WITH INJECTION N/A 12/17/2013   Procedure: Ascencion Dike WITH INJECTION;  Surgeon: Ailene Rud, MD;  Location: [facility];  Service: Urology;  Laterality: N/A;  . DILATION AND CURETTAGE OF UTERUS    . EXCISION RIGHT NECK AND LEFT BREAST SEBACEOUS CYST  05-18-2002  . HEMORRHOID BANDING  2014  . PUBOVAGINAL SLING N/A 10/25/2016   Procedure: Gaynelle Arabian;  Surgeon: Carolan Clines, MD;  Location: [facility];  Service: Urology;  Laterality: N/A;  .  right litlle finger surgery  yrs ago   cyst removed x 4  . TOTAL ABDOMINAL HYSTERECTOMY  1986  . TUBAL LIGATION    . VAGINAL PROLAPSE REPAIR N/A 08/27/2013   Procedure: ANTERIOR VAGINAL VAULT SUSPENSION, KELLY PLICATION WITH SACROSPINOUS LIGAMENT FIXATION AND XENFORM BOVINE DERMIS GRAFT AUGMENTATION, URETHRAL EXPLORATION, URETHROLYSIS, EXPLANTATION OF TVT TAPE, IMPLANTATION OF FLOSEAL, IMPLANTATION OF XENFORM BOVINE GRAFT IN PERIURETHRAL SPACE;  Surgeon: Ailene Rud, MD;  Location: [facility];  Service: Urology;  Laterality: N/A  . VAGINAL PROLAPSE REPAIR N/A 10/25/2016   Procedure: VAGINAL VAULT SUSPENSION with mesh and sacrospinous repair;  Surgeon: Carolan Clines, MD;  Location: [facility];  Service: Urology;  Laterality: N/A;   Family History  Problem Relation Age of Onset  . Lung cancer Father   . Coronary artery disease Father   . Hypertension Mother   . Heart disease Mother   . Myelodysplastic syndrome Mother   . Arthritis Mother   . Dementia Maternal Aunt   . Fibromyalgia Sister   . Hypertension Sister   . Anemia Sister   . Depression Sister   . Hypertension Brother   . Anemia Brother   . Hypertension Brother   . Hyperlipidemia Brother   . Colon polyps Unknown        aunt  . Heart disease Maternal Uncle   . Lung cancer Paternal Uncle   . Kidney disease Sister        kidney stones, removed kidney  . Dementia Maternal Aunt   . Lung cancer Maternal Uncle   . Cancer Paternal Uncle   . Colon cancer Neg Hx    Allergies as of 07/28/2018      Reactions   Morphine Nausea And Vomiting   SEVERE   Sulfa Antibiotics Swelling      Medication List        Accurate as of 07/28/18  2:28 PM. Always use your most recent med list.          acetaminophen 500 MG tablet Commonly known as:  TYLENOL Take 1,000 mg by mouth every 6 (six) hours as needed for headache.   atenolol 25 MG tablet Commonly known as:  TENORMIN Take 1 tablet (25 mg total) by mouth  every morning.   calcium carbonate 600 MG Tabs tablet Commonly known as:  OS-CAL Take 600 mg by mouth daily.   cetirizine 10 MG tablet Commonly known as:  ZYRTEC Take 10 mg by mouth daily as needed for allergies.   cholecalciferol 1000 units tablet Commonly known as:  VITAMIN D Take 2 tablets (2,000 Units total) by  mouth daily.   dicyclomine 10 MG capsule Commonly known as:  BENTYL Take 1 capsule (10 mg total) by mouth 4 (four) times daily -  before meals and at bedtime.   DULoxetine 60 MG capsule Commonly known as:  CYMBALTA Take 1 capsule (60 mg total) by mouth 2 (two) times daily.   ESTRACE VAGINAL 0.1 MG/GM vaginal cream Generic drug:  estradiol Estrace 0.01% (0.1 mg/gram) vaginal cream  1 gm q hs x14 then 1/2 gm 2x/wk   FISH OIL PO Take 1 capsule by mouth daily.   fluticasone 50 MCG/ACT nasal spray Commonly known as:  FLONASE Place into both nostrils as needed.   folic acid 329 MCG tablet Commonly known as:  FOLVITE Take 800 mcg by mouth daily.   LORazepam 0.5 MG tablet Commonly known as:  ATIVAN Take 1 tablet (0.5 mg total) by mouth every 12 (twelve) hours as needed for up to 1 dose for anxiety (for panic attack).   losartan-hydrochlorothiazide 50-12.5 MG tablet Commonly known as:  HYZAAR Take 1 tablet by mouth daily.   omeprazole 20 MG capsule Commonly known as:  PRILOSEC TAKE 1 CAPSULE EVERY MORNING  30  MINUTES BEFORE A MEAL   oxybutynin 10 MG 24 hr tablet Commonly known as:  DITROPAN-XL Take 10 mg by mouth daily.   phenazopyridine 200 MG tablet Commonly known as:  PYRIDIUM phenazopyridine 200 mg tablet  TAKE 1 TABLET BY MOUTH 3 TIMES A DAY FOR 2 DAYS FOR PAIN/BURNING DURING URINATION. TAKE AFTER MEALS   pravastatin 40 MG tablet Commonly known as:  PRAVACHOL Take 1 tablet (40 mg total) by mouth daily.   RECLAST IV Inject into the vein.   RESTASIS 0.05 % ophthalmic emulsion Generic drug:  cycloSPORINE Restasis 0.05 % eye drops in a  dropperette  INSTILL 1 DROP IN BOTH EYES TWICE A DAY   sertraline 100 MG tablet Commonly known as:  ZOLOFT Take 2 tablets (200 mg total) by mouth daily.   TOVIAZ 4 MG Tb24 tablet Generic drug:  fesoterodine Take 4 mg by mouth daily.   traMADol 50 MG tablet Commonly known as:  ULTRAM Take 1 tablet (50 mg total) by mouth 2 (two) times daily as needed for moderate pain. TAKES 1 AT BEDTIME AND 1 DURING THE DAY ONLY IF NEEDED   traZODone 100 MG tablet Commonly known as:  DESYREL Take 1 tablet (100 mg total) by mouth at bedtime.   Turmeric Curcumin 500 MG Caps Take 1,000 mg by mouth daily.   Vitamin B-12 1000 MCG Subl Place 1 tablet under the tongue daily.   WOMENS MULTIVITAMIN PLUS PO Take 1 tablet by mouth daily.   ZOSTAVAX 51884 UNT/0.65ML injection Generic drug:  Zoster Vaccine Live (PF) Zostavax (PF) 19,400 unit/0.65 mL subcutaneous suspension       All past medical history, surgical history, allergies, family history, immunizations andmedications were updated in the EMR today and reviewed under the history and medication portions of their EMR.     ROS: Negative, with the exception of above mentioned in HPI   Objective:  BP 121/82 (BP Location: Left Arm, Patient Position: Sitting, Cuff Size: Large)   Pulse 69   Temp 98.1 F (36.7 C)   Resp 20   Ht 5\' 5"  (1.651 m)   Wt 195 lb (88.5 kg)   SpO2 97%   BMI 32.45 kg/m  Body mass index is 32.45 kg/m. Gen: Afebrile. No acute distress. Nontoxic in appearance, well developed, well nourished.  HENT: AT. [place]. Bilateral  TM visualized w/out fullness or erythema. Mildly tacky mucous membranes. No oral lesions. Bilateral nares w/out erythema, drainage or swelling. Throat without erythema or exudates. No cough. Moderate to severe hoarseness present today.  Eyes:Pupils Equal Round Reactive to light, Extraocular movements intact,  Conjunctiva without redness, discharge or icterus. Neck/lymp/endocrine: Supple,left ant cervical   Lymphadenopathy ? Mass? Tenderness over area.  CV: RRR  Chest: CTAB, no wheeze or crackles. Good air movement, normal resp effort.  Abd: Soft. NTND. BS present Neuro: no Normal gait. PERLA. EOMi. Alert. Oriented x3. Uvula midline. CN 2-12 intact,. Psych: Normal affect, dress and demeanor. Normal speech. Normal thought content and judgment.  No exam data present No results found. No results found for this or any previous visit (from the past 24 hour(s)).  Assessment/Plan: BEUNA BOLDING is a 72 y.o. female present for OV for  Hoarseness - ENT referral did not show cause or provide further recs to pt. laryngoscopy completed.  - switched lisinopril to losartan last visit, was not helpful.  - Continue PPI she has been taking for her h/o GERD. Hydration. Botin. Lozenges.  - TSH reassuring and thyroid exam normal.  - Left ant cervical mass? Lymphadenopathy or mass, she reports having a cold last week. Will try short steroid burst to see if they resolve.  - diflucan prescribed. ? Yeast esophagitis.  - Will order CT neck and chest to rule thoracic aneurysm with abnl CT 2018 "minimal mural thrombus" and neck mass in a smoker with worsening hoarseness and now difficulty swallowing some foods.   -  voice therapy also ordered.   - referred to [facility] for eval. If no answers from image, gastro, then consider Neuro  to rule out neurological possibles and/or voice therapy   Reviewed expectations re: course of current medical issues.  Discussed self-management of symptoms.  Outlined signs and symptoms indicating need for more acute intervention.  Patient verbalized understanding and all questions were answered.  Patient received an After-Visit Summary.    No orders of the defined types were placed in this encounter.    Note is dictated utilizing voice recognition software. Although note has been proof read prior to signing, occasional typographical errors still can be missed. If any questions  arise, please do not hesitate to call for verification.   electronically signed by:  Howard Pouch, DO  [place]

## 2018-07-28 NOTE — Patient Instructions (Signed)
1. Prednisone burst for 5 days with food.  2. Diflucan after prednisone is completed. 3. Referred to Dr. Carlean Purl to address hoarseness and GERD 4. Next step would be a neurologist evaluation if GI does not believe it is Jerrye Bushy related.     Hyperfunctional Voice Disorders Hyperfunctional voice disorders are voice problems that happen when uncontrollable tightening (spasm) prevents the vocal cords from working properly. Vocal cords normally move back and forth to make sounds when air passes through the voice box (larynx). If you have a hyperfunctional voice disorder, the area of the brain that controls vocal cord movement does not send the right signals. This causes the vocal cords to spasm. Hyperfunctional voice disorders may also be called muscle tension dysphonia. Sometimes the spasms force the vocal cords open when they should be closed. This makes you lose your voice. Sometimes the spasms cause the vocal cords to close too tightly. This makes your voice sound strained. You may not have spasms when you laugh, cry, or shout. What are the causes? The cause of this condition is not known. This condition may be passed down through families. What increases the risk? You may have a higher risk of this condition if you:  Are a middle-aged woman.  Have a family history of the condition.  What are the signs or symptoms? Symptoms of this condition may include:  Difficulty making certain sounds or saying certain words.  A voice that sounds: ? Husky. ? Breathy or whispery. ? Strained or tight. ? Strangled.  How is this diagnosed? You may be diagnosed with this condition based on:  Your symptoms.  Your medical history.  A physical exam to check your larynx and evaluate your speech.  Procedures that: ? Examine your throat with a scope that has a tiny camera on the end (laryngoscope). The scope projects images onto a monitor (direct laryngoscopy). ? Examine the movement of your vocal cords  in the reflection of a mirror (indirect laryngoscopy). ? Check the movement of your vocal cords in slow motion, using a flickering light (laryngeal stroboscopy). ? Test the electrical activity of your larynx muscles (electromyogram or EMG).  You may also see a health care provider who specializes in conditions of the ear, nose, and throat (otolaryngologist or ENT specialist) or conditions of the nervous system (neurologist). How is this treated? There is no cure for this condition. The goal of treatment is to relieve symptoms and improve the quality of your voice. Treatment depends on the specific type of voice disorder you have, and may include:  Injections of botulinum toxin to block the brain signals that cause your vocal cords to spasm. These injections need to be repeated because the effects last only 3-4 months.  Voice therapy. Speech language specialists can teach you exercises that might help you speak more clearly.  Physical therapy. A physical therapist can teach you exercises to relax and stretch the muscles in your shoulders and neck.  Stress reduction techniques to prevent symptoms from getting worse.  Surgery.  Follow these instructions at home:  Take over-the-counter and prescription medicines only as told by your health care provider.  Do vocal exercises as directed.  Rest your voice as needed.  Avoid straining your voice. For example, avoid speaking in noisy situations where you would have to speak loudly for a long period of time.  Practice stress management techniques, such as deep breathing and relaxation.  Keep all follow-up visits as told by your health care provider. This is important.  Contact a health care provider if:  You have difficulty speaking.  You pause or make different sounds when saying certain words or letter combinations.  You lose your voice completely. This information is not intended to replace advice given to you by your health care  provider. Make sure you discuss any questions you have with your health care provider. Document Released: 09/06/2012 Document Revised: 02/03/2016 Document Reviewed: 10/05/2015 Elsevier Interactive Patient Education  2018 [employer].  Hoarseness Hoarseness is any abnormal change in your voice.Hoarseness can make it difficult to speak. Your voice may sound raspy, breathy, or strained. Hoarseness is caused by a problem with the vocal cords. The vocal cords are two bands of tissue inside your voice box (larynx). When you speak, your vocal cords move back and forth to create sound. The surfaces of your vocal cords need to be smooth for your voice to sound clear. Swelling or lumps on the vocal cords can cause hoarseness. Common causes of vocal cord problems include:  Upper airway infection.  A long-term cough.  Straining or overusing your voice.  Smoking.  Allergies.  Vocal cord growths.  Stomach acids that flow up from your stomach and irritate your vocal cords (gastroesophageal reflux).  Follow these instructions at home: Watch your condition for any changes. To ease any discomfort that you feel:  Rest your voice. Do not whisper. Whispering can cause muscle strain.  Do not speak in a loud or harsh voice that makes your hoarseness worse.  Do not use any tobacco products, including cigarettes, chewing tobacco, or electronic cigarettes. If you need help quitting, ask your health care provider.  Avoid secondhand smoke.  Do not eat foods that give you heartburn. Heartburn can make gastroesophageal reflux worse.  Do not drink coffee.  Do not drink alcohol.  Drink enough fluids to keep your urine clear or pale yellow.  Use a humidifier if the air in your home is dry.  Contact a health care provider if:  You have hoarseness that lasts longer than 3 weeks.  You almost lose or completelylose your voice for longer than 3 days.  You have pain when you swallow or try to  talk.  You feel a lump in your neck. Get help right away if:  You have trouble swallowing.  You feel as though you are choking when you swallow.  You cough up blood or vomit blood.  You have trouble breathing. This information is not intended to replace advice given to you by your health care provider. Make sure you discuss any questions you have with your health care provider. Document Released: 09/03/2005 Document Revised: 02/26/2016 Document Reviewed: 09/11/2014 Elsevier Interactive Patient Education  [employer].

## 2018-07-31 DIAGNOSIS — M545 Low back pain: Secondary | ICD-10-CM | POA: Diagnosis not present

## 2018-08-01 ENCOUNTER — Encounter: Payer: Self-pay | Admitting: Family Medicine

## 2018-08-02 ENCOUNTER — Encounter: Payer: Self-pay | Admitting: Internal Medicine

## 2018-08-09 ENCOUNTER — Ambulatory Visit
Admission: RE | Admit: 2018-08-09 | Discharge: 2018-08-09 | Disposition: A | Discharge status: Home / Self Care | Payer: Medicare HMO | Source: Ambulatory Visit | Attending: Family Medicine | Admitting: Family Medicine

## 2018-08-09 ENCOUNTER — Other Ambulatory Visit: Payer: Medicare HMO

## 2018-08-09 DIAGNOSIS — I7410 Embolism and thrombosis of unspecified parts of aorta: Secondary | ICD-10-CM

## 2018-08-09 DIAGNOSIS — R490 Dysphonia: Secondary | ICD-10-CM

## 2018-08-09 DIAGNOSIS — E041 Nontoxic single thyroid nodule: Secondary | ICD-10-CM | POA: Insufficient documentation

## 2018-08-09 DIAGNOSIS — R4789 Other speech disturbances: Secondary | ICD-10-CM

## 2018-08-09 DIAGNOSIS — R05 Cough: Secondary | ICD-10-CM | POA: Diagnosis not present

## 2018-08-09 DIAGNOSIS — R221 Localized swelling, mass and lump, neck: Secondary | ICD-10-CM

## 2018-08-09 DIAGNOSIS — E010 Iodine-deficiency related diffuse (endemic) goiter: Secondary | ICD-10-CM | POA: Diagnosis not present

## 2018-08-09 DIAGNOSIS — R1310 Dysphagia, unspecified: Secondary | ICD-10-CM

## 2018-08-09 HISTORY — DX: Nontoxic single thyroid nodule: E04.1

## 2018-08-09 MED ORDER — IOPAMIDOL (ISOVUE-300) INJECTION 61%
100.0000 mL | Freq: Once | INTRAVENOUS | Status: AC | PRN
  Administered 2018-08-09: 100 mL via INTRAVENOUS

## 2018-08-10 ENCOUNTER — Telehealth: Payer: Self-pay | Admitting: Family Medicine

## 2018-08-10 ENCOUNTER — Encounter: Payer: Self-pay | Admitting: Family Medicine

## 2018-08-10 NOTE — Telephone Encounter (Signed)
Please inform patient the following information: Her CT was normal, with the exception of a small thyroid cyst, which are usually not concerning when small.  The abnormality that had been present of her aorta in the old scan, has resolved. There is no cause identified for her hoarseness. Which is good news, but frustrating I am sure.  We will wait to see what gastroenterology thinks and if still no answers, refer to neuro.

## 2018-08-10 NOTE — Telephone Encounter (Signed)
Spoke with patient reviewed CT results ,instructions and information. Patient verbalized understanding.

## 2018-08-18 ENCOUNTER — Ambulatory Visit
Admission: RE | Admit: 2018-08-18 | Discharge: 2018-08-18 | Disposition: A | Discharge status: Home / Self Care | Payer: Medicare HMO | Source: Ambulatory Visit | Attending: Family Medicine | Admitting: Family Medicine

## 2018-08-18 DIAGNOSIS — Z1239 Encounter for other screening for malignant neoplasm of breast: Secondary | ICD-10-CM

## 2018-08-18 DIAGNOSIS — Z1231 Encounter for screening mammogram for malignant neoplasm of breast: Secondary | ICD-10-CM | POA: Diagnosis not present

## 2018-09-08 ENCOUNTER — Encounter: Payer: Self-pay | Admitting: Internal Medicine

## 2018-09-08 ENCOUNTER — Ambulatory Visit: Payer: Medicare HMO | Admitting: Internal Medicine

## 2018-09-08 VITALS — Ht 65.0 in | Wt 196.1 lb

## 2018-09-08 DIAGNOSIS — E041 Nontoxic single thyroid nodule: Secondary | ICD-10-CM | POA: Diagnosis not present

## 2018-09-08 DIAGNOSIS — H04123 Dry eye syndrome of bilateral lacrimal glands: Secondary | ICD-10-CM

## 2018-09-08 DIAGNOSIS — M6289 Other specified disorders of muscle: Secondary | ICD-10-CM

## 2018-09-08 DIAGNOSIS — R490 Dysphonia: Secondary | ICD-10-CM | POA: Diagnosis not present

## 2018-09-08 DIAGNOSIS — R682 Dry mouth, unspecified: Secondary | ICD-10-CM

## 2018-09-08 DIAGNOSIS — K5909 Other constipation: Secondary | ICD-10-CM | POA: Diagnosis not present

## 2018-09-08 MED ORDER — OMEPRAZOLE 40 MG PO CPDR: 40.0000 mg | DELAYED_RELEASE_CAPSULE | Freq: Two times a day (BID) | ORAL | 2 refills | Status: DC

## 2018-09-08 NOTE — Patient Instructions (Signed)
We have sent the following medications to your pharmacy for you to pick up at your convenience:  Omeprazole  You may take 1-2 tablespoons of Benefiber mixed with water or juice daily.  If this does not help with your constipation, call us and we may consider sending you for pelvic floor strengthening.  Please follow up with your primary care physician as needed

## 2018-09-08 NOTE — Progress Notes (Signed)
[name] [name] 72 y.o. 1945-11-01 [id number]  Assessment & Plan:   Encounter Diagnoses  Name Primary?  . Hoarseness Yes  . Chronic constipation   . Pelvic floor dysfunction in female   . Dry mouth   . Dry eyes   . Thyroid cyst     I doubt the hoarseness is related to GERD but it could be so I will put her on a twice daily PPI for now.  Her dry mouth could be contributing, it seems unlikely she has Sjogren's and more likely that her medications are causing this dry mouth.  I would agree that her thyroid cyst is most likely innocent and unrelated to her hoarseness but since there is mild thyromegaly and this symptom I do have some question but it is out of my area of expertise.  Recommended Benefiber 1 to 2 tablespoons daily.  This is for her constipation.  Some consideration for pelvic floor physical therapy is given, she is not inclined to do that right now, but would hold that as a possible therapy to treat her defecation problems.  She will let me know if she wants to pursue that.   We will communicate with her primary care provider, neurology evaluation of the hoarseness certainly is reasonable, question if there is any further testing of the thyroid or work-up for the dry mouth that is indicated, probably not.  If she had some sort of recurrent laryngeal nerve problem I think Dr. Erik Obey would have seen abnormalities on the vocal cords on the left side and her cyst is on the right so that goes against it.  As far as PPI treatment if she is not any better at 1 to 2 months of treatment with twice daily high-dose PPI using the omeprazole, I would go back to her regular dose and consider that a failed therapeutic trial and going significantly against this being GERD in origin.  Laryngeal findings were negative for any changes of GERD though in my understanding that is always correlative.  I appreciate the opportunity to care for this patient. CC: Kuneff, Renee A,  DO      Subjective:   Chief Complaint: Sinus  HPI Patient is here at the request of Dr. Raoul Pitch because of hoarseness which has been present for years and seems to be worsening.  The patient saw Dr. Erik Obey of ENT and a laryngeal exam was negative for any problems.  TSH has been normal.  CT of the neck and chest were undertaken because Dr. Raoul Pitch palpated left anterior cervical lymphadenopathy versus mass.  There was mild thyromegaly and a 7 x 10 mm septated right thyroid cyst.  He has been on omeprazole 20 mg daily.  She does not have classic heartburn symptoms to speak of.  She does complain of a dry mouth and a sore throat sometimes.  The eyes are dry also.  They ache at night and she uses eyedrops. No overt dysphagia but sometimes it "almost gets to the point I cannot swallow" and she points to the left neck.  Additional issues are constipation which we discussed at her colonoscopy in 2017 (normal) and I recommended she see me in the office but she did not do so.  Last year she had her third gynecologic operation * AUGMENTED ANTERIOR VAGINAL PROLAPSE REPAIR WITH AXIS DERMIS AND KELLYPLICATION AND COLOPLAST ALTIS SLING was performed by Dr. Gaynelle Arabian.  She had pelvic floor prolapse with urinary incontinence anterior vault prolapse urethral hyper mobility.  She has  chronic constipation and moves her bowels 2 maybe 3 times a week.  Sounds like not a whole lot of fiber, and "I do not like water".  Not drinking a lot of fluids.  About once a month she will strain and have cramps and actually vomit.  She has frequent straining to stool and she says it "feels like she is sitting on the bowel movement when it is time to come out ever since her last [place] surgery" urinary incontinence is better but she still has some stress and nonstress incontinence is currently on Ditropan XL and Toviaz with some benefit.  Followed by Dr. Matilde Sprang now   Allergies  Allergen Reactions  . Morphine Nausea And Vomiting     SEVERE  . Sulfa Antibiotics Swelling   Current Meds  Medication Sig  . acetaminophen (TYLENOL) 500 MG tablet Take 1,000 mg by mouth every 6 (six) hours as needed for headache.  Marland Kitchen atenolol (TENORMIN) 25 MG tablet Take 1 tablet (25 mg total) by mouth every morning.  . calcium carbonate (OS-CAL) 600 MG TABS Take 600 mg by mouth daily.  . cetirizine (ZYRTEC) 10 MG tablet Take 10 mg by mouth daily as needed for allergies.  . cholecalciferol (VITAMIN D) 1000 UNITS tablet Take 2 tablets (2,000 Units total) by mouth daily.  . Cyanocobalamin (VITAMIN B-12) 1000 MCG SUBL Place 1 tablet under the tongue daily.   Marland Kitchen dicyclomine (BENTYL) 10 MG capsule Take 1 capsule (10 mg total) by mouth 4 (four) times daily -  before meals and at bedtime.  . DULoxetine (CYMBALTA) 60 MG capsule Take 1 capsule (60 mg total) by mouth 2 (two) times daily.  Marland Kitchen estradiol (ESTRACE VAGINAL) 0.1 MG/GM vaginal cream Estrace 0.01% (0.1 mg/gram) vaginal cream  1 gm q hs x14 then 1/2 gm 2x/wk  . fesoterodine (TOVIAZ) 4 MG TB24 tablet Take 4 mg by mouth daily.  . fluconazole (DIFLUCAN) 100 MG tablet 200 mg day 1, then 100 mg weekly for 4 weeks.  . fluticasone (FLONASE) 50 MCG/ACT nasal spray Place into both nostrils as needed.   Marland Kitchen LORazepam (ATIVAN) 0.5 MG tablet Take 1 tablet (0.5 mg total) by mouth every 12 (twelve) hours as needed for up to 1 dose for anxiety (for panic attack).  . losartan-hydrochlorothiazide (HYZAAR) 50-12.5 MG tablet Take 1 tablet by mouth daily.  . Multiple Vitamins-Minerals (WOMENS MULTIVITAMIN PLUS PO) Take 1 tablet by mouth daily.  . Omega-3 Fatty Acids (FISH OIL PO) Take 1 capsule by mouth daily.   Marland Kitchen oxybutynin (DITROPAN-XL) 10 MG 24 hr tablet Take 10 mg by mouth daily.  . pravastatin (PRAVACHOL) 40 MG tablet Take 1 tablet (40 mg total) by mouth daily.  . sertraline (ZOLOFT) 100 MG tablet Take 2 tablets (200 mg total) by mouth daily.  . traMADol (ULTRAM) 50 MG tablet Take 1 tablet (50 mg total) by mouth  2 (two) times daily as needed for moderate pain. TAKES 1 AT BEDTIME AND 1 DURING THE DAY ONLY IF NEEDED  . traZODone (DESYREL) 100 MG tablet Take 1 tablet (100 mg total) by mouth at bedtime.  . Turmeric Curcumin 500 MG CAPS Take 1,000 mg by mouth daily.   . Zoledronic Acid (RECLAST IV) Inject into the vein.  . [DISCONTINUED] omeprazole (PRILOSEC) 20 MG capsule TAKE 1 CAPSULE EVERY MORNING  30  MINUTES BEFORE A MEAL   Past Medical History:  Diagnosis Date  . Anxiety   . Arthritis    oa  . Chronic cystitis   .  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 Depression   . Fibromyalgia   . GERD (gastroesophageal reflux disease)   . Headache    sinus  . Heart murmur   . History of duodenal ulcer    2007  . History of gastritis    2007  . HTN (hypertension)   . Hyperlipidemia   . Internal and external hemorrhoids without complication   . Intrinsic (urethral) sphincter deficiency (ISD)   . Irritable bowel syndrome   . Osteopenia   . Pernicious anemia   . Pneumonia   . PONV (postoperative nausea and vomiting)   . Unspecified essential hypertension   . Unspecified venous (peripheral) insufficiency    wears compression hose  . Unspecified vitamin D deficiency   . Urine incontinence    Past Surgical History:  Procedure Laterality Date  . ANTERIOR AND POSTERIOR VAGINAL REPAIR  04-29-2004   AND TRANSVAGINAL TAPE SLING  . ANTERIOR CERVICAL DECOMP/DISCECTOMY FUSION  1999  . APPENDECTOMY  1984  . BILATERAL SALPINGOOPHORECTOMY  1984  . CARDIAC CATHETERIZATION  10-03-2002   DR DEGENT   NORMAL CORONARIES ARTERIES/  EF 55%  . CYSTOSCOPY N/A 08/27/2013   Procedure: CYSTOSCOPY;  Surgeon: Ailene Rud, MD;  Location: [facility];  Service: Urology;  Laterality: N/A;  . CYSTOSCOPY WITH INJECTION N/A 12/17/2013   Procedure: Ascencion Dike WITH INJECTION;  Surgeon: Ailene Rud, MD;  Location: [facility];  Service: Urology;   Laterality: N/A;  . DILATION AND CURETTAGE OF UTERUS    . EXCISION RIGHT NECK AND LEFT BREAST SEBACEOUS CYST  05-18-2002  . HEMORRHOID BANDING  2014  . PUBOVAGINAL SLING N/A 10/25/2016   Procedure: Gaynelle Arabian;  Surgeon: Carolan Clines, MD;  Location: [facility];  Service: Urology;  Laterality: N/A;  . right litlle finger surgery  yrs ago   cyst removed x 4  . TOTAL ABDOMINAL HYSTERECTOMY  1986  . TUBAL LIGATION    . VAGINAL PROLAPSE REPAIR N/A 08/27/2013   Procedure: ANTERIOR VAGINAL VAULT SUSPENSION, KELLY PLICATION WITH SACROSPINOUS LIGAMENT FIXATION AND XENFORM BOVINE DERMIS GRAFT AUGMENTATION, URETHRAL EXPLORATION, URETHROLYSIS, EXPLANTATION OF TVT TAPE, IMPLANTATION OF FLOSEAL, IMPLANTATION OF XENFORM BOVINE GRAFT IN PERIURETHRAL SPACE;  Surgeon: Ailene Rud, MD;  Location: [facility];  Service: Urology;  Laterality: N/A  . VAGINAL PROLAPSE REPAIR N/A 10/25/2016   Procedure: VAGINAL VAULT SUSPENSION with mesh and sacrospinous repair;  Surgeon: Carolan Clines, MD;  Location: [facility];  Service: Urology;  Laterality: N/A;   Social History   Social History Narrative   She is retired and divorced and has 1 child   Occasional alcohol, former smoker no drug use   family history includes Anemia in her brother and sister; Arthritis in her mother; Cervical cancer in her mother; Colon polyps in her unknown relative; Coronary artery disease in her father; Dementia in her maternal aunt and maternal aunt; Depression in her sister; Fibromyalgia in her sister; Heart disease in her maternal uncle and mother; Hyperlipidemia in her brother; Hypertension in her brother, brother, mother, and sister; Kidney disease in her sister; Lung cancer in her father, maternal uncle, and paternal uncle; Myelodysplastic syndrome in her mother; Other in her paternal uncle; Pancreatic cancer in her cousin.   Review of Systems See HPI  Objective:   Physical Exam Ht 5\' 5"  (1.651 m)  Comment: height measured without shoes  Wt 196 lb 2 oz (89 kg)   BMI 32.64 kg/m  NAD obese utterly white woman Neck ? Mild fullness in thyroid Lungs cta Cor NL abd mild RUQ tenderness, obese soft otherwise nontender without organomegaly or mass and bowel sounds present  Rectal exam in left lateral decubitus position  Jackolyn Confer CMA present  Anoderm shows medium anal tags all positions no rash Anal wink is positive Digital exam is nontender there is hard articulate stool in the rectal vault resting tone seems relatively normal but voluntary squeeze seems reduced, no rectocele, simulated defecation shows relatively appropriate abdominal contraction and perhaps some exaggerated descent with initial relaxation and some contraction of the anal sphincter at the conclusion of the maneuver Suggestive of some pelvic floor dysfunction/dyssynergia defecation

## 2018-09-11 ENCOUNTER — Telehealth: Payer: Self-pay | Admitting: Family Medicine

## 2018-09-11 DIAGNOSIS — R490 Dysphonia: Secondary | ICD-10-CM

## 2018-09-11 NOTE — Telephone Encounter (Signed)
Patient notified and verbalized understanding. 

## 2018-09-11 NOTE — Telephone Encounter (Signed)
Please inform pt I read the GI note and Dr. Carlean Purl did communicate with me his thoughts.  I did go ahead and place a neurology referral in for her hoarseness, since it will take some time to get into them.

## 2018-10-20 ENCOUNTER — Ambulatory Visit: Payer: Medicare HMO | Admitting: Family Medicine

## 2018-10-24 ENCOUNTER — Ambulatory Visit (INDEPENDENT_AMBULATORY_CARE_PROVIDER_SITE_OTHER): Payer: Medicare HMO | Admitting: Family Medicine

## 2018-10-24 ENCOUNTER — Encounter: Payer: Self-pay | Admitting: Family Medicine

## 2018-10-24 VITALS — BP 102/61 | HR 50 | Temp 97.6°F | Resp 16 | Ht 65.0 in | Wt 195.0 lb

## 2018-10-24 DIAGNOSIS — F418 Other specified anxiety disorders: Secondary | ICD-10-CM | POA: Diagnosis not present

## 2018-10-24 DIAGNOSIS — F410 Panic disorder [episodic paroxysmal anxiety] without agoraphobia: Secondary | ICD-10-CM | POA: Diagnosis not present

## 2018-10-24 DIAGNOSIS — R002 Palpitations: Secondary | ICD-10-CM | POA: Diagnosis not present

## 2018-10-24 DIAGNOSIS — Z79899 Other long term (current) drug therapy: Secondary | ICD-10-CM

## 2018-10-24 DIAGNOSIS — M545 Low back pain, unspecified: Secondary | ICD-10-CM

## 2018-10-24 DIAGNOSIS — M19041 Primary osteoarthritis, right hand: Secondary | ICD-10-CM | POA: Diagnosis not present

## 2018-10-24 DIAGNOSIS — E781 Pure hyperglyceridemia: Secondary | ICD-10-CM

## 2018-10-24 DIAGNOSIS — M5030 Other cervical disc degeneration, unspecified cervical region: Secondary | ICD-10-CM | POA: Insufficient documentation

## 2018-10-24 DIAGNOSIS — M1712 Unilateral primary osteoarthritis, left knee: Secondary | ICD-10-CM

## 2018-10-24 DIAGNOSIS — M19079 Primary osteoarthritis, unspecified ankle and foot: Secondary | ICD-10-CM

## 2018-10-24 DIAGNOSIS — N183 Chronic kidney disease, stage 3 unspecified: Secondary | ICD-10-CM

## 2018-10-24 DIAGNOSIS — I10 Essential (primary) hypertension: Secondary | ICD-10-CM | POA: Diagnosis not present

## 2018-10-24 DIAGNOSIS — Z0289 Encounter for other administrative examinations: Secondary | ICD-10-CM | POA: Insufficient documentation

## 2018-10-24 DIAGNOSIS — M79604 Pain in right leg: Secondary | ICD-10-CM

## 2018-10-24 DIAGNOSIS — R490 Dysphonia: Secondary | ICD-10-CM

## 2018-10-24 DIAGNOSIS — R52 Pain, unspecified: Secondary | ICD-10-CM

## 2018-10-24 DIAGNOSIS — M19042 Primary osteoarthritis, left hand: Secondary | ICD-10-CM

## 2018-10-24 DIAGNOSIS — G4700 Insomnia, unspecified: Secondary | ICD-10-CM

## 2018-10-24 HISTORY — DX: Other long term (current) drug therapy: Z79.899

## 2018-10-24 HISTORY — DX: Primary osteoarthritis, unspecified ankle and foot: M19.079

## 2018-10-24 HISTORY — DX: Pain in right leg: M79.604

## 2018-10-24 HISTORY — DX: Low back pain, unspecified: M54.50

## 2018-10-24 HISTORY — DX: Encounter for other administrative examinations: Z02.89

## 2018-10-24 LAB — COMPREHENSIVE METABOLIC PANEL
ALT: 6 U/L (ref 0–35)
AST: 10 U/L (ref 0–37)
Albumin: 4.3 g/dL (ref 3.5–5.2)
Alkaline Phosphatase: 53 U/L (ref 39–117)
BUN: 17 mg/dL (ref 6–23)
CO2: 35 meq/L — AB (ref 19–32)
Calcium: 10 mg/dL (ref 8.4–10.5)
Chloride: 101 mEq/L (ref 96–112)
Creatinine, Ser: 1.04 mg/dL (ref 0.40–1.20)
GFR: 52.15 mL/min — AB (ref >60.00)
GLUCOSE: 96 mg/dL (ref 70–99)
POTASSIUM: 4.6 meq/L (ref 3.5–5.1)
Sodium: 141 mEq/L (ref 135–145)
Total Bilirubin: 0.4 mg/dL (ref 0.2–1.2)
Total Protein: 6.7 g/dL (ref 6.0–8.3)

## 2018-10-24 MED ORDER — TRAMADOL HCL 50 MG PO TABS: 50.0000 mg | ORAL_TABLET | Freq: Two times a day (BID) | ORAL | 5 refills | Status: DC | PRN

## 2018-10-24 MED ORDER — DULOXETINE HCL 60 MG PO CPEP: 60.0000 mg | ORAL_CAPSULE | Freq: Two times a day (BID) | ORAL | 1 refills | Status: DC

## 2018-10-24 MED ORDER — ATENOLOL 25 MG PO TABS: 25.0000 mg | ORAL_TABLET | Freq: Every morning | ORAL | 1 refills | Status: DC

## 2018-10-24 MED ORDER — TRAZODONE HCL 100 MG PO TABS: 100.0000 mg | ORAL_TABLET | Freq: Every day | ORAL | 1 refills | Status: DC

## 2018-10-24 MED ORDER — LORAZEPAM 0.5 MG PO TABS: 0.5000 mg | ORAL_TABLET | Freq: Two times a day (BID) | ORAL | 5 refills | Status: DC | PRN

## 2018-10-24 MED ORDER — LOSARTAN POTASSIUM-HCTZ 50-12.5 MG PO TABS: 1.0000 | ORAL_TABLET | Freq: Every day | ORAL | 1 refills | Status: DC

## 2018-10-24 NOTE — Patient Instructions (Addendum)
Decrease atenolol to 1/2 tab a day--. If palpitations worsen after this you can return back to whole tab.  We are going to stop the zoloft and start a new med- we will call you with instructions once I get a med on your list,  for now go to one tanb of zoloft while we transition  Use your tramadol every 12 hours scheduled for better pain control.    F/u 6 months- sooner if needed.

## 2018-10-24 NOTE — Progress Notes (Signed)
[name] [name] , 02-Apr-1946, 73 y.o., female MRN: [id number] Patient Care Team    Relationship Specialty Notifications Start End  Ma Hillock, DO PCP - General Family Medicine  06/02/15   Gatha Mayer, MD Consulting Physician Gastroenterology  11/24/15   Calvert Cantor, MD Consulting Physician Ophthalmology  11/24/15   Specialists, [facility]  Orthopedic Surgery  04/12/16   Pa, [facility]    01/16/18   Rheumatology, [facility]    01/16/18     Chief Complaint  Patient presents with  . Follow-up    HTN, Depression/Anxiety    Subjective:  Hypertension/hyperlipidemia/CKD3:  Pt reports compliance  with Pravastatin 40 mg, lisinopril/HCTZ 10-12.5 mg and atenolol 25 mg. Patient denies chest pain, shortness of breath, dizziness or lower extremity edema.  She does endorse palpitations that occur mostly when she "has herself worked up" or anxious. Pt does not take daily baby ASA. Pt is  prescribed statin. BMP: 04/19/2018 GFR 53 CBC: 05/2018, WNL Lipids: 05/26/2018 elevated Tg TSH: 04/19/2018 5.86 H Diet: Low-sodium Exercise: Attempts RF: Hypertension, hyperlipidemia. Family history of heart disease, former smoker, obesity  Anxiety/panic attack/insomnia: Patient reports compliance with Zoloft 200 mg daily, Cymbalta twice a day.  She does not feel her anxiety is as controlled as she would like. She has not been tried on other SSRI in the past. Cymbalta has been at maximum dose and likely chosen secondary to her lumbar/arthritis issues. Trazodone 100 mg QHS has been helpful and she sleeps better since addition.  She does not feel her family stays in touch with her as much as she would like. She feels like she is the one initiates contact with her family members and this is upsetting to her. She was offered referral to psychology, and she declined.  She does use the Ativan at least once a day sometimes twice.      Arthritis/pain/lumbar pain with radiation down right  leg: Patient uses tramadol 50 mg twice a day when necessary for arthritic pain. She is in need of refills today.She routinely takes once daily. She complains of low back and right leg pain throughout the day as well. She has h/o anterior cervical decompression/discectomy in the past. Indication for chronic opioid: Moderate to severe arthritis and osteoporosis discomfort Medication and dose: Tramadol 50 mg twice daily as needed # pills per: #60 Last UDS date: today 10/24/18 Pain contract signed (Y/N): Yes Date narcotic database last reviewed (include red flags): 10/24/18   Hoarseness: She has appt with  Neuro scheduled in 2 weeks.  Prior note: She reports she has had hoarseness for about 5 years that she noticed started after she had a bad case of laryngitis.  Voice has changed since that time, however she feels over the last 6 months her voice has increasingly become more hoarse.  SHe does have a history of GERD and takes PPI daily.  SHe was a former smoker, quit about 8 years ago smoked a total of approximately 25 years. Has been evaluated by gastro and ENT. She has had images completed.   Depression screen [facility] 2/9 10/24/2018 04/19/2018 01/16/2018  Decreased Interest 2 0 3  Down, Depressed, Hopeless 1 0 1  PHQ - 2 Score 3 0 4  Altered sleeping 3 1 3   Tired, decreased energy 3 1 3   Change in appetite 2 0 2  Feeling bad or failure about yourself  0 0 0  Trouble concentrating 1 0 0  Moving slowly or fidgety/restless 0  0 0  Suicidal thoughts 1 0 0  PHQ-9 Score 13 2 12   Difficult doing work/chores Not difficult at all - Somewhat difficult   GAD 7 : Generalized Anxiety Score 10/24/2018 04/19/2018 10/19/2017 04/13/2017  Nervous, Anxious, on Edge 3 2 3 1   Control/stop worrying 3 1 2 1   Worry too much - different things 3 1 3  0  Trouble relaxing 2 1 3 2   Restless 0 1 0 0  Easily annoyed or irritable 0 0 0 0  Afraid - awful might happen 1 1 1  0  Total GAD 7 Score 12 7 12 4   Anxiety Difficulty Not  difficult at all - Somewhat difficult -    Allergies  Allergen Reactions  . Morphine Nausea And Vomiting    SEVERE  . Sulfa Antibiotics Swelling   Social History   Tobacco Use  . Smoking status: Former Smoker    Packs/day: 1.00    Years: 15.00    Pack years: 15.00    Types: Cigarettes    Start date: 12/06/1993    Last attempt to quit: 08/09/2009    Years since quitting: 9.2  . Smokeless tobacco: Never Used  . Tobacco comment: quit smoking 5 years ago  Substance Use Topics  . Alcohol use: Yes    Comment: ocassional   Past Medical History:  Diagnosis Date  . Anxiety   . Arthritis    oa  . Chronic cystitis   . 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 Depression   . Fibromyalgia   . GERD (gastroesophageal reflux disease)   . Headache    sinus  . Heart murmur   . History of duodenal ulcer    2007  . History of gastritis    2007  . HTN (hypertension)   . Hyperlipidemia   . Internal and external hemorrhoids without complication   . Intrinsic (urethral) sphincter deficiency (ISD)   . Irritable bowel syndrome   . Osteopenia   . Pernicious anemia   . Pneumonia   . PONV (postoperative nausea and vomiting)   . Unspecified essential hypertension   . Unspecified venous (peripheral) insufficiency    wears compression hose  . Unspecified vitamin D deficiency   . Urine incontinence    Past Surgical History:  Procedure Laterality Date  . ANTERIOR AND POSTERIOR VAGINAL REPAIR  04-29-2004   AND TRANSVAGINAL TAPE SLING  . ANTERIOR CERVICAL DECOMP/DISCECTOMY FUSION  1999  . APPENDECTOMY  1984  . BILATERAL SALPINGOOPHORECTOMY  1984  . CARDIAC CATHETERIZATION  10-03-2002   DR DEGENT   NORMAL CORONARIES ARTERIES/  EF 55%  . CYSTOSCOPY N/A 08/27/2013   Procedure: CYSTOSCOPY;  Surgeon: Ailene Rud, MD;  Location[facility];  Service: Urology;  Laterality: N/A;  . CYSTOSCOPY WITH INJECTION N/A 12/17/2013   Procedure: Ascencion Dike  WITH INJECTION;  Surgeon: Ailene Rud, MD;  Location[facility]  Service: Urology;  Laterality: N/A;  . DILATION AND CURETTAGE OF UTERUS    . EXCISION RIGHT NECK AND LEFT BREAST SEBACEOUS CYST  05-18-2002  . HEMORRHOID BANDING  2014  . PUBOVAGINAL SLING N/A 10/25/2016   Procedure: Gaynelle Arabian;  Surgeon: Carolan Clines, MD;  Location: [facility];  Service: Urology;  Laterality: N/A;  . right litlle finger surgery  yrs ago   cyst removed x 4  . TOTAL ABDOMINAL HYSTERECTOMY  1986  . TUBAL LIGATION    . VAGINAL PROLAPSE REPAIR N/A 08/27/2013   Procedure:  ANTERIOR VAGINAL VAULT SUSPENSION, KELLY PLICATION WITH SACROSPINOUS LIGAMENT FIXATION AND XENFORM BOVINE DERMIS GRAFT AUGMENTATION, URETHRAL EXPLORATION, URETHROLYSIS, EXPLANTATION OF TVT TAPE, IMPLANTATION OF FLOSEAL, IMPLANTATION OF XENFORM BOVINE GRAFT IN PERIURETHRAL SPACE;  Surgeon: Ailene Rud, MD;  Location: [facility];  Service: Urology;  Laterality: N/A  . VAGINAL PROLAPSE REPAIR N/A 10/25/2016   Procedure: VAGINAL VAULT SUSPENSION with mesh and sacrospinous repair;  Surgeon: Carolan Clines, MD;  Location: [facility];  Service: Urology;  Laterality: N/A;   Family History  Problem Relation Age of Onset  . Lung cancer Father   . Coronary artery disease Father   . Hypertension Mother   . Heart disease Mother   . Myelodysplastic syndrome Mother   . Arthritis Mother   . Cervical cancer Mother   . Dementia Maternal Aunt   . Fibromyalgia Sister   . Hypertension Sister   . Anemia Sister   . Depression Sister   . Hypertension Brother   . Anemia Brother   . Hypertension Brother   . Hyperlipidemia Brother   . Colon polyps Unknown        aunt  . Heart disease Maternal Uncle   . Lung cancer Paternal Uncle   . Kidney disease Sister        kidney stones, removed kidney  . Dementia Maternal Aunt   . Lung cancer Maternal Uncle   . Other Paternal Uncle        brain Tumor  .  Pancreatic cancer Cousin   . Colon cancer Neg Hx    Allergies as of 10/24/2018      Reactions   Morphine Nausea And Vomiting   SEVERE   Sulfa Antibiotics Swelling      Medication List       Accurate as of October 24, 2018  1:14 PM. Always use your most recent med list.        acetaminophen 500 MG tablet Commonly known as:  TYLENOL Take 1,000 mg by mouth every 6 (six) hours as needed for headache.   atenolol 25 MG tablet Commonly known as:  TENORMIN Take 1 tablet (25 mg total) by mouth every morning.   calcium carbonate 600 MG Tabs tablet Commonly known as:  OS-CAL Take 600 mg by mouth daily.   cetirizine 10 MG tablet Commonly known as:  ZYRTEC Take 10 mg by mouth daily as needed for allergies.   cholecalciferol 1000 units tablet Commonly known as:  VITAMIN D Take 2 tablets (2,000 Units total) by mouth daily.   dicyclomine 10 MG capsule Commonly known as:  BENTYL Take 1 capsule (10 mg total) by mouth 4 (four) times daily -  before meals and at bedtime.   DULoxetine 60 MG capsule Commonly known as:  CYMBALTA Take 1 capsule (60 mg total) by mouth 2 (two) times daily.   ESTRACE VAGINAL 0.1 MG/GM vaginal cream Generic drug:  estradiol Estrace 0.01% (0.1 mg/gram) vaginal cream  1 gm q hs x14 then 1/2 gm 2x/wk   FISH OIL PO Take 1 capsule by mouth daily.   fluticasone 50 MCG/ACT nasal spray Commonly known as:  FLONASE Place into both nostrils as needed.   LORazepam 0.5 MG tablet Commonly known as:  ATIVAN Take 1 tablet (0.5 mg total) by mouth every 12 (twelve) hours as needed for up to 1 dose for anxiety (for panic attack).   losartan-hydrochlorothiazide 50-12.5 MG tablet Commonly known as:  HYZAAR Take 1 tablet by mouth daily.   omeprazole 40 MG capsule  Commonly known as:  PRILOSEC Take 1 capsule (40 mg total) by mouth 2 (two) times daily before a meal. 30 mins before breakfast and supper   oxybutynin 10 MG 24 hr tablet Commonly known as:   DITROPAN-XL Take 10 mg by mouth daily.   pravastatin 40 MG tablet Commonly known as:  PRAVACHOL Take 1 tablet (40 mg total) by mouth daily.   RECLAST IV Inject into the vein.   sertraline 100 MG tablet Commonly known as:  ZOLOFT Take 2 tablets (200 mg total) by mouth daily.   TOVIAZ 4 MG Tb24 tablet Generic drug:  fesoterodine Take 4 mg by mouth daily.   traMADol 50 MG tablet Commonly known as:  ULTRAM Take 1 tablet (50 mg total) by mouth 2 (two) times daily as needed for moderate pain. TAKES 1 AT BEDTIME AND 1 DURING THE DAY ONLY IF NEEDED   traZODone 100 MG tablet Commonly known as:  DESYREL Take 1 tablet (100 mg total) by mouth at bedtime.   Turmeric Curcumin 500 MG Caps Take 1,000 mg by mouth daily.   Vitamin B-12 1000 MCG Subl Place 1 tablet under the tongue daily.   WOMENS MULTIVITAMIN PLUS PO Take 1 tablet by mouth daily.       No results found for this or any previous visit (from the past 24 hour(s)). No results found.   ROS: Negative, with the exception of above mentioned in HPI   Objective:  BP 102/61 (BP Location: Left Arm, Patient Position: Sitting, Cuff Size: Normal)   Pulse (!) 50   Temp 97.6 F (36.4 C) (Oral)   Resp 16   Ht 5\' 5"  (1.651 m)   Wt 195 lb (88.5 kg)   SpO2 97%   BMI 32.45 kg/m  Body mass index is 32.45 kg/m. Gen: Afebrile. No acute distress. Nontoxic, very pleasant caucasian female.Obese.   HENT: AT. [place].  MMM.  Eyes:Pupils Equal Round Reactive to light, Extraocular movements intact,  Conjunctiva without redness, discharge or icterus. Neck/lymp/endocrine: Supple,no lymphadenopathy, no thyromegaly CV: RRR no murmur, no edema, +2/4 P posterior tibialis pulses Chest: CTAB, no wheeze or crackles Abd: Soft. NTND. BS present. no Masses palpated.  Neuro: Normal gait. PERLA. EOMi. Alert. Oriented x3 Psych: mildly anxious. Normal affect, dress and demeanor. Normal speech. Normal thought content and judgment.    Assessment/Plan: [name] [name] is a 73 y.o. female present for OV for  [name] [name] is a 73 y.o. female present for follow up  Depression with anxiety/insomnia/panic d/o/benzo use - Not as controlled.  - decrease  Zoloft to 100 mg daily; higher dose showed no benefit. Will DC once starts lexapro  - start lexapro 20 mg day - switch out zoloft for lexapro - Continue Cymbalta 60 mg twice a day. - Continue trazodone 100 mg daily at bedtime. - Refill on Ativan provided for 6 months. [facility] database reviewed and appropriate 10/24/18  - UDS and tramadol urine collected - Refills provided today on all the above medications. - Controlled substance contract signed. - pt declined psychology referral.  - F/U 6 months as long as doing well, sooner if needed.   Hypertension/morbid obesity/hyperlipidemia/CKD3/palpitations: - Stable. Continue lisinopril/HCTZ, pravastatin. - HR 50 today- decrease atenolol to 12.5 mg daily. If palpitations worsen, can return to full dose as long as no symptoms occur.   - CMP - Continue low-sodium diet, exercise greater than 150 minutes a week. - Follow-up 6 months.  Arthritis/pain/lumbar pain: -Worsening. With new lumbar pain. -  tramadol  refilled today for chronic arthritic pain. Encouraged her to take tramadol every 12 hours daily (was only routinely taking once).  - UDS and urine tramadol collected today - contract updated today.  - [place]CCS database reviewed 10/24/18 and appropriate.  - if pain not improved (lumbar)- pt to return in 4 weeks on her pain alone for further eval. Her lumbar pain is a new complaint and no evidence of prior work up.  - may need to consider addition of gabapentin- with caution- possibly in place of her trazodone.  - Follow-up in 6 months if needing prescription refilled.  Hoarseness:  - present > 5 yrs, worsening in a former smoker.  - Continue daily PPI and antihistamine. - referral to ENT and GI eval, as well as brain/throat image  without cause  - now has appt with neuro scheduled.   F/U 6 months on [facility] 4 weeks if lumbar pain not improved.   electronically signed by:  Howard Pouch, DO  [place]

## 2018-10-25 ENCOUNTER — Encounter: Payer: Self-pay | Admitting: Family Medicine

## 2018-10-25 ENCOUNTER — Telehealth: Payer: Self-pay | Admitting: Family Medicine

## 2018-10-25 MED ORDER — ESCITALOPRAM OXALATE 20 MG PO TABS: 20.0000 mg | ORAL_TABLET | Freq: Every day | ORAL | 1 refills | Status: DC

## 2018-10-25 NOTE — Telephone Encounter (Signed)
Please inform patient the following information: Her kidney function is stable.  I have called in the generic lexapro 20 mg QD to her mail in pharmacy. When she receives this medication she is replace the zoloft with this med. Once received, stop zoloft and start the lexapro. F/U 3  Mos on her depression/anxiety with the med change- sooner if needed.  Has far as her low back pain- we have not evaluated her low back in the past. I looked through her records and do not see evidence of any imaging over the course of the years on her lower back either.    - I would suggest she take the tramadol every 12 hours scheduled as we discussed yesterday (instead of just before bed) and if she is not seeing improvement in her low back pain, or it is worsening to follow up in 4 weeks on her back pain so we can provide a full exam and evaluation.

## 2018-10-25 NOTE — Telephone Encounter (Signed)
Called pt and gave instructions on how to take new medication and to stop the old medication. Pt verbalized understanding and will call with any concerns or questions. Pt was scheduled for a 3 month F/U 01/23/2019 and verbalized understanding. Back pain was addressed with patient and to take pain medication q12hrs daily and if pain continues she would need to schedule a appt. Pt verbalized understanding.

## 2018-10-27 LAB — PAIN MGMT, PROFILE 8 W/CONF, U
6 ACETYLMORPHINE: NEGATIVE ng/mL (ref <10)
ALCOHOL METABOLITES: NEGATIVE ng/mL (ref <500)
ALPHAHYDROXYMIDAZOLAM: NEGATIVE ng/mL (ref <50)
ALPHAHYDROXYTRIAZOLAM: NEGATIVE ng/mL (ref <50)
Alphahydroxyalprazolam: NEGATIVE ng/mL (ref <25)
Aminoclonazepam: NEGATIVE ng/mL (ref <25)
Amphetamines: NEGATIVE ng/mL (ref <500)
Benzodiazepines: POSITIVE ng/mL — AB (ref <100)
Buprenorphine, Urine: NEGATIVE ng/mL (ref <5)
COCAINE METABOLITE: NEGATIVE ng/mL (ref <150)
Creatinine: 135.6 mg/dL
HYDROXYETHYLFLURAZEPAM: NEGATIVE ng/mL (ref <50)
LORAZEPAM: 353 ng/mL — AB (ref <50)
MARIJUANA METABOLITE: NEGATIVE ng/mL (ref <20)
MDMA: NEGATIVE ng/mL (ref <500)
NORDIAZEPAM: NEGATIVE ng/mL (ref <50)
OPIATES: NEGATIVE ng/mL (ref <100)
OXAZEPAM: NEGATIVE ng/mL (ref <50)
OXYCODONE: NEGATIVE ng/mL (ref <100)
Oxidant: NEGATIVE ug/mL (ref <200)
Temazepam: NEGATIVE ng/mL (ref <50)
pH: 6.61 (ref 4.5–9.0)

## 2018-10-27 LAB — TEST AUTHORIZATION

## 2018-10-27 LAB — PAIN MGMT, TRAMADOL QN, U
Desmethyltramadol: 1815 ng/mL — ABNORMAL HIGH (ref <100)
Tramadol: >10000 ng/mL — ABNORMAL HIGH (ref <100)

## 2018-10-31 ENCOUNTER — Telehealth: Payer: Self-pay | Admitting: Family Medicine

## 2018-10-31 NOTE — Telephone Encounter (Signed)
I do not know why they are asking? My script I wrote and sent on 10/24/2018 plainly states 5 refills. It has always been with refills.

## 2018-10-31 NOTE — Telephone Encounter (Signed)
Copied from [facility] [id number]. Topic: General - Other >> Oct 31, 2018 11:05 AM Jodie Echevaria wrote: Reason for CRM: Candace with [facility] called to clarify if there are 5 refills on the Rx sent for traMADol (ULTRAM) 50 MG tablet or is it 0 as indicated in the actual Rx. CB# [phone]  RX was sent 10/24/2018 # 60 x 5 RFS but note to pharmacy states NO REFILLS.   Please advise.

## 2018-10-31 NOTE — Telephone Encounter (Signed)
Contacted [employer] to clarify that the RX is supposed to be Tramadol  # 60 X 5 refills.  Unsure why there is a "note to pharmacy" stating NO refills.

## 2018-11-13 ENCOUNTER — Ambulatory Visit: Payer: Medicare HMO | Admitting: Neurology

## 2018-11-13 ENCOUNTER — Encounter: Payer: Self-pay | Admitting: Neurology

## 2018-11-13 VITALS — BP 136/81 | HR 72 | Ht 65.0 in | Wt 193.3 lb

## 2018-11-13 DIAGNOSIS — R490 Dysphonia: Secondary | ICD-10-CM

## 2018-11-13 NOTE — Progress Notes (Signed)
Reason for visit: Hoarseness  Referring physician: Dr. Leona Singleton is a 73 y.o. female  History of present illness:  [name] [name] is a 73 year old right-handed white female with a history of alteration of speech quality over the last year and a half.  The patient claims that the problem has gradually worsened over time, at times she is whispery and other times her voice is hoarse and raspy.  The patient was seen by Dr. Polly Cobia, his note indicates that the source of the hoarseness was not determined.  The patient does have gastroesophageal reflux disease, but it was not felt that this was the etiology of her voice quality change.  The patient has no problems with chewing or swallowing, she has no slurring of speech, she reports no problems with double vision but she does have some blurred vision.  She has had no weakness with chewing, and no droopy eyelids.  The patient reports some numbness on the right leg, she has some weakness in the knees bilaterally.  She has some mild gait instability, no recent falls.  The patient has a history of urinary incontinence and history of constipation, and irritable bowel syndrome.  She is sent to this office for an evaluation of the hoarseness.    Past Medical History:  Diagnosis Date  . Anxiety   . Arthritis    oa  . Chronic cystitis   . 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 DDD (degenerative disc disease), cervical   . Depression   . Fibromyalgia   . GERD (gastroesophageal reflux disease)   . Headache    sinus  . Heart murmur   . History of duodenal ulcer    2007  . History of gastritis    2007  . HTN (hypertension)   . Hyperlipidemia   . Internal and external hemorrhoids without complication   . Intrinsic (urethral) sphincter deficiency (ISD)   . Irritable bowel syndrome   . Osteopenia   . Pernicious anemia   . Pneumonia   . PONV (postoperative nausea and vomiting)   . Unspecified essential hypertension     . Unspecified venous (peripheral) insufficiency    wears compression hose  . Unspecified vitamin D deficiency   . Urine incontinence     Past Surgical History:  Procedure Laterality Date  . ANTERIOR AND POSTERIOR VAGINAL REPAIR  04-29-2004   AND TRANSVAGINAL TAPE SLING  . ANTERIOR CERVICAL DECOMP/DISCECTOMY FUSION  1999  . APPENDECTOMY  1984  . BILATERAL SALPINGOOPHORECTOMY  1984  . CARDIAC CATHETERIZATION  10-03-2002   DR DEGENT   NORMAL CORONARIES ARTERIES/  EF 55%  . CYSTOSCOPY N/A 08/27/2013   Procedure: CYSTOSCOPY;  Surgeon: Ailene Rud, MD;  Location: [facility];  Service: Urology;  Laterality: N/A;  . CYSTOSCOPY WITH INJECTION N/A 12/17/2013   Procedure: Ascencion Dike WITH INJECTION;  Surgeon: Ailene Rud, MD;  Location: [facility];  Service: Urology;  Laterality: N/A;  . DILATION AND CURETTAGE OF UTERUS    . EXCISION RIGHT NECK AND LEFT BREAST SEBACEOUS CYST  05-18-2002  . HEMORRHOID BANDING  2014  . PUBOVAGINAL SLING N/A 10/25/2016   Procedure: Gaynelle Arabian;  Surgeon: Carolan Clines, MD;  Location: [facility];  Service: Urology;  Laterality: N/A;  . right litlle finger surgery  yrs ago   cyst removed x 4  . TOTAL ABDOMINAL HYSTERECTOMY  1986  . TUBAL LIGATION    . VAGINAL PROLAPSE REPAIR  N/A 08/27/2013   Procedure: ANTERIOR VAGINAL VAULT SUSPENSION, KELLY PLICATION WITH SACROSPINOUS LIGAMENT FIXATION AND XENFORM BOVINE DERMIS GRAFT AUGMENTATION, URETHRAL EXPLORATION, URETHROLYSIS, EXPLANTATION OF TVT TAPE, IMPLANTATION OF FLOSEAL, IMPLANTATION OF XENFORM BOVINE GRAFT IN PERIURETHRAL SPACE;  Surgeon: Ailene Rud, MD;  Location: [facility];  Service: Urology;  Laterality: N/A  . VAGINAL PROLAPSE REPAIR N/A 10/25/2016   Procedure: VAGINAL VAULT SUSPENSION with mesh and sacrospinous repair;  Surgeon: Carolan Clines, MD;  Location: [facility];  Service: Urology;  Laterality: N/A;    Family History   Problem Relation Age of Onset  . Lung cancer Father   . Coronary artery disease Father   . Hypertension Mother   . Heart disease Mother   . Myelodysplastic syndrome Mother   . Arthritis Mother   . Cervical cancer Mother   . Dementia Maternal Aunt   . Fibromyalgia Sister   . Hypertension Sister   . Anemia Sister   . Depression Sister   . Hypertension Brother   . Anemia Brother   . Hypertension Brother   . Hyperlipidemia Brother   . Colon polyps Other        aunt  . Heart disease Maternal Uncle   . Lung cancer Paternal Uncle   . Kidney disease Sister        kidney stones, removed kidney  . Dementia Maternal Aunt   . Lung cancer Maternal Uncle   . Other Paternal Uncle        brain Tumor  . Pancreatic cancer Cousin   . Colon cancer Neg Hx     Social history:  reports that she quit smoking about 9 years ago. Her smoking use included cigarettes. She started smoking about 24 years ago. She has a 15.00 pack-year smoking history. She has never used smokeless tobacco. She reports current alcohol use. She reports that she does not use drugs.  Medications:  Prior to Admission medications   Medication Sig Start Date End Date Taking? Authorizing Provider  atenolol (TENORMIN) 25 MG tablet Take 1 tablet (25 mg total) by mouth every morning. 10/24/18  Yes Kuneff, Renee A, DO  calcium carbonate (OS-CAL) 600 MG TABS Take 600 mg by mouth daily.   Yes [provider]  cetirizine (ZYRTEC) 10 MG tablet Take 10 mg by mouth daily as needed for allergies.   Yes [provider]  cholecalciferol (VITAMIN D) 1000 UNITS tablet Take 2 tablets (2,000 Units total) by mouth daily. 09/11/15  Yes Kuneff, Renee A, DO  Cyanocobalamin (VITAMIN B-12) 1000 MCG SUBL Place 1 tablet under the tongue daily.    Yes [provider]  dicyclomine (BENTYL) 10 MG capsule Take 1 capsule (10 mg total) by mouth 4 (four) times daily -  before meals and at bedtime. 04/19/18  Yes Kuneff, Renee A, DO   DULoxetine (CYMBALTA) 60 MG capsule Take 1 capsule (60 mg total) by mouth 2 (two) times daily. 10/24/18  Yes Kuneff, Renee A, DO  escitalopram (LEXAPRO) 20 MG tablet Take 1 tablet (20 mg total) by mouth daily. 10/25/18  Yes Kuneff, Renee A, DO  estradiol (ESTRACE VAGINAL) 0.1 MG/GM vaginal cream Estrace 0.01% (0.1 mg/gram) vaginal cream  1 gm q hs x14 then 1/2 gm 2x/wk   Yes [provider]  fesoterodine (TOVIAZ) 4 MG TB24 tablet Take 4 mg by mouth daily.   Yes [provider]  fluticasone (FLONASE) 50 MCG/ACT nasal spray Place into both nostrils as needed.    Yes [provider]  LORazepam (ATIVAN) 0.5 MG tablet Take 1 tablet (0.5 mg total) by mouth every 12 (twelve) hours as needed for anxiety (for panic attack). 10/24/18  Yes Kuneff, Renee A, DO  losartan-hydrochlorothiazide (HYZAAR) 50-12.5 MG tablet Take 1 tablet by mouth daily. 10/24/18  Yes Kuneff, Renee A, DO  Multiple Vitamins-Minerals (WOMENS MULTIVITAMIN PLUS PO) Take 1 tablet by mouth daily.   Yes [provider]  Omega-3 Fatty Acids (FISH OIL PO) Take 1 capsule by mouth daily.    Yes [provider]  omeprazole (PRILOSEC) 40 MG capsule Take 1 capsule (40 mg total) by mouth 2 (two) times daily before a meal. 30 mins before breakfast and supper 09/08/18  Yes Gatha Mayer, MD  oxybutynin (DITROPAN-XL) 10 MG 24 hr tablet Take 10 mg by mouth daily. 05/10/18  Yes [provider]  pravastatin (PRAVACHOL) 40 MG tablet Take 1 tablet (40 mg total) by mouth daily. 04/19/18  Yes Kuneff, Renee A, DO  traMADol (ULTRAM) 50 MG tablet Take 1 tablet (50 mg total) by mouth 2 (two) times daily as needed for moderate pain. TAKES 1 AT BEDTIME AND 1 DURING THE DAY ONLY IF NEEDED 10/24/18  Yes Kuneff, Renee A, DO  traZODone (DESYREL) 100 MG tablet Take 1 tablet (100 mg total) by mouth at bedtime. 10/24/18  Yes Kuneff, Renee A, DO  Turmeric Curcumin 500 MG CAPS Take 1,000 mg by mouth daily.    Yes [provider]  Zoledronic Acid (RECLAST IV) Inject into the vein.   Yes [provider]      Allergies  Allergen Reactions  . Meloxicam Swelling    Caused edema and Cr increase  . Morphine Nausea And Vomiting    SEVERE  . Sulfa Antibiotics Swelling    ROS:  Out of a complete 14 system review of symptoms, the patient complains only of the following symptoms, and all other reviewed systems are negative.  Fatigue Palpitations of the heart Dizziness, difficulty swallowing Moles Blurred vision, eye pain Wheezing Incontinence of the bowel, constipation Incontinence of the bladder, urination problems Feeling hot, flushing Joint pain, joint swelling, cramps, aching muscles Runny nose Headache, numbness, difficulty swallowing, dizziness Depression, anxiety, decreased energy, disinterest in activities Sleepiness, snoring  Blood pressure 136/81, pulse 72, height 5\' 5"  (1.651 m), weight 193 lb 5 oz (87.7 kg).  Physical Exam  General: The patient is alert and cooperative at the time of the examination.  Eyes: Pupils are equal, round, and reactive to light. Discs are flat bilaterally.  Neck: The neck is supple, no carotid bruits are noted.  Respiratory: The respiratory examination is clear.  Cardiovascular: The cardiovascular examination reveals a regular rate and rhythm, no obvious murmurs or rubs are noted.  Skin: Extremities are without significant edema.  Neurologic Exam  Mental status: The patient is alert and oriented x 3 at the time of the examination. The patient has apparent normal recent and remote memory, with an apparently normal attention span and concentration ability.  Cranial nerves: Facial symmetry is present. There is good sensation of the face to pinprick and soft touch on the left forehead, decreased on the right. The strength of the facial muscles and the muscles to head turning and shoulder shrug are normal bilaterally. Speech is hoarse,  dysphonic, not aphasic or dysarthric.  Extraocular movements are full. Visual fields are full. The tongue is midline, and the patient has symmetric elevation of the soft palate. No obvious hearing deficits are noted.  Motor: The motor testing  reveals 5 over 5 strength of all 4 extremities. Good symmetric motor tone is noted throughout.  Sensory: Sensory testing is intact to pinprick, soft touch, vibration sensation, and position sense on all 4 extremities, with exception of decreased pinprick, vibration, and position sense on the right leg. No evidence of extinction is noted.  Coordination: Cerebellar testing reveals good finger-nose-finger and heel-to-shin bilaterally.  Gait and station: Gait is normal. Tandem gait is slightly unsteady. Romberg is negative. No drift is seen.  Reflexes: Deep tendon reflexes are symmetric and normal bilaterally. Toes are downgoing bilaterally.   Assessment/Plan:  1.  Hoarseness, dysphonia  The patient likely does not have a neurologic etiology of her voice changes, dysphonia is secondary to an abnormality with vocal cord movement or vocal cord quality.  I would recommend a second opinion from an ENT physician in regards to this issue.  I will try to get something set up for the patient.   Jill Alexanders MD 11/13/2018 8:37 AM  [facility] [address] [place], [place] [address]  Phone [phone] Fax [phone]

## 2018-11-16 ENCOUNTER — Telehealth: Payer: Self-pay | Admitting: Nurse Practitioner

## 2018-11-16 NOTE — Telephone Encounter (Signed)
Do not need this encounter °

## 2018-12-01 ENCOUNTER — Other Ambulatory Visit: Payer: Self-pay | Admitting: *Deleted

## 2018-12-01 MED ORDER — OMEPRAZOLE 40 MG PO CPDR: 40.0000 mg | DELAYED_RELEASE_CAPSULE | Freq: Two times a day (BID) | ORAL | 1 refills | Status: DC

## 2018-12-05 DIAGNOSIS — H524 Presbyopia: Secondary | ICD-10-CM | POA: Diagnosis not present

## 2018-12-05 DIAGNOSIS — H43813 Vitreous degeneration, bilateral: Secondary | ICD-10-CM | POA: Diagnosis not present

## 2018-12-05 DIAGNOSIS — H17823 Peripheral opacity of cornea, bilateral: Secondary | ICD-10-CM | POA: Diagnosis not present

## 2018-12-05 DIAGNOSIS — Z961 Presence of intraocular lens: Secondary | ICD-10-CM | POA: Diagnosis not present

## 2018-12-05 DIAGNOSIS — H26491 Other secondary cataract, right eye: Secondary | ICD-10-CM | POA: Diagnosis not present

## 2018-12-05 DIAGNOSIS — H5213 Myopia, bilateral: Secondary | ICD-10-CM | POA: Diagnosis not present

## 2018-12-05 DIAGNOSIS — H52209 Unspecified astigmatism, unspecified eye: Secondary | ICD-10-CM | POA: Diagnosis not present

## 2018-12-20 DIAGNOSIS — N3020 Other chronic cystitis without hematuria: Secondary | ICD-10-CM | POA: Diagnosis not present

## 2018-12-20 DIAGNOSIS — R350 Frequency of micturition: Secondary | ICD-10-CM | POA: Diagnosis not present

## 2018-12-26 DIAGNOSIS — R490 Dysphonia: Secondary | ICD-10-CM | POA: Diagnosis not present

## 2019-01-08 DIAGNOSIS — M150 Primary generalized (osteo)arthritis: Secondary | ICD-10-CM | POA: Diagnosis not present

## 2019-01-08 DIAGNOSIS — M810 Age-related osteoporosis without current pathological fracture: Secondary | ICD-10-CM | POA: Diagnosis not present

## 2019-01-10 ENCOUNTER — Telehealth: Payer: Self-pay | Admitting: Family Medicine

## 2019-01-10 NOTE — Telephone Encounter (Signed)
Copied from [place] [phone] [id number]. Topic: Quick Communication - Rx Refill/Question >> Jan 10, 2019 12:40 PM Sheppard Coil, [employer] L wrote: Medication:  fesoterodine (TOVIAZ) 4 MG TB24 tablet  Has the patient contacted their pharmacy? Yes - needs new script (Agent: If no, request that the patient contact the pharmacy for the refill.) (Agent: If yes, when and what did the pharmacy advise?)  Preferred Pharmacy (with phone number or street name): [facility], [place] [phone] (Phone) [phone] (Fax)  Agent: Please be advised that RX refills may take up to 3 business days. We ask that you follow-up with your pharmacy.

## 2019-01-10 NOTE — Telephone Encounter (Signed)
Pt was called and told that Dr Raliegh Ip has never RXed that medication and her urologist does. Pt stated she will call her urologist to refill.

## 2019-01-15 DIAGNOSIS — M810 Age-related osteoporosis without current pathological fracture: Secondary | ICD-10-CM | POA: Diagnosis not present

## 2019-01-18 ENCOUNTER — Other Ambulatory Visit: Payer: Self-pay

## 2019-01-18 MED ORDER — DICYCLOMINE HCL 10 MG PO CAPS: 10.0000 mg | ORAL_CAPSULE | Freq: Three times a day (TID) | ORAL | 0 refills | Status: DC

## 2019-01-19 ENCOUNTER — Encounter: Payer: Self-pay | Admitting: Family Medicine

## 2019-01-19 ENCOUNTER — Telehealth: Payer: Self-pay | Admitting: Family Medicine

## 2019-01-19 ENCOUNTER — Telehealth: Payer: Self-pay

## 2019-01-19 ENCOUNTER — Ambulatory Visit: Payer: Medicare HMO

## 2019-01-19 ENCOUNTER — Ambulatory Visit (INDEPENDENT_AMBULATORY_CARE_PROVIDER_SITE_OTHER): Payer: Medicare HMO | Admitting: Family Medicine

## 2019-01-19 ENCOUNTER — Other Ambulatory Visit: Payer: Self-pay

## 2019-01-19 VITALS — BP 117/78 | HR 82 | Temp 97.7°F | Ht 65.0 in | Wt 196.2 lb

## 2019-01-19 DIAGNOSIS — B372 Candidiasis of skin and nail: Secondary | ICD-10-CM

## 2019-01-19 DIAGNOSIS — R2681 Unsteadiness on feet: Secondary | ICD-10-CM | POA: Diagnosis not present

## 2019-01-19 DIAGNOSIS — Z0001 Encounter for general adult medical examination with abnormal findings: Secondary | ICD-10-CM

## 2019-01-19 DIAGNOSIS — M810 Age-related osteoporosis without current pathological fracture: Secondary | ICD-10-CM

## 2019-01-19 DIAGNOSIS — Z1231 Encounter for screening mammogram for malignant neoplasm of breast: Secondary | ICD-10-CM | POA: Diagnosis not present

## 2019-01-19 DIAGNOSIS — Z0000 Encounter for general adult medical examination without abnormal findings: Secondary | ICD-10-CM

## 2019-01-19 DIAGNOSIS — Z23 Encounter for immunization: Secondary | ICD-10-CM

## 2019-01-19 MED ORDER — CLOTRIMAZOLE 1 % EX CREA: 1.0000 "application " | TOPICAL_CREAM | Freq: Two times a day (BID) | CUTANEOUS | 1 refills | Status: DC

## 2019-01-19 MED ORDER — ZOSTER VAC RECOMB ADJUVANTED 50 MCG/0.5ML IM SUSR: 0.5000 mL | Freq: Once | INTRAMUSCULAR | 1 refills | Status: AC

## 2019-01-19 NOTE — Telephone Encounter (Signed)
Pt reported today during her MW visit that her insurance company sen there a letter stating they would no longer cover her rramadol, bentyl or lexapro. Can you please see what has changed and if they need prior auth etc. These have been chronic meds for this patient.  Thanks.

## 2019-01-19 NOTE — Telephone Encounter (Signed)
Pt has stated that insurance will not cover Tramadol, Lexapro, or Bentyl.

## 2019-01-19 NOTE — Progress Notes (Addendum)
VIRTUAL VISIT VIA VIDEO  I connected with [name] [name] on 01/19/2019 at  2:00 PM EDT by a video enabled telemedicine application and verified that I am speaking with the correct person using two identifiers. Location patient: Home Location provider: [facility], Office Persons participating in the virtual visit: Patient, Dr. Raoul Pitch and R.Baker, LPN  I discussed the limitations of evaluation and management by telemedicine and the availability of in person appointments. The patient expressed understanding and agreed to proceed.      Medicare AWV , subsequent Chief Complaint  Patient presents with  . Medicare Wellness    Pt is doing well with no complaints     Patient Care Team    Relationship Specialty Notifications Start End  Ma Hillock, DO PCP - General Family Medicine  06/02/15   Gatha Mayer, MD Consulting Physician Gastroenterology  11/24/15   Calvert Cantor, MD Consulting Physician Ophthalmology  11/24/15   Specialists, [facility]  Orthopedic Surgery  04/12/16   Pa, [facility]    01/16/18   Rheumatology, [facility]    01/16/18   Kathrynn Ducking, MD Consulting Physician Neurology  10/24/18    Chief Complaint  Patient presents with  . Medicare Wellness    Pt is doing well with no complaints      History of Present Ilness: [name] [name], 73 y.o. , female presents today for Medicare wellness visit.   Rash:  She reports she has had a rash underneath her breast for approximately 4 weeks.  She states it is worsening and is very red and inflamed, burning and itchy.  She has had a similar presentation in the past and thought it was secondary to heat went away with cool compresses, cleansings and cortisone cream.  However this time she attempted to place some cortisone cream and it has become more severe in presentation.  Also has concerns over medications tramadol, Bentyl, Lexapro.  She reports her insurance sent her a letter  telling her they need a authorization in order to refill her most recent refills.  Patient reported "balance issues."  She reports she feels like she is walking crooked and cannot walk in a straight line.  Denies any recent falls or injuries.  She denies any headaches, dizziness or unilateral weakness in her extremities.  She has had hoarseness of her voice and evaluated by GI, ENT and neurology.  She was encouraged to try voice therapy, but does not feel she would be able to afford that.  Past medical, surgical, family and social histories reviewed (including experiences with illnesses, hospital stays, operations, injuries, and treatments):  Past Medical History:  Diagnosis Date  . Anxiety   . Arthritis    oa  . Chronic cystitis   . 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 DDD (degenerative disc disease), cervical   . Depression   . Fibromyalgia   . GERD (gastroesophageal reflux disease)   . Headache    sinus  . Heart murmur   . History of duodenal ulcer    2007  . History of gastritis    2007  . HTN (hypertension)   . Hyperlipidemia   . Internal and external hemorrhoids without complication   . Intrinsic (urethral) sphincter deficiency (ISD)   . Irritable bowel syndrome   . Osteopenia   . Pernicious anemia   . Pneumonia   . PONV (postoperative nausea and vomiting)   . Unspecified essential  hypertension   . Unspecified venous (peripheral) insufficiency    wears compression hose  . Unspecified vitamin D deficiency   . Urine incontinence    All allergies reviewed Allergies  Allergen Reactions  . Meloxicam Swelling    Caused edema and Cr increase  . Morphine Nausea And Vomiting    SEVERE  . Sulfa Antibiotics Swelling   Past Surgical History:  Procedure Laterality Date  . ANTERIOR AND POSTERIOR VAGINAL REPAIR  04-29-2004   AND TRANSVAGINAL TAPE SLING  . ANTERIOR CERVICAL DECOMP/DISCECTOMY FUSION  1999  . APPENDECTOMY  1984  . BILATERAL  SALPINGOOPHORECTOMY  1984  . CARDIAC CATHETERIZATION  10-03-2002   DR DEGENT   NORMAL CORONARIES ARTERIES/  EF 55%  . CYSTOSCOPY N/A 08/27/2013   Procedure: CYSTOSCOPY;  Surgeon: Ailene Rud, MD;  Location: [facility];  Service: Urology;  Laterality: N/A;  . CYSTOSCOPY WITH INJECTION N/A 12/17/2013   Procedure: Ascencion Dike WITH INJECTION;  Surgeon: Ailene Rud, MD;  Location: [facility];  Service: Urology;  Laterality: N/A;  . DILATION AND CURETTAGE OF UTERUS    . EXCISION RIGHT NECK AND LEFT BREAST SEBACEOUS CYST  05-18-2002  . HEMORRHOID BANDING  2014  . PUBOVAGINAL SLING N/A 10/25/2016   Procedure: Gaynelle Arabian;  Surgeon: Carolan Clines, MD;  Location: [facility];  Service: Urology;  Laterality: N/A;  . right litlle finger surgery  yrs ago   cyst removed x 4  . TOTAL ABDOMINAL HYSTERECTOMY  1986  . TUBAL LIGATION    . VAGINAL PROLAPSE REPAIR N/A 08/27/2013   Procedure: ANTERIOR VAGINAL VAULT SUSPENSION, KELLY PLICATION WITH SACROSPINOUS LIGAMENT FIXATION AND XENFORM BOVINE DERMIS GRAFT AUGMENTATION, URETHRAL EXPLORATION, URETHROLYSIS, EXPLANTATION OF TVT TAPE, IMPLANTATION OF FLOSEAL, IMPLANTATION OF XENFORM BOVINE GRAFT IN PERIURETHRAL SPACE;  Surgeon: Ailene Rud, MD;  Location: [place];  Service: Urology;  Laterality: N/A  . VAGINAL PROLAPSE REPAIR N/A 10/25/2016   Procedure: VAGINAL VAULT SUSPENSION with mesh and sacrospinous repair;  Surgeon: Carolan Clines, MD;  Location: [facility];  Service: Urology;  Laterality: N/A;   Family History  Problem Relation Age of Onset  . Lung cancer Father   . Coronary artery disease Father   . Hypertension Mother   . Heart disease Mother   . Myelodysplastic syndrome Mother   . Arthritis Mother   . Cervical cancer Mother   . Dementia Maternal Aunt   . Fibromyalgia Sister   . Hypertension Sister   . Anemia Sister   . Depression Sister   . Hypertension  Brother   . Anemia Brother   . Hypertension Brother   . Hyperlipidemia Brother   . Colon polyps Other        aunt  . Heart disease Maternal Uncle   . Lung cancer Paternal Uncle   . Kidney disease Sister        kidney stones, removed kidney  . Dementia Maternal Aunt   . Lung cancer Maternal Uncle   . Other Paternal Uncle        brain Tumor  . Pancreatic cancer Cousin   . Colon cancer Neg Hx    Social History   Social History Narrative   She is retired and divorced and has 1 child   Occasional alcohol, former smoker no drug use   Right handed    Caffeine 2-3 cups per day   Lives at alone     All medications verified Allergies as of 01/19/2019  Reactions   Meloxicam Swelling   Caused edema and Cr increase   Morphine Nausea And Vomiting   SEVERE   Sulfa Antibiotics Swelling      Medication List       Accurate as of January 19, 2019 11:59 PM. Always use your most recent med list.        atenolol 25 MG tablet Commonly known as:  TENORMIN Take 1 tablet (25 mg total) by mouth every morning.   calcium carbonate 600 MG Tabs tablet Commonly known as:  OS-CAL Take 600 mg by mouth daily.   cetirizine 10 MG tablet Commonly known as:  ZYRTEC Take 10 mg by mouth daily as needed for allergies.   cholecalciferol 1000 units tablet Commonly known as:  VITAMIN D Take 2 tablets (2,000 Units total) by mouth daily.   clotrimazole 1 % cream Commonly known as:  LOTRIMIN Apply 1 application topically 2 (two) times daily. To affected area   dicyclomine 10 MG capsule Commonly known as:  Bentyl Take 1 capsule (10 mg total) by mouth 4 (four) times daily -  before meals and at bedtime.   DULoxetine 60 MG capsule Commonly known as:  CYMBALTA Take 1 capsule (60 mg total) by mouth 2 (two) times daily.   escitalopram 20 MG tablet Commonly known as:  LEXAPRO Take 1 tablet (20 mg total) by mouth daily.   ESTRACE VAGINAL 0.1 MG/GM vaginal cream Generic drug:  estradiol Estrace  0.01% (0.1 mg/gram) vaginal cream  1 gm q hs x14 then 1/2 gm 2x/wk   FISH OIL PO Take 1 capsule by mouth daily.   fluticasone 50 MCG/ACT nasal spray Commonly known as:  FLONASE Place into both nostrils as needed.   LORazepam 0.5 MG tablet Commonly known as:  ATIVAN Take 1 tablet (0.5 mg total) by mouth every 12 (twelve) hours as needed for anxiety (for panic attack).   losartan-hydrochlorothiazide 50-12.5 MG tablet Commonly known as:  HYZAAR Take 1 tablet by mouth daily.   omeprazole 40 MG capsule Commonly known as:  PRILOSEC Take 1 capsule (40 mg total) by mouth 2 (two) times daily before a meal. 30 mins before breakfast and supper   oxybutynin 10 MG 24 hr tablet Commonly known as:  DITROPAN-XL Take 10 mg by mouth daily.   pravastatin 40 MG tablet Commonly known as:  PRAVACHOL Take 1 tablet (40 mg total) by mouth daily.   RECLAST IV Inject into the vein.   Toviaz 4 MG Tb24 tablet Generic drug:  fesoterodine Take 4 mg by mouth daily.   Toviaz 8 MG Tb24 tablet Generic drug:  fesoterodine Take 8 mg by mouth daily.   traMADol 50 MG tablet Commonly known as:  ULTRAM Take 1 tablet (50 mg total) by mouth 2 (two) times daily as needed for moderate pain. TAKES 1 AT BEDTIME AND 1 DURING THE DAY ONLY IF NEEDED   traZODone 100 MG tablet Commonly known as:  DESYREL Take 1 tablet (100 mg total) by mouth at bedtime.   Turmeric Curcumin 500 MG Caps Take 1,000 mg by mouth daily.   Vitamin B-12 1000 MCG Subl Place 1 tablet under the tongue daily.   WOMENS MULTIVITAMIN PLUS PO Take 1 tablet by mouth daily.   Zoster Vaccine Adjuvanted injection Commonly known as:  SHINGRIX Inject 0.5 mLs into the muscle once for 1 dose. Repeat dose in 2-6 months once.      Health maintenance:  Colonoscopy: > 65, no longer indicated.  PAP > 65. No  longer indicated.  Mammogram (50-74): Up-to-date 08/18/2018. Immunizations: Influenza vaccination up-to-date 2019, tetanus vaccination  up-to-date 2011, pneumonia vaccinations completed.  Shingrix offered and patient would like prescription. Infectious disease screening: Up-to-date with recommended infectious disease screenings. Glaucoma screen: UTD, Dr. Bing Plume 12/2018/  Hearing: Whisper test, no barriers identified.   Depression screen [facility] 2/9 01/19/2019 10/24/2018 04/19/2018 01/16/2018 10/19/2017  Decreased Interest 0 2 0 3 2  Down, Depressed, Hopeless 1 1 0 1 1  PHQ - 2 Score 1 3 0 4 3  Altered sleeping 0 3 1 3 2   Tired, decreased energy 3 3 1 3 2   Change in appetite 0 2 0 2 1  Feeling bad or failure about yourself  0 0 0 0 0  Trouble concentrating 0 1 0 0 0  Moving slowly or fidgety/restless 0 0 0 0 0  Suicidal thoughts 0 1 0 0 0  PHQ-9 Score 4 13 2 12 8   Difficult doing work/chores Not difficult at all Not difficult at all - Somewhat difficult Somewhat difficult   GAD 7 : Generalized Anxiety Score 01/19/2019 10/24/2018 04/19/2018 10/19/2017  Nervous, Anxious, on Edge 1 3 2 3   Control/stop worrying 1 3 1 2   Worry too much - different things 1 3 1 3   Trouble relaxing 1 2 1 3   Restless 0 0 1 0  Easily annoyed or irritable 0 0 0 0  Afraid - awful might happen 1 1 1 1   Total GAD 7 Score 5 12 7 12   Anxiety Difficulty Not difficult at all Not difficult at all - Somewhat difficult    Cognitive assessment:  Word recall (daisy, blue, church): 3/3  Immunizations: Immunization History  Administered Date(s) Administered  . Influenza Split 07/02/2011  . Influenza Whole 07/16/2008, 07/04/2009, 07/14/2010  . Influenza, High Dose Seasonal PF 07/04/2018  . Influenza,inj,Quad PF,6+ Mos 06/29/2013, 07/08/2014, 06/23/2017  . Influenza-Unspecified 07/02/2015  . Pneumococcal Conjugate-13 05/15/2014  . Pneumococcal Polysaccharide-23 10/04/2006, 09/11/2015  . Td 03/19/2010  . Zoster 07/25/2015    Exercise: does not get routine exercise.   Diet: Regular Functional Status Survey: Get up and go test: steady and less than 20  seconds. Is the patient deaf or have difficulty hearing?: No Does the patient have difficulty seeing, even when wearing glasses/contacts?: No Does the patient have difficulty concentrating, remembering, or making decisions?: No Does the patient have difficulty walking or climbing stairs?: Yes(Pt is having balance issues and feels she cannot walk straight line ) Does the patient have difficulty dressing or bathing?: No Does the patient have difficulty doing errands alone such as visiting a doctor's office or shopping?: No     Fall Risk  01/19/2019 04/19/2018 01/16/2018 05/25/2017 12/10/2016  Falls in the past year? 0 Yes Yes No Yes  Number falls in past yr: 0 1 1 - 1  Injury with Fall? 0 Yes No - No  Risk for fall due to : History of fall(s);Impaired balance/gait - - - -  Follow up Education provided;Falls evaluation completed;Falls prevention discussed Falls prevention discussed;Education provided Falls prevention discussed - Falls prevention discussed    Advanced Care Planning: Y Copy in chart 10/14/2016  Cardiovascular Screening Blood Tests Lipid Panel     Component Value Date/Time   CHOL 169 05/26/2018 0849   TRIG 186.0 (H) 05/26/2018 0849   HDL 44.90 05/26/2018 0849   CHOLHDL 4 05/26/2018 0849   VLDL 37.2 05/26/2018 0849   LDLCALC 87 05/26/2018 0849    Diabetes Screening Tests Lab Results  Component  Value Date   HGBA1C 5.5 05/26/2018    Assessment and Plan: Medicare annual wellness visit, subsequent Patient was encouraged to exercise greater than 150 minutes a week. Patient was encouraged to choose a diet filled with fresh fruits and vegetables, and lean meats. AVS provided to patient chart for education.  Shingrix vaccination printed and mailed to patient today. Mammogram and bone density ordered for the end of the year. Continue brain stimulating exercises. She has 3 medication concerns, will check on these today, believe prior authorization has already been initiated for  those medications Osteoporosis without current pathological fracture, unspecified osteoporosis type -to be scheduled with her mammogram at the end of the year. - DG Bone Density; Future Encounter for screening mammogram for breast cancer Should be scheduled at the same day as her bone density. - MM 3D SCREEN BREAST BILATERAL; Future Immunization due Shingrix prescription printed today and mailed to patient. Yeast dermatitis New problem. Suspect yeast dermatitis underneath her breasts.  Especially with history of worsening infection after using steroid cream. Clotrimazole cream prescribed for her today.  She is to monitor the condition and if not improving in 2 weeks or resolved in 4 weeks she will need to follow-up for further evaluation. Gait instability New problem.  Discussed options with her today including referral to neurology for current issue versus considering physical therapy evaluation for gait instability.  She states she would like to wait at this time, she seems to be concerned more of cost.  Patient's Medicare annual wellness visit was completed today along with greater than 15 minutes spent counseling, treating and evaluating new problems for patient.   Electronically Signed by: Howard Pouch, DO [place]

## 2019-01-22 ENCOUNTER — Encounter: Payer: Self-pay | Admitting: Family Medicine

## 2019-01-22 DIAGNOSIS — R2681 Unsteadiness on feet: Secondary | ICD-10-CM

## 2019-01-22 DIAGNOSIS — B372 Candidiasis of skin and nail: Secondary | ICD-10-CM | POA: Insufficient documentation

## 2019-01-22 HISTORY — DX: Unsteadiness on feet: R26.81

## 2019-01-22 NOTE — Patient Instructions (Signed)
Skin Yeast Infection  A skin yeast infection is a condition in which there is an overgrowth of yeast (candida) that normally lives on the skin. This condition usually occurs in areas of the skin that are constantly warm and moist, such as the armpits or the groin. What are the causes? This condition is caused by a change in the normal balance of the yeast and bacteria that live on the skin. What increases the risk? You are more likely to develop this condition if you:  Are obese.  Are pregnant.  Take birth control pills.  Have diabetes.  Take antibiotic medicines.  Take steroid medicines.  Are malnourished.  Have a weak body defense system (immune system).  Are 22 years of age or older.  Wear tight clothing. What are the signs or symptoms? The most common symptom of this condition is itchiness in the affected area. Other symptoms include:  Red, swollen area of the skin.  Bumps on the skin. How is this diagnosed?  This condition is diagnosed with a medical history and physical exam.  Your health care provider may check for yeast by taking light scrapings of the skin to be viewed under a microscope. How is this treated? This condition is treated with medicine. Medicines may be prescribed or be available over the counter. The medicines may be:  Taken by mouth (orally).  Applied as a cream or powder to your skin. Follow these instructions at home:   Take or apply over-the-counter and prescription medicines only as told by your health care provider.  Maintain a healthy weight. If you need help losing weight, talk with your health care provider.  Keep your skin clean and dry.  If you have diabetes, keep your blood sugar under control.  Keep all follow-up visits as told by your health care provider. This is important. Contact a health care provider if:  Your symptoms go away and then return.  Your symptoms do not get better with treatment.  Your symptoms get  worse.  Your rash spreads.  You have a fever or chills.  You have new symptoms.  You have new warmth or redness of your skin. Summary  A skin yeast infection is a condition in which there is an overgrowth of yeast (candida) that normally lives on the skin. This condition is caused by a change in the normal balance of the yeast and bacteria that live on the skin.  Take or apply over-the-counter and prescription medicines only as told by your health care provider.  Keep your skin clean and dry.  Contact a health care provider if your symptoms do not get better with treatment. This information is not intended to replace advice given to you by your health care provider. Make sure you discuss any questions you have with your health care provider. Document Released: 06/08/2011 Document Revised: 02/07/2018 Document Reviewed: 02/07/2018 Elsevier Interactive Patient Education  2019 [place] Maintenance After Age 72 After age 95, you are at a higher risk for certain long-term diseases and infections as well as injuries from falls. Falls are a major cause of broken bones and head injuries in people who are older than age 38. Getting regular preventive care can help to keep you healthy and well. Preventive care includes getting regular testing and making lifestyle changes as recommended by your health care provider. Talk with your health care provider about:  Which screenings and tests you should have. A screening is a test that checks for a  disease when you have no symptoms.  A diet and exercise plan that is right for you. What should I know about screenings and tests to prevent falls? Screening and testing are the best ways to find a health problem early. Early diagnosis and treatment give you the best chance of managing medical conditions that are common after age 32. Certain conditions and lifestyle choices may make you more likely to have a fall. Your health care provider may  recommend:  Regular vision checks. Poor vision and conditions such as cataracts can make you more likely to have a fall. If you wear glasses, make sure to get your prescription updated if your vision changes.  Medicine review. Work with your health care provider to regularly review all of the medicines you are taking, including over-the-counter medicines. Ask your health care provider about any side effects that may make you more likely to have a fall. Tell your health care provider if any medicines that you take make you feel dizzy or sleepy.  Osteoporosis screening. Osteoporosis is a condition that causes the bones to get weaker. This can make the bones weak and cause them to break more easily.  Blood pressure screening. Blood pressure changes and medicines to control blood pressure can make you feel dizzy.  Strength and balance checks. Your health care provider may recommend certain tests to check your strength and balance while standing, walking, or changing positions.  Foot health exam. Foot pain and numbness, as well as not wearing proper footwear, can make you more likely to have a fall.  Depression screening. You may be more likely to have a fall if you have a fear of falling, feel emotionally low, or feel unable to do activities that you used to do.  Alcohol use screening. Using too much alcohol can affect your balance and may make you more likely to have a fall. What actions can I take to lower my risk of falls? General instructions  Talk with your health care provider about your risks for falling. Tell your health care provider if: ? You fall. Be sure to tell your health care provider about all falls, even ones that seem minor. ? You feel dizzy, sleepy, or off-balance.  Take over-the-counter and prescription medicines only as told by your health care provider. These include any supplements.  Eat a healthy diet and maintain a healthy weight. A healthy diet includes low-fat dairy  products, low-fat (lean) meats, and fiber from whole grains, beans, and lots of fruits and vegetables. Home safety  Remove any tripping hazards, such as rugs, cords, and clutter.  Install safety equipment such as grab bars in bathrooms and safety rails on stairs.  Keep rooms and walkways well-lit. Activity   Follow a regular exercise program to stay fit. This will help you maintain your balance. Ask your health care provider what types of exercise are appropriate for you.  If you need a cane or walker, use it as recommended by your health care provider.  Wear supportive shoes that have nonskid soles. Lifestyle  Do not drink alcohol if your health care provider tells you not to drink.  If you drink alcohol, limit how much you have: ? 0-1 drink a day for women. ? 0-2 drinks a day for men.  Be aware of how much alcohol is in your drink. In the [place] one drink equals one typical bottle of beer (12 oz), one-half glass of wine (5 oz), or one shot of hard liquor (1 oz).  Do not use any products that contain nicotine or tobacco, such as cigarettes and e-cigarettes. If you need help quitting, ask your health care provider. Summary  Having a healthy lifestyle and getting preventive care can help to protect your health and wellness after age 14.  Screening and testing are the best way to find a health problem early and help you avoid having a fall. Early diagnosis and treatment give you the best chance for managing medical conditions that are more common for people who are older than age 86.  Falls are a major cause of broken bones and head injuries in people who are older than age 67. Take precautions to prevent a fall at home.  Work with your health care provider to learn what changes you can make to improve your health and wellness and to prevent falls. This information is not intended to replace advice given to you by your health care provider. Make sure you discuss any questions you  have with your health care provider. Document Released: 08/03/2017 Document Revised: 08/03/2017 Document Reviewed: 08/03/2017 Elsevier Interactive Patient Education  2019 [employer].

## 2019-01-22 NOTE — Telephone Encounter (Signed)
Pharmacy was called and all three medications were approved and sent out on 01/19/2019. Pt was notified and verbalized understanding

## 2019-01-22 NOTE — Telephone Encounter (Signed)
See prior note from Dr Raoul Pitch

## 2019-01-23 ENCOUNTER — Ambulatory Visit (INDEPENDENT_AMBULATORY_CARE_PROVIDER_SITE_OTHER): Payer: Medicare HMO | Admitting: Family Medicine

## 2019-01-23 ENCOUNTER — Other Ambulatory Visit: Payer: Self-pay

## 2019-01-23 ENCOUNTER — Encounter: Payer: Self-pay | Admitting: Family Medicine

## 2019-01-23 DIAGNOSIS — F418 Other specified anxiety disorders: Secondary | ICD-10-CM

## 2019-01-23 DIAGNOSIS — G4700 Insomnia, unspecified: Secondary | ICD-10-CM | POA: Diagnosis not present

## 2019-01-23 MED ORDER — ESCITALOPRAM OXALATE 20 MG PO TABS: 20.0000 mg | ORAL_TABLET | Freq: Every day | ORAL | 1 refills | Status: DC

## 2019-01-23 MED ORDER — DULOXETINE HCL 60 MG PO CPEP: 60.0000 mg | ORAL_CAPSULE | Freq: Two times a day (BID) | ORAL | 1 refills | Status: DC

## 2019-01-23 MED ORDER — TRAZODONE HCL 100 MG PO TABS: 100.0000 mg | ORAL_TABLET | Freq: Every day | ORAL | 1 refills | Status: DC

## 2019-01-23 NOTE — Patient Instructions (Signed)
Refilled anxiety medications today with the exception of Ativan has not needed refill at this time. Follow-up with her chronic medical conditions routinely every 6 months, next visit due end of July beginning of August.

## 2019-01-23 NOTE — Progress Notes (Signed)
VIRTUAL VISIT VIA VIDEO  I connected with [name] [name] on 01/23/19 at  8:40 AM EDT by a video enabled telemedicine application and verified that I am speaking with the correct person using two identifiers. Location patient: Home Location provider: [facility], Office Persons participating in the virtual visit: Patient, Dr. Raoul Pitch and R.Baker, LPN  I discussed the limitations of evaluation and management by telemedicine and the availability of in person appointments. The patient expressed understanding and agreed to proceed.   SUBJECTIVE Chief Complaint  Patient presents with  . Depression    No complaints or concerns. Medications are working well for her.   . Anxiety    HPI:  Anxiety/panic attack/insomnia [name] [name] is a 73 y.o. female for follow-up on her depression and anxiety/panic attacks.  She states she is doing much better after discontinuing the Zoloft and starting the Lexapro 20 mg daily.  She is also prescribed Cymbalta 60 mg twice daily and trazodone 100 mg nightly.  She uses Ativan 0.5 mg twice daily as needed. Prior note:  Patient reports compliance with Zoloft 200 mg daily, Cymbalta twice a day.  She does not feel her anxiety is as controlled as she would like. She has not been tried on other SSRI in the past. Cymbalta has been at maximum dose and likely chosen secondary to her lumbar/arthritis issues. Trazodone 100 mg QHS has been helpful and she sleeps better since addition.  She does not feel her family stays in touch with her as much as she would like. She feels like she is the one initiates contact with her family members and this is upsetting to her. She was offered referral to psychology, and she declined.  She does use the Ativan at least once a day sometimes twice.    ROS: See pertinent positives and negatives per HPI.  Patient Active Problem List   Diagnosis Date Noted  . Gait instability 01/22/2019  . Yeast dermatitis 01/22/2019  . Pain  management contract agreement 10/24/2018  . Long term prescription benzodiazepine use 10/24/2018  . Primary osteoarthritis of foot 10/24/2018  . Lumbar pain with radiation down right leg 10/24/2018  . DDD (degenerative disc disease), cervical 10/24/2018  . GERD (gastroesophageal reflux disease) 07/28/2018  . Dysphagia 07/28/2018  . Palpitations 04/20/2018  . Abnormal mammogram 08/17/2017  . CKD (chronic kidney disease) stage 3, GFR 30-59 ml/min (HCC) 04/13/2017  . Panic disorder 10/14/2016  . Seasonal allergies 02/19/2016  . Medicare annual wellness visit, subsequent 11/24/2015  . Morbid obesity ([place]) 09/11/2015  . Hoarseness 08/22/2015  . Insomnia 07/25/2015  . Primary osteoarthritis of left knee 03/19/2015  . Hypertriglyceridemia 07/31/2014  . Hemorrhoids, internal, with bleeding 06/26/2014  . Anemia due to other cause 04/16/2014  . Osteoporosis 02/06/2014  . Urinary incontinence 01/30/2014  . Osteoarthritis, hand 02/05/2013  . VENOUS INSUFFICIENCY 11/16/2010  . Vitamin D deficiency 07/20/2008  . Depression with anxiety 11/04/2007  . Essential hypertension 11/04/2007  . Irritable bowel syndrome 11/04/2007    Social History   Tobacco Use  . Smoking status: Former Smoker    Packs/day: 1.00    Years: 15.00    Pack years: 15.00    Types: Cigarettes    Start date: 12/06/1993    Last attempt to quit: 08/09/2009    Years since quitting: 9.4  . Smokeless tobacco: Never Used  . Tobacco comment: quit smoking 5 years ago  Substance Use Topics  . Alcohol use: Yes    Comment: ocassional  Current Outpatient Medications:  .  atenolol (TENORMIN) 25 MG tablet, Take 1 tablet (25 mg total) by mouth every morning., Disp: 90 tablet, Rfl: 1 .  calcium carbonate (OS-CAL) 600 MG TABS, Take 600 mg by mouth daily., Disp: , Rfl:  .  cetirizine (ZYRTEC) 10 MG tablet, Take 10 mg by mouth daily as needed for allergies., Disp: , Rfl:  .  cholecalciferol (VITAMIN D) 1000 UNITS tablet, Take 2  tablets (2,000 Units total) by mouth daily., Disp: 60 tablet, Rfl: 11 .  clotrimazole (LOTRIMIN) 1 % cream, Apply 1 application topically 2 (two) times daily. To affected area, Disp: 45 g, Rfl: 1 .  Cyanocobalamin (VITAMIN B-12) 1000 MCG SUBL, Place 1 tablet under the tongue daily. , Disp: , Rfl:  .  dicyclomine (BENTYL) 10 MG capsule, Take 1 capsule (10 mg total) by mouth 4 (four) times daily -  before meals and at bedtime., Disp: 120 capsule, Rfl: 0 .  DULoxetine (CYMBALTA) 60 MG capsule, Take 1 capsule (60 mg total) by mouth 2 (two) times daily., Disp: 180 capsule, Rfl: 1 .  escitalopram (LEXAPRO) 20 MG tablet, Take 1 tablet (20 mg total) by mouth daily., Disp: 90 tablet, Rfl: 1 .  estradiol (ESTRACE VAGINAL) 0.1 MG/GM vaginal cream, Estrace 0.01% (0.1 mg/gram) vaginal cream  1 gm q hs x14 then 1/2 gm 2x/wk, Disp: , Rfl:  .  fesoterodine (TOVIAZ) 4 MG TB24 tablet, Take 4 mg by mouth daily., Disp: , Rfl:  .  fluticasone (FLONASE) 50 MCG/ACT nasal spray, Place into both nostrils as needed. , Disp: , Rfl:  .  LORazepam (ATIVAN) 0.5 MG tablet, Take 1 tablet (0.5 mg total) by mouth every 12 (twelve) hours as needed for anxiety (for panic attack)., Disp: 60 tablet, Rfl: 5 .  losartan-hydrochlorothiazide (HYZAAR) 50-12.5 MG tablet, Take 1 tablet by mouth daily., Disp: 90 tablet, Rfl: 1 .  Multiple Vitamins-Minerals (WOMENS MULTIVITAMIN PLUS PO), Take 1 tablet by mouth daily., Disp: , Rfl:  .  Omega-3 Fatty Acids (FISH OIL PO), Take 1 capsule by mouth daily. , Disp: , Rfl:  .  omeprazole (PRILOSEC) 40 MG capsule, Take 1 capsule (40 mg total) by mouth 2 (two) times daily before a meal. 30 mins before breakfast and supper, Disp: 180 capsule, Rfl: 1 .  oxybutynin (DITROPAN-XL) 10 MG 24 hr tablet, Take 10 mg by mouth daily., Disp: , Rfl: 11 .  pravastatin (PRAVACHOL) 40 MG tablet, Take 1 tablet (40 mg total) by mouth daily., Disp: 90 tablet, Rfl: 3 .  TOVIAZ 8 MG TB24 tablet, Take 8 mg by mouth daily., Disp:  , Rfl:  .  traMADol (ULTRAM) 50 MG tablet, Take 1 tablet (50 mg total) by mouth 2 (two) times daily as needed for moderate pain. TAKES 1 AT BEDTIME AND 1 DURING THE DAY ONLY IF NEEDED, Disp: 60 tablet, Rfl: 5 .  traZODone (DESYREL) 100 MG tablet, Take 1 tablet (100 mg total) by mouth at bedtime., Disp: 90 tablet, Rfl: 1 .  Turmeric Curcumin 500 MG CAPS, Take 1,000 mg by mouth daily. , Disp: , Rfl:  .  Zoledronic Acid (RECLAST IV), Inject into the vein., Disp: , Rfl:   Allergies  Allergen Reactions  . Meloxicam Swelling    Caused edema and Cr increase  . Morphine Nausea And Vomiting    SEVERE  . Sulfa Antibiotics Swelling    OBJECTIVE: BP 124/86   Pulse 76   Ht 5\' 5"  (1.651 m)   Wt 194 lb 2  oz (88.1 kg)   BMI 32.30 kg/m  Gen: No acute distress. Nontoxic in appearance.  HENT: AT. [place].  MMM.  Eyes:Pupils Equal Round Reactive to light, Extraocular movements intact,  Conjunctiva without redness, discharge or icterus. Psych: Normal affect, dress and demeanor. Normal speech. Normal thought content and judgment. Depression screen [facility] 2/9 01/23/2019 01/19/2019 10/24/2018 04/19/2018 01/16/2018  Decreased Interest 1 0 2 0 3  Down, Depressed, Hopeless 0 1 1 0 1  PHQ - 2 Score 1 1 3  0 4  Altered sleeping 0 0 3 1 3   Tired, decreased energy 1 3 3 1 3   Change in appetite 0 0 2 0 2  Feeling bad or failure about yourself  0 0 0 0 0  Trouble concentrating 0 0 1 0 0  Moving slowly or fidgety/restless 0 0 0 0 0  Suicidal thoughts 0 0 1 0 0  PHQ-9 Score 2 4 13 2 12   Difficult doing work/chores Not difficult at all Not difficult at all Not difficult at all - Somewhat difficult  Some recent data might be hidden   GAD 7 : Generalized Anxiety Score 01/23/2019 01/19/2019 10/24/2018 04/19/2018  Nervous, Anxious, on Edge 1 1 3 2   Control/stop worrying 0 1 3 1   Worry too much - different things 3 1 3 1   Trouble relaxing 0 1 2 1   Restless 0 0 0 1  Easily annoyed or irritable 0 0 0 0  Afraid - awful might  happen 1 1 1 1   Total GAD 7 Score 5 5 12 7   Anxiety Difficulty Not difficult at all Not difficult at all Not difficult at all -      ASSESSMENT AND PLAN: [name] [name] is a 73 y.o. female present for  Depression with anxiety/insomnia/panic d/o/benzo use - Much improved.  - Continue lexapro 20 mg QD. - Continue Cymbalta 60 mg twice a day. - Continue trazodone 100 mg daily at bedtime. - Ativan refill not needed at this time. NCCS database reviewed and appropriate 10/24/18  - UDS and tramadol urine UTD - Controlled substance contract signed. - pt declined psychology referral.  - F/U 3 withher [facility] appt and then every 6 mos thereafter w/ [place] appts    [place] appt due July/August 2020  > 15 minutes spent with patient, >50% of time spent face to face counseling     Howard Pouch, DO 01/23/2019

## 2019-04-24 ENCOUNTER — Other Ambulatory Visit: Payer: Self-pay

## 2019-04-24 ENCOUNTER — Encounter: Payer: Self-pay | Admitting: Family Medicine

## 2019-04-24 ENCOUNTER — Ambulatory Visit (INDEPENDENT_AMBULATORY_CARE_PROVIDER_SITE_OTHER): Payer: Medicare HMO | Admitting: Family Medicine

## 2019-04-24 VITALS — BP 110/75 | HR 73 | Temp 98.3°F | Resp 16 | Ht 65.0 in | Wt 195.0 lb

## 2019-04-24 DIAGNOSIS — E781 Pure hyperglyceridemia: Secondary | ICD-10-CM

## 2019-04-24 DIAGNOSIS — E559 Vitamin D deficiency, unspecified: Secondary | ICD-10-CM

## 2019-04-24 DIAGNOSIS — I129 Hypertensive chronic kidney disease with stage 1 through stage 4 chronic kidney disease, or unspecified chronic kidney disease: Secondary | ICD-10-CM

## 2019-04-24 DIAGNOSIS — M19042 Primary osteoarthritis, left hand: Secondary | ICD-10-CM

## 2019-04-24 DIAGNOSIS — Z79899 Other long term (current) drug therapy: Secondary | ICD-10-CM | POA: Diagnosis not present

## 2019-04-24 DIAGNOSIS — N183 Chronic kidney disease, stage 3 unspecified: Secondary | ICD-10-CM

## 2019-04-24 DIAGNOSIS — M5030 Other cervical disc degeneration, unspecified cervical region: Secondary | ICD-10-CM

## 2019-04-24 DIAGNOSIS — M79604 Pain in right leg: Secondary | ICD-10-CM

## 2019-04-24 DIAGNOSIS — M1712 Unilateral primary osteoarthritis, left knee: Secondary | ICD-10-CM

## 2019-04-24 DIAGNOSIS — M19079 Primary osteoarthritis, unspecified ankle and foot: Secondary | ICD-10-CM

## 2019-04-24 DIAGNOSIS — G4700 Insomnia, unspecified: Secondary | ICD-10-CM

## 2019-04-24 DIAGNOSIS — M545 Low back pain: Secondary | ICD-10-CM | POA: Diagnosis not present

## 2019-04-24 DIAGNOSIS — F418 Other specified anxiety disorders: Secondary | ICD-10-CM | POA: Diagnosis not present

## 2019-04-24 DIAGNOSIS — M810 Age-related osteoporosis without current pathological fracture: Secondary | ICD-10-CM

## 2019-04-24 DIAGNOSIS — F410 Panic disorder [episodic paroxysmal anxiety] without agoraphobia: Secondary | ICD-10-CM

## 2019-04-24 DIAGNOSIS — Z0289 Encounter for other administrative examinations: Secondary | ICD-10-CM

## 2019-04-24 DIAGNOSIS — M19041 Primary osteoarthritis, right hand: Secondary | ICD-10-CM

## 2019-04-24 DIAGNOSIS — I10 Essential (primary) hypertension: Secondary | ICD-10-CM

## 2019-04-24 MED ORDER — ATENOLOL 25 MG PO TABS: 25.0000 mg | ORAL_TABLET | Freq: Every morning | ORAL | 1 refills | Status: DC

## 2019-04-24 MED ORDER — PRAVASTATIN SODIUM 40 MG PO TABS: 40.0000 mg | ORAL_TABLET | Freq: Every day | ORAL | 3 refills | Status: DC

## 2019-04-24 MED ORDER — OMEPRAZOLE 40 MG PO CPDR: 40.0000 mg | DELAYED_RELEASE_CAPSULE | Freq: Two times a day (BID) | ORAL | 1 refills | Status: DC

## 2019-04-24 MED ORDER — TRAZODONE HCL 100 MG PO TABS: 100.0000 mg | ORAL_TABLET | Freq: Every day | ORAL | 1 refills | Status: DC

## 2019-04-24 MED ORDER — ESCITALOPRAM OXALATE 20 MG PO TABS: 20.0000 mg | ORAL_TABLET | Freq: Every day | ORAL | 1 refills | Status: DC

## 2019-04-24 MED ORDER — LOSARTAN POTASSIUM-HCTZ 50-12.5 MG PO TABS: 1.0000 | ORAL_TABLET | Freq: Every day | ORAL | 1 refills | Status: DC

## 2019-04-24 MED ORDER — DULOXETINE HCL 60 MG PO CPEP: 60.0000 mg | ORAL_CAPSULE | Freq: Two times a day (BID) | ORAL | 1 refills | Status: DC

## 2019-04-24 MED ORDER — DICYCLOMINE HCL 10 MG PO CAPS: 10.0000 mg | ORAL_CAPSULE | Freq: Three times a day (TID) | ORAL | 0 refills | Status: DC

## 2019-04-24 MED ORDER — TRAMADOL HCL 50 MG PO TABS: 50.0000 mg | ORAL_TABLET | Freq: Two times a day (BID) | ORAL | 5 refills | Status: DC | PRN

## 2019-04-24 MED ORDER — LORAZEPAM 0.5 MG PO TABS: 0.5000 mg | ORAL_TABLET | Freq: Two times a day (BID) | ORAL | 5 refills | Status: DC | PRN

## 2019-04-24 NOTE — Patient Instructions (Signed)
It was great to see you today.  We will call you to schedule your physical at the beginning of the year with labs.  Your BP is great.

## 2019-04-24 NOTE — Progress Notes (Signed)
Pre visit review using our clinic review tool, if applicable. No additional management support is needed unless otherwise documented below in the visit note. 

## 2019-04-24 NOTE — Progress Notes (Signed)
[name] [name] , 04/03/1946, 73 y.o., female MRN: [id number] Patient Care Team    Relationship Specialty Notifications Start End  Ma Hillock, DO PCP - General Family Medicine  06/02/15   Gatha Mayer, MD Consulting Physician Gastroenterology  11/24/15   Calvert Cantor, MD Consulting Physician Ophthalmology  11/24/15   Specialists, [facility]  04/12/16   Pa, [facility]    01/16/18   Rheumatology, [facility]    01/16/18   Kathrynn Ducking, MD Consulting Physician Neurology  10/24/18     Chief Complaint  Patient presents with  . Hypertension  . Anxiety    states that she is doing well overall.  Can sometimes feel down about spending so much time alone.     Subjective:  Hypertension/hyperlipidemia/CKD3:  Pt reports compliance with Pravastatin 40 mg, losartan-HCTZ 50-12.5 mg daily mg and atenolol 25 mg. Patient denies chest pain, shortness of breath, dizziness or lower extremity edema.  She does endorse palpitations that occur mostly when she "has herself worked up" or anxious. Pt does not take daily baby ASA. Pt is  prescribed statin. BMP: 04/19/2018 GFR 53 CBC: 05/2018, WNL Lipids: 05/26/2018 elevated Tg TSH: 04/19/2018 5.86 H Diet: Low-sodium Exercise: Attempts RF: Hypertension, hyperlipidemia. Family history of heart disease, former smoker, obesity  Anxiety/panic attack/insomnia: Patient reports compliance with lexapro 20 mg daily, Cymbalta twice a day. She had been on zoloft and tapered off when lexapro started. Cymbalta has been at maximum dose and likely chosen secondary to her lumbar/arthritis issues. Trazodone 100 mg QHS has been helpful and she sleeps better since addition.  She is feeling some loneliness secondary to the coronavirus pandemic.  But overall feels she is doing okay. She does not feel her family stays in touch with her as much as she would like. She feels like she is the one initiates contact with her family  members and this is upsetting to her. She was offered referral to psychology, and she declined.  She does use the Ativan at least once a day sometimes twice.      Arthritis/pain/lumbar pain with radiation down right leg: Patient reports compliance with tramadol 50 mg twice a day when necessary for arthritic pain. She is in need of refills today.She routinely takes once daily. She complains of low back and right leg pain throughout the day as well. She has h/o anterior cervical decompression/discectomy in the past. Indication for chronic opioid: Moderate to severe arthritis and osteoporosis discomfort Medication and dose: Tramadol 50 mg twice daily as needed # pills per: #60 Last UDS date: today 10/24/18 Pain contract signed (Y/N): Yes Date narcotic database last reviewed (include red flags): 04/24/19    Depression screen [facility] 2/9 04/24/2019 01/23/2019 01/19/2019  Decreased Interest 0 1 0  Down, Depressed, Hopeless 0 0 1  PHQ - 2 Score 0 1 1  Altered sleeping 0 0 0  Tired, decreased energy 3 1 3   Change in appetite 3 0 0  Feeling bad or failure about yourself  0 0 0  Trouble concentrating 0 0 0  Moving slowly or fidgety/restless 0 0 0  Suicidal thoughts 0 0 0  PHQ-9 Score 6 2 4   Difficult doing work/chores - Not difficult at all Not difficult at all  Some recent data might be hidden   GAD 7 : Generalized Anxiety Score 04/24/2019 01/23/2019 01/19/2019 10/24/2018  Nervous, Anxious, on Edge 2 1 1 3   Control/stop worrying 2 0 1 3  Worry too much - different things 2 3 1 3   Trouble relaxing 0 0 1 2  Restless 0 0 0 0  Easily annoyed or irritable 0 0 0 0  Afraid - awful might happen 0 1 1 1   Total GAD 7 Score 6 5 5 12   Anxiety Difficulty - Not difficult at all Not difficult at all Not difficult at all    Allergies  Allergen Reactions  . Meloxicam Swelling    Caused edema and Cr increase  . Morphine Nausea And Vomiting    SEVERE  . Sulfa Antibiotics Swelling   Social History   Tobacco  Use  . Smoking status: Former Smoker    Packs/day: 1.00    Years: 15.00    Pack years: 15.00    Types: Cigarettes    Start date: 12/06/1993    Quit date: 08/09/2009    Years since quitting: 9.7  . Smokeless tobacco: Never Used  . Tobacco comment: quit smoking 5 years ago  Substance Use Topics  . Alcohol use: Yes    Comment: ocassional   Past Medical History:  Diagnosis Date  . Anxiety   . Arthritis    oa  . Chronic cystitis   . 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 DDD (degenerative disc disease), cervical   . Depression   . Fibromyalgia   . GERD (gastroesophageal reflux disease)   . Headache    sinus  . Heart murmur   . History of duodenal ulcer    2007  . History of gastritis    2007  . HTN (hypertension)   . Hyperlipidemia   . Internal and external hemorrhoids without complication   . Intrinsic (urethral) sphincter deficiency (ISD)   . Irritable bowel syndrome   . Osteopenia   . Pernicious anemia   . Pneumonia   . PONV (postoperative nausea and vomiting)   . Unspecified essential hypertension   . Unspecified venous (peripheral) insufficiency    wears compression hose  . Unspecified vitamin D deficiency   . Urine incontinence    Past Surgical History:  Procedure Laterality Date  . ANTERIOR AND POSTERIOR VAGINAL REPAIR  04-29-2004   AND TRANSVAGINAL TAPE SLING  . ANTERIOR CERVICAL DECOMP/DISCECTOMY FUSION  1999  . APPENDECTOMY  1984  . BILATERAL SALPINGOOPHORECTOMY  1984  . CARDIAC CATHETERIZATION  10-03-2002   DR DEGENT   NORMAL CORONARIES ARTERIES/  EF 55%  . CYSTOSCOPY N/A 08/27/2013   Procedure: CYSTOSCOPY;  Surgeon: Ailene Rud, MD;  Location[facility];  Service: Urology;  Laterality: N/A;  . CYSTOSCOPY WITH INJECTION N/A 12/17/2013   Procedure: Ascencion Dike WITH INJECTION;  Surgeon: Ailene Rud, MD;  Location: [facility];  Service: Urology;  Laterality: N/A;  . DILATION AND  CURETTAGE OF UTERUS    . EXCISION RIGHT NECK AND LEFT BREAST SEBACEOUS CYST  05-18-2002  . HEMORRHOID BANDING  2014  . PUBOVAGINAL SLING N/A 10/25/2016   Procedure: Gaynelle Arabian;  Surgeon: Carolan Clines, MD;  Location: [facility];  Service: Urology;  Laterality: N/A;  . right litlle finger surgery  yrs ago   cyst removed x 4  . TOTAL ABDOMINAL HYSTERECTOMY  1986  . TUBAL LIGATION    . VAGINAL PROLAPSE REPAIR N/A 08/27/2013   Procedure: ANTERIOR VAGINAL VAULT SUSPENSION, KELLY PLICATION WITH SACROSPINOUS LIGAMENT FIXATION AND XENFORM BOVINE DERMIS GRAFT AUGMENTATION, URETHRAL EXPLORATION, URETHROLYSIS, EXPLANTATION OF TVT TAPE, IMPLANTATION OF FLOSEAL, IMPLANTATION OF XENFORM BOVINE GRAFT  IN PERIURETHRAL SPACE;  Surgeon: Ailene Rud, MD;  Location[facility];  Service: Urology;  Laterality: N/A  . VAGINAL PROLAPSE REPAIR N/A 10/25/2016   Procedure: VAGINAL VAULT SUSPENSION with mesh and sacrospinous repair;  Surgeon: Carolan Clines, MD;  Location: [facility];  Service: Urology;  Laterality: N/A;   Family History  Problem Relation Age of Onset  . Lung cancer Father   . Coronary artery disease Father   . Hypertension Mother   . Heart disease Mother   . Myelodysplastic syndrome Mother   . Arthritis Mother   . Cervical cancer Mother   . Dementia Maternal Aunt   . Fibromyalgia Sister   . Hypertension Sister   . Anemia Sister   . Depression Sister   . Hypertension Brother   . Anemia Brother   . Hypertension Brother   . Hyperlipidemia Brother   . Colon polyps Other        aunt  . Heart disease Maternal Uncle   . Lung cancer Paternal Uncle   . Kidney disease Sister        kidney stones, removed kidney  . Dementia Maternal Aunt   . Lung cancer Maternal Uncle   . Other Paternal Uncle        brain Tumor  . Pancreatic cancer Cousin   . Colon cancer Neg Hx    Allergies as of 04/24/2019      Reactions   Meloxicam Swelling   Caused edema and Cr increase    Morphine Nausea And Vomiting   SEVERE   Sulfa Antibiotics Swelling      Medication List       Accurate as of April 24, 2019  1:08 PM. If you have any questions, ask your nurse or doctor.        atenolol 25 MG tablet Commonly known as: TENORMIN Take 1 tablet (25 mg total) by mouth every morning.   calcium carbonate 600 MG Tabs tablet Commonly known as: OS-CAL Take 600 mg by mouth daily.   cetirizine 10 MG tablet Commonly known as: ZYRTEC Take 10 mg by mouth daily as needed for allergies.   cholecalciferol 1000 units tablet Commonly known as: VITAMIN D Take 2 tablets (2,000 Units total) by mouth daily.   clotrimazole 1 % cream Commonly known as: LOTRIMIN Apply 1 application topically 2 (two) times daily. To affected area   dicyclomine 10 MG capsule Commonly known as: Bentyl Take 1 capsule (10 mg total) by mouth 4 (four) times daily -  before meals and at bedtime.   DULoxetine 60 MG capsule Commonly known as: CYMBALTA Take 1 capsule (60 mg total) by mouth 2 (two) times daily.   escitalopram 20 MG tablet Commonly known as: LEXAPRO Take 1 tablet (20 mg total) by mouth daily.   ESTRACE VAGINAL 0.1 MG/GM vaginal cream Generic drug: estradiol Estrace 0.01% (0.1 mg/gram) vaginal cream  1 gm q hs x14 then 1/2 gm 2x/wk   FISH OIL PO Take 1 capsule by mouth daily.   fluticasone 50 MCG/ACT nasal spray Commonly known as: FLONASE Place into both nostrils as needed.   LORazepam 0.5 MG tablet Commonly known as: ATIVAN Take 1 tablet (0.5 mg total) by mouth every 12 (twelve) hours as needed for anxiety (for panic attack).   losartan-hydrochlorothiazide 50-12.5 MG tablet Commonly known as: HYZAAR Take 1 tablet by mouth daily.   omeprazole 40 MG capsule Commonly known as: PRILOSEC Take 1 capsule (40 mg total) by mouth 2 (two) times daily  before a meal. 30 mins before breakfast and supper   oxybutynin 10 MG 24 hr tablet Commonly known as: DITROPAN-XL Take 10 mg by  mouth daily.   pravastatin 40 MG tablet Commonly known as: PRAVACHOL Take 1 tablet (40 mg total) by mouth daily.   RECLAST IV Inject into the vein.   Toviaz 4 MG Tb24 tablet Generic drug: fesoterodine Take 4 mg by mouth daily.   Toviaz 8 MG Tb24 tablet Generic drug: fesoterodine Take 8 mg by mouth daily.   traMADol 50 MG tablet Commonly known as: ULTRAM Take 1 tablet (50 mg total) by mouth 2 (two) times daily as needed for moderate pain. TAKES 1 AT BEDTIME AND 1 DURING THE DAY ONLY IF NEEDED   traZODone 100 MG tablet Commonly known as: DESYREL Take 1 tablet (100 mg total) by mouth at bedtime.   Turmeric Curcumin 500 MG Caps Take 1,000 mg by mouth daily.   Vitamin B-12 1000 MCG Subl Place 1 tablet under the tongue daily.   WOMENS MULTIVITAMIN PLUS PO Take 1 tablet by mouth daily.       No results found for this or any previous visit (from the past 24 hour(s)). No results found.   ROS: Negative, with the exception of above mentioned in HPI   Objective:  BP 110/75   Pulse 73   Temp 98.3 F (36.8 C)   Resp 16   Ht 5\' 5"  (1.651 m)   Wt 195 lb (88.5 kg)   SpO2 97%   BMI 32.45 kg/m  Body mass index is 32.45 kg/m. Gen: Afebrile. No acute distress.  Nontoxic in presentation, pleasant Caucasian obese female. HENT: AT. [place].  MMM.  Eyes:Pupils Equal Round Reactive to light, Extraocular movements intact,  Conjunctiva without redness, discharge or icterus. Neck/lymp/endocrine: Supple, no lymphadenopathy, no thyromegaly CV: RRR no murmur, no edema, +2/4 P posterior tibialis pulses Chest: CTAB, no wheeze or crackles Abd: Soft.  Obese. NTND. BS present.  No masses palpated.  Skin: No rashes, purpura or petechiae.  Neuro:  Normal gait. PERLA. EOMi. Alert. Oriented x3  Psych: Normal affect, dress and demeanor. Normal speech. Normal thought content and judgment..    Assessment/Plan: FARREN LANDA is a 73 y.o. female present for OV for  IZZABELL KLASEN is a 73 y.o.  female present for follow up  Depression with anxiety/insomnia/panic d/o/benzo use -Stable. -Continue Lexapro 20 mg daily- refills provided today - Continue Cymbalta 60 mg twice a day. - Continue trazodone 100 mg daily at bedtime. - Refill on Ativan provided for 6 months. NCCS database reviewed and appropriate 04/24/19  - UDS and tramadol urine UTD - Refills provided today on all the above medications. - Controlled substance contract signed. - pt declined psychology referral.  - F/U 6 months as long as doing well, sooner if needed.   Hypertension/morbid obesity/hyperlipidemia/CKD3/palpitations: -Stable.  Continue losartan-HCTZ, atenolol and statin. - HR 50 today- decrease atenolol to 12.5 mg daily. If palpitations worsen, can return to full dose as long as no symptoms occur.   - Continue low-sodium diet, exercise greater than 150 minutes a week. - Follow-up 6 months   Arthritis/pain/lumbar pain: -Better controlled on medications. -  tramadol refilled today for chronic arthritic pain. Encouraged her to take tramadol every 12 hours daily (was only routinely taking once).  - UDS and urine tramadol up-to-date. - contract up-to-date. - NCCS database reviewed 04/24/19 and appropriate.  - Follow-up in 6 months if needing prescription refilled.  F/U 6 months on  [facility]-CPE    electronically signed by:  Howard Pouch, DO  [place]

## 2019-04-27 DIAGNOSIS — F444 Conversion disorder with motor symptom or deficit: Secondary | ICD-10-CM | POA: Diagnosis not present

## 2019-04-27 DIAGNOSIS — J385 Laryngeal spasm: Secondary | ICD-10-CM | POA: Diagnosis not present

## 2019-06-21 DIAGNOSIS — M150 Primary generalized (osteo)arthritis: Secondary | ICD-10-CM | POA: Diagnosis not present

## 2019-06-21 DIAGNOSIS — M2550 Pain in unspecified joint: Secondary | ICD-10-CM | POA: Diagnosis not present

## 2019-06-21 DIAGNOSIS — N3020 Other chronic cystitis without hematuria: Secondary | ICD-10-CM | POA: Diagnosis not present

## 2019-06-21 DIAGNOSIS — M810 Age-related osteoporosis without current pathological fracture: Secondary | ICD-10-CM | POA: Diagnosis not present

## 2019-06-21 DIAGNOSIS — N3946 Mixed incontinence: Secondary | ICD-10-CM | POA: Diagnosis not present

## 2019-06-21 DIAGNOSIS — R350 Frequency of micturition: Secondary | ICD-10-CM | POA: Diagnosis not present

## 2019-06-21 DIAGNOSIS — M797 Fibromyalgia: Secondary | ICD-10-CM | POA: Diagnosis not present

## 2019-06-26 ENCOUNTER — Telehealth: Payer: Self-pay

## 2019-06-26 ENCOUNTER — Other Ambulatory Visit: Payer: Self-pay | Admitting: Family Medicine

## 2019-06-26 DIAGNOSIS — G4700 Insomnia, unspecified: Secondary | ICD-10-CM

## 2019-06-26 NOTE — Telephone Encounter (Signed)
Pt called back and said that the Tramadol, Bentyl, and Lexapro were not being sent to her automatically and not covered by insurance. Pt had not spoken with pharmacy yet. Pt was advised to call pharmacy because pharmacy was called in April and they had sent the medication to patient and all these were covered medications. Pt was going to call pharmacy.  Pt was chart updated- Flu vaccine given at [facility] last week- Immunization hx updated

## 2019-06-26 NOTE — Telephone Encounter (Signed)
Pt left VM on nurses VM stating that some of her medications were no longer going to be covered per [facility] Pt was called back and VM was left for patient to return call

## 2019-06-27 NOTE — Telephone Encounter (Signed)
[facility] pharmacy was called and after the rep investigated they received the fax but the 5 refills was not placed into their system due to "note to pharmacy" that says NO refills. Pharmacist said he would refill #60 with 3 refills and take out the no refills. Verbal order given.   Pt was called and given information and told to F/U in 10/2018 for CPE, she verbalized understanding

## 2019-06-27 NOTE — Telephone Encounter (Signed)
Please call patient. I have received refill request for pts trazodone and zoloft. - zoloft was discontinued >> please make pharmacy aware - trazodone was refilled at her July appt with 6 mos of scripts. >> please make sure correct script # is being used and the pharmacy has this medication for her.  Thanks!

## 2019-07-18 DIAGNOSIS — R490 Dysphonia: Secondary | ICD-10-CM | POA: Diagnosis not present

## 2019-07-18 DIAGNOSIS — J385 Laryngeal spasm: Secondary | ICD-10-CM | POA: Diagnosis not present

## 2019-07-18 DIAGNOSIS — J383 Other diseases of vocal cords: Secondary | ICD-10-CM | POA: Diagnosis not present

## 2019-07-27 DIAGNOSIS — J385 Laryngeal spasm: Secondary | ICD-10-CM | POA: Diagnosis not present

## 2019-07-27 DIAGNOSIS — R490 Dysphonia: Secondary | ICD-10-CM | POA: Diagnosis not present

## 2019-07-27 DIAGNOSIS — J383 Other diseases of vocal cords: Secondary | ICD-10-CM | POA: Diagnosis not present

## 2019-08-01 ENCOUNTER — Other Ambulatory Visit: Payer: Self-pay | Admitting: Family Medicine

## 2019-08-02 DIAGNOSIS — R350 Frequency of micturition: Secondary | ICD-10-CM | POA: Diagnosis not present

## 2019-08-07 DIAGNOSIS — N3941 Urge incontinence: Secondary | ICD-10-CM | POA: Diagnosis not present

## 2019-08-07 DIAGNOSIS — R350 Frequency of micturition: Secondary | ICD-10-CM | POA: Diagnosis not present

## 2019-08-20 ENCOUNTER — Other Ambulatory Visit: Payer: Self-pay

## 2019-08-20 DIAGNOSIS — E781 Pure hyperglyceridemia: Secondary | ICD-10-CM

## 2019-08-20 MED ORDER — PRAVASTATIN SODIUM 40 MG PO TABS: 40.0000 mg | ORAL_TABLET | Freq: Every day | ORAL | 0 refills | Status: DC

## 2019-08-20 NOTE — Progress Notes (Signed)
Pt called and LM on nurses VM stating she needed a short supply of Prevastatin sent to local pharmacy because [facility] was not sending RX. [employer] called for patient and they refilled medication and it will take 7-10 days to get to patient. Pt called and short supply was sent to local pharmacy

## 2019-08-21 ENCOUNTER — Other Ambulatory Visit: Payer: Self-pay

## 2019-08-21 ENCOUNTER — Ambulatory Visit
Admission: RE | Admit: 2019-08-21 | Discharge: 2019-08-21 | Disposition: A | Discharge status: Home / Self Care | Payer: Medicare HMO | Source: Ambulatory Visit | Attending: Family Medicine | Admitting: Family Medicine

## 2019-08-21 DIAGNOSIS — Z1231 Encounter for screening mammogram for malignant neoplasm of breast: Secondary | ICD-10-CM

## 2019-08-21 DIAGNOSIS — Z780 Asymptomatic menopausal state: Secondary | ICD-10-CM | POA: Diagnosis not present

## 2019-08-21 DIAGNOSIS — M8589 Other specified disorders of bone density and structure, multiple sites: Secondary | ICD-10-CM | POA: Diagnosis not present

## 2019-08-21 DIAGNOSIS — M810 Age-related osteoporosis without current pathological fracture: Secondary | ICD-10-CM

## 2019-08-22 ENCOUNTER — Telehealth: Payer: Self-pay | Admitting: Family Medicine

## 2019-08-22 NOTE — Telephone Encounter (Signed)
Pt was called and message was left to return call on VM  

## 2019-08-22 NOTE — Telephone Encounter (Signed)
Please inform patient the following information: Her mammogram requires additional views of her left breast. There was a distorted area they did not get a good view of and they need get another image to make sure they do not miss anything. They should be calling her to get her scheduled for the additional view.  Her bone density is a little improved from prior.  So the treatments she has received are working well.

## 2019-08-22 NOTE — Telephone Encounter (Signed)
Pt was called and given information, she verbalizes understanding  

## 2019-08-23 ENCOUNTER — Other Ambulatory Visit: Payer: Self-pay | Admitting: Family Medicine

## 2019-08-23 DIAGNOSIS — R928 Other abnormal and inconclusive findings on diagnostic imaging of breast: Secondary | ICD-10-CM

## 2019-08-23 NOTE — Telephone Encounter (Signed)
Returned and signed

## 2019-08-23 NOTE — Telephone Encounter (Signed)
Received fax from [facility] with orders for patient to have Diagnostic mammogram, [place], and guided biopsy of L and R breast. Orders placed on Dr Lucita Lora desk to review and sign.

## 2019-08-24 DIAGNOSIS — J385 Laryngeal spasm: Secondary | ICD-10-CM | POA: Diagnosis not present

## 2019-08-24 DIAGNOSIS — R490 Dysphonia: Secondary | ICD-10-CM | POA: Diagnosis not present

## 2019-08-24 DIAGNOSIS — J383 Other diseases of vocal cords: Secondary | ICD-10-CM | POA: Diagnosis not present

## 2019-08-27 ENCOUNTER — Ambulatory Visit
Admission: RE | Admit: 2019-08-27 | Discharge: 2019-08-27 | Disposition: A | Discharge status: Home / Self Care | Payer: Medicare HMO | Source: Ambulatory Visit | Attending: Family Medicine | Admitting: Family Medicine

## 2019-08-27 ENCOUNTER — Other Ambulatory Visit: Payer: Self-pay | Admitting: Family Medicine

## 2019-08-27 ENCOUNTER — Other Ambulatory Visit: Payer: Self-pay

## 2019-08-27 DIAGNOSIS — R928 Other abnormal and inconclusive findings on diagnostic imaging of breast: Secondary | ICD-10-CM

## 2019-08-27 DIAGNOSIS — R922 Inconclusive mammogram: Secondary | ICD-10-CM | POA: Diagnosis not present

## 2019-08-27 DIAGNOSIS — N6489 Other specified disorders of breast: Secondary | ICD-10-CM | POA: Diagnosis not present

## 2019-09-05 ENCOUNTER — Ambulatory Visit
Admission: RE | Admit: 2019-09-05 | Discharge: 2019-09-05 | Disposition: A | Discharge status: Home / Self Care | Payer: Medicare HMO | Source: Ambulatory Visit | Attending: Family Medicine | Admitting: Family Medicine

## 2019-09-05 ENCOUNTER — Other Ambulatory Visit: Payer: Self-pay

## 2019-09-05 DIAGNOSIS — D242 Benign neoplasm of left breast: Secondary | ICD-10-CM | POA: Diagnosis not present

## 2019-09-05 DIAGNOSIS — N6489 Other specified disorders of breast: Secondary | ICD-10-CM | POA: Diagnosis not present

## 2019-09-05 DIAGNOSIS — R928 Other abnormal and inconclusive findings on diagnostic imaging of breast: Secondary | ICD-10-CM

## 2019-09-10 ENCOUNTER — Other Ambulatory Visit: Payer: Self-pay | Admitting: Family Medicine

## 2019-09-12 ENCOUNTER — Telehealth: Payer: Self-pay

## 2019-09-12 NOTE — Telephone Encounter (Signed)
Faxed prior auth sent from [place] for patients Dicyclomine HCL 10mg  capsules. Prior auth complete via cover my meds and awaiting approval/Denial

## 2019-09-17 NOTE — Telephone Encounter (Signed)
[name] [name] (KeyNinfa Meeker) - [id number] Dicyclomine HCl 10MG  capsules Status: PA Response - Approved

## 2019-09-21 ENCOUNTER — Ambulatory Visit: Payer: Self-pay | Admitting: Surgery

## 2019-09-21 DIAGNOSIS — D242 Benign neoplasm of left breast: Secondary | ICD-10-CM | POA: Diagnosis not present

## 2019-09-21 NOTE — H&P (Signed)
[name] [name] Documented: 09/21/2019 9:14 AM Location: [facility] Patient #: [id number] DOB: [birth year] Divorced / Language: [name] [name] / Race: White Female   History of Present Illness [name] Moores A. Otila Starn MD; 09/21/2019 9:46 AM) Patient words: Patient sent at the request of Dr. Jimmye Norman of the [facility] due to abnormal screening mammogram. The patient had an area of abnormal density in her left breast upper outer quadrant. 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 She has no family history of breast cancer. She denies any history of breast pain, breast lump or nipple discharge.           CLINICAL DATA: The patient was called back for a possible mass with distortion in the left breast. EXAM: DIGITAL DIAGNOSTIC LEFT MAMMOGRAM WITH TOMO ULTRASOUND LEFT BREAST COMPARISON: Previous exam(s). ACR Breast Density Category c: The breast tissue is heterogeneously dense, which may obscure small masses. FINDINGS: There appear to be obscured masses in the lateral left breast. In the region of the obscured masses, possible distortion remains as seen on the whole paddle cc tomo view, image 44. On physical exam, no suspicious lumps are identified. Targeted ultrasound is performed, showing fibrocystic changes and dilated milk ducts. No suspicious masses. No axillary adenopathy. IMPRESSION: Possible distortion remains in the left breast, best seen on the CC view, tomographic image 44. Fibrocystic changes and dilated ducts are seen as well. No axillary adenopathy. RECOMMENDATION: Stereotactic biopsy of the possible left breast distortion. I have discussed the findings and recommendations with the patient. If applicable, a reminder letter will be sent to the patient regarding the next appointment. BI-RADS CATEGORY 4: Suspicious. Electronically Signed By: Dorise Bullion III M.D On: 08/27/2019 15:17  Result History  MM DIAG BREAST TOMO UNI LEFT (Order  [id number]) on 08/27/2019 - Order Result History Report <epic://OPTION/?LINKID&39>  US BREAST LTD UNI LEFT INC AXILLA (Order [id number]) - Reflex for Order [id number] Study Result  CLINICAL DATA: The patient was called back for a possible mass with distortion in the left breast. EXAM: DIGITAL DIAGNOSTIC LEFT MAMMOGRAM WITH TOMO ULTRASOUND LEFT BREAST COMPARISON: Previous exam(s). ACR Breast Density Category c: The breast tissue is heterogeneously dense, which may obscure small masses. FINDINGS: There appear to be obscured masses in the lateral left breast. In the region of the obscured masses, possible distortion remains as seen on the whole paddle cc tomo view, image 44. On physical exam, no suspicious lumps are identified. Targeted ultrasound is performed, showing fibrocystic changes and dilated milk ducts. No suspicious masses. No axillary adenopathy. IMPRESSION: Possible distortion remains in the left breast, best seen on the CC view, tomographic image 44. Fibrocystic changes and dilated ducts are seen as well. No axillary adenopathy. RECOMMENDATION: Stereotactic biopsy of the possible left breast distortion. I have discussed the findings and recommendations with the patient. If applicable, a reminder letter will be sent to the patient regarding the next appointment. BI-RADS CATEGORY 4: Suspicious. Electronically Signed By: Dorise Bullion III M.D On: 08/27/2019 15:17  Result History  US BREAST LTD UNI LEFT Bruce Donath (Order [id number]) on 08/27/2019 - Order Result History Report <epic://OPTION/?LINKID&40>  MM 3D SCREEN BREAST BILATERAL (Order [id number]) Study Result  CLINICAL DATA: Screening. EXAM: DIGITAL SCREENING BILATERAL MAMMOGRAM WITH TOMO AND CAD COMPARISON: Previous exam(s). ACR Breast Density Category c: The breast tissue is heterogeneously dense, which may obscure small masses. FINDINGS: In the left breast, a possible mass with distortion warrants  further evaluation. This possible mass with architectural distortion is seen within the slightly outer LEFT  breast, anterior depth, CC view only, slice 39. In the right breast, no findings suspicious for malignancy. Images were processed with CAD. IMPRESSION: Further evaluation is suggested for possible mass with distortion in the left breast. RECOMMENDATION: Diagnostic mammogram and possibly ultrasound of the left breast. (Code:[id number]) The patient will be contacted regarding the findings, and additional imaging will be scheduled. BI-RADS CATEGORY 0: Incomplete. Need additional imaging evaluation and/or prior mammograms for comparison. Electronically Signed By: Franki Cabot M.D. On: 08/22/2019 08:44  Result History  MM 3D SCREEN BREAST BILATERAL (Order [id number]) on 08/22/2019 - Order Result History Report <epic://OPTION/?LINKID&41> MyChart Results Release  MyChart Status: Active Results Release <epic://OPTION/?LINKID&42> Encounter-Level Documents - 08/27/2019:  Electronic signature on 08/27/2019 2:12 PM - E-signed <epic://OPTION/?LINKID&43> Electronic signature on 08/27/2019 2:12 PM - E-signed <epic://OPTION/?LINKID&44> Scan on 08/23/2019 2:56 PM by Default, Provider, MD <epic://OPTION/?LINKID&45>Scan on 08/23/2019 2:56 PM by Default, Provider, MD   Order-Level Documents:  There are no order-level documents. Vitals  Height Weight BMI (Calculated)  Assessment/Recommendation  Assessment Side Recommendation Suspicious [4] Left Biopsy Imaging  Imaging Information <epic://OPTION/?LINKID&46> Resulted by:  Signed Date/Time Phone Pager Mee Hives, [place] 08/27/2019 3:17 PM [phone] Original Order  Ordered On Ordered By 08/23/2019 9:47 AM <epic://OPTION/?LINKID&47> Donette Larry   MM DIAG BREAST TOMO UNI LEFT [id number] (Abnormal) Resulted: 08/27/19 1517, Result status: Final result Ordering provider: Howard Pouch A, DO 08/27/19 1413 Resulted by:  York Ram, MD Performed: 08/27/19 1417 - 08/27/19 1419 Accession number: [id number] Resulting lab: [facility] Narrative: CLINICAL DATA: The patient was called back for a possible mass with distortion in the left breast. EXAM: DIGITAL DIAGNOSTIC LEFT MAMMOGRAM WITH TOMO ULTRASOUND LEFT BREAST COMPARISON: Previous exam(s). ACR Breast Density Category c: The breast tissue is heterogeneously dense, which may obscure small masses. FINDINGS: There appear to be obscured masses in the lateral left breast. In the region of the obscured masses, possible distortion remains as seen on the whole paddle cc tomo view, image 44. On physical exam, no suspicious lumps are identified. Targeted ultrasound is performed, showing fibrocystic changes and dilated milk ducts. No suspicious masses. No axillary adenopathy. IMPRESSION: Possible distortion remains in the left breast, best seen on the CC view, tomographic image 44. Fibrocystic changes and dilated ducts are seen as well. No axillary adenopathy. RECOMMENDATION: Stereotactic biopsy of the possible left breast distortion. I have discussed the findings and recommendations with the patient. If applicable, a reminder letter will be sent to the patient regarding the next appointment. BI-RADS CATEGORY 4: Suspicious. Electronically Signed By: Dorise Bullion III M.D On: 08/27/2019 15:17            Diagnosis Breast, left, needle core biopsy, outer - INTRADUCTAL PAPILLOMA. - FIBROCYSTIC CHANGE AND SCLEROSING ADENOSIS. - NO MALIGNANCY IDENTIFIED.  The patient is a 73 year old female.   Past Surgical History (Tanisha A. Owens Shark, [place]; 09/21/2019 9:14 AM) Colon Polyp Removal - Colonoscopy  Gallbladder Surgery - Laparoscopic  Hysterectomy (not due to cancer) - Complete  Oral Surgery  Spinal Surgery - Neck   Diagnostic Studies History (Tanisha A. Owens Shark, [place]; 09/21/2019 9:14 AM) Colonoscopy   1-5 years ago Mammogram  within last year Pap Smear  >5 years ago  Allergies (Tanisha A. Owens Shark, [place]; 09/21/2019 9:15 AM) Sulfa Antibiotics  Morphine Derivatives  Allergies Reconciled   Medication History (Tanisha A. Owens Shark, [place]; 09/21/2019 9:16 AM) LORazepam (0.5MG  Tablet, Oral) Active. traMADol HCl (50MG  Tablet, Oral) Active. Atenolol (25MG  Tablet, Oral) Active. Dicyclomine HCl (10MG  Capsule, Oral) Active. DULoxetine HCl (  60MG  Capsule DR Part, Oral) Active. Escitalopram Oxalate (20MG  Tablet, Oral) Active. Losartan Potassium-HCTZ (50-12.5MG  Tablet, Oral) Active. Toviaz (8MG  Tablet ER 24HR, Oral) Active. traZODone HCl (100MG  Tablet, Oral) Active. Medications Reconciled  Social History (Tanisha A. Owens Shark, [place]; 09/21/2019 9:14 AM) Alcohol use  Occasional alcohol use. Caffeine use  Carbonated beverages, Coffee, Tea. No drug use  Tobacco use  Current every day smoker.  Family History (Tanisha A. Owens Shark, [place]; 09/21/2019 9:14 AM) Alcohol Abuse  Father. Arthritis  Brother, Father, Mother, Sister. Bleeding disorder  Brother, Mother, Sister. Cancer  Father. Cervical Cancer  Mother. Depression  Sister. Heart disease in female family member before age 85  Hypertension  Brother, Daughter, Father, Mother, Sister. Migraine Headache  Sister. Respiratory Condition  Father. Thyroid problems  Mother.  Pregnancy / Birth History (Tanisha A. Owens Shark, RMA; 09/21/2019 9:14 AM) Age at menarche  74 years. Age of menopause  20-50 Gravida  2 Maternal age  63-25 Para  1  Other Problems (Tanisha A. Owens Shark, [place]; 09/21/2019 9:14 AM) Anxiety Disorder  Arthritis  Bladder Problems  Depression  Gastroesophageal Reflux Disease  Hemorrhoids  High blood pressure  Hypercholesterolemia  Lump In Breast  Oophorectomy     Review of Systems (Tanisha A. Brown RMA; 09/21/2019 9:14 AM) General Present- Night Sweats. Not Present- Appetite Loss, Chills, Fatigue,  Fever, Weight Gain and Weight Loss. Skin Present- Dryness. Not Present- Change in Wart/Mole, Hives, Jaundice, New Lesions, Non-Healing Wounds, Rash and Ulcer. HEENT Present- Hoarseness, Sinus Pain and Wears glasses/contact lenses. Not Present- Earache, Hearing Loss, Nose Bleed, Oral Ulcers, Ringing in the Ears, Seasonal Allergies, Sore Throat, Visual Disturbances and Yellow Eyes. Breast Present- Breast Mass. Not Present- Breast Pain, Nipple Discharge and Skin Changes. Cardiovascular Present- Leg Cramps. Not Present- Chest Pain, Difficulty Breathing Lying Down, Palpitations, Rapid Heart Rate, Shortness of Breath and Swelling of Extremities. Gastrointestinal Present- Constipation, Excessive gas and Hemorrhoids. Not Present- Abdominal Pain, Bloating, Bloody Stool, Change in Bowel Habits, Chronic diarrhea, Difficulty Swallowing, Gets full quickly at meals, Indigestion, Nausea, Rectal Pain and Vomiting. Female Genitourinary Present- Urgency. Not Present- Frequency, Nocturia, Painful Urination and Pelvic Pain. Musculoskeletal Present- Joint Pain and Joint Stiffness. Not Present- Back Pain, Muscle Pain, Muscle Weakness and Swelling of Extremities. Neurological Present- Numbness and Tingling. Not Present- Decreased Memory, Fainting, Headaches, Seizures, Tremor, Trouble walking and Weakness. Psychiatric Present- Anxiety and Depression. Not Present- Bipolar, Change in Sleep Pattern, Fearful and Frequent crying. Endocrine Not Present- Cold Intolerance, Excessive Hunger, Hair Changes, Heat Intolerance, Hot flashes and New Diabetes. Hematology Not Present- Blood Thinners, Easy Bruising, Excessive bleeding, Gland problems, HIV and Persistent Infections.  Vitals (Tanisha A. Brown RMA; 09/21/2019 9:15 AM) 09/21/2019 9:15 AM Weight: 195.4 lb Height: 65in Body Surface Area: 1.96 m Body Mass Index: 32.52 kg/m  Temp.: 98.40F  Pulse: 98 (Regular)  BP: 124/76 (Sitting, Left Arm,  Standard)       Physical Exam (Amaura Authier A. Shondra Capps MD; 09/21/2019 9:47 AM) General Mental Status-Alert. General Appearance-Consistent with stated age. Hydration-Well hydrated. Voice-Normal.  Eye Eyeball - Bilateral-Extraocular movements intact. Sclera/Conjunctiva - Bilateral-No scleral icterus.  Chest and Lung Exam Note: Work of breathing normal   Breast Note: Mild bruising left breast upper outer quadrant with small hematoma. No other masses. Right breast is normal.   Cardiovascular Note: Normal sinus rhythm   Neurologic Neurologic evaluation reveals -alert and oriented x 3 with no impairment of recent or remote memory. Mental Status-Normal.  Musculoskeletal Normal Exam - Left-Upper Extremity Strength Normal and Lower Extremity Strength Normal. Normal  Exam - Right-Upper Extremity Strength Normal and Lower Extremity Strength Normal.  Lymphatic Head & Neck  General Head & Neck Lymphatics: Bilateral - Description - Normal. Axillary  General Axillary Region: Bilateral - Description - Normal. Tenderness - Non Tender.    Assessment & Plan (Silus Lanzo A. Jinny Sweetland MD; 09/21/2019 9:48 AM) PAPILLOMA OF LEFT BREAST (D24.2) Impression: Recommend left breast lumpectomy due to potential upgrade risk of 20% to malignant. Observation other option with close follow-up. Patient opted for left breast lumpectomy was seed localization. Risk of lumpectomy include bleeding, infection, seroma, more surgery, use of seed/wire, wound care, cosmetic deformity and the need for other treatments, death , blood clots, death. Pt agrees to proceed. Current Plans You are being scheduled for surgery- Our schedulers will call you.  You should hear from our office's scheduling department within 5 working days about the location, date, and time of surgery. We try to make accommodations for patient's preferences in scheduling surgery, but sometimes the OR schedule or the surgeon's  schedule prevents [place] from making those accommodations.  If you have not heard from our office [phone]) in 5 working days, call the office and ask for your surgeon's nurse.  If you have other questions about your diagnosis, plan, or surgery, call the office and ask for your surgeon's nurse.  Pt Education - Pamphlet Given - Breast Biopsy: discussed with patient and provided information.   Signed electronically by Turner Daniels, MD (09/21/2019 9:49 AM)

## 2019-09-25 ENCOUNTER — Other Ambulatory Visit: Payer: Self-pay | Admitting: Surgery

## 2019-09-25 DIAGNOSIS — D242 Benign neoplasm of left breast: Secondary | ICD-10-CM

## 2019-10-10 ENCOUNTER — Encounter: Payer: Self-pay | Admitting: Family Medicine

## 2019-10-10 ENCOUNTER — Ambulatory Visit (INDEPENDENT_AMBULATORY_CARE_PROVIDER_SITE_OTHER): Payer: Medicare HMO | Admitting: Family Medicine

## 2019-10-10 ENCOUNTER — Other Ambulatory Visit: Payer: Self-pay

## 2019-10-10 VITALS — BP 116/77 | HR 90 | Temp 97.2°F | Resp 16 | Ht 65.5 in | Wt 197.0 lb

## 2019-10-10 DIAGNOSIS — E781 Pure hyperglyceridemia: Secondary | ICD-10-CM

## 2019-10-10 DIAGNOSIS — M1712 Unilateral primary osteoarthritis, left knee: Secondary | ICD-10-CM

## 2019-10-10 DIAGNOSIS — M5030 Other cervical disc degeneration, unspecified cervical region: Secondary | ICD-10-CM | POA: Diagnosis not present

## 2019-10-10 DIAGNOSIS — M810 Age-related osteoporosis without current pathological fracture: Secondary | ICD-10-CM

## 2019-10-10 DIAGNOSIS — G4700 Insomnia, unspecified: Secondary | ICD-10-CM

## 2019-10-10 DIAGNOSIS — E669 Obesity, unspecified: Secondary | ICD-10-CM | POA: Diagnosis not present

## 2019-10-10 DIAGNOSIS — K219 Gastro-esophageal reflux disease without esophagitis: Secondary | ICD-10-CM

## 2019-10-10 DIAGNOSIS — F418 Other specified anxiety disorders: Secondary | ICD-10-CM

## 2019-10-10 DIAGNOSIS — M19042 Primary osteoarthritis, left hand: Secondary | ICD-10-CM

## 2019-10-10 DIAGNOSIS — N183 Chronic kidney disease, stage 3 unspecified: Secondary | ICD-10-CM | POA: Diagnosis not present

## 2019-10-10 DIAGNOSIS — M19079 Primary osteoarthritis, unspecified ankle and foot: Secondary | ICD-10-CM | POA: Diagnosis not present

## 2019-10-10 DIAGNOSIS — I10 Essential (primary) hypertension: Secondary | ICD-10-CM

## 2019-10-10 DIAGNOSIS — M545 Low back pain, unspecified: Secondary | ICD-10-CM

## 2019-10-10 DIAGNOSIS — E559 Vitamin D deficiency, unspecified: Secondary | ICD-10-CM | POA: Diagnosis not present

## 2019-10-10 DIAGNOSIS — F410 Panic disorder [episodic paroxysmal anxiety] without agoraphobia: Secondary | ICD-10-CM

## 2019-10-10 DIAGNOSIS — Z0289 Encounter for other administrative examinations: Secondary | ICD-10-CM

## 2019-10-10 DIAGNOSIS — G8929 Other chronic pain: Secondary | ICD-10-CM | POA: Diagnosis not present

## 2019-10-10 DIAGNOSIS — Z131 Encounter for screening for diabetes mellitus: Secondary | ICD-10-CM | POA: Diagnosis not present

## 2019-10-10 DIAGNOSIS — Z79899 Other long term (current) drug therapy: Secondary | ICD-10-CM

## 2019-10-10 DIAGNOSIS — M19041 Primary osteoarthritis, right hand: Secondary | ICD-10-CM

## 2019-10-10 LAB — CBC
HCT: 34.3 % — ABNORMAL LOW (ref 36.0–46.0)
Hemoglobin: 11.1 g/dL — ABNORMAL LOW (ref 12.0–15.0)
MCHC: 32.4 g/dL (ref 30.0–36.0)
MCV: 88.9 fl (ref 78.0–100.0)
Platelets: 268 10*3/uL (ref 150.0–400.0)
RBC: 3.85 Mil/uL — ABNORMAL LOW (ref 3.87–5.11)
RDW: 14.1 % (ref 11.5–15.5)
WBC: 6 10*3/uL (ref 4.0–10.5)

## 2019-10-10 LAB — COMPREHENSIVE METABOLIC PANEL
ALT: 13 U/L (ref 0–35)
AST: 13 U/L (ref 0–37)
Albumin: 4.1 g/dL (ref 3.5–5.2)
Alkaline Phosphatase: 54 U/L (ref 39–117)
BUN: 22 mg/dL (ref 6–23)
CO2: 31 mEq/L (ref 19–32)
Calcium: 9.5 mg/dL (ref 8.4–10.5)
Chloride: 103 mEq/L (ref 96–112)
Creatinine, Ser: 1.2 mg/dL (ref 0.40–1.20)
GFR: 44.09 mL/min — ABNORMAL LOW (ref >60.00)
Glucose, Bld: 92 mg/dL (ref 70–99)
Potassium: 3.8 mEq/L (ref 3.5–5.1)
Sodium: 141 mEq/L (ref 135–145)
Total Bilirubin: 0.6 mg/dL (ref 0.2–1.2)
Total Protein: 6.4 g/dL (ref 6.0–8.3)

## 2019-10-10 LAB — LIPID PANEL
Cholesterol: 190 mg/dL (ref 0–200)
HDL: 49.1 mg/dL (ref >39.00)
LDL Cholesterol: 112 mg/dL — ABNORMAL HIGH (ref 0–99)
NonHDL: 140.4
Total CHOL/HDL Ratio: 4
Triglycerides: 144 mg/dL (ref 0.0–149.0)
VLDL: 28.8 mg/dL (ref 0.0–40.0)

## 2019-10-10 LAB — HEMOGLOBIN A1C: Hgb A1c MFr Bld: 5.2 % (ref 4.6–6.5)

## 2019-10-10 LAB — T4, FREE: Free T4: 0.87 ng/dL (ref 0.60–1.60)

## 2019-10-10 LAB — TSH: TSH: 3.22 u[IU]/mL (ref 0.35–4.50)

## 2019-10-10 LAB — VITAMIN D 25 HYDROXY (VIT D DEFICIENCY, FRACTURES): VITD: 33.21 ng/mL (ref 30.00–100.00)

## 2019-10-10 MED ORDER — OMEPRAZOLE 40 MG PO CPDR: 40.0000 mg | DELAYED_RELEASE_CAPSULE | Freq: Two times a day (BID) | ORAL | 1 refills | Status: DC

## 2019-10-10 MED ORDER — TRAZODONE HCL 100 MG PO TABS: 100.0000 mg | ORAL_TABLET | Freq: Every day | ORAL | 1 refills | Status: DC

## 2019-10-10 MED ORDER — ATENOLOL 25 MG PO TABS: 25.0000 mg | ORAL_TABLET | Freq: Every morning | ORAL | 1 refills | Status: DC

## 2019-10-10 MED ORDER — LOSARTAN POTASSIUM-HCTZ 50-12.5 MG PO TABS: 1.0000 | ORAL_TABLET | Freq: Every day | ORAL | 1 refills | Status: DC

## 2019-10-10 MED ORDER — DICYCLOMINE HCL 10 MG PO CAPS: ORAL_CAPSULE | ORAL | 1 refills | Status: DC

## 2019-10-10 MED ORDER — ESCITALOPRAM OXALATE 20 MG PO TABS: 20.0000 mg | ORAL_TABLET | Freq: Every day | ORAL | 1 refills | Status: DC

## 2019-10-10 MED ORDER — LORAZEPAM 0.5 MG PO TABS: 0.5000 mg | ORAL_TABLET | Freq: Two times a day (BID) | ORAL | 5 refills | Status: DC | PRN

## 2019-10-10 MED ORDER — DULOXETINE HCL 60 MG PO CPEP: 60.0000 mg | ORAL_CAPSULE | Freq: Two times a day (BID) | ORAL | 1 refills | Status: DC

## 2019-10-10 MED ORDER — PRAVASTATIN SODIUM 40 MG PO TABS: 40.0000 mg | ORAL_TABLET | Freq: Every day | ORAL | 3 refills | Status: DC

## 2019-10-10 MED ORDER — TETANUS-DIPHTH-ACELL PERTUSSIS 5-2.5-18.5 LF-MCG/0.5 IM SUSP: 0.5000 mL | Freq: Once | INTRAMUSCULAR | 0 refills | Status: AC

## 2019-10-10 MED ORDER — TRAMADOL HCL 50 MG PO TABS: 50.0000 mg | ORAL_TABLET | Freq: Two times a day (BID) | ORAL | 5 refills | Status: DC | PRN

## 2019-10-10 NOTE — Patient Instructions (Addendum)
Health Maintenance After Age 73 After age 73, you are at a higher risk for certain long-term diseases and infections as well as injuries from falls. Falls are a major cause of broken bones and head injuries in people who are older than age 73. Getting regular preventive care can help to keep you healthy and well. Preventive care includes getting regular testing and making lifestyle changes as recommended by your health care provider. Talk with your health care provider about:  Which screenings and tests you should have. A screening is a test that checks for a disease when you have no symptoms.  A diet and exercise plan that is right for you. What should I know about screenings and tests to prevent falls? Screening and testing are the best ways to find a health problem early. Early diagnosis and treatment give you the best chance of managing medical conditions that are common after age 73. Certain conditions and lifestyle choices may make you more likely to have a fall. Your health care provider may recommend:  Regular vision checks. Poor vision and conditions such as cataracts can make you more likely to have a fall. If you wear glasses, make sure to get your prescription updated if your vision changes.  Medicine review. Work with your health care provider to regularly review all of the medicines you are taking, including over-the-counter medicines. Ask your health care provider about any side effects that may make you more likely to have a fall. Tell your health care provider if any medicines that you take make you feel dizzy or sleepy.  Osteoporosis screening. Osteoporosis is a condition that causes the bones to get weaker. This can make the bones weak and cause them to break more easily.  Blood pressure screening. Blood pressure changes and medicines to control blood pressure can make you feel dizzy.  Strength and balance checks. Your health care provider may recommend certain tests to check your  strength and balance while standing, walking, or changing positions.  Foot health exam. Foot pain and numbness, as well as not wearing proper footwear, can make you more likely to have a fall.  Depression screening. You may be more likely to have a fall if you have a fear of falling, feel emotionally low, or feel unable to do activities that you used to do.  Alcohol use screening. Using too much alcohol can affect your balance and may make you more likely to have a fall. What actions can I take to lower my risk of falls? General instructions  Talk with your health care provider about your risks for falling. Tell your health care provider if: ? You fall. Be sure to tell your health care provider about all falls, even ones that seem minor. ? You feel dizzy, sleepy, or off-balance.  Take over-the-counter and prescription medicines only as told by your health care provider. These include any supplements.  Eat a healthy diet and maintain a healthy weight. A healthy diet includes low-fat dairy products, low-fat (lean) meats, and fiber from whole grains, beans, and lots of fruits and vegetables. Home safety  Remove any tripping hazards, such as rugs, cords, and clutter.  Install safety equipment such as grab bars in bathrooms and safety rails on stairs.  Keep rooms and walkways well-lit. Activity   Follow a regular exercise program to stay fit. This will help you maintain your balance. Ask your health care provider what types of exercise are appropriate for you.  If you need a cane or   walker, use it as recommended by your health care provider.  Wear supportive shoes that have nonskid soles. Lifestyle  Do not drink alcohol if your health care provider tells you not to drink.  If you drink alcohol, limit how much you have: ? 0-1 drink a day for women. ? 0-2 drinks a day for men.  Be aware of how much alcohol is in your drink. In the [place] one drink equals one typical bottle of beer (12  oz), one-half glass of wine (5 oz), or one shot of hard liquor (1 oz).  Do not use any products that contain nicotine or tobacco, such as cigarettes and e-cigarettes. If you need help quitting, ask your health care provider. Summary  Having a healthy lifestyle and getting preventive care can help to protect your health and wellness after age 28.  Screening and testing are the best way to find a health problem early and help you avoid having a fall. Early diagnosis and treatment give you the best chance for managing medical conditions that are more common for people who are older than age 37.  Falls are a major cause of broken bones and head injuries in people who are older than age 63. Take precautions to prevent a fall at home.  Work with your health care provider to learn what changes you can make to improve your health and wellness and to prevent falls.   covid vaccines: Pay attention to the news the next few week- COVID vaccines are starting for the community.  Appointments are required and can be made beginning at 8 a.m. Once your age bracket is called (watch news) appt can be made by calling [phone] and selecting option 2. Walk-ins will not be accepted.                  Clinic locations are: [facility], [place]; [facility] [address] [place]; . [place] at [facility], [id number], [place].  Participants are asked to wear a face covering at vaccination sites. Visit [email or web address] and click on the "[id number] Vaccine Info" rectangle for more information about vaccinations.

## 2019-10-10 NOTE — Progress Notes (Signed)
[name] [name] , [birth year], 74 y.o., female MRN: [id number] Patient Care Team    Relationship Specialty Notifications Start End  Ma Hillock, DO PCP - General Family Medicine  06/02/15   Gatha Mayer, MD Consulting Physician Gastroenterology  11/24/15   Calvert Cantor, MD Consulting Physician Ophthalmology  11/24/15   Specialists, [facility]  Orthopedic Surgery  04/12/16   Pa, [facility]    01/16/18   Rheumatology, Ophthal[facility]    01/16/18   Kathrynn Ducking, MD Consulting Physician Neurology  10/24/18     Chief Complaint  Patient presents with  . Hypertension    Fasting.  Follow-up on all chronic medical conditions.    Subjective:  Hypertension/hyperlipidemia/CKD3:  [name] [name] reports compliance with Pravastatin 40 mg, losartan-HCTZ 50-12.5 mg daily mg and atenolol 25 mg. Patient denies chest pain, shortness of breath, dizziness or lower extremity edema.  She does endorse palpitations that occur mostly when she "has herself worked up" or anxious. [name] [name] does not take daily baby ASA. [name] [name] is  prescribed statin. Labs are due today. Diet: Low-sodium Exercise: Attempts RF: Hypertension, hyperlipidemia. Family history of heart disease, former smoker, obesity  Anxiety/panic attack/insomnia: Patient reports compliance with lexapro 20 mg daily, Cymbalta twice a day, trazodone 100 mg nightly.. She had been on zoloft and tapered off when lexapro started. Cymbalta has been at maximum dose and likely chosen secondary to her lumbar/arthritis issues.  She feels she is doing better than when she was here last time.  She does continue to use the Ativan twice daily.    Arthritis/pain/lumbar pain with radiation down right leg: Patient reports compliance with tramadol 50 mg twice a day when necessary for arthritic pain. She is in need of refills today.She complains of low back and right leg pain throughout the day as well. She has h/o anterior cervical decompression/discectomy in the  past. Indication for chronic opioid: Moderate to severe arthritis and osteoporosis discomfort Medication and dose: Tramadol 50 mg twice daily as needed # pills per: #60 Last UDS date: today collected today Pain contract signed (Y/N): Yes Date narcotic database last reviewed (include red flags): 10/12/19    Depression screen [facility] 2/9 10/10/2019 04/24/2019 01/23/2019  Decreased Interest 0 0 1  Down, Depressed, Hopeless 0 0 0  PHQ - 2 Score 0 0 1  Altered sleeping 0 0 0  Tired, decreased energy '1 3 1  ' Change in appetite 0 3 0  Feeling bad or failure about yourself  0 0 0  Trouble concentrating 0 0 0  Moving slowly or fidgety/restless 0 0 0  Suicidal thoughts 0 0 0  PHQ-9 Score '1 6 2  ' Difficult doing work/chores Not difficult at all - Not difficult at all  Some recent data might be hidden   GAD 7 : Generalized Anxiety Score 10/10/2019 04/24/2019 01/23/2019 01/19/2019  Nervous, Anxious, on Edge '1 2 1 1  ' Control/stop worrying 0 2 0 1  Worry too much - different things 0 '2 3 1  ' Trouble relaxing 1 0 0 1  Restless 0 0 0 0  Easily annoyed or irritable 0 0 0 0  Afraid - awful might happen 1 0 1 1  Total GAD 7 Score '3 6 5 5  ' Anxiety Difficulty Not difficult at all - Not difficult at all Not difficult at all    Allergies  Allergen Reactions  . Meloxicam Swelling    Caused edema and Cr increase  . Morphine Nausea And Vomiting  SEVERE  . Sulfa Antibiotics Swelling   Social History   Tobacco Use  . Smoking status: Former Smoker    Packs/day: 1.00    Years: 15.00    Pack years: 15.00    Types: Cigarettes    Start date: 12/06/1993    Quit date: 08/09/2009    Years since quitting: 10.1  . Smokeless tobacco: Never Used  . Tobacco comment: quit smoking 5 years ago  Substance Use Topics  . Alcohol use: Yes    Comment: ocassional   Past Medical History:  Diagnosis Date  . Anxiety   . Arthritis    oa  . Chronic cystitis   . 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  DDD (degenerative disc disease), cervical   . Depression   . Fibromyalgia   . GERD (gastroesophageal reflux disease)   . Headache    sinus  . Heart murmur   . History of duodenal ulcer    2007  . History of gastritis    2007  . HTN (hypertension)   . Hyperlipidemia   . Internal and external hemorrhoids without complication   . Intrinsic (urethral) sphincter deficiency (ISD)   . Irritable bowel syndrome   . Osteopenia   . Pernicious anemia   . Pneumonia   . PONV (postoperative nausea and vomiting)   . Unspecified essential hypertension   . Unspecified venous (peripheral) insufficiency    wears compression hose  . Unspecified vitamin D deficiency   . Urine incontinence    Past Surgical History:  Procedure Laterality Date  . ANTERIOR AND POSTERIOR VAGINAL REPAIR  04-29-2004   AND TRANSVAGINAL TAPE SLING  . ANTERIOR CERVICAL DECOMP/DISCECTOMY FUSION  1999  . APPENDECTOMY  1984  . BILATERAL SALPINGOOPHORECTOMY  1984  . CARDIAC CATHETERIZATION  10-03-2002   DR DEGENT   NORMAL CORONARIES ARTERIES/  EF 55%  . CYSTOSCOPY N/A 08/27/2013   Procedure: CYSTOSCOPY;  Surgeon: Ailene Rud, MD;  Location: [facility];  Service: Urology;  Laterality: N/A;  . CYSTOSCOPY WITH INJECTION N/A 12/17/2013   Procedure: Ascencion Dike WITH INJECTION;  Surgeon: Ailene Rud, MD;  Location: [facility];  Service: Urology;  Laterality: N/A;  . DILATION AND CURETTAGE OF UTERUS    . EXCISION RIGHT NECK AND LEFT BREAST SEBACEOUS CYST  05-18-2002  . HEMORRHOID BANDING  2014  . PUBOVAGINAL SLING N/A 10/25/2016   Procedure: Gaynelle Arabian;  Surgeon: Carolan Clines, MD;  Location: [facility];  Service: Urology;  Laterality: N/A;  . right litlle finger surgery  yrs ago   cyst removed x 4  . TOTAL ABDOMINAL HYSTERECTOMY  1986  . TUBAL LIGATION    . VAGINAL PROLAPSE REPAIR N/A 08/27/2013   Procedure: ANTERIOR VAGINAL VAULT SUSPENSION, KELLY PLICATION WITH  SACROSPINOUS LIGAMENT FIXATION AND XENFORM BOVINE DERMIS GRAFT AUGMENTATION, URETHRAL EXPLORATION, URETHROLYSIS, EXPLANTATION OF TVT TAPE, IMPLANTATION OF FLOSEAL, IMPLANTATION OF XENFORM BOVINE GRAFT IN PERIURETHRAL SPACE;  Surgeon: Ailene Rud, MD;  Location: [facility];  Service: Urology;  Laterality: N/A  . VAGINAL PROLAPSE REPAIR N/A 10/25/2016   Procedure: VAGINAL VAULT SUSPENSION with mesh and sacrospinous repair;  Surgeon: Carolan Clines, MD;  Location: [facility];  Service: Urology;  Laterality: N/A;   Family History  Problem Relation Age of Onset  . Lung cancer Father   . Coronary artery disease Father   . Hypertension Mother   . Heart disease Mother   . Myelodysplastic syndrome Mother   .  Arthritis Mother   . Cervical cancer Mother   . Dementia Maternal Aunt   . Fibromyalgia Sister   . Hypertension Sister   . Anemia Sister   . Depression Sister   . Hypertension Brother   . Anemia Brother   . Hypertension Brother   . Hyperlipidemia Brother   . Colon polyps Other        aunt  . Heart disease Maternal Uncle   . Lung cancer Paternal Uncle   . Kidney disease Sister        kidney stones, removed kidney  . Dementia Maternal Aunt   . Lung cancer Maternal Uncle   . Other Paternal Uncle        brain Tumor  . Pancreatic cancer Cousin   . Colon cancer Neg Hx    Allergies as of 10/10/2019      Reactions   Meloxicam Swelling   Caused edema and Cr increase   Morphine Nausea And Vomiting   SEVERE   Sulfa Antibiotics Swelling      Medication List       Accurate as of October 10, 2019 11:59 PM. If you have any questions, ask your nurse or doctor.        atenolol 25 MG tablet Commonly known as: TENORMIN Take 1 tablet (25 mg total) by mouth every morning.   calcium carbonate 600 MG Tabs tablet Commonly known as: OS-CAL Take 600 mg by mouth daily.   cetirizine 10 MG tablet Commonly known as: ZYRTEC Take 10 mg by mouth daily as needed for  allergies.   cholecalciferol 1000 units tablet Commonly known as: VITAMIN D Take 2 tablets (2,000 Units total) by mouth daily.   clotrimazole 1 % cream Commonly known as: LOTRIMIN Apply 1 application topically 2 (two) times daily. To affected area   dicyclomine 10 MG capsule Commonly known as: BENTYL TAKE 1 CAPSULE FOUR TIMES DAILY BEFORE MEALS AND AT BEDTIME   DULoxetine 60 MG capsule Commonly known as: CYMBALTA Take 1 capsule (60 mg total) by mouth 2 (two) times daily.   escitalopram 20 MG tablet Commonly known as: LEXAPRO Take 1 tablet (20 mg total) by mouth daily.   ESTRACE VAGINAL 0.1 MG/GM vaginal cream Generic drug: estradiol Estrace 0.01% (0.1 mg/gram) vaginal cream  1 gm q hs x14 then 1/2 gm 2x/wk   FISH OIL PO Take 1 capsule by mouth daily.   fluticasone 50 MCG/ACT nasal spray Commonly known as: FLONASE Place into both nostrils as needed.   LORazepam 0.5 MG tablet Commonly known as: ATIVAN Take 1 tablet (0.5 mg total) by mouth every 12 (twelve) hours as needed for anxiety (for panic attack).   losartan-hydrochlorothiazide 50-12.5 MG tablet Commonly known as: HYZAAR Take 1 tablet by mouth daily.   omeprazole 40 MG capsule Commonly known as: PRILOSEC Take 1 capsule (40 mg total) by mouth 2 (two) times daily before a meal. 30 mins before breakfast and supper   oxybutynin 10 MG 24 hr tablet Commonly known as: DITROPAN-XL Take 10 mg by mouth daily.   pravastatin 40 MG tablet Commonly known as: PRAVACHOL Take 1 tablet (40 mg total) by mouth daily.   RECLAST IV Inject into the vein.   Tdap 5-2.5-18.5 LF-MCG/0.5 injection Commonly known as: BOOSTRIX Inject 0.5 mLs into the muscle once for 1 dose. Started by: Howard Pouch, DO   Toviaz 4 MG Tb24 tablet Generic drug: fesoterodine Take 4 mg by mouth daily.   traMADol 50 MG tablet Commonly known as: Veatrice Bourbon  Take 1 tablet (50 mg total) by mouth 2 (two) times daily as needed for moderate pain. TAKES 1 AT  BEDTIME AND 1 DURING THE DAY ONLY IF NEEDED   traZODone 100 MG tablet Commonly known as: DESYREL Take 1 tablet (100 mg total) by mouth at bedtime.   Turmeric Curcumin 500 MG Caps Take 1,000 mg by mouth daily.   Vitamin B-12 1000 MCG Subl Place 1 tablet under the tongue daily.   WOMENS MULTIVITAMIN PLUS PO Take 1 tablet by mouth daily.       No results found for this or any previous visit (from the past 24 hour(s)). No results found.   ROS: Negative, with the exception of above mentioned in HPI   Objective:  BP 116/77 (BP Location: Left Arm, Patient Position: Sitting, Cuff Size: Normal)   Pulse 90   Temp (!) 97.2 F (36.2 C) (Temporal)   Resp 16   Ht 5' 5.5" (1.664 m)   Wt 197 lb (89.4 kg)   SpO2 96%   BMI 32.28 kg/m  Body mass index is 32.28 kg/m. Gen: Afebrile. No acute distress.  Nontoxic in appearance, well-developed, well-nourished, Caucasian female.  Obese. HENT: AT. [place].  Eyes:Pupils Equal Round Reactive to light, Extraocular movements intact,  Conjunctiva without redness, discharge or icterus. Neck/lymp/endocrine: Supple, no lymphadenopathy, no thyromegaly CV: RRR no murmur, no edema, +2/4 P posterior tibialis pulses Chest: CTAB, no wheeze or crackles Abd: Soft.  Obese. NTND. BS present.  No masses palpated.  Skin: No rashes, purpura or petechiae.  Neuro:  Normal gait. PERLA. EOMi. Alert. Oriented x3  Psych: Normal affect, dress and demeanor. Normal speech. Normal thought content and judgment.  Assessment/Plan: VIRGIL SLINGER is a 74 y.o. female present for OV for  TYYNE CLIETT is a 74 y.o. female present for follow up  Depression with anxiety/insomnia/panic d/o/benzo use Stable -Continue Lexapro 20 mg daily- refills provided today - Continue Cymbalta 60 mg twice a day. - Continue trazodone 100 mg daily at bedtime. - Refill on Ativan provided for 6 months. NCCS database reviewed and appropriate 10/12/19  - UDS and tramadol urine collected today -  Refills provided today on all the above medications. - Controlled substance contract signed. - [name] [name] declined psychology referral.  - F/U 6 months as long as doing well, sooner if needed.   Hypertension/morbid obesity/hyperlipidemia/CKD3/palpitations: -Stable.  Continue losartan-HCTZ, atenolol and statin.  Refills provided today. - atenolol to 12.5 mg daily. If palpitations worsen, can return to atenolol 25 mg daily.   - Continue low-sodium diet, exercise greater than 150 minutes a week. - Follow-up 6 months   Diabetes screening: A1c collected today Vitamin D deficiency/osteoporosis: Vitamin D level collected today Arthritis/pain/lumbar pain: -Table.  Better controlled now on medications. -Continue tramadol for chronic arthritic pain. Encouraged her to take tramadol every 12 hours daily (was only routinely taking once).  - UDS and urine tramadol collected today - contract up-to-date. - NCCS database reviewed 10/12/19 and appropriate.  - Follow-up in 6 months if needing prescription refilled.  F/U 6 months on [facility]   Orders Placed This Encounter  Procedures  . CBC  . Comp Met (CMET)  . HgB A1c  . Lipid panel  . TSH  . T4, free  . Pain Mgmt, Profile 8 w/Conf, U  . Vitamin D (25 hydroxy)   Meds ordered this encounter  Medications  . Tdap (BOOSTRIX) 5-2.5-18.5 LF-MCG/0.5 injection    Sig: Inject 0.5 mLs into the muscle once for 1 dose.  Dispense:  0.5 mL    Refill:  0  . traZODone (DESYREL) 100 MG tablet    Sig: Take 1 tablet (100 mg total) by mouth at bedtime.    Dispense:  90 tablet    Refill:  1  . traMADol (ULTRAM) 50 MG tablet    Sig: Take 1 tablet (50 mg total) by mouth 2 (two) times daily as needed for moderate pain. TAKES 1 AT BEDTIME AND 1 DURING THE DAY ONLY IF NEEDED    Dispense:  60 tablet    Refill:  5    NO refills.  Marland Kitchen omeprazole (PRILOSEC) 40 MG capsule    Sig: Take 1 capsule (40 mg total) by mouth 2 (two) times daily before a meal. 30 mins before breakfast  and supper    Dispense:  180 capsule    Refill:  1  . pravastatin (PRAVACHOL) 40 MG tablet    Sig: Take 1 tablet (40 mg total) by mouth daily.    Dispense:  90 tablet    Refill:  3  . losartan-hydrochlorothiazide (HYZAAR) 50-12.5 MG tablet    Sig: Take 1 tablet by mouth daily.    Dispense:  90 tablet    Refill:  1    Do not request refills.  Marland Kitchen LORazepam (ATIVAN) 0.5 MG tablet    Sig: Take 1 tablet (0.5 mg total) by mouth every 12 (twelve) hours as needed for anxiety (for panic attack).    Dispense:  60 tablet    Refill:  5  . escitalopram (LEXAPRO) 20 MG tablet    Sig: Take 1 tablet (20 mg total) by mouth daily.    Dispense:  90 tablet    Refill:  1  . DULoxetine (CYMBALTA) 60 MG capsule    Sig: Take 1 capsule (60 mg total) by mouth 2 (two) times daily.    Dispense:  180 capsule    Refill:  1  . dicyclomine (BENTYL) 10 MG capsule    Sig: TAKE 1 CAPSULE FOUR TIMES DAILY BEFORE MEALS AND AT BEDTIME    Dispense:  120 capsule    Refill:  1    Do not request refills  . atenolol (TENORMIN) 25 MG tablet    Sig: Take 1 tablet (25 mg total) by mouth every morning.    Dispense:  90 tablet    Refill:  1   Referral Orders  No referral(s) requested today    electronically signed by:  Howard Pouch, DO  [place]

## 2019-10-12 ENCOUNTER — Encounter: Payer: Self-pay | Admitting: Family Medicine

## 2019-10-13 LAB — PAIN MGMT, PROFILE 8 W/CONF, U
6 Acetylmorphine: NEGATIVE ng/mL
Alcohol Metabolites: NEGATIVE ng/mL (ref <500)
Alphahydroxyalprazolam: NEGATIVE ng/mL
Alphahydroxymidazolam: NEGATIVE ng/mL
Alphahydroxytriazolam: NEGATIVE ng/mL
Aminoclonazepam: NEGATIVE ng/mL
Amphetamines: NEGATIVE ng/mL
Benzodiazepines: POSITIVE ng/mL
Buprenorphine, Urine: NEGATIVE ng/mL
Cocaine Metabolite: NEGATIVE ng/mL
Codeine: NEGATIVE ng/mL
Creatinine: 106.2 mg/dL
Hydrocodone: NEGATIVE ng/mL
Hydromorphone: NEGATIVE ng/mL
Hydroxyethylflurazepam: NEGATIVE ng/mL
Lorazepam: 404 ng/mL
MDMA: NEGATIVE ng/mL
Marijuana Metabolite: NEGATIVE ng/mL
Morphine: NEGATIVE ng/mL
Nordiazepam: NEGATIVE ng/mL
Norhydrocodone: NEGATIVE ng/mL
Opiates: NEGATIVE ng/mL
Oxazepam: NEGATIVE ng/mL
Oxidant: NEGATIVE ug/mL
Oxycodone: NEGATIVE ng/mL
Temazepam: NEGATIVE ng/mL
pH: 5.6 (ref 4.5–9.0)

## 2019-10-24 ENCOUNTER — Encounter (HOSPITAL_BASED_OUTPATIENT_CLINIC_OR_DEPARTMENT_OTHER): Payer: Self-pay | Admitting: Surgery

## 2019-10-24 ENCOUNTER — Other Ambulatory Visit: Payer: Self-pay

## 2019-10-26 ENCOUNTER — Other Ambulatory Visit: Payer: Self-pay

## 2019-10-26 ENCOUNTER — Encounter (HOSPITAL_BASED_OUTPATIENT_CLINIC_OR_DEPARTMENT_OTHER)
Admission: RE | Admit: 2019-10-26 | Discharge: 2019-10-26 | Disposition: A | Discharge status: Home / Self Care | Payer: Medicare HMO | Source: Ambulatory Visit | Attending: Surgery | Admitting: Surgery

## 2019-10-26 DIAGNOSIS — Z01812 Encounter for preprocedural laboratory examination: Secondary | ICD-10-CM | POA: Insufficient documentation

## 2019-10-26 LAB — CBC WITH DIFFERENTIAL/PLATELET
Abs Immature Granulocytes: 0.01 10*3/uL (ref 0.00–0.07)
Basophils Absolute: 0 10*3/uL (ref 0.0–0.1)
Basophils Relative: 1 %
Eosinophils Absolute: 0.2 10*3/uL (ref 0.0–0.5)
Eosinophils Relative: 3 %
HCT: 37.1 % (ref 36.0–46.0)
Hemoglobin: 11.6 g/dL — ABNORMAL LOW (ref 12.0–15.0)
Immature Granulocytes: 0 %
Lymphocytes Relative: 30 %
Lymphs Abs: 2 10*3/uL (ref 0.7–4.0)
MCH: 28.6 pg (ref 26.0–34.0)
MCHC: 31.3 g/dL (ref 30.0–36.0)
MCV: 91.4 fL (ref 80.0–100.0)
Monocytes Absolute: 0.5 10*3/uL (ref 0.1–1.0)
Monocytes Relative: 7 %
Neutro Abs: 3.8 10*3/uL (ref 1.7–7.7)
Neutrophils Relative %: 59 %
Platelets: 261 10*3/uL (ref 150–400)
RBC: 4.06 MIL/uL (ref 3.87–5.11)
RDW: 13.1 % (ref 11.5–15.5)
WBC: 6.4 10*3/uL (ref 4.0–10.5)
nRBC: 0 % (ref 0.0–0.2)

## 2019-10-26 LAB — COMPREHENSIVE METABOLIC PANEL
ALT: 14 U/L (ref 0–44)
AST: 15 U/L (ref 15–41)
Albumin: 3.7 g/dL (ref 3.5–5.0)
Alkaline Phosphatase: 49 U/L (ref 38–126)
Anion gap: 10 (ref 5–15)
BUN: 20 mg/dL (ref 8–23)
CO2: 30 mmol/L (ref 22–32)
Calcium: 9.8 mg/dL (ref 8.9–10.3)
Chloride: 102 mmol/L (ref 98–111)
Creatinine, Ser: 1.18 mg/dL — ABNORMAL HIGH (ref 0.44–1.00)
GFR calc Af Amer: 53 mL/min — ABNORMAL LOW (ref >60)
GFR calc non Af Amer: 46 mL/min — ABNORMAL LOW (ref >60)
Glucose, Bld: 102 mg/dL — ABNORMAL HIGH (ref 70–99)
Potassium: 4.4 mmol/L (ref 3.5–5.1)
Sodium: 142 mmol/L (ref 135–145)
Total Bilirubin: 0.8 mg/dL (ref 0.3–1.2)
Total Protein: 6.7 g/dL (ref 6.5–8.1)

## 2019-10-26 NOTE — Progress Notes (Signed)

## 2019-10-27 ENCOUNTER — Other Ambulatory Visit (HOSPITAL_COMMUNITY)
Admission: RE | Admit: 2019-10-27 | Discharge: 2019-10-27 | Disposition: A | Discharge status: Home / Self Care | Payer: Medicare HMO | Source: Ambulatory Visit | Attending: Surgery | Admitting: Surgery

## 2019-10-27 DIAGNOSIS — Z20822 Contact with and (suspected) exposure to covid-19: Secondary | ICD-10-CM | POA: Diagnosis not present

## 2019-10-27 DIAGNOSIS — Z01812 Encounter for preprocedural laboratory examination: Secondary | ICD-10-CM | POA: Diagnosis not present

## 2019-10-27 LAB — SARS CORONAVIRUS 2 (TAT 6-24 HRS): SARS Coronavirus 2: NEGATIVE

## 2019-10-30 ENCOUNTER — Ambulatory Visit
Admission: RE | Admit: 2019-10-30 | Discharge: 2019-10-30 | Disposition: A | Discharge status: Home / Self Care | Payer: Medicare HMO | Source: Ambulatory Visit | Attending: Surgery | Admitting: Surgery

## 2019-10-30 ENCOUNTER — Other Ambulatory Visit: Payer: Self-pay

## 2019-10-30 DIAGNOSIS — D242 Benign neoplasm of left breast: Secondary | ICD-10-CM

## 2019-10-30 DIAGNOSIS — R928 Other abnormal and inconclusive findings on diagnostic imaging of breast: Secondary | ICD-10-CM | POA: Diagnosis not present

## 2019-10-31 ENCOUNTER — Ambulatory Visit (HOSPITAL_BASED_OUTPATIENT_CLINIC_OR_DEPARTMENT_OTHER): Payer: Medicare HMO | Admitting: Anesthesiology

## 2019-10-31 ENCOUNTER — Other Ambulatory Visit: Payer: Self-pay

## 2019-10-31 ENCOUNTER — Encounter (HOSPITAL_BASED_OUTPATIENT_CLINIC_OR_DEPARTMENT_OTHER): Payer: Self-pay | Admitting: Surgery

## 2019-10-31 ENCOUNTER — Ambulatory Visit (HOSPITAL_BASED_OUTPATIENT_CLINIC_OR_DEPARTMENT_OTHER)
Admission: RE | Admit: 2019-10-31 | Discharge: 2019-10-31 | Disposition: A | Discharge status: Home / Self Care | Payer: Medicare HMO | Source: Ambulatory Visit | Attending: Surgery | Admitting: Surgery

## 2019-10-31 ENCOUNTER — Encounter (HOSPITAL_BASED_OUTPATIENT_CLINIC_OR_DEPARTMENT_OTHER)
Admission: RE | Disposition: A | Discharge status: Home / Self Care | Payer: Self-pay | Source: Ambulatory Visit | Attending: Surgery

## 2019-10-31 ENCOUNTER — Ambulatory Visit
Admission: RE | Admit: 2019-10-31 | Discharge: 2019-10-31 | Disposition: A | Discharge status: Home / Self Care | Payer: Medicare HMO | Source: Ambulatory Visit | Attending: Surgery | Admitting: Surgery

## 2019-10-31 DIAGNOSIS — Z8261 Family history of arthritis: Secondary | ICD-10-CM | POA: Insufficient documentation

## 2019-10-31 DIAGNOSIS — D242 Benign neoplasm of left breast: Secondary | ICD-10-CM

## 2019-10-31 DIAGNOSIS — Z9049 Acquired absence of other specified parts of digestive tract: Secondary | ICD-10-CM | POA: Diagnosis not present

## 2019-10-31 DIAGNOSIS — Z8349 Family history of other endocrine, nutritional and metabolic diseases: Secondary | ICD-10-CM | POA: Insufficient documentation

## 2019-10-31 DIAGNOSIS — E7800 Pure hypercholesterolemia, unspecified: Secondary | ICD-10-CM | POA: Insufficient documentation

## 2019-10-31 DIAGNOSIS — F329 Major depressive disorder, single episode, unspecified: Secondary | ICD-10-CM | POA: Diagnosis not present

## 2019-10-31 DIAGNOSIS — Z882 Allergy status to sulfonamides status: Secondary | ICD-10-CM | POA: Diagnosis not present

## 2019-10-31 DIAGNOSIS — F17200 Nicotine dependence, unspecified, uncomplicated: Secondary | ICD-10-CM | POA: Insufficient documentation

## 2019-10-31 DIAGNOSIS — I739 Peripheral vascular disease, unspecified: Secondary | ICD-10-CM | POA: Insufficient documentation

## 2019-10-31 DIAGNOSIS — Z8249 Family history of ischemic heart disease and other diseases of the circulatory system: Secondary | ICD-10-CM | POA: Diagnosis not present

## 2019-10-31 DIAGNOSIS — Z832 Family history of diseases of the blood and blood-forming organs and certain disorders involving the immune mechanism: Secondary | ICD-10-CM | POA: Diagnosis not present

## 2019-10-31 DIAGNOSIS — D649 Anemia, unspecified: Secondary | ICD-10-CM | POA: Insufficient documentation

## 2019-10-31 DIAGNOSIS — Z8049 Family history of malignant neoplasm of other genital organs: Secondary | ICD-10-CM | POA: Insufficient documentation

## 2019-10-31 DIAGNOSIS — M797 Fibromyalgia: Secondary | ICD-10-CM | POA: Insufficient documentation

## 2019-10-31 DIAGNOSIS — Z885 Allergy status to narcotic agent status: Secondary | ICD-10-CM | POA: Insufficient documentation

## 2019-10-31 DIAGNOSIS — Z86010 Personal history of colonic polyps: Secondary | ICD-10-CM | POA: Diagnosis not present

## 2019-10-31 DIAGNOSIS — K219 Gastro-esophageal reflux disease without esophagitis: Secondary | ICD-10-CM | POA: Diagnosis not present

## 2019-10-31 DIAGNOSIS — D241 Benign neoplasm of right breast: Secondary | ICD-10-CM | POA: Diagnosis not present

## 2019-10-31 DIAGNOSIS — F419 Anxiety disorder, unspecified: Secondary | ICD-10-CM | POA: Insufficient documentation

## 2019-10-31 DIAGNOSIS — G709 Myoneural disorder, unspecified: Secondary | ICD-10-CM | POA: Insufficient documentation

## 2019-10-31 DIAGNOSIS — Z809 Family history of malignant neoplasm, unspecified: Secondary | ICD-10-CM | POA: Insufficient documentation

## 2019-10-31 DIAGNOSIS — D631 Anemia in chronic kidney disease: Secondary | ICD-10-CM | POA: Diagnosis not present

## 2019-10-31 DIAGNOSIS — N6022 Fibroadenosis of left breast: Secondary | ICD-10-CM | POA: Diagnosis not present

## 2019-10-31 DIAGNOSIS — M1990 Unspecified osteoarthritis, unspecified site: Secondary | ICD-10-CM | POA: Diagnosis not present

## 2019-10-31 DIAGNOSIS — Z836 Family history of other diseases of the respiratory system: Secondary | ICD-10-CM | POA: Insufficient documentation

## 2019-10-31 DIAGNOSIS — N62 Hypertrophy of breast: Secondary | ICD-10-CM | POA: Diagnosis not present

## 2019-10-31 DIAGNOSIS — I129 Hypertensive chronic kidney disease with stage 1 through stage 4 chronic kidney disease, or unspecified chronic kidney disease: Secondary | ICD-10-CM | POA: Diagnosis not present

## 2019-10-31 DIAGNOSIS — Z79899 Other long term (current) drug therapy: Secondary | ICD-10-CM | POA: Insufficient documentation

## 2019-10-31 DIAGNOSIS — Z820 Family history of epilepsy and other diseases of the nervous system: Secondary | ICD-10-CM | POA: Diagnosis not present

## 2019-10-31 DIAGNOSIS — Z811 Family history of alcohol abuse and dependence: Secondary | ICD-10-CM | POA: Diagnosis not present

## 2019-10-31 DIAGNOSIS — N183 Chronic kidney disease, stage 3 unspecified: Secondary | ICD-10-CM | POA: Diagnosis not present

## 2019-10-31 HISTORY — PX: BREAST EXCISIONAL BIOPSY: SUR124

## 2019-10-31 HISTORY — PX: BREAST LUMPECTOMY WITH RADIOACTIVE SEED LOCALIZATION: SHX6424

## 2019-10-31 SURGERY — BREAST LUMPECTOMY WITH RADIOACTIVE SEED LOCALIZATION
Anesthesia: General | Site: Breast | Laterality: Left

## 2019-10-31 MED ORDER — FENTANYL CITRATE (PF) 100 MCG/2ML IJ SOLN: 50.0000 ug | INTRAMUSCULAR | Status: DC | PRN

## 2019-10-31 MED ORDER — PROPOFOL 500 MG/50ML IV EMUL
INTRAVENOUS | Status: AC
  Filled 2019-10-31: qty 50

## 2019-10-31 MED ORDER — BUPIVACAINE-EPINEPHRINE (PF) 0.25% -1:200000 IJ SOLN
INTRAMUSCULAR | Status: DC | PRN
  Administered 2019-10-31: 20 mL

## 2019-10-31 MED ORDER — ONDANSETRON HCL 4 MG/2ML IJ SOLN
INTRAMUSCULAR | Status: DC | PRN
  Administered 2019-10-31: 4 mg via INTRAVENOUS

## 2019-10-31 MED ORDER — DEXAMETHASONE SODIUM PHOSPHATE 4 MG/ML IJ SOLN
INTRAMUSCULAR | Status: DC | PRN
  Administered 2019-10-31: 10 mg via INTRAVENOUS

## 2019-10-31 MED ORDER — EPHEDRINE 5 MG/ML INJ
INTRAVENOUS | Status: AC
  Filled 2019-10-31: qty 10

## 2019-10-31 MED ORDER — OXYCODONE HCL 5 MG PO TABS
ORAL_TABLET | ORAL | Status: AC
  Filled 2019-10-31: qty 1

## 2019-10-31 MED ORDER — GLYCOPYRROLATE PF 0.2 MG/ML IJ SOSY
PREFILLED_SYRINGE | INTRAMUSCULAR | Status: AC
  Filled 2019-10-31: qty 1

## 2019-10-31 MED ORDER — DEXAMETHASONE SODIUM PHOSPHATE 10 MG/ML IJ SOLN
INTRAMUSCULAR | Status: AC
  Filled 2019-10-31: qty 1

## 2019-10-31 MED ORDER — MEPERIDINE HCL 25 MG/ML IJ SOLN: 6.2500 mg | INTRAMUSCULAR | Status: DC | PRN

## 2019-10-31 MED ORDER — CHLORHEXIDINE GLUCONATE CLOTH 2 % EX PADS: 6.0000 | MEDICATED_PAD | Freq: Once | CUTANEOUS | Status: DC

## 2019-10-31 MED ORDER — CEFAZOLIN SODIUM-DEXTROSE 2-4 GM/100ML-% IV SOLN
INTRAVENOUS | Status: AC
  Filled 2019-10-31: qty 100

## 2019-10-31 MED ORDER — HYDROCODONE-ACETAMINOPHEN 5-325 MG PO TABS: 1.0000 | ORAL_TABLET | Freq: Four times a day (QID) | ORAL | 0 refills | Status: DC | PRN

## 2019-10-31 MED ORDER — ONDANSETRON HCL 4 MG/2ML IJ SOLN
INTRAMUSCULAR | Status: AC
  Filled 2019-10-31: qty 2

## 2019-10-31 MED ORDER — GABAPENTIN 300 MG PO CAPS
300.0000 mg | ORAL_CAPSULE | ORAL | Status: AC
  Administered 2019-10-31: 07:00:00 300 mg via ORAL

## 2019-10-31 MED ORDER — FENTANYL CITRATE (PF) 100 MCG/2ML IJ SOLN
INTRAMUSCULAR | Status: DC | PRN
  Administered 2019-10-31 (×2): 50 ug via INTRAVENOUS

## 2019-10-31 MED ORDER — LIDOCAINE 2% (20 MG/ML) 5 ML SYRINGE
INTRAMUSCULAR | Status: AC
  Filled 2019-10-31: qty 5

## 2019-10-31 MED ORDER — LACTATED RINGERS IV SOLN: INTRAVENOUS | Status: DC

## 2019-10-31 MED ORDER — OXYCODONE HCL 5 MG/5ML PO SOLN: 5.0000 mg | Freq: Once | ORAL | Status: AC | PRN

## 2019-10-31 MED ORDER — PHENYLEPHRINE 40 MCG/ML (10ML) SYRINGE FOR IV PUSH (FOR BLOOD PRESSURE SUPPORT)
PREFILLED_SYRINGE | INTRAVENOUS | Status: AC
  Filled 2019-10-31: qty 10

## 2019-10-31 MED ORDER — PROPOFOL 10 MG/ML IV BOLUS
INTRAVENOUS | Status: DC | PRN
  Administered 2019-10-31: 150 mg via INTRAVENOUS

## 2019-10-31 MED ORDER — ACETAMINOPHEN 500 MG PO TABS
ORAL_TABLET | ORAL | Status: AC
  Filled 2019-10-31: qty 2

## 2019-10-31 MED ORDER — FENTANYL CITRATE (PF) 100 MCG/2ML IJ SOLN: 25.0000 ug | INTRAMUSCULAR | Status: DC | PRN

## 2019-10-31 MED ORDER — GLYCOPYRROLATE 0.2 MG/ML IJ SOLN
INTRAMUSCULAR | Status: DC | PRN
  Administered 2019-10-31: .1 mg via INTRAVENOUS

## 2019-10-31 MED ORDER — EPHEDRINE SULFATE 50 MG/ML IJ SOLN
INTRAMUSCULAR | Status: DC | PRN
  Administered 2019-10-31: 10 mg via INTRAVENOUS

## 2019-10-31 MED ORDER — MIDAZOLAM HCL 2 MG/2ML IJ SOLN: 1.0000 mg | INTRAMUSCULAR | Status: DC | PRN

## 2019-10-31 MED ORDER — ACETAMINOPHEN 500 MG PO TABS
1000.0000 mg | ORAL_TABLET | ORAL | Status: AC
  Administered 2019-10-31: 1000 mg via ORAL

## 2019-10-31 MED ORDER — LIDOCAINE HCL (CARDIAC) PF 100 MG/5ML IV SOSY
PREFILLED_SYRINGE | INTRAVENOUS | Status: DC | PRN
  Administered 2019-10-31: 100 mg via INTRAVENOUS

## 2019-10-31 MED ORDER — OXYCODONE HCL 5 MG PO TABS
5.0000 mg | ORAL_TABLET | Freq: Once | ORAL | Status: AC | PRN
  Administered 2019-10-31: 10:00:00 5 mg via ORAL

## 2019-10-31 MED ORDER — CEFAZOLIN SODIUM-DEXTROSE 2-4 GM/100ML-% IV SOLN
2.0000 g | INTRAVENOUS | Status: AC
  Administered 2019-10-31: 08:00:00 1 g via INTRAVENOUS

## 2019-10-31 MED ORDER — ONDANSETRON HCL 4 MG/2ML IJ SOLN: 4.0000 mg | Freq: Once | INTRAMUSCULAR | Status: DC | PRN

## 2019-10-31 MED ORDER — PHENYLEPHRINE HCL (PRESSORS) 10 MG/ML IV SOLN
INTRAVENOUS | Status: DC | PRN
  Administered 2019-10-31 (×3): 80 ug via INTRAVENOUS

## 2019-10-31 MED ORDER — PROPOFOL 10 MG/ML IV BOLUS
INTRAVENOUS | Status: AC
  Filled 2019-10-31: qty 20

## 2019-10-31 MED ORDER — FENTANYL CITRATE (PF) 100 MCG/2ML IJ SOLN
INTRAMUSCULAR | Status: AC
  Filled 2019-10-31: qty 2

## 2019-10-31 MED ORDER — GABAPENTIN 300 MG PO CAPS
ORAL_CAPSULE | ORAL | Status: AC
  Filled 2019-10-31: qty 1

## 2019-10-31 SURGICAL SUPPLY — 51 items
ADH SKN CLS APL DERMABOND .7 (GAUZE/BANDAGES/DRESSINGS) ×1
APL PRP STRL LF DISP 70% ISPRP (MISCELLANEOUS) ×1
APPLIER CLIP 9.375 MED OPEN (MISCELLANEOUS)
APR CLP MED 9.3 20 MLT OPN (MISCELLANEOUS)
BINDER BREAST LRG (GAUZE/BANDAGES/DRESSINGS) IMPLANT
BINDER BREAST MEDIUM (GAUZE/BANDAGES/DRESSINGS) IMPLANT
BINDER BREAST XLRG (GAUZE/BANDAGES/DRESSINGS) ×2 IMPLANT
BINDER BREAST XXLRG (GAUZE/BANDAGES/DRESSINGS) IMPLANT
BLADE SURG 15 STRL LF DISP TIS (BLADE) ×1 IMPLANT
BLADE SURG 15 STRL SS (BLADE) ×3
CANISTER SUC SOCK COL 7IN (MISCELLANEOUS) IMPLANT
CANISTER SUCT 1200ML W/VALVE (MISCELLANEOUS) IMPLANT
CHLORAPREP W/TINT 26 (MISCELLANEOUS) ×3 IMPLANT
CLIP APPLIE 9.375 MED OPEN (MISCELLANEOUS) IMPLANT
COVER BACK TABLE 60X90IN (DRAPES) ×3 IMPLANT
COVER MAYO STAND STRL (DRAPES) ×3 IMPLANT
COVER PROBE W GEL 5X96 (DRAPES) ×3 IMPLANT
COVER WAND RF STERILE (DRAPES) IMPLANT
DECANTER SPIKE VIAL GLASS SM (MISCELLANEOUS) IMPLANT
DERMABOND ADVANCED (GAUZE/BANDAGES/DRESSINGS) ×2
DERMABOND ADVANCED .7 DNX12 (GAUZE/BANDAGES/DRESSINGS) ×1 IMPLANT
DRAPE LAPAROSCOPIC ABDOMINAL (DRAPES) IMPLANT
DRAPE LAPAROTOMY 100X72 PEDS (DRAPES) ×3 IMPLANT
DRAPE UTILITY XL STRL (DRAPES) ×3 IMPLANT
ELECT COATED BLADE 2.86 ST (ELECTRODE) ×3 IMPLANT
ELECT REM PT RETURN 9FT ADLT (ELECTROSURGICAL) ×3
ELECTRODE REM PT RTRN 9FT ADLT (ELECTROSURGICAL) ×1 IMPLANT
GLOVE BIOGEL PI IND STRL 8 (GLOVE) ×1 IMPLANT
GLOVE BIOGEL PI INDICATOR 8 (GLOVE) ×2
GLOVE ECLIPSE 8.0 STRL XLNG CF (GLOVE) ×3 IMPLANT
GOWN STRL REUS W/ TWL LRG LVL3 (GOWN DISPOSABLE) ×2 IMPLANT
GOWN STRL REUS W/TWL LRG LVL3 (GOWN DISPOSABLE) ×6
HEMOSTAT ARISTA ABSORB 3G PWDR (HEMOSTASIS) IMPLANT
HEMOSTAT SNOW SURGICEL 2X4 (HEMOSTASIS) IMPLANT
KIT MARKER MARGIN INK (KITS) ×3 IMPLANT
NDL HYPO 25X1 1.5 SAFETY (NEEDLE) ×1 IMPLANT
NEEDLE HYPO 25X1 1.5 SAFETY (NEEDLE) ×3 IMPLANT
NS IRRIG 1000ML POUR BTL (IV SOLUTION) ×3 IMPLANT
PACK BASIN DAY SURGERY FS (CUSTOM PROCEDURE TRAY) ×3 IMPLANT
PENCIL SMOKE EVACUATOR (MISCELLANEOUS) ×3 IMPLANT
SLEEVE SCD COMPRESS KNEE MED (MISCELLANEOUS) ×3 IMPLANT
SPONGE LAP 4X18 RFD (DISPOSABLE) ×3 IMPLANT
SUT MNCRL AB 4-0 PS2 18 (SUTURE) ×3 IMPLANT
SUT SILK 2 0 SH (SUTURE) IMPLANT
SUT VICRYL 3-0 CR8 SH (SUTURE) ×3 IMPLANT
SYR CONTROL 10ML LL (SYRINGE) ×3 IMPLANT
TOWEL GREEN STERILE FF (TOWEL DISPOSABLE) ×3 IMPLANT
TRAY FAXITRON CT DISP (TRAY / TRAY PROCEDURE) ×3 IMPLANT
TUBE CONNECTING 20'X1/4 (TUBING)
TUBE CONNECTING 20X1/4 (TUBING) IMPLANT
YANKAUER SUCT BULB TIP NO VENT (SUCTIONS) IMPLANT

## 2019-10-31 NOTE — Transfer of Care (Signed)
Immediate Anesthesia Transfer of Care Note  Patient: [name] [name]  Procedure(s) Performed: LEFT BREAST LUMPECTOMY WITH RADIOACTIVE SEED LOCALIZATION (Left Breast)  Patient Location: PACU  Anesthesia Type:General  Level of Consciousness: awake and patient cooperative  Airway & Oxygen Therapy: Patient Spontanous Breathing and Patient connected to nasal cannula oxygen  Post-op Assessment: Report given to RN and Post -op Vital signs reviewed and stable  Post vital signs: Reviewed and stable  Last Vitals:  Vitals Value Taken Time  BP    Temp    Pulse 73 10/31/19 0932  Resp 11 10/31/19 0932  SpO2 99 % 10/31/19 0932  Vitals shown include unvalidated device data.  Last Pain:  Vitals:   10/31/19 0706  TempSrc: Tympanic  PainSc: 0-No pain      Patients Stated Pain Goal: 4 ([id number])  Complications: No apparent anesthesia complications

## 2019-10-31 NOTE — Anesthesia Preprocedure Evaluation (Signed)
Anesthesia Evaluation  Patient identified by MRN, date of birth, ID band Patient awake    Reviewed: Allergy & Precautions, H&P , NPO status , Patient's Chart, lab work & pertinent test results  History of Anesthesia Complications (+) PONV and history of anesthetic complications  Airway Mallampati: II   Neck ROM: full    Dental  (+) Dental Advisory Given   Pulmonary pneumonia, resolved, former smoker,    breath sounds clear to auscultation       Cardiovascular hypertension, + Peripheral Vascular Disease  + Valvular Problems/Murmurs  Rhythm:regular Rate:Normal     Neuro/Psych  Headaches, PSYCHIATRIC DISORDERS Anxiety Depression  Neuromuscular disease    GI/Hepatic GERD  ,  Endo/Other    Renal/GU Renal InsufficiencyRenal disease     Musculoskeletal  (+) Arthritis , Fibromyalgia -  Abdominal   Peds  Hematology  (+) anemia ,   Anesthesia Other Findings   Reproductive/Obstetrics                             Anesthesia Physical  Anesthesia Plan  ASA: III  Anesthesia Plan: General   Post-op Pain Management:    Induction: Intravenous  PONV Risk Score and Plan: 4 or greater and Ondansetron, Dexamethasone and Treatment may vary due to age or medical condition  Airway Management Planned: LMA  Additional Equipment: None  Intra-op Plan:   Post-operative Plan: Extubation in OR  Informed Consent: I have reviewed the patients History and Physical, chart, labs and discussed the procedure including the risks, benefits and alternatives for the proposed anesthesia with the patient or authorized representative who has indicated his/her understanding and acceptance.     Dental advisory given  Plan Discussed with: CRNA  Anesthesia Plan Comments:         Anesthesia Quick Evaluation

## 2019-10-31 NOTE — Interval H&P Note (Signed)
History and Physical Interval Note:  10/31/2019 8:17 AM  [name] [name]  has presented today for surgery, with the diagnosis of LEFT BREAST PAPILLOMA.  The various methods of treatment have been discussed with the patient and family. After consideration of risks, benefits and other options for treatment, the patient has consented to  Procedure(s): LEFT BREAST LUMPECTOMY WITH RADIOACTIVE SEED LOCALIZATION (Left) as a surgical intervention.  The patient's history has been reviewed, patient examined, no change in status, stable for surgery.  I have reviewed the patient's chart and labs.  Questions were answered to the patient's satisfaction.     [place]

## 2019-10-31 NOTE — Anesthesia Procedure Notes (Signed)
Procedure Name: LMA Insertion Date/Time: 10/31/2019 8:44 AM Performed by: Marrianne Mood, CRNA Pre-anesthesia Checklist: Patient identified, Emergency Drugs available, Suction available, Patient being monitored and Timeout performed Patient Re-evaluated:Patient Re-evaluated prior to induction Oxygen Delivery Method: Circle system utilized Preoxygenation: Pre-oxygenation with 100% oxygen Induction Type: IV induction Ventilation: Mask ventilation without difficulty LMA: LMA inserted LMA Size: 4.0 Number of attempts: 1 Airway Equipment and Method: Bite block Placement Confirmation: positive ETCO2 Tube secured with: Tape Dental Injury: Teeth and Oropharynx as per pre-operative assessment

## 2019-10-31 NOTE — Discharge Instructions (Signed)
OXYCODONE 1 TAB GIVEN AT 1082m NO PAIN MED UNTIL 4:30    Post Anesthesia Home Care Instructions  Activity: Get plenty of rest for the remainder of the day. A responsible individual must stay with you for 24 hours following the procedure.  For the next 24 hours, DO NOT: -Drive a car -Paediatric nurse -Drink alcoholic beverages -Take any medication unless instructed by your physician -Make any legal decisions or sign important papers.  Meals: Start with liquid foods such as gelatin or soup. Progress to regular foods as tolerated. Avoid greasy, spicy, heavy foods. If nausea and/or vomiting occur, drink only clear liquids until the nausea and/or vomiting subsides. Call your physician if vomiting continues.  Special Instructions/Symptoms: Your throat may feel dry or sore from the anesthesia or the breathing tube placed in your throat during surgery. If this causes discomfort, gargle with warm salt water. The discomfort should disappear within 24 hours.  If you had a scopolamine patch placed behind your ear for the management of post- operative nausea and/or vomiting:  1. The medication in the patch is effective for 72 hours, after which it should be removed.  Wrap patch in a tissue and discard in the trash. Wash hands thoroughly with soap and water. 2. You may remove the patch earlier than 72 hours if you experience unpleasant side effects which may include dry mouth, dizziness or visual disturbances. 3. Avoid touching the patch. Wash your hands with soap and water after contact with the patch.     Post Anesthesia Home Care Instructions  Activity: Get plenty of rest for the remainder of the day. A responsible individual must stay with you for 24 hours following the procedure.  For the next 24 hours, DO NOT: -Drive a car -Paediatric nurse -Drink alcoholic beverages -Take any medication unless instructed by your physician -Make any legal decisions or sign important  papers.  Meals: Start with liquid foods such as gelatin or soup. Progress to regular foods as tolerated. Avoid greasy, spicy, heavy foods. If nausea and/or vomiting occur, drink only clear liquids until the nausea and/or vomiting subsides. Call your physician if vomiting continues.  Special Instructions/Symptoms: Your throat may feel dry or sore from the anesthesia or the breathing tube placed in your throat during surgery. If this causes discomfort, gargle with warm salt water. The discomfort should disappear within 24 hours.  If you had a scopolamine patch placed behind your ear for the management of post- operative nausea and/or vomiting:  1. The medication in the patch is effective for 72 hours, after which it should be removed.  Wrap patch in a tissue and discard in the trash. Wash hands thoroughly with soap and water. 2. You may remove the patch earlier than 72 hours if you experience unpleasant side effects which may include dry mouth, dizziness or visual disturbances. 3. Avoid touching the patch. Wash your hands with soap and water after contact with the patch.       [place] Office Phone Number [phone]  BREAST BIOPSY/ PARTIAL MASTECTOMY: POST OP INSTRUCTIONS  Always review your discharge instruction sheet given to you by the facility where your surgery was performed.  IF YOU HAVE DISABILITY OR FAMILY LEAVE FORMS, YOU MUST BRING THEM TO THE OFFICE FOR PROCESSING.  DO NOT GIVE THEM TO YOUR DOCTOR.  1. A prescription for pain medication may be given to you upon discharge.  Take your pain medication as prescribed, if needed.  If narcotic pain medicine is not  needed, then you may take acetaminophen (Tylenol) or ibuprofen (Advil) as needed. 2. Take your usually prescribed medications unless otherwise directed 3. If you need a refill on your pain medication, please contact your pharmacy.  They will contact our office to request authorization.  Prescriptions will  not be filled after 5pm or on week-ends. 4. You should eat very light the first 24 hours after surgery, such as soup, crackers, pudding, etc.  Resume your normal diet the day after surgery. 5. Most patients will experience some swelling and bruising in the breast.  Ice packs and a good support bra will help.  Swelling and bruising can take several days to resolve.  6. It is common to experience some constipation if taking pain medication after surgery.  Increasing fluid intake and taking a stool softener will usually help or prevent this problem from occurring.  A mild laxative (Milk of Magnesia or Miralax) should be taken according to package directions if there are no bowel movements after 48 hours. 7. Unless discharge instructions indicate otherwise, you may remove your bandages 24-48 hours after surgery, and you may shower at that time.  You may have steri-strips (small skin tapes) in place directly over the incision.  These strips should be left on the skin for 7-10 days.  If your surgeon used skin glue on the incision, you may shower in 24 hours.  The glue will flake off over the next 2-3 weeks.  Any sutures or staples will be removed at the office during your follow-up visit. 8. ACTIVITIES:  You may resume regular daily activities (gradually increasing) beginning the next day.  Wearing a good support bra or sports bra minimizes pain and swelling.  You may have sexual intercourse when it is comfortable. a. You may drive when you no longer are taking prescription pain medication, you can comfortably wear a seatbelt, and you can safely maneuver your car and apply brakes. b. RETURN TO WORK:  ______________________________________________________________________________________ 9. You should see your doctor in the office for a follow-up appointment approximately two weeks after your surgery.  Your doctor's nurse will typically make your follow-up appointment when she calls you with your pathology report.   Expect your pathology report 2-3 business days after your surgery.  You may call to check if you do not hear from [place] after three days. 10. OTHER INSTRUCTIONS: _______________________________________________________________________________________________ _____________________________________________________________________________________________________________________________________ _____________________________________________________________________________________________________________________________________ _____________________________________________________________________________________________________________________________________  WHEN TO CALL YOUR DOCTOR: 1. Fever over 101.0 2. Nausea and/or vomiting. 3. Extreme swelling or bruising. 4. Continued bleeding from incision. 5. Increased pain, redness, or drainage from the incision.  The clinic staff is available to answer your questions during regular business hours.  Please don't hesitate to call and ask to speak to one of the nurses for clinical concerns.  If you have a medical emergency, go to the nearest emergency room or call 911.  A surgeon from [facility] is always on call at the hospital.  For further questions, please visit [email or web address]

## 2019-10-31 NOTE — Op Note (Signed)
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  Core biopsy of papilloma.  Risk and benefits of excision were discussed with the patient.The procedure has been discussed with the patient. Alternatives to surgery have been discussed with the patient.  Risks of surgery include bleeding,  Infection,  Seroma formation, death,  and the need for further surgery.   The patient understands and wishes to proceed.  Description of procedure: The patient was met in the holding area.  Neoprobe used to check seed location left breast upper inner quadrant and films reviewed.  Left side marked as correct.  All questions were answered.  She was brought to the operative room placed supine upon the OR table.  After induction of general anesthesia, left breast was prepped and draped in sterile fashion timeout was performed.  Neoprobe was used to identify the seed in the left breast upper outer quadrant 2 cm from the nipple areolar complex.  Curvilinear incision was made around the nipple areolar complex.  Dissection was carried in superior fashion toward the signal.  All tissue around the seed and clip were excised with a grossly negative margin.  The superior margin was closed and additional tissue was taken as a separate specimen.  The Faxitron revealed the seed clip piece present specimen.  Hemostasis achieved.  Irrigation was suctioned out.  Wound closed with 3-0 Vicryl and 4-0 Monocryl.  Dermabond applied.  Breast binder placed.  All counts were found to be correct.  The patient was awoke extubated taken  recovery in satisfactory condition.

## 2019-10-31 NOTE — H&P (Signed)
Sela Hua  Location: [facility] Patient #: [id number] DOB: [birth year] Divorced / Language: [name] / Race: White Female   History of Present Illness Patient words: Patient sent at the request of Dr. Jimmye Norman of the [facility] due to abnormal screening mammogram. The patient had an area of abnormal density in her left breast upper outer quadrant. 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 She has no family history of breast cancer. She denies any history of breast pain, breast lump or nipple discharge.           CLINICAL DATA: The patient was called back for a possible mass with distortion in the left breast. EXAM: DIGITAL DIAGNOSTIC LEFT MAMMOGRAM WITH TOMO ULTRASOUND LEFT BREAST COMPARISON: Previous exam(s). ACR Breast Density Category c: The breast tissue is heterogeneously dense, which may obscure small masses. FINDINGS: There appear to be obscured masses in the lateral left breast. In the region of the obscured masses, possible distortion remains as seen on the whole paddle cc tomo view, image 44. On physical exam, no suspicious lumps are identified. Targeted ultrasound is performed, showing fibrocystic changes and dilated milk ducts. No suspicious masses. No axillary adenopathy. IMPRESSION: Possible distortion remains in the left breast, best seen on the CC view, tomographic image 44. Fibrocystic changes and dilated ducts are seen as well. No axillary adenopathy. RECOMMENDATION: Stereotactic biopsy of the possible left breast distortion. I have discussed the findings and recommendations with the patient. If applicable, a reminder letter will be sent to the patient regarding the next appointment. BI-RADS CATEGORY 4: Suspicious. Electronically Signed By: Dorise Bullion III M.D On: 08/27/2019 15:17  Result History  MM DIAG BREAST TOMO UNI LEFT (Order [id number]) on 08/27/2019 - Order Result History Report  <epic://[email or web address]/?[email or web address]&39>  US BREAST LTD UNI LEFT INC AXILLA (Order :[id number]) - Reflex for Order [id number] Study Result  CLINICAL DATA: The patient was called back for a possible mass with distortion in the left breast. EXAM: DIGITAL DIAGNOSTIC LEFT MAMMOGRAM WITH TOMO ULTRASOUND LEFT BREAST COMPARISON: Previous exam(s). ACR Breast Density Category c: The breast tissue is heterogeneously dense, which may obscure small masses. FINDINGS: There appear to be obscured masses in the lateral left breast. In the region of the obscured masses, possible distortion remains as seen on the whole paddle cc tomo view, image 44. On physical exam, no suspicious lumps are identified. Targeted ultrasound is performed, showing fibrocystic changes and dilated milk ducts. No suspicious masses. No axillary adenopathy. IMPRESSION: Possible distortion remains in the left breast, best seen on the CC view, tomographic image 44. Fibrocystic changes and dilated ducts are seen as well. No axillary adenopathy. RECOMMENDATION: Stereotactic biopsy of the possible left breast distortion. I have discussed the findings and recommendations with the patient. If applicable, a reminder letter will be sent to the patient regarding the next appointment. BI-RADS CATEGORY 4: Suspicious. Electronically Signed By: Dorise Bullion III M.D On: 08/27/2019 15:17  Result History  US BREAST LTD UNI LEFT Bruce Donath (Order [id number]) on 08/27/2019 - Order Result History Report <epic://[email or web address]/?[email or web address]&40>  MM 3D SCREEN BREAST BILATERAL (Order [id number]) Study Result  CLINICAL DATA: Screening. EXAM: DIGITAL SCREENING BILATERAL MAMMOGRAM WITH TOMO AND CAD COMPARISON: Previous exam(s). ACR Breast Density Category c: The breast tissue is heterogeneously dense, which may obscure small masses. FINDINGS: In the left breast, a possible mass with distortion warrants further evaluation. This possible mass with  architectural distortion is seen within the slightly outer LEFT breast, anterior depth, CC view only, slice 39. In the  right breast, no findings suspicious for malignancy. Images were processed with CAD. IMPRESSION: Further evaluation is suggested for possible mass with distortion in the left breast. RECOMMENDATION: Diagnostic mammogram and possibly ultrasound of the left breast. (Code:FI-[id number]) The patient will be contacted regarding the findings, and additional imaging will be scheduled. BI-RADS CATEGORY 0: Incomplete. Need additional imaging evaluation and/or prior mammograms for comparison. Electronically Signed By: Franki Cabot M.D. On: 08/22/2019 08:44  Result History  MM 3D SCREEN BREAST BILATERAL (Order [id number]) on 08/22/2019 - Order Result History Report <[email or web address]://[email or web address]/?[email or web address]&41> MyChart Results Release  MyChart Status: Active Results Release <[email or web address]://[email or web address]/?[email or web address]&42> Encounter-Level Documents - 08/27/2019:  Electronic signature on 08/27/2019 2:12 PM - E-signed <[email or web address]://[email or web address]/?[email or web address]&43> Electronic signature on 08/27/2019 2:12 PM - E-signed <[email or web address]://[email or web address]/?[email or web address]&44> Scan on 08/23/2019 2:56 PM by Default, Provider, MD <epic://[email or web address]&45>Scan on 08/23/2019 2:56 PM by Default, Provider, MD   Order-Level Documents:  There are no order-level documents. Vitals  Height Weight BMI (Calculated)  Assessment/Recommendation  Assessment Side Recommendation Suspicious [4] Left Biopsy Imaging  Imaging Information <epic://[email or web address]/?LINKID&46> Resulted by:  Signed Date/Time Phone Pager Mee Hives, [place] 08/27/2019 3:17 PM [id number] Original Order  Ordered On Ordered By 08/23/2019 9:47 AM <[email or web address]://[email or web address]/?LINKID&47> Donette Larry   MM DIAG BREAST TOMO UNI LEFT [id number] (Abnormal) Resulted: 08/27/19 1517, Result status: Final result Ordering provider: Howard Pouch A, DO 08/27/19 1413 Resulted by: York Ram,  MD Performed: 08/27/19 1417 - 08/27/19 1419 Accession number: [id number] Resulting lab: [facility] Narrative: CLINICAL DATA: The patient was called back for a possible mass with distortion in the left breast. EXAM: DIGITAL DIAGNOSTIC LEFT MAMMOGRAM WITH TOMO ULTRASOUND LEFT BREAST COMPARISON: Previous exam(s). ACR Breast Density Category c: The breast tissue is heterogeneously dense, which may obscure small masses. FINDINGS: There appear to be obscured masses in the lateral left breast. In the region of the obscured masses, possible distortion remains as seen on the whole paddle cc tomo view, image 44. On physical exam, no suspicious lumps are identified. Targeted ultrasound is performed, showing fibrocystic changes and dilated milk ducts. No suspicious masses. No axillary adenopathy. IMPRESSION: Possible distortion remains in the left breast, best seen on the CC view, tomographic image 44. Fibrocystic changes and dilated ducts are seen as well. No axillary adenopathy. RECOMMENDATION: Stereotactic biopsy of the possible left breast distortion. I have discussed the findings and recommendations with the patient. If applicable, a reminder letter will be sent to the patient regarding the next appointment. BI-RADS CATEGORY 4: Suspicious. Electronically Signed By: Dorise Bullion III M.D On: 08/27/2019 15:17            Diagnosis Breast, left, needle core biopsy, outer - INTRADUCTAL PAPILLOMA. - FIBROCYSTIC CHANGE AND SCLEROSING ADENOSIS. - NO MALIGNANCY IDENTIFIED.  The patient is a 74 year old female.   Past Surgical History  Colon Polyp Removal - Colonoscopy  Gallbladder Surgery - Laparoscopic  Hysterectomy (not due to cancer) - Complete  Oral Surgery  Spinal Surgery - Neck   Diagnostic Studies History  Colonoscopy  1-5 years ago Mammogram  within last year Pap Smear  >5 years ago  Allergies  Sulfa  Antibiotics  Morphine Derivatives  Allergies Reconciled   Medication History  LORazepam (0.5MG  Tablet, Oral) Active. traMADol HCl (50MG  Tablet, Oral) Active. Atenolol (25MG  Tablet, Oral) Active. Dicyclomine HCl (10MG  Capsule, Oral) Active. DULoxetine HCl (60MG  Capsule DR Part, Oral) Active. Escitalopram Oxalate (20MG  Tablet, Oral) Active. Losartan Potassium-HCTZ (50-12.5MG  Tablet, Oral) Active. Toviaz (8MG  Tablet ER 24HR, Oral) Active. traZODone HCl (100MG  Tablet, Oral) Active. Medications Reconciled  Social History  Alcohol use  Occasional alcohol use. Caffeine use  Carbonated beverages, Coffee, Tea. No drug use  Tobacco use  Current every day smoker.  Family History  Alcohol Abuse  Father. Arthritis  Brother, Father, Mother, Sister. Bleeding disorder  Brother, Mother, Sister. Cancer  Father. Cervical Cancer  Mother. Depression  Sister. Heart disease in female family member before age 68  Hypertension  Brother, Daughter, Father, Mother, Sister. Migraine Headache  Sister. Respiratory Condition  Father. Thyroid problems  Mother.  Pregnancy / Birth History  Age at menarche  33 years. Age of menopause  71-50 Gravida  2 Maternal age  26-25 Para  54  Other Problems  Anxiety Disorder  Arthritis  Bladder Problems  Depression  Gastroesophageal Reflux Disease  Hemorrhoids  High blood pressure  Hypercholesterolemia  Lump In Breast  Oophorectomy     Review of Systems  General Present- Night Sweats. Not Present- Appetite Loss, Chills, Fatigue, Fever, Weight Gain and Weight Loss. Skin Present- Dryness. Not Present- Change in Wart/Mole, Hives, Jaundice, New Lesions, Non-Healing Wounds, Rash and Ulcer. HEENT Present- Hoarseness, Sinus Pain and Wears glasses/contact lenses. Not Present- Earache, Hearing Loss, Nose Bleed, Oral Ulcers, Ringing in the Ears, Seasonal Allergies, Sore Throat, Visual Disturbances and Yellow  Eyes. Breast Present- Breast Mass. Not Present- Breast Pain, Nipple Discharge and Skin Changes. Cardiovascular Present- Leg Cramps. Not Present- Chest Pain, Difficulty Breathing Lying Down, Palpitations, Rapid Heart Rate, Shortness of Breath and Swelling of Extremities. Gastrointestinal Present- Constipation, Excessive gas and Hemorrhoids. Not Present- Abdominal Pain, Bloating, Bloody Stool, Change in Bowel Habits, Chronic diarrhea, Difficulty Swallowing, Gets full quickly at meals, Indigestion, Nausea, Rectal Pain and Vomiting. Female Genitourinary Present- Urgency. Not Present- Frequency, Nocturia, Painful Urination and Pelvic Pain. Musculoskeletal Present- Joint Pain and Joint Stiffness. Not Present- Back Pain, Muscle Pain, Muscle Weakness and Swelling of Extremities. Neurological Present- Numbness and Tingling. Not Present- Decreased Memory, Fainting, Headaches, Seizures, Tremor, Trouble walking and Weakness. Psychiatric Present- Anxiety and Depression. Not Present- Bipolar, Change in Sleep Pattern, Fearful and Frequent crying. Endocrine Not Present- Cold Intolerance, Excessive Hunger, Hair Changes, Heat Intolerance, Hot flashes and New Diabetes. Hematology Not Present- Blood Thinners, Easy Bruising, Excessive bleeding, Gland problems, HIV and Persistent Infections.  Vitals  09/21/2019 9:15 AM Weight: 195.4 lb Height: 65in Body Surface Area: 1.96 m Body Mass Index: 32.52 kg/m  Temp.: 98.53F  Pulse: 98 (Regular)  BP: 124/76 (Sitting, Left Arm, Standard)       Physical Exam  General Mental Status-Alert. General Appearance-Consistent with stated age. Hydration-Well hydrated. Voice-Normal.  Eye Eyeball - Bilateral-Extraocular movements intact. Sclera/Conjunctiva - Bilateral-No scleral icterus.  Chest and Lung Exam Note: Work of breathing normal   Breast Note: Mild bruising left breast upper outer quadrant with small hematoma. No other  masses. Right breast is normal.   Cardiovascular Note: Normal sinus rhythm   Neurologic Neurologic evaluation reveals -alert and oriented x 3 with no impairment of recent or remote memory. Mental Status-Normal.  Musculoskeletal Normal Exam - Left-Upper Extremity Strength Normal and Lower Extremity Strength Normal. Normal Exam - Right-Upper Extremity Strength Normal and Lower Extremity Strength Normal.  Lymphatic Head & Neck  General Head & Neck Lymphatics: Bilateral - Description - Normal. Axillary  General Axillary Region: Bilateral - Description - Normal. Tenderness - Non Tender.    Assessment & Plan  PAPILLOMA OF LEFT BREAST (D24.2) Impression: Recommend left breast lumpectomy due to potential upgrade risk of 20% to malignant. Observation other option with close follow-up. Patient opted for  left breast lumpectomy was seed localization. Risk of lumpectomy include bleeding, infection, seroma, more surgery, use of seed/wire, wound care, cosmetic deformity and the need for other treatments, death , blood clots, death. Pt agrees to proceed. Current Plans You are being scheduled for surgery- Our schedulers will call you.  You should hear from our office's scheduling department within 5 working days about the location, date, and time of surgery. We try to make accommodations for patient's preferences in scheduling surgery, but sometimes the OR schedule or the surgeon's schedule prevents [place] from making those accommodations.  If you have not heard from our office [phone]) in 5 working days, call the office and ask for your surgeon's nurse.  If you have other questions about your diagnosis, plan, or surgery, call the office and ask for your surgeon's nurse.  Pt Education - Pamphlet Given - Breast Biopsy: discussed with patient and provided information.

## 2019-10-31 NOTE — Anesthesia Postprocedure Evaluation (Signed)
Anesthesia Post Note  Patient: [name] [name]  Procedure(s) Performed: LEFT BREAST LUMPECTOMY WITH RADIOACTIVE SEED LOCALIZATION (Left Breast)     Patient location during evaluation: PACU Anesthesia Type: General Level of consciousness: sedated and patient cooperative Pain management: pain level controlled Vital Signs Assessment: post-procedure vital signs reviewed and stable Respiratory status: spontaneous breathing Cardiovascular status: stable Anesthetic complications: no    Last Vitals:  Vitals:   10/31/19 1000 10/31/19 1048  BP:  (!) 158/83  Pulse: 65 65  Resp: 13 16  Temp:  36.4 C  SpO2: 95%     Last Pain:  Vitals:   10/31/19 1048  TempSrc: Oral  PainSc: [place]

## 2019-11-01 ENCOUNTER — Encounter: Payer: Self-pay | Admitting: *Deleted

## 2019-11-01 LAB — SURGICAL PATHOLOGY

## 2019-11-09 ENCOUNTER — Telehealth: Payer: Self-pay

## 2019-11-09 NOTE — Telephone Encounter (Signed)
Please call regarding her meds.  She has several questions she said.  fesoterodine (TOVIAZ) 4 MG TB24 tablet  Patient number [phone]

## 2019-11-09 NOTE — Telephone Encounter (Signed)
Called patient back and she wanted to know what MG of Toviaz she is taking because she received a bill from her insurance company. She was advised to call her Urologist who RX's this medication for her, she verbalized understanding

## 2019-11-14 DIAGNOSIS — N3941 Urge incontinence: Secondary | ICD-10-CM | POA: Diagnosis not present

## 2019-11-27 DIAGNOSIS — N3020 Other chronic cystitis without hematuria: Secondary | ICD-10-CM | POA: Diagnosis not present

## 2019-12-05 ENCOUNTER — Other Ambulatory Visit: Payer: Self-pay

## 2019-12-05 ENCOUNTER — Ambulatory Visit (INDEPENDENT_AMBULATORY_CARE_PROVIDER_SITE_OTHER): Payer: Medicare HMO | Admitting: Family Medicine

## 2019-12-05 ENCOUNTER — Encounter: Payer: Self-pay | Admitting: Family Medicine

## 2019-12-05 VITALS — Ht 65.5 in

## 2019-12-05 DIAGNOSIS — N6092 Unspecified benign mammary dysplasia of left breast: Secondary | ICD-10-CM

## 2019-12-05 DIAGNOSIS — R32 Unspecified urinary incontinence: Secondary | ICD-10-CM | POA: Diagnosis not present

## 2019-12-05 NOTE — Progress Notes (Signed)
VIRTUAL VISIT VIA VIDEO  I connected with [name] [name] on 12/06/19 at  1:00 PM EST by a video enabled telemedicine application and verified that I am speaking with the correct person using two identifiers. Location patient: Home Location provider: [facility] Persons participating in the virtual visit: Patient, Dr. Raoul Pitch and R.Baker, LPN  I discussed the limitations of evaluation and management by telemedicine and the availability of in person appointments. The patient expressed understanding and agreed to proceed.  [name] [name] , 1946/08/03, 74 y.o., female MRN: [id number] Patient Care Team    Relationship Specialty Notifications Start End  Ma Hillock, DO PCP - General Family Medicine  06/02/15   Gatha Mayer, MD Consulting Physician Gastroenterology  11/24/15   Calvert Cantor, MD Consulting Physician Ophthalmology  11/24/15   Specialists, [facility]  Orthopedic Surgery  04/12/16   Pa, [facility]    01/16/18   Rheumatology, [facility]    01/16/18   Kathrynn Ducking, MD Consulting Physician Neurology  10/24/18     Chief Complaint  Patient presents with  . Breast surgery opinion    Pt would like opinion on breast surgery and medication she is supposed to start, Tamoxifen. She would be on this medication for 5 yrs and has side effects.      Subjective: Pt presents for an OV to discuss her recent breast biopsy and path report.  She was diagnosed atypical lobular hyperplasia of her left breast.  She states the doctor told her she had about an 8% chance of developing breast cancer and recommended she be placed on tamoxifen.  She would like to discuss this medication today and get my opinion on its use.  She also reports she had a bladder stimulator placed 3 weeks ago and it is helping her a great deal.  Depression screen [facility] 2/9 10/10/2019 04/24/2019 01/23/2019 01/19/2019 10/24/2018  Decreased Interest 0 0 1 0 2  Down, Depressed, Hopeless  0 0 0 1 1  PHQ - 2 Score 0 0 1 1 3   Altered sleeping 0 0 0 0 3  Tired, decreased energy 1 3 1 3 3   Change in appetite 0 3 0 0 2  Feeling bad or failure about yourself  0 0 0 0 0  Trouble concentrating 0 0 0 0 1  Moving slowly or fidgety/restless 0 0 0 0 0  Suicidal thoughts 0 0 0 0 1  PHQ-9 Score 1 6 2 4 13   Difficult doing work/chores Not difficult at all - Not difficult at all Not difficult at all Not difficult at all  Some recent data might be hidden    Allergies  Allergen Reactions  . Meloxicam Swelling    Caused edema and Cr increase  . Morphine Nausea And Vomiting    SEVERE  . Sulfa Antibiotics Swelling   Social History   Social History Narrative   She is retired and divorced and has 1 child   Occasional alcohol, former smoker no drug use   Right handed    Caffeine 2-3 cups per day   Lives at alone    Past Medical History:  Diagnosis Date  . Anxiety   . Arthritis    oa  . Chronic cystitis   . 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 DDD (degenerative disc disease), cervical   . Depression   . Fibromyalgia   . GERD (gastroesophageal reflux disease)   .  Headache    sinus  . Heart murmur   . History of duodenal ulcer    2007  . History of gastritis    2007  . HTN (hypertension)   . Hyperlipidemia   . Internal and external hemorrhoids without complication   . Intrinsic (urethral) sphincter deficiency (ISD)   . Irritable bowel syndrome   . Osteopenia   . Pernicious anemia   . Pneumonia   . PONV (postoperative nausea and vomiting)   . Unspecified essential hypertension   . Unspecified venous (peripheral) insufficiency    wears compression hose  . Unspecified vitamin D deficiency   . Urine incontinence    Past Surgical History:  Procedure Laterality Date  . ANTERIOR AND POSTERIOR VAGINAL REPAIR  04-29-2004   AND TRANSVAGINAL TAPE SLING  . ANTERIOR CERVICAL DECOMP/DISCECTOMY FUSION  1999  . APPENDECTOMY  1984  . BILATERAL  SALPINGOOPHORECTOMY  1984  . Bladder stimulator    . BREAST LUMPECTOMY WITH RADIOACTIVE SEED LOCALIZATION Left 10/31/2019   Procedure: LEFT BREAST LUMPECTOMY WITH RADIOACTIVE SEED LOCALIZATION;  Surgeon: Erroll Luna, MD;  Location: [facility];  Service: General;  Laterality: Left;  . CARDIAC CATHETERIZATION  10-03-2002   DR DEGENT   NORMAL CORONARIES ARTERIES/  EF 55%  . CYSTOSCOPY N/A 08/27/2013   Procedure: CYSTOSCOPY;  Surgeon: Ailene Rud, MD;  Location: [facility];  Service: Urology;  Laterality: N/A;  . CYSTOSCOPY WITH INJECTION N/A 12/17/2013   Procedure: Ascencion Dike WITH INJECTION;  Surgeon: Ailene Rud, MD;  Location: [facility];  Service: Urology;  Laterality: N/A;  . DILATION AND CURETTAGE OF UTERUS    . EXCISION RIGHT NECK AND LEFT BREAST SEBACEOUS CYST  05-18-2002  . HEMORRHOID BANDING  2014  . PUBOVAGINAL SLING N/A 10/25/2016   Procedure: Gaynelle Arabian;  Surgeon: Carolan Clines, MD;  Location: [facility];  Service: Urology;  Laterality: N/A;  . right litlle finger surgery  yrs ago   cyst removed x 4  . TOTAL ABDOMINAL HYSTERECTOMY  1986  . TUBAL LIGATION    . VAGINAL PROLAPSE REPAIR N/A 08/27/2013   Procedure: ANTERIOR VAGINAL VAULT SUSPENSION, KELLY PLICATION WITH SACROSPINOUS LIGAMENT FIXATION AND XENFORM BOVINE DERMIS GRAFT AUGMENTATION, URETHRAL EXPLORATION, URETHROLYSIS, EXPLANTATION OF TVT TAPE, IMPLANTATION OF FLOSEAL, IMPLANTATION OF XENFORM BOVINE GRAFT IN PERIURETHRAL SPACE;  Surgeon: Ailene Rud, MD;  Location: [place];  Service: Urology;  Laterality: N/A  . VAGINAL PROLAPSE REPAIR N/A 10/25/2016   Procedure: VAGINAL VAULT SUSPENSION with mesh and sacrospinous repair;  Surgeon: Carolan Clines, MD;  Location: [facility];  Service: Urology;  Laterality: N/A;   Family History  Problem Relation Age of Onset  . Lung cancer Father   . Coronary artery disease Father   .  Hypertension Mother   . Heart disease Mother   . Myelodysplastic syndrome Mother   . Arthritis Mother   . Cervical cancer Mother   . Dementia Maternal Aunt   . Fibromyalgia Sister   . Hypertension Sister   . Anemia Sister   . Depression Sister   . Hypertension Brother   . Anemia Brother   . Hypertension Brother   . Hyperlipidemia Brother   . Colon polyps Other        aunt  . Heart disease Maternal Uncle   . Lung cancer Paternal Uncle   . Kidney disease Sister        kidney stones, removed kidney  . Dementia Maternal Aunt   .  Lung cancer Maternal Uncle   . Other Paternal Uncle        brain Tumor  . Pancreatic cancer Cousin   . Colon cancer Neg Hx    Allergies as of 12/05/2019      Reactions   Meloxicam Swelling   Caused edema and Cr increase   Morphine Nausea And Vomiting   SEVERE   Sulfa Antibiotics Swelling      Medication List       Accurate as of December 05, 2019 11:59 PM. If you have any questions, ask your nurse or doctor.        atenolol 25 MG tablet Commonly known as: TENORMIN Take 1 tablet (25 mg total) by mouth every morning.   calcium carbonate 600 MG Tabs tablet Commonly known as: OS-CAL Take 600 mg by mouth daily.   cetirizine 10 MG tablet Commonly known as: ZYRTEC Take 10 mg by mouth daily as needed for allergies.   cholecalciferol 1000 units tablet Commonly known as: VITAMIN D Take 2 tablets (2,000 Units total) by mouth daily.   clotrimazole 1 % cream Commonly known as: LOTRIMIN Apply 1 application topically 2 (two) times daily. To affected area   dicyclomine 10 MG capsule Commonly known as: BENTYL TAKE 1 CAPSULE FOUR TIMES DAILY BEFORE MEALS AND AT BEDTIME   DULoxetine 60 MG capsule Commonly known as: CYMBALTA Take 1 capsule (60 mg total) by mouth 2 (two) times daily.   escitalopram 20 MG tablet Commonly known as: LEXAPRO Take 1 tablet (20 mg total) by mouth daily.   ESTRACE VAGINAL 0.1 MG/GM vaginal cream Generic drug:  estradiol Estrace 0.01% (0.1 mg/gram) vaginal cream  1 gm q hs x14 then 1/2 gm 2x/wk   FISH OIL PO Take 1 capsule by mouth daily.   fluticasone 50 MCG/ACT nasal spray Commonly known as: FLONASE Place into both nostrils as needed.   HYDROcodone-acetaminophen 5-325 MG tablet Commonly known as: NORCO/VICODIN Take 1 tablet by mouth every 6 (six) hours as needed for moderate pain.   LORazepam 0.5 MG tablet Commonly known as: ATIVAN Take 1 tablet (0.5 mg total) by mouth every 12 (twelve) hours as needed for anxiety (for panic attack).   losartan-hydrochlorothiazide 50-12.5 MG tablet Commonly known as: HYZAAR Take 1 tablet by mouth daily.   omeprazole 40 MG capsule Commonly known as: PRILOSEC Take 1 capsule (40 mg total) by mouth 2 (two) times daily before a meal. 30 mins before breakfast and supper   oxybutynin 10 MG 24 hr tablet Commonly known as: DITROPAN-XL Take 10 mg by mouth daily.   pravastatin 40 MG tablet Commonly known as: PRAVACHOL Take 1 tablet (40 mg total) by mouth daily.   RECLAST IV Inject into the vein.   Toviaz 4 MG Tb24 tablet Generic drug: fesoterodine Take 4 mg by mouth daily.   traMADol 50 MG tablet Commonly known as: ULTRAM Take 1 tablet (50 mg total) by mouth 2 (two) times daily as needed for moderate pain. TAKES 1 AT BEDTIME AND 1 DURING THE DAY ONLY IF NEEDED   traZODone 100 MG tablet Commonly known as: DESYREL Take 1 tablet (100 mg total) by mouth at bedtime.   Turmeric Curcumin 500 MG Caps Take 1,000 mg by mouth daily.   Vitamin B-12 1000 MCG Subl Place 1 tablet under the tongue daily.   WOMENS MULTIVITAMIN PLUS PO Take 1 tablet by mouth daily.       All past medical history, surgical history, allergies, family history, immunizations andmedications were updated  in the EMR today and reviewed under the history and medication portions of their EMR.     ROS: Negative, with the exception of above mentioned in HPI   Objective:  Ht 5'  5.5" (1.664 m)   BMI 32.15 kg/m  Body mass index is 32.15 kg/m. Gen: Afebrile. No acute distress. Nontoxic in appearance, well developed, well nourished.  HENT: AT. [place]. Chronic hoarseness present.  Eyes:Pupils Equal Round Reactive to light, Extraocular movements intact,  Conjunctiva without redness, discharge or icterus. Psych: Normal affect, dress and demeanor. Normal speech. Normal thought content and judgment.  No exam data present No results found. No results found for this or any previous visit (from the past 24 hour(s)).  Assessment/Plan: [name] [name] is a 74 y.o. female present for OV for  Urinary incontinence, unspecified type Patient now has bladder stimulator in place and reports she is doing much better.  Atypical lobular hyperplasia (ALH) of left breast Lengthy discussion today surrounding her diagnosis of atypical lobular hyperplasia.  Reviewed notes available in the system from specialty teams and pathology report with her today. Discussed risk of breast cancer both in her left breast and potentially bilateral cancer risk. Encouraged her to speak with her specialty teams surrounding the tamoxifen and her concerns.  However I did reassure her use of tamoxifen is common and typically well-tolerated, although it can cause some hot flashes. She appreciated this provider's opinion and the time to answer questions that she had.   Reviewed expectations re: course of current medical issues.  Discussed self-management of symptoms.  Outlined signs and symptoms indicating need for more acute intervention.  Patient verbalized understanding and all questions were answered.  Patient received an After-Visit Summary.    Note is dictated utilizing voice recognition software. Although note has been proof read prior to signing, occasional typographical errors still can be missed. If any questions arise, please do not hesitate to call for verification.   electronically signed  by:  Howard Pouch, DO  [place]

## 2019-12-06 ENCOUNTER — Encounter: Payer: Self-pay | Admitting: Family Medicine

## 2019-12-06 DIAGNOSIS — N6092 Unspecified benign mammary dysplasia of left breast: Secondary | ICD-10-CM

## 2019-12-06 HISTORY — DX: Unspecified benign mammary dysplasia of left breast: N60.92

## 2019-12-11 DIAGNOSIS — H40013 Open angle with borderline findings, low risk, bilateral: Secondary | ICD-10-CM | POA: Diagnosis not present

## 2019-12-11 DIAGNOSIS — H26491 Other secondary cataract, right eye: Secondary | ICD-10-CM | POA: Diagnosis not present

## 2019-12-11 DIAGNOSIS — H43813 Vitreous degeneration, bilateral: Secondary | ICD-10-CM | POA: Diagnosis not present

## 2019-12-11 DIAGNOSIS — Z961 Presence of intraocular lens: Secondary | ICD-10-CM | POA: Diagnosis not present

## 2019-12-11 DIAGNOSIS — H17823 Peripheral opacity of cornea, bilateral: Secondary | ICD-10-CM | POA: Diagnosis not present

## 2019-12-24 DIAGNOSIS — H43813 Vitreous degeneration, bilateral: Secondary | ICD-10-CM | POA: Diagnosis not present

## 2019-12-24 DIAGNOSIS — H26491 Other secondary cataract, right eye: Secondary | ICD-10-CM | POA: Diagnosis not present

## 2019-12-24 DIAGNOSIS — Z961 Presence of intraocular lens: Secondary | ICD-10-CM | POA: Diagnosis not present

## 2019-12-24 DIAGNOSIS — H17823 Peripheral opacity of cornea, bilateral: Secondary | ICD-10-CM | POA: Diagnosis not present

## 2020-01-08 ENCOUNTER — Telehealth: Payer: Self-pay

## 2020-01-08 NOTE — Telephone Encounter (Signed)
Patient was exposed to [place] over Easter weekend.  Patient has completed 1st round of COVID vaccine. Due for 2nd vaccine next week. Patient was to sit with 73 year-old Aunt.  She is asking if she should go into quarantine and have someone else sit with her Aunt.  Please call patient at [phone].

## 2020-01-08 NOTE — Telephone Encounter (Signed)
I would encouraged her to complete a covid test. Please give her the [facility] information to schedule or she can schedule on her own. This will guide her on the best answer to her question.

## 2020-01-08 NOTE — Telephone Encounter (Signed)
Pt was called and said was sitting at dinner Sunday evening and the person she was sitting next to tested positive this morning. Pts second vaccine is scheduled 01/17/20. Pt is not having any symptoms at this time.   She would like advise on how long to quarantine and if she can have her 2nd COVID vaccine on 01/17/2020.

## 2020-01-08 NOTE — Telephone Encounter (Signed)
Pt was called and told to get COVID testing at [facility] or [facility]. Pt would [facility] because she [place] road from there. She is going to call and get scheduled.

## 2020-01-09 DIAGNOSIS — Z20828 Contact with and (suspected) exposure to other viral communicable diseases: Secondary | ICD-10-CM | POA: Diagnosis not present

## 2020-01-09 DIAGNOSIS — Z03818 Encounter for observation for suspected exposure to other biological agents ruled out: Secondary | ICD-10-CM | POA: Diagnosis not present

## 2020-01-14 DIAGNOSIS — M810 Age-related osteoporosis without current pathological fracture: Secondary | ICD-10-CM | POA: Diagnosis not present

## 2020-01-16 DIAGNOSIS — M810 Age-related osteoporosis without current pathological fracture: Secondary | ICD-10-CM | POA: Diagnosis not present

## 2020-01-24 DIAGNOSIS — H5213 Myopia, bilateral: Secondary | ICD-10-CM | POA: Diagnosis not present

## 2020-01-24 DIAGNOSIS — H524 Presbyopia: Secondary | ICD-10-CM | POA: Diagnosis not present

## 2020-01-24 DIAGNOSIS — H52209 Unspecified astigmatism, unspecified eye: Secondary | ICD-10-CM | POA: Diagnosis not present

## 2020-03-10 DIAGNOSIS — Z23 Encounter for immunization: Secondary | ICD-10-CM | POA: Diagnosis not present

## 2020-03-12 DIAGNOSIS — N3020 Other chronic cystitis without hematuria: Secondary | ICD-10-CM | POA: Diagnosis not present

## 2020-04-01 DIAGNOSIS — M810 Age-related osteoporosis without current pathological fracture: Secondary | ICD-10-CM | POA: Diagnosis not present

## 2020-04-17 ENCOUNTER — Other Ambulatory Visit: Payer: Self-pay | Admitting: Family Medicine

## 2020-04-17 DIAGNOSIS — G4700 Insomnia, unspecified: Secondary | ICD-10-CM

## 2020-04-17 DIAGNOSIS — F418 Other specified anxiety disorders: Secondary | ICD-10-CM

## 2020-04-22 ENCOUNTER — Telehealth: Payer: Self-pay

## 2020-04-22 DIAGNOSIS — F410 Panic disorder [episodic paroxysmal anxiety] without agoraphobia: Secondary | ICD-10-CM

## 2020-04-23 NOTE — Telephone Encounter (Signed)
Tramadol is also a controlled substance, law requires face to face to encounter for refills.  May double book 8:00 am slot Thursday 04/24/2020 (tomorrow) for her to cover [facility] (instead of 8/2)

## 2020-04-23 NOTE — Telephone Encounter (Signed)
She is aware she will not get ativan until she is seen in office.

## 2020-04-23 NOTE — Telephone Encounter (Signed)
Received refill request for Ativan.   Please call patient and schedule her for her 13-month chronic medical condition follow-up which was due in June. She will likely start to need refills on all of her medications since appointment is overdue. However since Ativan is a controlled substance, I cannot refill this medication until she is seen face-to-face.  Thanks.

## 2020-04-23 NOTE — Telephone Encounter (Signed)
Called patient she placed me on hold for several minutes. Had to disconnect call to help patient in office. Will call back later.

## 2020-04-23 NOTE — Telephone Encounter (Signed)
Pt is scheduled for 8/2 as it was the first available. Is in need of the tramadol and she will run out. Can this be filled until her 8/2 apt date?

## 2020-04-23 NOTE — Telephone Encounter (Signed)
Have called patient she is able to come in the am. Will have added to schedule.

## 2020-04-24 ENCOUNTER — Encounter: Payer: Self-pay | Admitting: Family Medicine

## 2020-04-24 ENCOUNTER — Other Ambulatory Visit: Payer: Self-pay

## 2020-04-24 ENCOUNTER — Ambulatory Visit (INDEPENDENT_AMBULATORY_CARE_PROVIDER_SITE_OTHER): Payer: Medicare HMO | Admitting: Family Medicine

## 2020-04-24 DIAGNOSIS — M19079 Primary osteoarthritis, unspecified ankle and foot: Secondary | ICD-10-CM

## 2020-04-24 DIAGNOSIS — M19041 Primary osteoarthritis, right hand: Secondary | ICD-10-CM

## 2020-04-24 DIAGNOSIS — M19042 Primary osteoarthritis, left hand: Secondary | ICD-10-CM

## 2020-04-24 DIAGNOSIS — M1712 Unilateral primary osteoarthritis, left knee: Secondary | ICD-10-CM | POA: Diagnosis not present

## 2020-04-24 DIAGNOSIS — E781 Pure hyperglyceridemia: Secondary | ICD-10-CM | POA: Diagnosis not present

## 2020-04-24 DIAGNOSIS — F410 Panic disorder [episodic paroxysmal anxiety] without agoraphobia: Secondary | ICD-10-CM

## 2020-04-24 DIAGNOSIS — I10 Essential (primary) hypertension: Secondary | ICD-10-CM | POA: Diagnosis not present

## 2020-04-24 DIAGNOSIS — G4700 Insomnia, unspecified: Secondary | ICD-10-CM

## 2020-04-24 DIAGNOSIS — F418 Other specified anxiety disorders: Secondary | ICD-10-CM | POA: Diagnosis not present

## 2020-04-24 MED ORDER — DICYCLOMINE HCL 10 MG PO CAPS: ORAL_CAPSULE | ORAL | 1 refills | Status: DC

## 2020-04-24 MED ORDER — LORAZEPAM 0.5 MG PO TABS: 0.5000 mg | ORAL_TABLET | Freq: Two times a day (BID) | ORAL | 5 refills | Status: DC | PRN

## 2020-04-24 MED ORDER — LOSARTAN POTASSIUM-HCTZ 50-12.5 MG PO TABS: 1.0000 | ORAL_TABLET | Freq: Every day | ORAL | 1 refills | Status: DC

## 2020-04-24 MED ORDER — ATENOLOL 25 MG PO TABS: 25.0000 mg | ORAL_TABLET | Freq: Every morning | ORAL | 1 refills | Status: DC

## 2020-04-24 MED ORDER — ESCITALOPRAM OXALATE 20 MG PO TABS: 20.0000 mg | ORAL_TABLET | Freq: Every day | ORAL | 1 refills | Status: DC

## 2020-04-24 MED ORDER — OMEPRAZOLE 40 MG PO CPDR: 40.0000 mg | DELAYED_RELEASE_CAPSULE | Freq: Two times a day (BID) | ORAL | 3 refills | Status: DC

## 2020-04-24 MED ORDER — TRAZODONE HCL 100 MG PO TABS: 100.0000 mg | ORAL_TABLET | Freq: Every day | ORAL | 1 refills | Status: DC

## 2020-04-24 MED ORDER — PRAVASTATIN SODIUM 40 MG PO TABS: 40.0000 mg | ORAL_TABLET | Freq: Every day | ORAL | 3 refills | Status: DC

## 2020-04-24 MED ORDER — TRAMADOL HCL 50 MG PO TABS: 50.0000 mg | ORAL_TABLET | Freq: Two times a day (BID) | ORAL | 5 refills | Status: DC | PRN

## 2020-04-24 MED ORDER — DULOXETINE HCL 60 MG PO CPEP: 60.0000 mg | ORAL_CAPSULE | Freq: Two times a day (BID) | ORAL | 1 refills | Status: DC

## 2020-04-24 NOTE — Patient Instructions (Addendum)
Great to see you today.  I have refilled your meds.  Try taking the trazodone earlier (such as 10-11 pm) Followup in 5.5 mos.

## 2020-04-24 NOTE — Progress Notes (Signed)
[name] [name] , 1945-12-22, 74 y.o., female MRN: [id number] Patient Care Team    Relationship Specialty Notifications Start End  Ma Hillock, DO PCP - General Family Medicine  06/02/15   Gatha Mayer, MD Consulting Physician Gastroenterology  11/24/15   Calvert Cantor, MD Consulting Physician Ophthalmology  11/24/15   Specialists, [facility]  Orthopedic Surgery  04/12/16   Pa, [facility]    01/16/18   Rheumatology, [facility]    01/16/18   Kathrynn Ducking, MD Consulting Physician Neurology  10/24/18     Chief Complaint  Patient presents with  . Medication Refill    Subjective: [name] [name] is a 74 y.o. female present for [facility] Hypertension/hyperlipidemia/CKD3:  Pt reports complaince with Pravastatin 40 mg, losartan-HCTZ 50-12.5 mg daily mg and atenolol 12.5 mg. Patient denies chest pain, shortness of breath, dizziness or lower extremity edema.  She does endorse palpitations that occur mostly when she "has herself worked up" or anxious. Pt does not take daily baby ASA. Pt is  prescribed statin. Labs UTD Diet: Low-sodium Exercise: Attempts RF: Hypertension, hyperlipidemia. Family history of heart disease, former smoker, obesity  Anxiety/panic attack/insomnia: Patient reports compliance with lexapro 20 mg daily, Cymbalta twice a day, trazodone 100 mg nightly.. She had been on zoloft and tapered off when lexapro started. Cymbalta has been at maximum dose and likely chosen secondary to her lumbar/arthritis issues.   She does take  Ativan twice daily.    Arthritis/pain/lumbar pain with radiation down right leg: Patient reports compliance  with tramadol 50 mg twice a day when necessary for arthritic pain. She is in need of refills today.She complains of low back and right leg pain throughout the day as well. She has h/o anterior cervical decompression/discectomy in the past. Indication for chronic opioid: Moderate to severe arthritis and osteoporosis  discomfort Medication and dose: Tramadol 50 mg twice daily as needed # pills per: #60 Last UDS date: today collected today Pain contract signed (Y/N): Yes Date narcotic database last reviewed (include red flags): 04/24/20    Depression screen [facility] 2/9 04/24/2020 10/10/2019 04/24/2019  Decreased Interest 1 0 0  Down, Depressed, Hopeless 1 0 0  PHQ - 2 Score 2 0 0  Altered sleeping 1 0 0  Tired, decreased energy 3 1 3   Change in appetite 0 0 3  Feeling bad or failure about yourself  0 0 0  Trouble concentrating 1 0 0  Moving slowly or fidgety/restless 0 0 0  Suicidal thoughts 0 0 0  PHQ-9 Score 7 1 6   Difficult doing work/chores Somewhat difficult Not difficult at all -  Some recent data might be hidden   GAD 7 : Generalized Anxiety Score 10/10/2019 04/24/2019 01/23/2019 01/19/2019  Nervous, Anxious, on Edge 1 2 1 1   Control/stop worrying 0 2 0 1  Worry too much - different things 0 2 3 1   Trouble relaxing 1 0 0 1  Restless 0 0 0 0  Easily annoyed or irritable 0 0 0 0  Afraid - awful might happen 1 0 1 1  Total GAD 7 Score 3 6 5 5   Anxiety Difficulty Not difficult at all - Not difficult at all Not difficult at all    Allergies  Allergen Reactions  . Meloxicam Swelling    Caused edema and Cr increase  . Morphine Nausea And Vomiting    SEVERE  . Other Other (See Comments)  . Sulfa Antibiotics Swelling   Social History  Tobacco Use  . Smoking status: Former Smoker    Packs/day: 1.00    Years: 15.00    Pack years: 15.00    Types: Cigarettes    Start date: 12/06/1993    Quit date: 08/09/2009    Years since quitting: 10.7  . Smokeless tobacco: Never Used  . Tobacco comment: quit smoking 5 years ago  Substance Use Topics  . Alcohol use: Yes    Comment: ocassional   Past Medical History:  Diagnosis Date  . Anxiety   . Arthritis    oa  . Chronic cystitis   . 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 DDD (degenerative disc disease), cervical   .  Depression   . Fibromyalgia   . GERD (gastroesophageal reflux disease)   . Headache    sinus  . Heart murmur   . History of duodenal ulcer    2007  . History of gastritis    2007  . HTN (hypertension)   . Hyperlipidemia   . Internal and external hemorrhoids without complication   . Intrinsic (urethral) sphincter deficiency (ISD)   . Irritable bowel syndrome   . Osteopenia   . Pernicious anemia   . Pneumonia   . PONV (postoperative nausea and vomiting)   . Unspecified essential hypertension   . Unspecified venous (peripheral) insufficiency    wears compression hose  . Unspecified vitamin D deficiency   . Urine incontinence    Past Surgical History:  Procedure Laterality Date  . ANTERIOR AND POSTERIOR VAGINAL REPAIR  04-29-2004   AND TRANSVAGINAL TAPE SLING  . ANTERIOR CERVICAL DECOMP/DISCECTOMY FUSION  1999  . APPENDECTOMY  1984  . BILATERAL SALPINGOOPHORECTOMY  1984  . Bladder stimulator    . BREAST LUMPECTOMY WITH RADIOACTIVE SEED LOCALIZATION Left 10/31/2019   Procedure: LEFT BREAST LUMPECTOMY WITH RADIOACTIVE SEED LOCALIZATION;  Surgeon: Erroll Luna, MD;  Location: [place];  Service: General;  Laterality: Left;  . CARDIAC CATHETERIZATION  10-03-2002   DR DEGENT   NORMAL CORONARIES ARTERIES/  EF 55%  . CYSTOSCOPY N/A 08/27/2013   Procedure: CYSTOSCOPY;  Surgeon: Ailene Rud, MD;  Location: [facility];  Service: Urology;  Laterality: N/A;  . CYSTOSCOPY WITH INJECTION N/A 12/17/2013   Procedure: Ascencion Dike WITH INJECTION;  Surgeon: Ailene Rud, MD;  Location[facility];  Service: Urology;  Laterality: N/A;  . DILATION AND CURETTAGE OF UTERUS    . EXCISION RIGHT NECK AND LEFT BREAST SEBACEOUS CYST  05-18-2002  . HEMORRHOID BANDING  2014  . PUBOVAGINAL SLING N/A 10/25/2016   Procedure: Gaynelle Arabian;  Surgeon: Carolan Clines, MD;  Location: [facility];  Service: Urology;  Laterality: N/A;  .  right litlle finger surgery  yrs ago   cyst removed x 4  . TOTAL ABDOMINAL HYSTERECTOMY  1986  . TUBAL LIGATION    . VAGINAL PROLAPSE REPAIR N/A 08/27/2013   Procedure: ANTERIOR VAGINAL VAULT SUSPENSION, KELLY PLICATION WITH SACROSPINOUS LIGAMENT FIXATION AND XENFORM BOVINE DERMIS GRAFT AUGMENTATION, URETHRAL EXPLORATION, URETHROLYSIS, EXPLANTATION OF TVT TAPE, IMPLANTATION OF FLOSEAL, IMPLANTATION OF XENFORM BOVINE GRAFT IN PERIURETHRAL SPACE;  Surgeon: Ailene Rud, MD;  Location: [place];  Service: Urology;  Laterality: N/A  . VAGINAL PROLAPSE REPAIR N/A 10/25/2016   Procedure: VAGINAL VAULT SUSPENSION with mesh and sacrospinous repair;  Surgeon: Carolan Clines, MD;  Location: [facility];  Service: Urology;  Laterality: N/A;   Family History  Problem Relation Age of Onset  .  Lung cancer Father   . Coronary artery disease Father   . Hypertension Mother   . Heart disease Mother   . Myelodysplastic syndrome Mother   . Arthritis Mother   . Cervical cancer Mother   . Dementia Maternal Aunt   . Fibromyalgia Sister   . Hypertension Sister   . Anemia Sister   . Depression Sister   . Hypertension Brother   . Anemia Brother   . Hypertension Brother   . Hyperlipidemia Brother   . Colon polyps Other        aunt  . Heart disease Maternal Uncle   . Lung cancer Paternal Uncle   . Kidney disease Sister        kidney stones, removed kidney  . Dementia Maternal Aunt   . Lung cancer Maternal Uncle   . Other Paternal Uncle        brain Tumor  . Pancreatic cancer Cousin   . Colon cancer Neg Hx    Allergies as of 04/24/2020      Reactions   Meloxicam Swelling   Caused edema and Cr increase   Morphine Nausea And Vomiting   SEVERE   Other Other (See Comments)   Sulfa Antibiotics Swelling      Medication List       Accurate as of April 24, 2020  8:21 AM. If you have any questions, ask your nurse or doctor.        STOP taking these medications    HYDROcodone-acetaminophen 5-325 MG tablet Commonly known as: NORCO/VICODIN Stopped by: Howard Pouch, DO     TAKE these medications   atenolol 25 MG tablet Commonly known as: TENORMIN Take 1 tablet (25 mg total) by mouth every morning.   calcium carbonate 600 MG Tabs tablet Commonly known as: OS-CAL Take 600 mg by mouth daily.   cetirizine 10 MG tablet Commonly known as: ZYRTEC Take 10 mg by mouth daily as needed for allergies.   cholecalciferol 1000 units tablet Commonly known as: VITAMIN D Take 2 tablets (2,000 Units total) by mouth daily.   clotrimazole 1 % cream Commonly known as: LOTRIMIN Apply 1 application topically 2 (two) times daily. To affected area   dicyclomine 10 MG capsule Commonly known as: BENTYL TAKE 1 CAPSULE FOUR TIMES DAILY BEFORE MEALS AND AT BEDTIME   DULoxetine 60 MG capsule Commonly known as: CYMBALTA Take 1 capsule (60 mg total) by mouth 2 (two) times daily.   escitalopram 20 MG tablet Commonly known as: LEXAPRO Take 1 tablet (20 mg total) by mouth daily.   ESTRACE VAGINAL 0.1 MG/GM vaginal cream Generic drug: estradiol Estrace 0.01% (0.1 mg/gram) vaginal cream  1 gm q hs x14 then 1/2 gm 2x/wk   FISH OIL PO Take 1 capsule by mouth daily.   fluticasone 50 MCG/ACT nasal spray Commonly known as: FLONASE Place into both nostrils as needed.   LORazepam 0.5 MG tablet Commonly known as: ATIVAN Take 1 tablet (0.5 mg total) by mouth every 12 (twelve) hours as needed for anxiety (for panic attack).   losartan-hydrochlorothiazide 50-12.5 MG tablet Commonly known as: HYZAAR Take 1 tablet by mouth daily.   omeprazole 40 MG capsule Commonly known as: PRILOSEC Take 1 capsule (40 mg total) by mouth 2 (two) times daily before a meal. 30 mins before breakfast and supper   oxybutynin 10 MG 24 hr tablet Commonly known as: DITROPAN-XL Take 10 mg by mouth daily.   pravastatin 40 MG tablet Commonly known as: PRAVACHOL Take 1 tablet (  40 mg total)  by mouth daily.   RECLAST IV Inject into the vein.   Restasis 0.05 % ophthalmic emulsion Generic drug: cycloSPORINE   tamoxifen 20 MG tablet Commonly known as: NOLVADEX   Toviaz 4 MG Tb24 tablet Generic drug: fesoterodine Take 4 mg by mouth daily.   traMADol 50 MG tablet Commonly known as: ULTRAM Take 1 tablet (50 mg total) by mouth 2 (two) times daily as needed for moderate pain. TAKES 1 AT BEDTIME AND 1 DURING THE DAY ONLY IF NEEDED   traZODone 100 MG tablet Commonly known as: DESYREL Take 1 tablet (100 mg total) by mouth at bedtime.   Turmeric Curcumin 500 MG Caps Take 1,000 mg by mouth daily.   Vitamin B-12 1000 MCG Subl Place 1 tablet under the tongue daily.   Voltaren 1 % Gel Generic drug: diclofenac Sodium Voltaren 1 % topical gel  APPLY 2 GRAM TO THE AFFECTED AREA(S) BY TOPICAL ROUTE 4 TIMES PER DAY   WOMENS MULTIVITAMIN PLUS PO Take 1 tablet by mouth daily.       No results found for this or any previous visit (from the past 24 hour(s)). No results found.   ROS: Negative, with the exception of above mentioned in HPI   Objective:  BP 111/74   Pulse 99   Temp 98.6 F (37 C) (Temporal)   Ht 5' 5.5" (1.664 m)   Wt 196 lb (88.9 kg)   SpO2 99%   BMI 32.12 kg/m  Body mass index is 32.12 kg/m. Gen: Afebrile. No acute distress. Pleasant female.  HENT: AT. [place].  Eyes:Pupils Equal Round Reactive to light, Extraocular movements intact,  Conjunctiva without redness, discharge or icterus. Neck/lymp/endocrine: Supple,no lymphadenopathy, no thyromegaly CV: RRR no murmur, no edema, +2/4 P posterior tibialis pulses Chest: CTAB, no wheeze or crackles Abd: Soft. NTND. BS present. no Masses palpated.  Skin: no rashes, purpura or petechiae.  Neuro:  Normal gait. PERLA. EOMi. Alert. Oriented x3 Psych: Normal affect, dress and demeanor. Normal speech. Normal thought content and judgment.    Assessment/Plan: [name] [name] is a 74 y.o. female present for OV for   [name] [name] is a 74 y.o. female present for follow up  Depression with anxiety/insomnia/panic d/o/benzo use Stable.  - continue Lexapro 20 mg daily- refills provided today - continue Cymbalta 60 mg twice a day. - continue trazodone 100 mg daily at bedtime. - continue  Ativan provided for 6 months. NCCS database reviewed and appropriate 04/24/20  - UDS  UTD - Controlled substance contract signed. - pt declined psychology referral.    Hypertension/morbid obesity/hyperlipidemia/CKD3/palpitations: -stable.   - continue losartan-HCTZ - continue atenolol to 12.5 mg daily. If palpitations worsen, can return to atenolol 25 mg daily.   - continue statin - Continue low-sodium diet, exercise greater than 150 minutes a week.  Vitamin D deficiency/osteoporosis:  Continue supplement Arthritis/pain/lumbar pain: - Stable.  - continue  tramadol for chronic arthritic pain. Encouraged her to take tramadol every 12 hours daily (was only routinely taking once).  - UDS and urine tramadol UTD - contract up-to-date. - NCCS database reviewed 04/24/20 and appropriate.  - Follow-up in 5.5 months if needing prescription refilled.  F/U 5.5 months on [facility]   No orders of the defined types were placed in this encounter.  Meds ordered this encounter  Medications  . LORazepam (ATIVAN) 0.5 MG tablet    Sig: Take 1 tablet (0.5 mg total) by mouth every 12 (twelve) hours as needed for anxiety (  for panic attack).    Dispense:  60 tablet    Refill:  5  . traMADol (ULTRAM) 50 MG tablet    Sig: Take 1 tablet (50 mg total) by mouth 2 (two) times daily as needed for moderate pain. TAKES 1 AT BEDTIME AND 1 DURING THE DAY ONLY IF NEEDED    Dispense:  60 tablet    Refill:  5    NO refills.  . traZODone (DESYREL) 100 MG tablet    Sig: Take 1 tablet (100 mg total) by mouth at bedtime.    Dispense:  90 tablet    Refill:  1  . losartan-hydrochlorothiazide (HYZAAR) 50-12.5 MG tablet    Sig: Take 1 tablet by mouth  daily.    Dispense:  90 tablet    Refill:  1    Do not request refills.  Marland Kitchen omeprazole (PRILOSEC) 40 MG capsule    Sig: Take 1 capsule (40 mg total) by mouth 2 (two) times daily before a meal. 30 mins before breakfast and supper    Dispense:  180 capsule    Refill:  3  . pravastatin (PRAVACHOL) 40 MG tablet    Sig: Take 1 tablet (40 mg total) by mouth daily.    Dispense:  90 tablet    Refill:  3  . escitalopram (LEXAPRO) 20 MG tablet    Sig: Take 1 tablet (20 mg total) by mouth daily.    Dispense:  90 tablet    Refill:  1  . DULoxetine (CYMBALTA) 60 MG capsule    Sig: Take 1 capsule (60 mg total) by mouth 2 (two) times daily.    Dispense:  180 capsule    Refill:  1  . dicyclomine (BENTYL) 10 MG capsule    Sig: TAKE 1 CAPSULE FOUR TIMES DAILY BEFORE MEALS AND AT BEDTIME    Dispense:  120 capsule    Refill:  1    Do not request refills  . atenolol (TENORMIN) 25 MG tablet    Sig: Take 1 tablet (25 mg total) by mouth every morning.    Dispense:  90 tablet    Refill:  1   Referral Orders  No referral(s) requested today    electronically signed by:  Howard Pouch, DO  [place]

## 2020-04-30 ENCOUNTER — Telehealth: Payer: Self-pay

## 2020-04-30 NOTE — Telephone Encounter (Signed)
Client [facility] - Client Client Site [place] - Day Physician AA - PHYSICIAN, Verita Schneiders- MD Contact Type Call Who Is Calling Patient / Member / Family / Caregiver Caller Name [place] Phone Number [phone] Patient Name [name] [name] Patient DOB 1946/08/31 Call Type Message Only Information Provided Reason for Call Request for General Office Information Initial Comment Caller states she received a text that she had an appointment scheduled. Caller would like information about the appointment. Disp. Time Disposition Final User 04/30/2020 12:17:58 PM General Information Provided Yes Lezlie Octave Call Closed By: Lezlie Octave Transaction Date/Time: 04/30/2020 12:16:10 PM (ET)

## 2020-04-30 NOTE — Telephone Encounter (Signed)
Pt was called back and said that she was seen 04/24/2020 and got refills on meds. She needs to cancel 08/02 appt. This was cancelled.

## 2020-05-05 ENCOUNTER — Telehealth: Payer: Self-pay | Admitting: Family Medicine

## 2020-05-05 ENCOUNTER — Ambulatory Visit: Payer: Medicare HMO | Admitting: Family Medicine

## 2020-05-05 NOTE — Telephone Encounter (Signed)
Dispense Quantity: 60 tablet Refills: 5   Note to Pharmacy: NO refills.       Sig: Take 1 tablet (50 mg total) by mouth 2 (two) times daily as needed for moderate pain. TAKES 1 AT BEDTIME AND 1 DURING THE DAY ONLY IF NEEDED   Please advise, should there be 5 refills or no refills?

## 2020-05-05 NOTE — Telephone Encounter (Signed)
Clarification on traMADol (ULTRAM) 50 MG tablet

## 2020-05-05 NOTE — Telephone Encounter (Signed)
[name] [name] is calling in asking for clarification on the refill, states there is 5 refills but in comments to pharmacist it has no refills.

## 2020-05-05 NOTE — Telephone Encounter (Signed)
Called pharmacy to clarify.  Medication has been discontinued out of the system. Verbal given to the pharmacist Elta Guadeloupe L. to renew prescription.

## 2020-05-05 NOTE — Telephone Encounter (Signed)
There are 5 refills. Thanks

## 2020-05-13 ENCOUNTER — Telehealth: Payer: Self-pay

## 2020-05-13 NOTE — Telephone Encounter (Signed)
Sent prior authorization determination form to [facility] for medication Dicyclomine 10mg .   Awaiting dermination

## 2020-05-14 NOTE — Telephone Encounter (Signed)
Received approval for Dicyclomine 10mg  through 10/03/2020

## 2020-06-03 ENCOUNTER — Other Ambulatory Visit: Payer: Self-pay | Admitting: Family Medicine

## 2020-06-17 ENCOUNTER — Ambulatory Visit: Payer: Medicare HMO

## 2020-06-20 DIAGNOSIS — M2550 Pain in unspecified joint: Secondary | ICD-10-CM | POA: Diagnosis not present

## 2020-06-20 DIAGNOSIS — M797 Fibromyalgia: Secondary | ICD-10-CM | POA: Diagnosis not present

## 2020-06-20 DIAGNOSIS — M150 Primary generalized (osteo)arthritis: Secondary | ICD-10-CM | POA: Diagnosis not present

## 2020-06-20 DIAGNOSIS — M810 Age-related osteoporosis without current pathological fracture: Secondary | ICD-10-CM | POA: Diagnosis not present

## 2020-06-20 DIAGNOSIS — Z6841 Body Mass Index (BMI) 40.0 and over, adult: Secondary | ICD-10-CM | POA: Diagnosis not present

## 2020-06-24 NOTE — Progress Notes (Signed)
Subjective:   [name] [name] is a 74 y.o. female who presents for Medicare Annual (Subsequent) preventive examination.  I connected with Saori today by telephone and verified that I am speaking with the correct person using two identifiers. Location patient: home Location provider: work Persons participating in the virtual visit: patient, Marine scientist.    I discussed the limitations, risks, security and privacy concerns of performing an evaluation and management service by telephone and the availability of in person appointments. I also discussed with the patient that there may be a patient responsible charge related to this service. The patient expressed understanding and verbally consented to this telephonic visit.    Interactive audio and video telecommunications were attempted between this provider and patient, however failed, due to patient having technical difficulties OR patient did not have access to video capability.  We continued and completed visit with audio only.  Some vital signs may be absent or patient reported.   Time Spent with patient on telephone encounter: 25 minutes  Review of Systems     Cardiac Risk Factors include: advanced age (>24men, >5 women);hypertension;dyslipidemia;obesity (BMI >30kg/m2);sedentary lifestyle     Objective:    Today's Vitals   06/25/20 1247  Weight: 192 lb (87.1 kg)  Height: 5\' 5"  (1.651 m)   Body mass index is 31.95 kg/m.  Advanced Directives 06/25/2020 10/31/2019 10/24/2019 01/22/2019 01/16/2018 12/10/2016 10/20/2016  Does Patient Have a Medical Advance Directive? Yes Yes Yes Yes Yes Yes Yes  Type of Paramedic of [place];Living will [place];Living will [place];Living will Living will;Healthcare Power of Attorney Living will;Healthcare Power of [place];Living will [place];Living will  Does patient want to make changes to medical  advance directive? - No - Patient declined No - Guardian declined No - Patient declined - - No - Patient declined  Copy of [place] in Chart? Yes - validated most recent copy scanned in chart (See row information) Yes - validated most recent copy scanned in chart (See row information) Yes - validated most recent copy scanned in chart (See row information) No - copy requested Yes Yes -  Would patient like information on creating a medical advance directive? - - No - Guardian declined - - - -    Current Medications (verified) Outpatient Encounter Medications as of 06/25/2020  Medication Sig   atenolol (TENORMIN) 25 MG tablet Take 1 tablet (25 mg total) by mouth every morning.   calcium carbonate (OS-CAL) 600 MG TABS Take 600 mg by mouth daily.   cetirizine (ZYRTEC) 10 MG tablet Take 10 mg by mouth daily as needed for allergies.   cholecalciferol (VITAMIN D) 1000 UNITS tablet Take 2 tablets (2,000 Units total) by mouth daily.   clotrimazole (LOTRIMIN) 1 % cream Apply 1 application topically 2 (two) times daily. To affected area   Cyanocobalamin (VITAMIN B-12) 1000 MCG SUBL Place 1 tablet under the tongue daily.    diclofenac Sodium (VOLTAREN) 1 % GEL Voltaren 1 % topical gel  APPLY 2 GRAM TO THE AFFECTED AREA(S) BY TOPICAL ROUTE 4 TIMES PER DAY   dicyclomine (BENTYL) 10 MG capsule TAKE 1 CAPSULE FOUR TIMES DAILY BEFORE MEALS AND AT BEDTIME   DULoxetine (CYMBALTA) 60 MG capsule Take 1 capsule (60 mg total) by mouth 2 (two) times daily.   escitalopram (LEXAPRO) 20 MG tablet Take 1 tablet (20 mg total) by mouth daily.   estradiol (ESTRACE VAGINAL) 0.1 MG/GM vaginal  cream Estrace 0.01% (0.1 mg/gram) vaginal cream  1 gm q hs x14 then 1/2 gm 2x/wk   fesoterodine (TOVIAZ) 4 MG TB24 tablet Take 4 mg by mouth daily.   fluticasone (FLONASE) 50 MCG/ACT nasal spray Place into both nostrils as needed.    LORazepam (ATIVAN) 0.5 MG tablet Take 1 tablet (0.5 mg total) by mouth  every 12 (twelve) hours as needed for anxiety (for panic attack).   losartan-hydrochlorothiazide (HYZAAR) 50-12.5 MG tablet Take 1 tablet by mouth daily.   Multiple Vitamins-Minerals (WOMENS MULTIVITAMIN PLUS PO) Take 1 tablet by mouth daily.   Omega-3 Fatty Acids (FISH OIL PO) Take 1 capsule by mouth daily.    omeprazole (PRILOSEC) 40 MG capsule Take 1 capsule (40 mg total) by mouth 2 (two) times daily before a meal. 30 mins before breakfast and supper   oxybutynin (DITROPAN-XL) 10 MG 24 hr tablet Take 10 mg by mouth daily.   pravastatin (PRAVACHOL) 40 MG tablet Take 1 tablet (40 mg total) by mouth daily.   RESTASIS 0.05 % ophthalmic emulsion    tamoxifen (NOLVADEX) 20 MG tablet    traMADol (ULTRAM) 50 MG tablet Take 1 tablet (50 mg total) by mouth 2 (two) times daily as needed for moderate pain. TAKES 1 AT BEDTIME AND 1 DURING THE DAY ONLY IF NEEDED   traZODone (DESYREL) 100 MG tablet Take 1 tablet (100 mg total) by mouth at bedtime.   Turmeric Curcumin 500 MG CAPS Take 1,000 mg by mouth daily.    Zoledronic Acid (RECLAST IV) Inject into the vein.   No facility-administered encounter medications on file as of 06/25/2020.    Allergies (verified) Meloxicam, Morphine, Other, and Sulfa antibiotics   History: Past Medical History:  Diagnosis Date   Anxiety    Arthritis    oa   Chronic cystitis    Cystic thyroid nodule 08/09/2018   7x10 mm, right lobe   DDD (degenerative disc disease), cervical    Depression    Fibromyalgia    GERD (gastroesophageal reflux disease)    Headache    sinus   Heart murmur    History of duodenal ulcer    2007   History of gastritis    2007   HTN (hypertension)    Hyperlipidemia    Internal and external hemorrhoids without complication    Intrinsic (urethral) sphincter deficiency (ISD)    Irritable bowel syndrome    Osteopenia    Pernicious anemia    Pneumonia    PONV (postoperative nausea and vomiting)     Unspecified essential hypertension    Unspecified venous (peripheral) insufficiency    wears compression hose   Unspecified vitamin D deficiency    Urine incontinence    Past Surgical History:  Procedure Laterality Date   ANTERIOR AND POSTERIOR VAGINAL REPAIR  04-29-2004   AND TRANSVAGINAL TAPE SLING   ANTERIOR CERVICAL DECOMP/DISCECTOMY FUSION  1999   APPENDECTOMY  1984   BILATERAL SALPINGOOPHORECTOMY  1984   Bladder stimulator     BREAST LUMPECTOMY WITH RADIOACTIVE SEED LOCALIZATION Left 10/31/2019   Procedure: LEFT BREAST LUMPECTOMY WITH RADIOACTIVE SEED LOCALIZATION;  Surgeon: Erroll Luna, MD;  Location: [place];  Service: General;  Laterality: Left;   CARDIAC CATHETERIZATION  10-03-2002   DR Dannielle Burn   NORMAL CORONARIES ARTERIES/  EF 55%   CYSTOSCOPY N/A 08/27/2013   Procedure: CYSTOSCOPY;  Surgeon: Ailene Rud, MD;  [facility];  Service: Urology;  Laterality: N/A;   CYSTOSCOPY WITH INJECTION  N/A 12/17/2013   Procedure: Ascencion Dike WITH INJECTION;  Surgeon: Ailene Rud, MD;  [facility];  Service: Urology;  Laterality: N/A;   DILATION AND CURETTAGE OF UTERUS     EXCISION RIGHT NECK AND LEFT BREAST SEBACEOUS CYST  05-18-2002   HEMORRHOID BANDING  2014   PUBOVAGINAL SLING N/A 10/25/2016   Procedure: Gaynelle Arabian;  Surgeon: Carolan Clines, MD;  Location: [facility];  Service: Urology;  Laterality: N/A;   right litlle finger surgery  yrs ago   cyst removed x 4   TOTAL ABDOMINAL HYSTERECTOMY  1986   TUBAL LIGATION     VAGINAL PROLAPSE REPAIR N/A 08/27/2013   Procedure: ANTERIOR VAGINAL VAULT SUSPENSION, KELLY PLICATION WITH SACROSPINOUS LIGAMENT FIXATION AND XENFORM BOVINE DERMIS GRAFT AUGMENTATION, URETHRAL EXPLORATION, URETHROLYSIS, EXPLANTATION OF TVT TAPE, IMPLANTATION OF FLOSEAL, IMPLANTATION OF XENFORM BOVINE GRAFT IN PERIURETHRAL SPACE;  Surgeon: Ailene Rud, MD;  Location: [place];  Service: Urology;  Laterality: N/A   VAGINAL PROLAPSE REPAIR N/A 10/25/2016   Procedure: VAGINAL VAULT SUSPENSION with mesh and sacrospinous repair;  Surgeon: Carolan Clines, MD;  Location: [facility];  Service: Urology;  Laterality: N/A;   Family History  Problem Relation Age of Onset   Lung cancer Father    Coronary artery disease Father    Hypertension Mother    Heart disease Mother    Myelodysplastic syndrome Mother    Arthritis Mother    Cervical cancer Mother    Dementia Maternal Aunt    Fibromyalgia Sister    Hypertension Sister    Anemia Sister    Depression Sister    Hypertension Brother    Anemia Brother    Hypertension Brother    Hyperlipidemia Brother    Colon polyps Other        aunt   Heart disease Maternal Uncle    Lung cancer Paternal Uncle    Kidney disease Sister        kidney stones, removed kidney   Dementia Maternal Aunt    Lung cancer Maternal Uncle    Other Paternal Uncle        brain Tumor   Pancreatic cancer Cousin    Colon cancer Neg Hx    Social History   Socioeconomic History   Marital status: Divorced    Spouse name: Not on file   Number of children: 1   Years of education: Not on file   Highest education level: 12th grade  Occupational History   Occupation: Retired  Tobacco Use   Smoking status: Former Smoker    Packs/day: 1.00    Years: 15.00    Pack years: 15.00    Types: Cigarettes    Start date: 12/06/1993    Quit date: 08/09/2009    Years since quitting: 10.8   Smokeless tobacco: Never Used   Tobacco comment: quit smoking 5 years ago  Vaping Use   Vaping Use: Never used  Substance and Sexual Activity   Alcohol use: Not Currently    Comment: ocassional   Drug use: No   Sexual activity: Not Currently    Partners: Male  Other Topics Concern   Not on file  Social History Narrative   She is retired and divorced and has 1 child    Occasional alcohol, former smoker no drug use   Right handed    Caffeine 2-3 cups per day   Lives at alone    Social Determinants of Health   Financial Resource Strain:  Low Risk    Difficulty of Paying Living Expenses: Not very hard  Food Insecurity: Food Insecurity Present   Worried About Running Out of Food in the Last Year: Sometimes true   Ran Out of Food in the Last Year: Never true  Transportation Needs: No Transportation Needs   Lack of Transportation (Medical): No   Lack of Transportation (Non-Medical): No  Physical Activity: Inactive   Days of Exercise per Week: 0 days   Minutes of Exercise per Session: 0 min  Stress: No Stress Concern Present   Feeling of Stress : Not at all  Social Connections: Moderately Integrated   Frequency of Communication with Friends and Family: More than three times a week   Frequency of Social Gatherings with Friends and Family: Once a week   Attends Religious Services: 1 to 4 times per year   Active Member of [employer] or Organizations: Yes   Attends Archivist Meetings: 1 to 4 times per year   Marital Status: Divorced    Tobacco Counseling Counseling given: Not Answered Comment: quit smoking 5 years ago   Clinical Intake:  Pre-visit preparation completed: Yes  Pain : No/denies pain     Nutritional Status: BMI > 30  Obese Nutritional Risks: None Diabetes: No  How often do you need to have someone help you when you read instructions, pamphlets, or other written materials from your doctor or pharmacy?: 1 - Never What is the last grade level you completed in school?: 12th grade  Diabetic?No  Interpreter Needed?: No  Information entered by :: Caroleen Hamman LPN   Activities of Daily Living In your present state of health, do you have any difficulty performing the following activities: 06/25/2020 10/31/2019  Hearing? N N  Vision? N N  Difficulty concentrating or making decisions? N N  Walking or climbing  stairs? N Y  Dressing or bathing? N N  Doing errands, shopping? N -  Preparing Food and eating ? N -  Using the Toilet? N -  In the past six months, have you accidently leaked urine? Y -  Comment occasionally -  Do you have problems with loss of bowel control? N -  Managing your Medications? N -  Managing your Finances? N -  Housekeeping or managing your Housekeeping? N -  Some recent data might be hidden    Patient Care Team: Ma Hillock, DO as PCP - General (Family Medicine) Gatha Mayer, MD as Consulting Physician (Gastroenterology) Calvert Cantor, MD as Consulting Physician (Ophthalmology) [facility] [place] [facility], [facility], Carlis Abbott, Elon Alas, MD as Consulting Physician (Neurology)  Indicate any recent Medical Services you may have received from other than [facility] providers in the past year (date may be approximate).     Assessment:   This is a routine wellness examination for [place].  Hearing/Vision screen  Hearing Screening   125Hz  250Hz  500Hz  1000Hz  2000Hz  3000Hz  4000Hz  6000Hz  8000Hz   Right ear:           Left ear:           Comments: No issues  Vision Screening Comments: Wears glasses Last eye exam-01/2020-Dr. Bing Plume  Dietary issues and exercise activities discussed: Current Exercise Habits: The patient does not participate in regular exercise at present, Exercise limited by: None identified  Goals     Patient Stated     Eat a healthier diet & increase activity      Depression Screen PHQ 2/9 Scores 06/25/2020  04/24/2020 10/10/2019 04/24/2019 01/23/2019 01/19/2019 10/24/2018  PHQ - 2 Score 0 2 0 0 1 1 3   PHQ- 9 Score - 7 1 6 2 4 13   Exception Documentation - - - - - - Medical reason    Fall Risk Fall Risk  06/25/2020 10/10/2019 01/19/2019 04/19/2018 01/16/2018  Falls in the past year? 1 1 0 Yes Yes  Number falls in past yr: 1 0 0 1 1  Injury with Fall? 0 0 0 Yes No  Risk for fall due  to : History of fall(s) Medication side effect History of fall(s);Impaired balance/gait - -  Follow up Falls prevention discussed Falls evaluation completed;Education provided;Falls prevention discussed Education provided;Falls evaluation completed;Falls prevention discussed Falls prevention discussed;Education provided Falls prevention discussed    Any stairs in or around the home? Yes  If so, are there any without handrails? Yes  Basement stairs. Patient aware of fall hazard & states she is very careful. Home free of loose throw rugs in walkways, pet beds, electrical cords, etc? Yes  Adequate lighting in your home to reduce risk of falls? Yes   ASSISTIVE DEVICES UTILIZED TO PREVENT FALLS:  Life alert? No  Use of a cane, walker or w/c? No  Grab bars in the bathroom? Yes  Shower chair or bench in shower? No  Elevated toilet seat or a handicapped toilet? Yes   TIMED UP AND GO:  Was the test performed? No . Phone visit   Cognitive Function: No cognitive impairment noted. Patient does crossword puzzles & word search for brain health.  MMSE - Mini Mental State Exam 01/16/2018 11/24/2015  Orientation to time 5 5  Orientation to Place 5 5  Registration 3 3  Attention/ Calculation 5 5  Recall 3 3  Language- name 2 objects 2 2  Language- repeat 1 1  Language- follow 3 step command 3 3  Language- read & follow direction 1 1  Write a sentence 1 1  Copy design 1 1  Total score 30 30        Immunizations Immunization History  Administered Date(s) Administered   Influenza Split 07/02/2011   Influenza Whole 07/16/2008, 07/04/2009, 07/14/2010   Influenza, High Dose Seasonal PF 07/04/2018, 06/20/2019   Influenza,inj,Quad PF,6+ Mos 06/29/2013, 07/08/2014, 06/23/2017   Influenza-Unspecified 07/02/2015, 06/22/2019   Moderna SARS-COVID-2 Vaccination 12/20/2019, 01/17/2020   Pneumococcal Conjugate-13 05/15/2014   Pneumococcal Polysaccharide-23 10/04/2006, 09/11/2015   Td  03/19/2010   Tdap 03/10/2020   Zoster 07/25/2015   Zoster Recombinat (Shingrix) 07/07/2019, 09/05/2019    TDAP status: Up to date   Flu Vaccine Status: Due- Patient plans to get vaccine in the next couple of weeks.  Pneumococcal vaccine status: Up to date   Covid-19 vaccine status: Completed vaccines  Qualifies for Shingles Vaccine? No   Zostavax completed Yes   Shingrix Completed?: Yes  Screening Tests Health Maintenance  Topic Date Due   INFLUENZA VACCINE  05/04/2020   MAMMOGRAM  08/20/2021   DEXA SCAN  08/20/2022   TETANUS/TDAP  03/10/2030   COVID-19 Vaccine  Completed   Hepatitis C Screening  Completed   PNA vac Low Risk Adult  Completed    Health Maintenance  Health Maintenance Due  Topic Date Due   INFLUENZA VACCINE  05/04/2020    Colorectal cancer screening: No longer required.  Per results from 2017  Mammogram status: Completed 08/21/2019. Repeat every year   Bone Density status: Completed 08/21/2019. Results reflect: Bone density results: OSTEOPENIA. Repeat every 3 years.  Lung Cancer  Screening: (Low Dose CT Chest recommended if Age 9-80 years, 30 pack-year currently smoking OR have quit w/in 15years.) does not qualify.    Additional Screening:  Hepatitis C Screening:Completed 03/11/2015  Vision Screening: Recommended annual ophthalmology exams for early detection of glaucoma and other disorders of the eye. Is the patient up to date with their annual eye exam?  Yes  Who is the provider or what is the name of the office in which the patient attends annual eye exams? Dr. Bing Plume   Dental Screening: Recommended annual dental exams for proper oral hygiene  Community Resource Referral / Chronic Care Management: CRR required this visit?  No  Offerred CRR referral to patient due to difficulty buying groceries at times but she declined. Advised patient to call back if she changes her mind.  CCM required this visit?  No      Plan:     I have  personally reviewed and noted the following in the patients chart:    Medical and social history  Use of alcohol, tobacco or illicit drugs   Current medications and supplements  Functional ability and status  Nutritional status  Physical activity  Advanced directives  List of other physicians  Hospitalizations, surgeries, and ER visits in previous 12 months  Vitals  Screenings to include cognitive, depression, and falls  Referrals and appointments  In addition, I have reviewed and discussed with patient certain preventive protocols, quality metrics, and best practice recommendations. A written personalized care plan for preventive services as well as general preventive health recommendations were provided to patient.    Due to this being a telephonic visit, the after visit summary with patients personalized plan was offered to patient via mail or my-chart.  Patient would like to access on my-chart.   Marta Antu, LPN   0/07/711  Nurse Health Advisor  Nurse Notes: None

## 2020-06-25 ENCOUNTER — Ambulatory Visit (INDEPENDENT_AMBULATORY_CARE_PROVIDER_SITE_OTHER): Payer: Medicare HMO

## 2020-06-25 VITALS — Ht 65.0 in | Wt 192.0 lb

## 2020-06-25 DIAGNOSIS — Z0000 Encounter for general adult medical examination without abnormal findings: Secondary | ICD-10-CM | POA: Diagnosis not present

## 2020-06-25 NOTE — Patient Instructions (Signed)
[name] [name] , Thank you for taking time to complete your Medicare Wellness Visit. I appreciate your ongoing commitment to your health goals. Please review the following plan we discussed and let me know if I can assist you in the future.   Screening recommendations/referrals: Colonoscopy: Completed 03/17/2016- Not indicated after age 73 Mammogram: Completed 08/21/2019- Due 08/20/2020 Bone Density: Completed 08/21/2019- Due 08/20/2022 Recommended yearly ophthalmology/optometry visit for glaucoma screening and checkup Recommended yearly dental visit for hygiene and checkup  Vaccinations: Influenza vaccine: Due-May obtain vaccine at our office or your local pharmacy. Pneumococcal vaccine: Completed vaccines Tdap vaccine: Up to Date- Due-03/10/2030 Shingles vaccine: Completed vaccines   Covid-19:Completed vaccines  Advanced directives: Copy on file  Conditions/risks identified: See problem list  Next appointment: Follow up in one year for your annual wellness visit 07/01/21 @ 1:30pm.   Preventive Care 73 Years and Older, Female Preventive care refers to lifestyle choices and visits with your health care provider that can promote health and wellness. What does preventive care include?  A yearly physical exam. This is also called an annual well check.  Dental exams once or twice a year.  Routine eye exams. Ask your health care provider how often you should have your eyes checked.  Personal lifestyle choices, including:  Daily care of your teeth and gums.  Regular physical activity.  Eating a healthy diet.  Avoiding tobacco and drug use.  Limiting alcohol use.  Practicing safe sex.  Taking low-dose aspirin every day.  Taking vitamin and mineral supplements as recommended by your health care provider. What happens during an annual well check? The services and screenings done by your health care provider during your annual well check will depend on your age, overall health,  lifestyle risk factors, and family history of disease. Counseling  Your health care provider may ask you questions about your:  Alcohol use.  Tobacco use.  Drug use.  Emotional well-being.  Home and relationship well-being.  Sexual activity.  Eating habits.  History of falls.  Memory and ability to understand (cognition).  Work and work Statistician.  Reproductive health. Screening  You may have the following tests or measurements:  Height, weight, and BMI.  Blood pressure.  Lipid and cholesterol levels. These may be checked every 5 years, or more frequently if you are over 28 years old.  Skin check.  Lung cancer screening. You may have this screening every year starting at age 73 if you have a 30-pack-year history of smoking and currently smoke or have quit within the past 15 years.  Fecal occult blood test (FOBT) of the stool. You may have this test every year starting at age 73.  Flexible sigmoidoscopy or colonoscopy. You may have a sigmoidoscopy every 5 years or a colonoscopy every 10 years starting at age 73.  Hepatitis C blood test.  Hepatitis B blood test.  Sexually transmitted disease (STD) testing.  Diabetes screening. This is done by checking your blood sugar (glucose) after you have not eaten for a while (fasting). You may have this done every 1-3 years.  Bone density scan. This is done to screen for osteoporosis. You may have this done starting at age 73.  Mammogram. This may be done every 1-2 years. Talk to your health care provider about how often you should have regular mammograms. Talk with your health care provider about your test results, treatment options, and if necessary, the need for more tests. Vaccines  Your health care provider may recommend certain vaccines, such as:  Influenza vaccine. This is recommended every year.  Tetanus, diphtheria, and acellular pertussis (Tdap, Td) vaccine. You may need a Td booster every 10 years.  Zoster  vaccine. You may need this after age 68.  Pneumococcal 13-valent conjugate (PCV13) vaccine. One dose is recommended after age 73.  Pneumococcal polysaccharide (PPSV23) vaccine. One dose is recommended after age 73. Talk to your health care provider about which screenings and vaccines you need and how often you need them. This information is not intended to replace advice given to you by your health care provider. Make sure you discuss any questions you have with your health care provider. Document Released: 10/17/2015 Document Revised: 06/09/2016 Document Reviewed: 07/22/2015 Elsevier Interactive Patient Education  2017 [place] Prevention in the Home Falls can cause injuries. They can happen to people of all ages. There are many things you can do to make your home safe and to help prevent falls. What can I do on the outside of my home?  Regularly fix the edges of walkways and driveways and fix any cracks.  Remove anything that might make you trip as you walk through a door, such as a raised step or threshold.  Trim any bushes or trees on the path to your home.  Use bright outdoor lighting.  Clear any walking paths of anything that might make someone trip, such as rocks or tools.  Regularly check to see if handrails are loose or broken. Make sure that both sides of any steps have handrails.  Any raised decks and porches should have guardrails on the edges.  Have any leaves, snow, or ice cleared regularly.  Use sand or salt on walking paths during winter.  Clean up any spills in your garage right away. This includes oil or grease spills. What can I do in the bathroom?  Use night lights.  Install grab bars by the toilet and in the tub and shower. Do not use towel bars as grab bars.  Use non-skid mats or decals in the tub or shower.  If you need to sit down in the shower, use a plastic, non-slip stool.  Keep the floor dry. Clean up any water that spills on the  floor as soon as it happens.  Remove soap buildup in the tub or shower regularly.  Attach bath mats securely with double-sided non-slip rug tape.  Do not have throw rugs and other things on the floor that can make you trip. What can I do in the bedroom?  Use night lights.  Make sure that you have a light by your bed that is easy to reach.  Do not use any sheets or blankets that are too big for your bed. They should not hang down onto the floor.  Have a firm chair that has side arms. You can use this for support while you get dressed.  Do not have throw rugs and other things on the floor that can make you trip. What can I do in the kitchen?  Clean up any spills right away.  Avoid walking on wet floors.  Keep items that you use a lot in easy-to-reach places.  If you need to reach something above you, use a strong step stool that has a grab bar.  Keep electrical cords out of the way.  Do not use floor polish or wax that makes floors slippery. If you must use wax, use non-skid floor wax.  Do not have throw rugs and other things on the floor that  can make you trip. What can I do with my stairs?  Do not leave any items on the stairs.  Make sure that there are handrails on both sides of the stairs and use them. Fix handrails that are broken or loose. Make sure that handrails are as long as the stairways.  Check any carpeting to make sure that it is firmly attached to the stairs. Fix any carpet that is loose or worn.  Avoid having throw rugs at the top or bottom of the stairs. If you do have throw rugs, attach them to the floor with carpet tape.  Make sure that you have a light switch at the top of the stairs and the bottom of the stairs. If you do not have them, ask someone to add them for you. What else can I do to help prevent falls?  Wear shoes that:  Do not have high heels.  Have rubber bottoms.  Are comfortable and fit you well.  Are closed at the toe. Do not wear  sandals.  If you use a stepladder:  Make sure that it is fully opened. Do not climb a closed stepladder.  Make sure that both sides of the stepladder are locked into place.  Ask someone to hold it for you, if possible.  Clearly mark and make sure that you can see:  Any grab bars or handrails.  First and last steps.  Where the edge of each step is.  Use tools that help you move around (mobility aids) if they are needed. These include:  Canes.  Walkers.  Scooters.  Crutches.  Turn on the lights when you go into a dark area. Replace any light bulbs as soon as they burn out.  Set up your furniture so you have a clear path. Avoid moving your furniture around.  If any of your floors are uneven, fix them.  If there are any pets around you, be aware of where they are.  Review your medicines with your doctor. Some medicines can make you feel dizzy. This can increase your chance of falling. Ask your doctor what other things that you can do to help prevent falls. This information is not intended to replace advice given to you by your health care provider. Make sure you discuss any questions you have with your health care provider. Document Released: 07/17/2009 Document Revised: 02/26/2016 Document Reviewed: 10/25/2014 Elsevier Interactive Patient Education  2017 [employer].

## 2020-07-22 ENCOUNTER — Other Ambulatory Visit: Payer: Self-pay

## 2020-07-22 ENCOUNTER — Ambulatory Visit (INDEPENDENT_AMBULATORY_CARE_PROVIDER_SITE_OTHER): Payer: Medicare HMO | Admitting: Family Medicine

## 2020-07-22 ENCOUNTER — Encounter: Payer: Self-pay | Admitting: Family Medicine

## 2020-07-22 VITALS — BP 116/73 | HR 88 | Temp 98.4°F | Ht 65.0 in | Wt 185.0 lb

## 2020-07-22 DIAGNOSIS — R42 Dizziness and giddiness: Secondary | ICD-10-CM | POA: Diagnosis not present

## 2020-07-22 DIAGNOSIS — R9431 Abnormal electrocardiogram [ECG] [EKG]: Secondary | ICD-10-CM

## 2020-07-22 DIAGNOSIS — B372 Candidiasis of skin and nail: Secondary | ICD-10-CM

## 2020-07-22 MED ORDER — CEPHALEXIN 500 MG PO CAPS: 500.0000 mg | ORAL_CAPSULE | Freq: Two times a day (BID) | ORAL | 0 refills | Status: DC

## 2020-07-22 MED ORDER — FLUCONAZOLE 150 MG PO TABS: 150.0000 mg | ORAL_TABLET | Freq: Once | ORAL | 0 refills | Status: AC

## 2020-07-22 MED ORDER — CLOTRIMAZOLE 1 % EX CREA: 1.0000 "application " | TOPICAL_CREAM | Freq: Two times a day (BID) | CUTANEOUS | 3 refills | Status: DC

## 2020-07-22 NOTE — Patient Instructions (Signed)
Diflucan- one pill only. Cream twice a day until completely resolved.  Keflex pill every 12 hours for 7 days for skin infection.  Hibiclens skin wash 2-3 x a week to yeast load down.   Skin Yeast Infection  A skin yeast infection is a condition in which there is an overgrowth of yeast (candida) that normally lives on the skin. This condition usually occurs in areas of the skin that are constantly warm and moist, such as the armpits or the groin. What are the causes? This condition is caused by a change in the normal balance of the yeast and bacteria that live on the skin. What increases the risk? You are more likely to develop this condition if you:  Are obese.  Are pregnant.  Take birth control pills.  Have diabetes.  Take antibiotic medicines.  Take steroid medicines.  Are malnourished.  Have a weak body defense system (immune system).  Are 53 years of age or older.  Wear tight clothing. What are the signs or symptoms? The most common symptom of this condition is itchiness in the affected area. Other symptoms include:  Red, swollen area of the skin.  Bumps on the skin. How is this diagnosed?  This condition is diagnosed with a medical history and physical exam.  Your health care provider may check for yeast by taking light scrapings of the skin to be viewed under a microscope. How is this treated? This condition is treated with medicine. Medicines may be prescribed or be available over the counter. The medicines may be:  Taken by mouth (orally).  Applied as a cream or powder to your skin. Follow these instructions at home:   Take or apply over-the-counter and prescription medicines only as told by your health care provider.  Maintain a healthy weight. If you need help losing weight, talk with your health care provider.  Keep your skin clean and dry.  If you have diabetes, keep your blood sugar under control.  Keep all follow-up visits as told by your  health care provider. This is important. Contact a health care provider if:  Your symptoms go away and then return.  Your symptoms do not get better with treatment.  Your symptoms get worse.  Your rash spreads.  You have a fever or chills.  You have new symptoms.  You have new warmth or redness of your skin. Summary  A skin yeast infection is a condition in which there is an overgrowth of yeast (candida) that normally lives on the skin. This condition is caused by a change in the normal balance of the yeast and bacteria that live on the skin.  Take or apply over-the-counter and prescription medicines only as told by your health care provider.  Keep your skin clean and dry.  Contact a health care provider if your symptoms do not get better with treatment. This information is not intended to replace advice given to you by your health care provider. Make sure you discuss any questions you have with your health care provider. Document Revised: 02/07/2018 Document Reviewed: 02/07/2018 Elsevier Patient Education  [employer].

## 2020-07-22 NOTE — Progress Notes (Signed)
This visit occurred during the SARS-CoV-2 public health emergency.  Safety protocols were in place, including screening questions prior to the visit, additional usage of staff PPE, and extensive cleaning of exam room while observing appropriate contact time as indicated for disinfecting solutions.    [name] [name] , 11-27-1945, 74 y.o., female MRN: [id number] Patient Care Team    Relationship Specialty Notifications Start End  Ma Hillock, DO PCP - General Family Medicine  06/02/15   Gatha Mayer, MD Consulting Physician Gastroenterology  11/24/15   Calvert Cantor, MD Consulting Physician Ophthalmology  11/24/15   Specialists, [facility]  Orthopedic Surgery  04/12/16   Pa, [facility]    01/16/18   Rheumatology, [facility]    01/16/18   Kathrynn Ducking, MD Consulting Physician Neurology  10/24/18     Chief Complaint  Patient presents with  . Rash    pt c/o rash under breast area x4-5 mos; pt states rash is red, itching and soreness on area     Subjective: Pt presents for an OV with complaints of red oozing and itchy rash under bilateral breasts.  She had a similar presentation in April 2020 and was treated with clotrimazole, good resolution.  She states she does recall having this in the past but not quite this bad before.  She states it is becoming quite uncomfortable and is worse over the last few weeks.  She denies fevers.  She has been using Goldbond powder and athlete's foot cream which has not seen to improve her condition.  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  They ran a short EKG tracing, which she has with her today, however she did not seek treatment.  Depression screen [facility] 2/9 07/22/2020 06/25/2020 04/24/2020 10/10/2019 04/24/2019  Decreased Interest 0 0 1 0 0  Down, Depressed, Hopeless 0 0 1 0 0  PHQ - 2 Score 0 0 2 0 0  Altered sleeping - - 1 0 0  Tired, decreased energy - - 3 1 3    Change in appetite - - 0 0 3  Feeling bad or failure about yourself  - - 0 0 0  Trouble concentrating - - 1 0 0  Moving slowly or fidgety/restless - - 0 0 0  Suicidal thoughts - - 0 0 0  PHQ-9 Score - - 7 1 6   Difficult doing work/chores - - Somewhat difficult Not difficult at all -  Some recent data might be hidden    Allergies  Allergen Reactions  . Meloxicam Swelling    Caused edema and Cr increase  . Morphine Nausea And Vomiting    SEVERE  . Other Other (See Comments)  . Sulfa Antibiotics Swelling   Social History   Social History Narrative   She is retired and divorced and has 1 child   Occasional alcohol, former smoker no drug use   Right handed    Caffeine 2-3 cups per day   Lives at alone    Past Medical History:  Diagnosis Date  . Anxiety   . Arthritis    oa  . Chronic cystitis   . 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 DDD (degenerative disc disease), cervical   . Depression   . Fibromyalgia   . GERD (gastroesophageal reflux disease)   . Headache    sinus  . Heart murmur   . History of duodenal  ulcer    2007  . History of gastritis    2007  . HTN (hypertension)   . Hyperlipidemia   . Internal and external hemorrhoids without complication   . Intrinsic (urethral) sphincter deficiency (ISD)   . Irritable bowel syndrome   . Osteopenia   . Pernicious anemia   . Pneumonia   . PONV (postoperative nausea and vomiting)   . Unspecified essential hypertension   . Unspecified venous (peripheral) insufficiency    wears compression hose  . Unspecified vitamin D deficiency   . Urine incontinence    Past Surgical History:  Procedure Laterality Date  . ANTERIOR AND POSTERIOR VAGINAL REPAIR  04-29-2004   AND TRANSVAGINAL TAPE SLING  . ANTERIOR CERVICAL DECOMP/DISCECTOMY FUSION  1999  . APPENDECTOMY  1984  . BILATERAL SALPINGOOPHORECTOMY  1984  . Bladder stimulator    . BREAST LUMPECTOMY WITH RADIOACTIVE SEED LOCALIZATION Left  10/31/2019   Procedure: LEFT BREAST LUMPECTOMY WITH RADIOACTIVE SEED LOCALIZATION;  Surgeon: Erroll Luna, MD;  Location: [place];  Service: General;  Laterality: Left;  . CARDIAC CATHETERIZATION  10-03-2002   DR DEGENT   NORMAL CORONARIES ARTERIES/  EF 55%  . CYSTOSCOPY N/A 08/27/2013   Procedure: CYSTOSCOPY;  Surgeon: Ailene Rud, MD;  Location: [facility];  Service: Urology;  Laterality: N/A;  . CYSTOSCOPY WITH INJECTION N/A 12/17/2013   Procedure: Ascencion Dike WITH INJECTION;  Surgeon: Ailene Rud, MD;  Location: [facility];  Service: Urology;  Laterality: N/A;  . DILATION AND CURETTAGE OF UTERUS    . EXCISION RIGHT NECK AND LEFT BREAST SEBACEOUS CYST  05-18-2002  . HEMORRHOID BANDING  2014  . PUBOVAGINAL SLING N/A 10/25/2016   Procedure: Gaynelle Arabian;  Surgeon: Carolan Clines, MD;  Location: [facility];  Service: Urology;  Laterality: N/A;  . right litlle finger surgery  yrs ago   cyst removed x 4  . TOTAL ABDOMINAL HYSTERECTOMY  1986  . TUBAL LIGATION    . VAGINAL PROLAPSE REPAIR N/A 08/27/2013   Procedure: ANTERIOR VAGINAL VAULT SUSPENSION, KELLY PLICATION WITH SACROSPINOUS LIGAMENT FIXATION AND XENFORM BOVINE DERMIS GRAFT AUGMENTATION, URETHRAL EXPLORATION, URETHROLYSIS, EXPLANTATION OF TVT TAPE, IMPLANTATION OF FLOSEAL, IMPLANTATION OF XENFORM BOVINE GRAFT IN PERIURETHRAL SPACE;  Surgeon: Ailene Rud, MD;  Location: [place];  Service: Urology;  Laterality: N/A  . VAGINAL PROLAPSE REPAIR N/A 10/25/2016   Procedure: VAGINAL VAULT SUSPENSION with mesh and sacrospinous repair;  Surgeon: Carolan Clines, MD;  Location: [facility];  Service: Urology;  Laterality: N/A;   Family History  Problem Relation Age of Onset  . Lung cancer Father   . Coronary artery disease Father   . Hypertension Mother   . Heart disease Mother   . Myelodysplastic syndrome Mother   . Arthritis Mother   .  Cervical cancer Mother   . Dementia Maternal Aunt   . Fibromyalgia Sister   . Hypertension Sister   . Anemia Sister   . Depression Sister   . Hypertension Brother   . Anemia Brother   . Hypertension Brother   . Hyperlipidemia Brother   . Colon polyps Other        aunt  . Heart disease Maternal Uncle   . Lung cancer Paternal Uncle   . Kidney disease Sister        kidney stones, removed kidney  . Dementia Maternal Aunt   . Lung cancer Maternal Uncle   . Other Paternal Uncle  brain Tumor  . Pancreatic cancer Cousin   . Colon cancer Neg Hx    Allergies as of 07/22/2020      Reactions   Meloxicam Swelling   Caused edema and Cr increase   Morphine Nausea And Vomiting   SEVERE   Other Other (See Comments)   Sulfa Antibiotics Swelling      Medication List       Accurate as of July 22, 2020  1:00 PM. If you have any questions, ask your nurse or doctor.        atenolol 25 MG tablet Commonly known as: TENORMIN Take 1 tablet (25 mg total) by mouth every morning.   calcium carbonate 600 MG Tabs tablet Commonly known as: OS-CAL Take 600 mg by mouth daily.   cephALEXin 500 MG capsule Commonly known as: KEFLEX Take 1 capsule (500 mg total) by mouth 2 (two) times daily. Started by: Howard Pouch, DO   cetirizine 10 MG tablet Commonly known as: ZYRTEC Take 10 mg by mouth daily as needed for allergies.   cholecalciferol 1000 units tablet Commonly known as: VITAMIN D Take 2 tablets (2,000 Units total) by mouth daily.   clotrimazole 1 % cream Commonly known as: LOTRIMIN Apply 1 application topically 2 (two) times daily. To affected area   dicyclomine 10 MG capsule Commonly known as: BENTYL TAKE 1 CAPSULE FOUR TIMES DAILY BEFORE MEALS AND AT BEDTIME   DULoxetine 60 MG capsule Commonly known as: CYMBALTA Take 1 capsule (60 mg total) by mouth 2 (two) times daily.   escitalopram 20 MG tablet Commonly known as: LEXAPRO Take 1 tablet (20 mg total) by mouth  daily.   ESTRACE VAGINAL 0.1 MG/GM vaginal cream Generic drug: estradiol Estrace 0.01% (0.1 mg/gram) vaginal cream  1 gm q hs x14 then 1/2 gm 2x/wk   FISH OIL PO Take 1 capsule by mouth daily.   fluconazole 150 MG tablet Commonly known as: DIFLUCAN Take 1 tablet (150 mg total) by mouth once for 1 dose. Started by: Howard Pouch, DO   fluticasone 50 MCG/ACT nasal spray Commonly known as: FLONASE Place into both nostrils as needed.   LORazepam 0.5 MG tablet Commonly known as: ATIVAN Take 1 tablet (0.5 mg total) by mouth every 12 (twelve) hours as needed for anxiety (for panic attack).   losartan-hydrochlorothiazide 50-12.5 MG tablet Commonly known as: HYZAAR Take 1 tablet by mouth daily.   omeprazole 40 MG capsule Commonly known as: PRILOSEC Take 1 capsule (40 mg total) by mouth 2 (two) times daily before a meal. 30 mins before breakfast and supper   oxybutynin 10 MG 24 hr tablet Commonly known as: DITROPAN-XL Take 10 mg by mouth daily.   pravastatin 40 MG tablet Commonly known as: PRAVACHOL Take 1 tablet (40 mg total) by mouth daily.   RECLAST IV Inject into the vein.   Restasis 0.05 % ophthalmic emulsion Generic drug: cycloSPORINE   tamoxifen 20 MG tablet Commonly known as: NOLVADEX   Toviaz 4 MG Tb24 tablet Generic drug: fesoterodine Take 4 mg by mouth daily.   traMADol 50 MG tablet Commonly known as: ULTRAM Take 1 tablet (50 mg total) by mouth 2 (two) times daily as needed for moderate pain. TAKES 1 AT BEDTIME AND 1 DURING THE DAY ONLY IF NEEDED   traZODone 100 MG tablet Commonly known as: DESYREL Take 1 tablet (100 mg total) by mouth at bedtime.   Turmeric Curcumin 500 MG Caps Take 1,000 mg by mouth daily.   Vitamin B-12 1000  MCG Subl Place 1 tablet under the tongue daily.   Voltaren 1 % Gel Generic drug: diclofenac Sodium Voltaren 1 % topical gel  APPLY 2 GRAM TO THE AFFECTED AREA(S) BY TOPICAL ROUTE 4 TIMES PER DAY   WOMENS MULTIVITAMIN PLUS  PO Take 1 tablet by mouth daily.       All past medical history, surgical history, allergies, family history, immunizations andmedications were updated in the EMR today and reviewed under the history and medication portions of their EMR.     ROS: Negative, with the exception of above mentioned in HPI   Objective:  BP 116/73   Pulse 88   Temp 98.4 F (36.9 C) (Oral)   Ht 5\' 5"  (1.651 m)   Wt 185 lb (83.9 kg)   SpO2 97%   BMI 30.79 kg/m  Body mass index is 30.79 kg/m. Gen: Afebrile. No acute distress. Nontoxic in appearance, well developed, well nourished.  HENT: AT. [place].  Eyes:Pupils Equal Round Reactive to light, Extraocular movements intact,  Conjunctiva without redness, discharge or icterus. CV: RRR  Skin: Exquisitely red flat, tender rash underneath bilateral breasts.  No drainage noted.  She does have some mild skin breakdown present. Neuro: Normal gait. PERLA. EOMi. Alert. Oriented x3    No exam data present No results found. No results found for this or any previous visit (from the past 24 hour(s)).  Assessment/Plan: [name] [name] is a 74 y.o. female present for OV for  Yeast dermatitis Hygiene discussed.  Patient encouraged to use a fresh washcloth or disposable washcloth after each cleansing.  Fresh towel for drying. Cleanse with Hibiclens solution 2-3 times a week continuously to keep bacterial and yeast load to a minimum. Keep area clean and very dry.  Avoid powders. Clotrimazole cream twice daily prescribed with refills for her. Diflucan pill x1 prescribed Keflex twice daily for 7 days prescribed for possible superinfection/cellulitis. Follow-up 1-2 weeks if not improving, sooner if worsening.   Abnormal EKG/Dizziness At the end of her acute appointment/work and she mentioned dizziness and tracing completed [place] fire station-which appears abnormal with possible A. fib.  However it is a very short tracing and patient is in regular rhythm today.  She is having  dizziness.  We discussed cardiology referral today to better evaluate tracing and consider for monitoring, and she is agreeable to this today. - Ambulatory referral to Cardiology   Reviewed expectations re: course of current medical issues.  Discussed self-management of symptoms.  Outlined signs and symptoms indicating need for more acute intervention.  Patient verbalized understanding and all questions were answered.  Patient received an After-Visit Summary.    Orders Placed This Encounter  Procedures  . Ambulatory referral to Cardiology   Meds ordered this encounter  Medications  . fluconazole (DIFLUCAN) 150 MG tablet    Sig: Take 1 tablet (150 mg total) by mouth once for 1 dose.    Dispense:  1 tablet    Refill:  0  . clotrimazole (LOTRIMIN) 1 % cream    Sig: Apply 1 application topically 2 (two) times daily. To affected area    Dispense:  60 g    Refill:  3  . cephALEXin (KEFLEX) 500 MG capsule    Sig: Take 1 capsule (500 mg total) by mouth 2 (two) times daily.    Dispense:  14 capsule    Refill:  0    Referral Orders     Ambulatory referral to Cardiology   Note is dictated utilizing voice  recognition software. Although note has been proof read prior to signing, occasional typographical errors still can be missed. If any questions arise, please do not hesitate to call for verification.   electronically signed by:  Howard Pouch, DO  [place]

## 2020-07-29 DIAGNOSIS — N3946 Mixed incontinence: Secondary | ICD-10-CM | POA: Diagnosis not present

## 2020-08-05 DIAGNOSIS — R32 Unspecified urinary incontinence: Secondary | ICD-10-CM | POA: Insufficient documentation

## 2020-08-05 DIAGNOSIS — E785 Hyperlipidemia, unspecified: Secondary | ICD-10-CM | POA: Insufficient documentation

## 2020-08-05 DIAGNOSIS — F419 Anxiety disorder, unspecified: Secondary | ICD-10-CM | POA: Insufficient documentation

## 2020-08-05 DIAGNOSIS — M8580 Other specified disorders of bone density and structure, unspecified site: Secondary | ICD-10-CM | POA: Insufficient documentation

## 2020-08-05 DIAGNOSIS — R112 Nausea with vomiting, unspecified: Secondary | ICD-10-CM | POA: Insufficient documentation

## 2020-08-05 DIAGNOSIS — K644 Residual hemorrhoidal skin tags: Secondary | ICD-10-CM | POA: Insufficient documentation

## 2020-08-05 DIAGNOSIS — M1990 Unspecified osteoarthritis, unspecified site: Secondary | ICD-10-CM | POA: Insufficient documentation

## 2020-08-05 DIAGNOSIS — K648 Other hemorrhoids: Secondary | ICD-10-CM | POA: Insufficient documentation

## 2020-08-05 DIAGNOSIS — R011 Cardiac murmur, unspecified: Secondary | ICD-10-CM | POA: Insufficient documentation

## 2020-08-05 DIAGNOSIS — J189 Pneumonia, unspecified organism: Secondary | ICD-10-CM | POA: Insufficient documentation

## 2020-08-05 DIAGNOSIS — M797 Fibromyalgia: Secondary | ICD-10-CM | POA: Insufficient documentation

## 2020-08-05 DIAGNOSIS — Z8719 Personal history of other diseases of the digestive system: Secondary | ICD-10-CM | POA: Insufficient documentation

## 2020-08-05 DIAGNOSIS — R519 Headache, unspecified: Secondary | ICD-10-CM | POA: Insufficient documentation

## 2020-08-05 DIAGNOSIS — Z9889 Other specified postprocedural states: Secondary | ICD-10-CM | POA: Insufficient documentation

## 2020-08-05 DIAGNOSIS — I10 Essential (primary) hypertension: Secondary | ICD-10-CM | POA: Insufficient documentation

## 2020-08-05 DIAGNOSIS — D510 Vitamin B12 deficiency anemia due to intrinsic factor deficiency: Secondary | ICD-10-CM | POA: Insufficient documentation

## 2020-08-05 DIAGNOSIS — F32A Depression, unspecified: Secondary | ICD-10-CM | POA: Insufficient documentation

## 2020-08-05 DIAGNOSIS — N3020 Other chronic cystitis without hematuria: Secondary | ICD-10-CM | POA: Insufficient documentation

## 2020-08-06 ENCOUNTER — Ambulatory Visit: Payer: Medicare HMO | Admitting: Cardiology

## 2020-08-06 ENCOUNTER — Other Ambulatory Visit: Payer: Self-pay

## 2020-08-06 ENCOUNTER — Encounter: Payer: Self-pay | Admitting: Cardiology

## 2020-08-06 VITALS — BP 102/66 | HR 90 | Ht 65.0 in | Wt 192.0 lb

## 2020-08-06 DIAGNOSIS — E782 Mixed hyperlipidemia: Secondary | ICD-10-CM | POA: Diagnosis not present

## 2020-08-06 DIAGNOSIS — R011 Cardiac murmur, unspecified: Secondary | ICD-10-CM | POA: Insufficient documentation

## 2020-08-06 DIAGNOSIS — R42 Dizziness and giddiness: Secondary | ICD-10-CM | POA: Insufficient documentation

## 2020-08-06 DIAGNOSIS — I10 Essential (primary) hypertension: Secondary | ICD-10-CM

## 2020-08-06 DIAGNOSIS — R072 Precordial pain: Secondary | ICD-10-CM

## 2020-08-06 HISTORY — DX: Dizziness and giddiness: R42

## 2020-08-06 HISTORY — DX: Cardiac murmur, unspecified: R01.1

## 2020-08-06 MED ORDER — LOSARTAN POTASSIUM-HCTZ 50-12.5 MG PO TABS: 0.5000 | ORAL_TABLET | Freq: Every day | ORAL | 1 refills | Status: DC

## 2020-08-06 NOTE — Patient Instructions (Signed)
Medication Instructions:  Your physician has recommended you make the following change in your medication:   Decrease your Losartan-hydrochlorothiazide to 1/2 tablet daily.  *If you need a refill on your cardiac medications before your next appointment, please call your pharmacy*   Lab Work: None ordered If you have labs (blood work) drawn today and your tests are completely normal, you will receive your results only by: Marland Kitchen MyChart Message (if you have MyChart) OR . A paper copy in the mail If you have any lab test that is abnormal or we need to change your treatment, we will call you to review the results.   Testing/Procedures: Your physician has requested that you have an echocardiogram. Echocardiography is a painless test that uses sound waves to create images of your heart. It provides your doctor with information about the size and shape of your heart and how well your heart's chambers and valves are working. This procedure takes approximately one hour. There are no restrictions for this procedure.  Your physician has requested that you have a lexiscan myoview. For further information please visit [email or web address]. Please follow instruction sheet, as given.  The test will take approximately 3 to 4 hours to complete; you may bring reading material.  If someone comes with you to your appointment, they will need to remain in the main lobby due to limited space in the testing area. **If you are pregnant or breastfeeding, please notify the nuclear lab prior to your appointment**  How to prepare for your Myocardial Perfusion Test: . Do not eat or drink 3 hours prior to your test, except you may have water. . Do not consume products containing caffeine (regular or decaffeinated) 12 hours prior to your test. (ex: coffee, chocolate, sodas, tea). . Do bring a list of your current medications with you.  If not listed below, you may take your medications as normal. . Do wear comfortable clothes  (no dresses or overalls) and walking shoes, tennis shoes preferred (No heels or open toe shoes are allowed). . Do NOT wear cologne, perfume, aftershave, or lotions (deodorant is allowed). . If these instructions are not followed, your test will have to be rescheduled.    Follow-Up: At [facility], you and your health needs are our priority.  As part of our continuing mission to provide you with exceptional heart care, we have created designated Provider Care Teams.  These Care Teams include your primary Cardiologist (physician) and Advanced Practice Providers (APPs -  Physician Assistants and Nurse Practitioners) who all work together to provide you with the care you need, when you need it.  We recommend signing up for the patient portal called "MyChart".  Sign up information is provided on this After Visit Summary.  MyChart is used to connect with patients for Virtual Visits (Telemedicine).  Patients are able to view lab/test results, encounter notes, upcoming appointments, etc.  Non-urgent messages can be sent to your provider as well.   To learn more about what you can do with MyChart, go to [email or web address].    Your next appointment:   1 month(s)  The format for your next appointment:   In Person  Provider:   Jyl Heinz, MD   Other Instructions  Cardiac Nuclear Scan  A cardiac nuclear scan is a test that is done to check the flow of blood to your heart. It is done when you are resting and when you are exercising. The test looks for problems such as:  Not enough blood reaching a  portion of the heart.  The heart muscle not working as it should. You may need this test if:  You have heart disease.  You have had lab results that are not normal.  You have had heart surgery or a balloon procedure to open up blocked arteries (angioplasty).  You have chest pain.  You have shortness of breath. In this test, a special dye (tracer) is put into your bloodstream. The  tracer will travel to your heart. A camera will then take pictures of your heart to see how the tracer moves through your heart. This test is usually done at a hospital and takes 2-4 hours. Tell a doctor about:  Any allergies you have.  All medicines you are taking, including vitamins, herbs, eye drops, creams, and over-the-counter medicines.  Any problems you or family members have had with anesthetic medicines.  Any blood disorders you have.  Any surgeries you have had.  Any medical conditions you have.  Whether you are pregnant or may be pregnant. What are the risks? Generally, this is a safe test. However, problems may occur, such as:  Serious chest pain and heart attack. This is only a risk if the stress portion of the test is done.  Rapid heartbeat.  A feeling of warmth in your chest. This feeling usually does not last long.  Allergic reaction to the tracer. What happens before the test?  Ask your doctor about changing or stopping your normal medicines. This is important.  Follow instructions from your doctor about what you cannot eat or drink.  Remove your jewelry on the day of the test. What happens during the test? 1. An IV tube will be inserted into one of your veins. 2. Your doctor will give you a small amount of tracer through the IV tube. 3. You will wait for 20-40 minutes while the tracer moves through your bloodstream. 4. Your heart will be monitored with an electrocardiogram (ECG). 5. You will lie down on an exam table. 6. Pictures of your heart will be taken for about 15-20 minutes. 7. You may also have a stress test. For this test, one of these things may be done: ? You will be asked to exercise on a treadmill or a stationary bike. ? You will be given medicines that will make your heart work harder. This is done if you are unable to exercise. 8. When blood flow to your heart has peaked, a tracer will again be given through the IV tube. 9. After 20-40  minutes, you will get back on the exam table. More pictures will be taken of your heart. 10. Depending on the tracer that is used, more pictures may need to be taken 3-4 hours later. 11. Your IV tube will be removed when the test is over. The test may vary among doctors and hospitals. What happens after the test? 1. Ask your doctor: ? Whether you can return to your normal schedule, including diet, activities, and medicines. ? Whether you should drink more fluids. This will help to remove the tracer from your body. Drink enough fluid to keep your pee (urine) pale yellow. 2. Ask your doctor, or the department that is doing the test: ? When will my results be ready? ? How will I get my results? Summary  A cardiac nuclear scan is a test that is done to check the flow of blood to your heart.  Tell your doctor whether you are pregnant or may be pregnant.  Before the test, ask  your doctor about changing or stopping your normal medicines. This is important.  Ask your doctor whether you can return to your normal activities. You may be asked to drink more fluids. This information is not intended to replace advice given to you by your health care provider. Make sure you discuss any questions you have with your health care provider. Document Revised: 01/10/2019 Document Reviewed: 03/06/2018 Elsevier Patient Education  [place].  Echocardiogram An echocardiogram is a procedure that uses painless sound waves (ultrasound) to produce an image of the heart. Images from an echocardiogram can provide important information about:  Signs of coronary artery disease (CAD).  Aneurysm detection. An aneurysm is a weak or damaged part of an artery wall that bulges out from the normal force of blood pumping through the body.  Heart size and shape. Changes in the size or shape of the heart can be associated with certain conditions, including heart failure, aneurysm, and CAD.  Heart muscle  function.  Heart valve function.  Signs of a past heart attack.  Fluid buildup around the heart.  Thickening of the heart muscle.  A tumor or infectious growth around the heart valves. Tell a health care provider about:  Any allergies you have.  All medicines you are taking, including vitamins, herbs, eye drops, creams, and over-the-counter medicines.  Any blood disorders you have.  Any surgeries you have had.  Any medical conditions you have.  Whether you are pregnant or may be pregnant. What are the risks? Generally, this is a safe procedure. However, problems may occur, including:  Allergic reaction to dye (contrast) that may be used during the procedure. What happens before the procedure? No specific preparation is needed. You may eat and drink normally. What happens during the procedure?    An IV tube may be inserted into one of your veins.  You may receive contrast through this tube. A contrast is an injection that improves the quality of the pictures from your heart.  A gel will be applied to your chest.  A wand-like tool (transducer) will be moved over your chest. The gel will help to transmit the sound waves from the transducer.  The sound waves will harmlessly bounce off of your heart to allow the heart images to be captured in real-time motion. The images will be recorded on a computer. The procedure may vary among health care providers and hospitals. What happens after the procedure?  You may return to your normal, everyday life, including diet, activities, and medicines, unless your health care provider tells you not to do that. Summary  An echocardiogram is a procedure that uses painless sound waves (ultrasound) to produce an image of the heart.  Images from an echocardiogram can provide important information about the size and shape of your heart, heart muscle function, heart valve function, and fluid buildup around your heart.  You do not need to  do anything to prepare before this procedure. You may eat and drink normally.  After the echocardiogram is completed, you may return to your normal, everyday life, unless your health care provider tells you not to do that. This information is not intended to replace advice given to you by your health care provider. Make sure you discuss any questions you have with your health care provider. Document Revised: 01/11/2019 Document Reviewed: 10/23/2016 Elsevier Patient Education  [place].

## 2020-08-06 NOTE — Progress Notes (Signed)
Cardiology Office Note:    Date:  08/06/2020   ID:  [name] [name], DOB 1946-07-26, MRN [id number]  PCP:  Ma Hillock, DO  Cardiologist:  Jenean Lindau, MD   Referring MD: Ma Hillock, DO    ASSESSMENT:    1. Essential hypertension   2. Mixed hyperlipidemia   3. Cardiac murmur   4. Dizzy spells    PLAN:    In order of problems listed above:  1. Primary prevention stressed with patient.  Importance of compliance with diet medication stressed vocalized understanding. 2. Cardiac murmur: Echocardiogram will be done to assess murmur heard on auscultation. 3. Essential hypertension and dizzy spells: I think her blood pressure medication needs to be reduced.  I will reduce her Hyzaar to half tablet daily.  I encouraged hydration.  She will get back to me in a month and have a follow-up appointment to understand how she feels.  She needs to give [place] a phone call if she does not feel better.  She knows to go to the nearest emergency room if she has significant problems. 4. Mixed dyslipidemia: Diet was emphasized.  Weight reduction was stressed and she promises to do better.  Diet was emphasized.  Her lipids are fine from last evaluation. 5. Follow-up appointment in a month or earlier if she has any concerns.  Patient had multiple questions which were answered to her satisfaction.   Medication Adjustments/Labs and Tests Ordered: Current medicines are reviewed at length with the patient today.  Concerns regarding medicines are outlined above.  No orders of the defined types were placed in this encounter.  No orders of the defined types were placed in this encounter.    History of Present Illness:    [name] [name] is a 74 y.o. female who is being seen today for the evaluation of dizzy spells at the request of Kuneff, [place], DO.  Patient is a pleasant 74 year old female.  She has past medical history of essential hypertension, dyslipidemia and obesity.  She mentions to me that  recently she has been feeling dizzy.  This happens especially when she is standing posture.  No chest pain orthopnea or PND.  She has some chest discomfort at times not related to exertion.  No radiation to the neck or to the arms.  She has not had a syncopal event.  At the time of my evaluation, the patient is alert awake oriented and in no distress.  Past Medical History:  Diagnosis Date  . Anxiety   . Arthritis    oa  . Chronic cystitis   . 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 DDD (degenerative disc disease), cervical   . Depression   . Fibromyalgia   . GERD (gastroesophageal reflux disease)   . Headache    sinus  . Heart murmur   . History of duodenal ulcer    2007  . History of gastritis    2007  . HTN (hypertension)   . Hyperlipidemia   . Internal and external hemorrhoids without complication   . Intrinsic (urethral) sphincter deficiency (ISD)   . Irritable bowel syndrome   . Osteopenia   . Pernicious anemia   . Pneumonia   . PONV (postoperative nausea and vomiting)   . Unspecified essential hypertension   . Unspecified venous (peripheral) insufficiency    wears compression hose  . Unspecified vitamin D deficiency   . Urine incontinence     Past Surgical  History:  Procedure Laterality Date  . ANTERIOR AND POSTERIOR VAGINAL REPAIR  04-29-2004   AND TRANSVAGINAL TAPE SLING  . ANTERIOR CERVICAL DECOMP/DISCECTOMY FUSION  1999  . APPENDECTOMY  1984  . BILATERAL SALPINGOOPHORECTOMY  1984  . Bladder stimulator    . BREAST LUMPECTOMY WITH RADIOACTIVE SEED LOCALIZATION Left 10/31/2019   Procedure: LEFT BREAST LUMPECTOMY WITH RADIOACTIVE SEED LOCALIZATION;  Surgeon: Erroll Luna, MD;  Location: [facility];  Service: General;  Laterality: Left;  . CARDIAC CATHETERIZATION  10-03-2002   DR DEGENT   NORMAL CORONARIES ARTERIES/  EF 55%  . CYSTOSCOPY N/A 08/27/2013   Procedure: CYSTOSCOPY;  Surgeon: Ailene Rud, MD;  Location:  [facility];  Service: Urology;  Laterality: N/A;  . CYSTOSCOPY WITH INJECTION N/A 12/17/2013   Procedure: Ascencion Dike WITH INJECTION;  Surgeon: Ailene Rud, MD;  Location: [facility];  Service: Urology;  Laterality: N/A;  . DILATION AND CURETTAGE OF UTERUS    . EXCISION RIGHT NECK AND LEFT BREAST SEBACEOUS CYST  05-18-2002  . HEMORRHOID BANDING  2014  . PUBOVAGINAL SLING N/A 10/25/2016   Procedure: Gaynelle Arabian;  Surgeon: Carolan Clines, MD;  Location: [facility];  Service: Urology;  Laterality: N/A;  . right litlle finger surgery  yrs ago   cyst removed x 4  . TOTAL ABDOMINAL HYSTERECTOMY  1986  . TUBAL LIGATION    . VAGINAL PROLAPSE REPAIR N/A 08/27/2013   Procedure: ANTERIOR VAGINAL VAULT SUSPENSION, KELLY PLICATION WITH SACROSPINOUS LIGAMENT FIXATION AND XENFORM BOVINE DERMIS GRAFT AUGMENTATION, URETHRAL EXPLORATION, URETHROLYSIS, EXPLANTATION OF TVT TAPE, IMPLANTATION OF FLOSEAL, IMPLANTATION OF XENFORM BOVINE GRAFT IN PERIURETHRAL SPACE;  Surgeon: Ailene Rud, MD;  Location: [place] [facility];  Service: Urology;  Laterality: N/A  . VAGINAL PROLAPSE REPAIR N/A 10/25/2016   Procedure: VAGINAL VAULT SUSPENSION with mesh and sacrospinous repair;  Surgeon: Carolan Clines, MD;  Location: [facility];  Service: Urology;  Laterality: N/A;    Current Medications: Current Meds  Medication Sig  . atenolol (TENORMIN) 25 MG tablet Take 12.5 mg by mouth daily.  . calcium carbonate (OS-CAL) 600 MG TABS Take 600 mg by mouth daily.  . cetirizine (ZYRTEC) 10 MG tablet Take 10 mg by mouth daily as needed for allergies.  . cholecalciferol (VITAMIN D3) 25 MCG (1000 UNIT) tablet Take 1,000 Units by mouth daily.  . clotrimazole (LOTRIMIN) 1 % cream Apply 1 application topically 2 (two) times daily. To affected area  . Cyanocobalamin (VITAMIN B-12) 1000 MCG SUBL Place 1 tablet under the tongue daily.   . diclofenac Sodium (VOLTAREN) 1 % GEL  Voltaren 1 % topical gel  APPLY 2 GRAM TO THE AFFECTED AREA(S) BY TOPICAL ROUTE 4 TIMES PER DAY  . dicyclomine (BENTYL) 10 MG capsule TAKE 1 CAPSULE FOUR TIMES DAILY BEFORE MEALS AND AT BEDTIME  . DULoxetine (CYMBALTA) 60 MG capsule Take 1 capsule (60 mg total) by mouth 2 (two) times daily.  Marland Kitchen escitalopram (LEXAPRO) 20 MG tablet Take 1 tablet (20 mg total) by mouth daily.  . fesoterodine (TOVIAZ) 4 MG TB24 tablet Take 4 mg by mouth daily.  . fluticasone (FLONASE) 50 MCG/ACT nasal spray Place into both nostrils as needed.   Marland Kitchen LORazepam (ATIVAN) 0.5 MG tablet Take 1 tablet (0.5 mg total) by mouth every 12 (twelve) hours as needed for anxiety (for panic attack).  . losartan-hydrochlorothiazide (HYZAAR) 50-12.5 MG tablet Take 1 tablet by mouth daily.  . Multiple Vitamins-Minerals (WOMENS MULTIVITAMIN PLUS PO) Take 1 tablet  by mouth daily.  . Omega-3 Fatty Acids (FISH OIL PO) Take 1 capsule by mouth daily.   Marland Kitchen omeprazole (PRILOSEC) 40 MG capsule Take 1 capsule (40 mg total) by mouth 2 (two) times daily before a meal. 30 mins before breakfast and supper  . pravastatin (PRAVACHOL) 40 MG tablet Take 1 tablet (40 mg total) by mouth daily.  . RESTASIS 0.05 % ophthalmic emulsion   . tamoxifen (NOLVADEX) 20 MG tablet Take 20 mg by mouth daily.   . traMADol (ULTRAM) 50 MG tablet Take 1 tablet (50 mg total) by mouth 2 (two) times daily as needed for moderate pain. TAKES 1 AT BEDTIME AND 1 DURING THE DAY ONLY IF NEEDED  . traZODone (DESYREL) 100 MG tablet Take 1 tablet (100 mg total) by mouth at bedtime.  . Turmeric Curcumin 500 MG CAPS Take 1,000 mg by mouth daily.   . Zoledronic Acid (RECLAST IV) Inject into the vein.     Allergies:   Meloxicam, Morphine, Other, and Sulfa antibiotics   Social History   Socioeconomic History  . Marital status: Divorced    Spouse name: Not on file  . Number of children: 1  . Years of education: Not on file  . Highest education level: 12th grade  Occupational History    . Occupation: Retired  Tobacco Use  . Smoking status: Former Smoker    Packs/day: 1.00    Years: 15.00    Pack years: 15.00    Types: Cigarettes    Start date: 12/06/1993    Quit date: 08/09/2009    Years since quitting: 11.0  . Smokeless tobacco: Never Used  . Tobacco comment: quit smoking 5 years ago  Vaping Use  . Vaping Use: Never used  Substance and Sexual Activity  . Alcohol use: Not Currently    Comment: ocassional  . Drug use: No  . Sexual activity: Not Currently    Partners: Male  Other Topics Concern  . Not on file  Social History Narrative   She is retired and divorced and has 1 child   Occasional alcohol, former smoker no drug use   Right handed    Caffeine 2-3 cups per day   Lives at alone    Social Determinants of Health   Financial Resource Strain: [place]   . Difficulty of Paying Living Expenses: Not very hard  Food Insecurity: Food Insecurity Present  . Worried About Charity fundraiser in the Last Year: Sometimes true  . Ran Out of Food in the Last Year: Never true  Transportation Needs: No Transportation Needs  . Lack of Transportation (Medical): No  . Lack of Transportation (Non-Medical): No  Physical Activity: Inactive  . Days of Exercise per Week: 0 days  . Minutes of Exercise per Session: 0 min  Stress: No Stress Concern Present  . Feeling of Stress : Not at all  Social Connections: Moderately Integrated  . Frequency of Communication with Friends and Family: More than three times a week  . Frequency of Social Gatherings with Friends and Family: Once a week  . Attends Religious Services: 1 to 4 times per year  . Active Member of Clubs or Organizations: Yes  . Attends Archivist Meetings: 1 to 4 times per year  . Marital Status: Divorced     Family History: The patient's family history includes Anemia in her brother and sister; Arthritis in her mother; Cervical cancer in her mother; Colon polyps in an other family member; Coronary  artery disease in her father; Dementia in her maternal aunt and maternal aunt; Depression in her sister; Fibromyalgia in her sister; Heart disease in her maternal uncle and mother; Hyperlipidemia in her brother; Hypertension in her brother, brother, mother, and sister; Kidney disease in her sister; Lung cancer in her father, maternal uncle, and paternal uncle; Myelodysplastic syndrome in her mother; Other in her paternal uncle; Pancreatic cancer in her cousin. There is no history of Colon cancer.  ROS:   Please see the history of present illness.    All other systems reviewed and are negative.  EKGs/Labs/Other Studies Reviewed:    The following studies were reviewed today: EKG reveals sinus rhythm and nonspecific ST-T changes.   Recent Labs: 10/10/2019: TSH 3.22 10/26/2019: ALT 14; BUN 20; Creatinine, Ser 1.18; Hemoglobin 11.6; Platelets 261; Potassium 4.4; Sodium 142  Recent Lipid Panel    Component Value Date/Time   CHOL 190 10/10/2019 0916   TRIG 144.0 10/10/2019 0916   HDL 49.10 10/10/2019 0916   CHOLHDL 4 10/10/2019 0916   VLDL 28.8 10/10/2019 0916   LDLCALC 112 (H) 10/10/2019 0916    Physical Exam:    VS:  BP 102/66   Pulse 90   Ht 5\' 5"  (1.651 m)   Wt 192 lb 0.6 oz (87.1 kg)   SpO2 97%   BMI 31.96 kg/m     Wt Readings from Last 3 Encounters:  08/06/20 192 lb 0.6 oz (87.1 kg)  07/22/20 185 lb (83.9 kg)  06/25/20 192 lb (87.1 kg)     GEN: Patient is in no acute distress HEENT: Normal NECK: No JVD; No carotid bruits LYMPHATICS: No lymphadenopathy CARDIAC: S1 S2 regular, 2/6 systolic murmur at the apex. RESPIRATORY:  Clear to auscultation without rales, wheezing or rhonchi  ABDOMEN: Soft, non-tender, non-distended MUSCULOSKELETAL:  No edema; No deformity  SKIN: Warm and dry NEUROLOGIC:  Alert and oriented x 3 PSYCHIATRIC:  Normal affect    Signed, Jenean Lindau, MD  08/06/2020 3:11 PM    [facility]

## 2020-08-26 ENCOUNTER — Telehealth (HOSPITAL_COMMUNITY): Payer: Self-pay | Admitting: *Deleted

## 2020-08-26 NOTE — Telephone Encounter (Signed)
Left message on voicemail per DPR in reference to upcoming appointment scheduled on 09/01/20 at 8:15 with detailed instructions given per Myocardial Perfusion Study Information Sheet for the test. LM to arrive 15 minutes early, and that it is imperative to arrive on time for appointment to keep from having the test rescheduled. If you need to cancel or reschedule your appointment, please call the office within 24 hours of your appointment. Failure to do so may result in a cancellation of your appointment, and a $50 no show fee. Phone number given for call back for any questions.

## 2020-09-01 ENCOUNTER — Ambulatory Visit (HOSPITAL_BASED_OUTPATIENT_CLINIC_OR_DEPARTMENT_OTHER): Payer: Medicare HMO

## 2020-09-01 ENCOUNTER — Ambulatory Visit (HOSPITAL_COMMUNITY): Payer: Medicare HMO | Attending: Cardiology

## 2020-09-01 ENCOUNTER — Other Ambulatory Visit: Payer: Self-pay

## 2020-09-01 DIAGNOSIS — R42 Dizziness and giddiness: Secondary | ICD-10-CM | POA: Diagnosis not present

## 2020-09-01 DIAGNOSIS — R072 Precordial pain: Secondary | ICD-10-CM | POA: Diagnosis not present

## 2020-09-01 DIAGNOSIS — R011 Cardiac murmur, unspecified: Secondary | ICD-10-CM | POA: Diagnosis not present

## 2020-09-01 LAB — MYOCARDIAL PERFUSION IMAGING
LV dias vol: 68 mL (ref 46–106)
LV sys vol: 24 mL
Peak HR: 94 {beats}/min
Rest HR: 83 {beats}/min
SDS: 0
SRS: 0
SSS: 0
TID: 0.99

## 2020-09-01 LAB — ECHOCARDIOGRAM COMPLETE
Area-P 1/2: 5.66 cm2
Height: 65 in
S' Lateral: 3.5 cm
Weight: 3072 oz

## 2020-09-01 MED ORDER — REGADENOSON 0.4 MG/5ML IV SOLN
0.4000 mg | Freq: Once | INTRAVENOUS | Status: AC
  Administered 2020-09-01: 0.4 mg via INTRAVENOUS

## 2020-09-01 MED ORDER — TECHNETIUM TC 99M TETROFOSMIN IV KIT
32.4000 | PACK | Freq: Once | INTRAVENOUS | Status: AC | PRN
  Administered 2020-09-01: 32.4 via INTRAVENOUS
  Filled 2020-09-01: qty 33

## 2020-09-01 MED ORDER — PERFLUTREN LIPID MICROSPHERE
1.0000 mL | INTRAVENOUS | Status: AC | PRN
  Administered 2020-09-01: 1 mL via INTRAVENOUS

## 2020-09-01 MED ORDER — TECHNETIUM TC 99M TETROFOSMIN IV KIT
10.8000 | PACK | Freq: Once | INTRAVENOUS | Status: AC | PRN
  Administered 2020-09-01: 10.8 via INTRAVENOUS
  Filled 2020-09-01: qty 11

## 2020-09-08 DIAGNOSIS — I10 Essential (primary) hypertension: Secondary | ICD-10-CM | POA: Insufficient documentation

## 2020-09-08 DIAGNOSIS — E559 Vitamin D deficiency, unspecified: Secondary | ICD-10-CM | POA: Insufficient documentation

## 2020-09-09 ENCOUNTER — Encounter: Payer: Self-pay | Admitting: Cardiology

## 2020-09-09 ENCOUNTER — Other Ambulatory Visit: Payer: Self-pay

## 2020-09-09 ENCOUNTER — Ambulatory Visit: Payer: Medicare HMO | Admitting: Cardiology

## 2020-09-09 VITALS — BP 122/68 | HR 82 | Ht 65.0 in | Wt 192.1 lb

## 2020-09-09 DIAGNOSIS — E782 Mixed hyperlipidemia: Secondary | ICD-10-CM

## 2020-09-09 DIAGNOSIS — R002 Palpitations: Secondary | ICD-10-CM

## 2020-09-09 DIAGNOSIS — E669 Obesity, unspecified: Secondary | ICD-10-CM | POA: Insufficient documentation

## 2020-09-09 DIAGNOSIS — E66811 Obesity, class 1: Secondary | ICD-10-CM

## 2020-09-09 DIAGNOSIS — I10 Essential (primary) hypertension: Secondary | ICD-10-CM

## 2020-09-09 HISTORY — DX: Obesity, class 1: E66.811

## 2020-09-09 HISTORY — DX: Obesity, unspecified: E66.9

## 2020-09-09 NOTE — Patient Instructions (Signed)
Medication Instructions:  No medication changes. *If you need a refill on your cardiac medications before your next appointment, please call your pharmacy*   Lab Work: Your physician recommends that you have labs done in the office today. Your test included  basic metabolic panel, complete blood count, TSH, liver function and lipids.  If you have labs (blood work) drawn today and your tests are completely normal, you will receive your results only by: . MyChart Message (if you have MyChart) OR . A paper copy in the mail If you have any lab test that is abnormal or we need to change your treatment, we will call you to review the results.   Testing/Procedures: None ordered   Follow-Up: At [facility], you and your health needs are our priority.  As part of our continuing mission to provide you with exceptional heart care, we have created designated Provider Care Teams.  These Care Teams include your primary Cardiologist (physician) and Advanced Practice Providers (APPs -  Physician Assistants and Nurse Practitioners) who all work together to provide you with the care you need, when you need it.  We recommend signing up for the patient portal called "MyChart".  Sign up information is provided on this After Visit Summary.  MyChart is used to connect with patients for Virtual Visits (Telemedicine).  Patients are able to view lab/test results, encounter notes, upcoming appointments, etc.  Non-urgent messages can be sent to your provider as well.   To learn more about what you can do with MyChart, go to [email or web address].    Your next appointment:   6 month(s)  The format for your next appointment:   In Person  Provider:   Rajan Revankar, MD   Other Instructions NA   

## 2020-09-09 NOTE — Progress Notes (Signed)
Cardiology Office Note:    Date:  09/09/2020   ID:  [name] [name], DOB 01-Apr-1946, MRN [id number]  PCP:  Ma Hillock, DO  Cardiologist:  Jenean Lindau, MD   Referring MD: Ma Hillock, DO    ASSESSMENT:    1. Essential hypertension   2. Mixed hyperlipidemia   3. Obesity (BMI 30.0-34.9)    PLAN:    In order of problems listed above:  1. Primary prevention stressed with the patient.  Importance of compliance with diet medication stressed and she vocalized understanding.  She was advised to walk at least half an hour a day 5 days a week and she promises to do so. 2. Essential hypertension: Blood pressure stable and lifestyle modification and salt intake issues and diet was discussed at length 3. Mixed dyslipidemia: Lipids are fine from last evaluation.  That was more than 6 months ago.  She is fasting today and we will do complete blood work and send a copy to primary care 4. Obesity: Weight reduction was stressed diet was emphasized.  Again exercise was revisited.  Questions were answered to her satisfaction. 5. Patient will be seen in follow-up appointment in 6 months or earlier if the patient has any concerns    Medication Adjustments/Labs and Tests Ordered: Current medicines are reviewed at length with the patient today.  Concerns regarding medicines are outlined above.  No orders of the defined types were placed in this encounter.  No orders of the defined types were placed in this encounter.    No chief complaint on file.    History of Present Illness:    [name] [name] is a 74 y.o. female.  Patient has past medical history of essential hypertension dyslipidemia and obesity.  She denies any problems at this time and takes care of activities of daily living.  No chest pain orthopnea or PND.  She leads a sedentary lifestyle.  At the time of my evaluation, the patient is alert awake oriented and in no distress.   Past Medical History:  Diagnosis Date  . Abnormal  mammogram 08/17/2017   08/17/2017--> will need [place]  . Anemia due to other cause 04/16/2014   Family Hx of beta thalassemia & mother had myelodysplasia, MGF leukemia. Historical treatment: B12 inj. B12 levels are sustained on oral B complex.  Dropped twice in last yr-lowest 10.8. Improved from 10.8 to 11.8 after starting daily iron supplement.   She is c/o episodes of intense abd pain that lasts 2-3 days. She is taking bentyl with relief after day 2 or 3.     . Anxiety   . Arthritis    oa  . Atypical lobular hyperplasia (ALH) of left breast 12/06/2019  . Cardiac murmur 08/06/2020  . Chronic cystitis   . CKD (chronic kidney disease) stage 3, GFR 30-59 ml/min (HCC) 04/13/2017  . 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 DDD (degenerative disc disease), cervical   . Depression   . Depression with anxiety 11/04/2007   Current med: xanax PRN, zoloft 100 mg qd.    . Dizzy spells 08/06/2020  . Dysphagia 07/28/2018  . Essential hypertension 11/04/2007   Current meds: lisinopril/HCTZ 10/12.5 mg qd; tenormin 25 mg qd    . Fibromyalgia   . Gait instability 01/22/2019  . GERD (gastroesophageal reflux disease)   . Headache    sinus  . Heart murmur   . Hemorrhoids, internal, with bleeding 06/26/2014   Gr 2 all 3  positions    . History of duodenal ulcer    2007  . History of gastritis    2007  . Hoarseness 08/22/2015   GI- did not feel gerd, ENT did not feel ent, neuro- felt possibly ent--> sent for 2nd opinion ent.   Marland Kitchen HTN (hypertension)   . Hyperlipidemia   . Hypertension, essential   . Hypertriglyceridemia 07/31/2014  . Insomnia 07/25/2015  . Internal and external hemorrhoids without complication   . Intrinsic (urethral) sphincter deficiency (ISD)   . Irritable bowel syndrome   . Long term prescription benzodiazepine use 10/24/2018  . Lumbar pain with radiation down right leg 10/24/2018  . Medicare annual wellness visit, subsequent 11/24/2015  . Morbid obesity ([place]) 09/11/2015  .  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    . Osteopenia   . Osteoporosis 02/06/2014   The BMD measured at [facility] L2-L3 is 0.878 g/cm2 with a T-score of -2.7. This patient is considered osteoporotic according to [facility]) criteria. L-1, L-4 were excluded due to degenerative changes. Per the official positions of the ISCD, it is not possible to quantitatively compare BMD or calculate an [facility] between exams done at different facilities.   . Pain management contract agreement 10/24/2018  . Palpitations 04/20/2018  . Panic disorder 10/14/2016  . Pernicious anemia   . Pneumonia   . PONV (postoperative nausea and vomiting)   . Primary osteoarthritis of foot 10/24/2018  . Primary osteoarthritis of left knee 03/19/2015   Now with murphy wainer. MRI scheduled. 04/16/2016   . Seasonal allergies 02/19/2016  . Unspecified essential hypertension   . Unspecified venous (peripheral) insufficiency    wears compression hose  . Unspecified vitamin D deficiency   . Urinary incontinence 01/30/2014   Followed by Dr Gaynelle Arabian: Reviewed note 03/05/2015: mixed incontinence. Failed myrbetriq. stress incontinence, recurrent cystocele,f/u 6 mos for pelvic exam  Status post anterior vault suspension w/kelly plication & sacrospinous ligament fixation w/xenoform bovine dermis graft augmentation .     Marland Kitchen Urine incontinence   . Vitamin D deficiency 07/20/2008   Taking 1000 iu qd    . Vitamin D deficiency, unspecified     Past Surgical History:  Procedure Laterality Date  . ANTERIOR AND POSTERIOR VAGINAL REPAIR  04-29-2004   AND TRANSVAGINAL TAPE SLING  . ANTERIOR CERVICAL DECOMP/DISCECTOMY FUSION  1999  . APPENDECTOMY  1984  . BILATERAL SALPINGOOPHORECTOMY  1984  . Bladder stimulator    . BREAST LUMPECTOMY WITH RADIOACTIVE SEED LOCALIZATION Left 10/31/2019   Procedure: LEFT BREAST LUMPECTOMY WITH RADIOACTIVE SEED LOCALIZATION;  Surgeon: Erroll Luna, MD;  Location: [facility];  Service: General;  Laterality: Left;  . CARDIAC CATHETERIZATION  10-03-2002   DR DEGENT   NORMAL CORONARIES ARTERIES/  EF 55%  . CYSTOSCOPY N/A 08/27/2013   Procedure: CYSTOSCOPY;  Surgeon: Ailene Rud, MD;  Location: [facility];  Service: Urology;  Laterality: N/A;  . CYSTOSCOPY WITH INJECTION N/A 12/17/2013   Procedure: Ascencion Dike WITH INJECTION;  Surgeon: Ailene Rud, MD;  Location: [facility];  Service: Urology;  Laterality: N/A;  . DILATION AND CURETTAGE OF UTERUS    . EXCISION RIGHT NECK AND LEFT BREAST SEBACEOUS CYST  05-18-2002  . HEMORRHOID BANDING  2014  . PUBOVAGINAL SLING N/A 10/25/2016   Procedure: Gaynelle Arabian;  Surgeon: Carolan Clines, MD;  Location: [facility];  Service: Urology;  Laterality: N/A;  . right litlle finger surgery  yrs ago   cyst  removed x 4  . TOTAL ABDOMINAL HYSTERECTOMY  1986  . TUBAL LIGATION    . VAGINAL PROLAPSE REPAIR N/A 08/27/2013   Procedure: ANTERIOR VAGINAL VAULT SUSPENSION, KELLY PLICATION WITH SACROSPINOUS LIGAMENT FIXATION AND XENFORM BOVINE DERMIS GRAFT AUGMENTATION, URETHRAL EXPLORATION, URETHROLYSIS, EXPLANTATION OF TVT TAPE, IMPLANTATION OF FLOSEAL, IMPLANTATION OF XENFORM BOVINE GRAFT IN PERIURETHRAL SPACE;  Surgeon: Ailene Rud, MD;  Location: [facility];  Service: Urology;  Laterality: N/A  . VAGINAL PROLAPSE REPAIR N/A 10/25/2016   Procedure: VAGINAL VAULT SUSPENSION with mesh and sacrospinous repair;  Surgeon: Carolan Clines, MD;  Location: [facility];  Service: Urology;  Laterality: N/A;    Current Medications: Current Meds  Medication Sig  . atenolol (TENORMIN) 25 MG tablet Take 12.5 mg by mouth daily.  . calcium carbonate (OS-CAL) 600 MG TABS Take 600 mg by mouth daily.  . cetirizine (ZYRTEC) 10 MG tablet Take 10 mg by mouth daily as needed for allergies.  . cholecalciferol (VITAMIN D3) 25 MCG (1000 UNIT) tablet Take 1,000 Units by  mouth daily.  . Cyanocobalamin (VITAMIN B-12) 1000 MCG SUBL Place 1 tablet under the tongue daily.   . diclofenac Sodium (VOLTAREN) 1 % GEL Apply 1 application topically as needed.  . dicyclomine (BENTYL) 10 MG capsule TAKE 1 CAPSULE FOUR TIMES DAILY BEFORE MEALS AND AT BEDTIME  . DULoxetine (CYMBALTA) 60 MG capsule Take 1 capsule (60 mg total) by mouth 2 (two) times daily.  Marland Kitchen escitalopram (LEXAPRO) 20 MG tablet Take 1 tablet (20 mg total) by mouth daily.  . fesoterodine (TOVIAZ) 4 MG TB24 tablet Take 4 mg by mouth daily.  . fluticasone (FLONASE) 50 MCG/ACT nasal spray Place into both nostrils as needed.   Marland Kitchen LORazepam (ATIVAN) 0.5 MG tablet Take 1 tablet (0.5 mg total) by mouth every 12 (twelve) hours as needed for anxiety (for panic attack).  . losartan-hydrochlorothiazide (HYZAAR) 50-12.5 MG tablet Take 0.5 tablets by mouth daily.  . Multiple Vitamins-Minerals (WOMENS MULTIVITAMIN PLUS PO) Take 1 tablet by mouth daily.  . Omega-3 Fatty Acids (FISH OIL PO) Take 1 capsule by mouth daily.   Marland Kitchen omeprazole (PRILOSEC) 40 MG capsule Take 1 capsule (40 mg total) by mouth 2 (two) times daily before a meal. 30 mins before breakfast and supper  . pravastatin (PRAVACHOL) 40 MG tablet Take 1 tablet (40 mg total) by mouth daily.  . RESTASIS 0.05 % ophthalmic emulsion   . tamoxifen (NOLVADEX) 20 MG tablet Take 20 mg by mouth daily.   . traMADol (ULTRAM) 50 MG tablet Take 1 tablet (50 mg total) by mouth 2 (two) times daily as needed for moderate pain. TAKES 1 AT BEDTIME AND 1 DURING THE DAY ONLY IF NEEDED  . traZODone (DESYREL) 100 MG tablet Take 1 tablet (100 mg total) by mouth at bedtime.  . Turmeric Curcumin 500 MG CAPS Take 1,000 mg by mouth daily.   . Zoledronic Acid (RECLAST IV) Inject into the vein.     Allergies:   Meloxicam, Morphine, Other, and Sulfa antibiotics   Social History   Socioeconomic History  . Marital status: Divorced    Spouse name: Not on file  . Number of children: 1  . Years  of education: Not on file  . Highest education level: 12th grade  Occupational History  . Occupation: Retired  Tobacco Use  . Smoking status: Former Smoker    Packs/day: 1.00    Years: 15.00    Pack years: 15.00    Types: Cigarettes  Start date: 12/06/1993    Quit date: 08/09/2009    Years since quitting: 11.0  . Smokeless tobacco: Never Used  . Tobacco comment: quit smoking 5 years ago  Vaping Use  . Vaping Use: Never used  Substance and Sexual Activity  . Alcohol use: Not Currently    Comment: ocassional  . Drug use: No  . Sexual activity: Not Currently    Partners: Male  Other Topics Concern  . Not on file  Social History Narrative   She is retired and divorced and has 1 child   Occasional alcohol, former smoker no drug use   Right handed    Caffeine 2-3 cups per day   Lives at alone    Social Determinants of Health   Financial Resource Strain: [place]   . Difficulty of Paying Living Expenses: Not very hard  Food Insecurity: Food Insecurity Present  . Worried About Charity fundraiser in the Last Year: Sometimes true  . Ran Out of Food in the Last Year: Never true  Transportation Needs: No Transportation Needs  . Lack of Transportation (Medical): No  . Lack of Transportation (Non-Medical): No  Physical Activity: Inactive  . Days of Exercise per Week: 0 days  . Minutes of Exercise per Session: 0 min  Stress: No Stress Concern Present  . Feeling of Stress : Not at all  Social Connections: Moderately Integrated  . Frequency of Communication with Friends and Family: More than three times a week  . Frequency of Social Gatherings with Friends and Family: Once a week  . Attends Religious Services: 1 to 4 times per year  . Active Member of Clubs or Organizations: Yes  . Attends Archivist Meetings: 1 to 4 times per year  . Marital Status: Divorced     Family History: The patient's family history includes Anemia in her brother and sister; Arthritis in her  mother; Cervical cancer in her mother; Colon polyps in an other family member; Coronary artery disease in her father; Dementia in her maternal aunt and maternal aunt; Depression in her sister; Fibromyalgia in her sister; Heart disease in her maternal uncle and mother; Hyperlipidemia in her brother; Hypertension in her brother, brother, mother, and sister; Kidney disease in her sister; Lung cancer in her father, maternal uncle, and paternal uncle; Myelodysplastic syndrome in her mother; Other in her paternal uncle; Pancreatic cancer in her cousin. There is no history of Colon cancer.  ROS:   Please see the history of present illness.    All other systems reviewed and are negative.  EKGs/Labs/Other Studies Reviewed:    The following studies were reviewed today: 1. Mildly reduced apical counts on rest and stress with normal wall motion consistent with apical thinning artifact.  2. Normal study without ischemia or infarction. 3. Normal LVEF, 64%.  4. This is a low-risk study.   IMPRESSIONS    1. Left ventricular ejection fraction, by estimation, is 55 to 60%. The  left ventricle has normal function. The left ventricle has no regional  wall motion abnormalities. There is mild left ventricular hypertrophy.  Left ventricular diastolic parameters  are consistent with Grade I diastolic dysfunction (impaired relaxation).  2. Right ventricular systolic function is normal. The right ventricular  size is normal. Tricuspid regurgitation signal is inadequate for assessing  PA pressure.  3. The mitral valve is normal in structure. No evidence of mitral valve  regurgitation. No evidence of mitral stenosis.  4. The aortic valve is tricuspid. Aortic  valve regurgitation is not  visualized. No aortic stenosis is present.  5. The inferior vena cava is normal in size with greater than 50%  respiratory variability, suggesting right atrial pressure of 3 mmHg.      Recent Labs: 10/10/2019: TSH 3.22  10/26/2019: ALT 14; BUN 20; Creatinine, Ser 1.18; Hemoglobin 11.6; Platelets 261; Potassium 4.4; Sodium 142  Recent Lipid Panel    Component Value Date/Time   CHOL 190 10/10/2019 0916   TRIG 144.0 10/10/2019 0916   HDL 49.10 10/10/2019 0916   CHOLHDL 4 10/10/2019 0916   VLDL 28.8 10/10/2019 0916   LDLCALC 112 (H) 10/10/2019 0916    Physical Exam:    VS:  BP 122/68   Pulse 82   Ht 5\' 5"  (1.651 m)   Wt 192 lb 1.9 oz (87.1 kg)   SpO2 95%   BMI 31.97 kg/m     Wt Readings from Last 3 Encounters:  09/09/20 192 lb 1.9 oz (87.1 kg)  09/01/20 192 lb (87.1 kg)  08/06/20 192 lb 0.6 oz (87.1 kg)     GEN: Patient is in no acute distress HEENT: Normal NECK: No JVD; No carotid bruits LYMPHATICS: No lymphadenopathy CARDIAC: Hear sounds regular, 2/6 systolic murmur at the apex. RESPIRATORY:  Clear to auscultation without rales, wheezing or rhonchi  ABDOMEN: Soft, non-tender, non-distended MUSCULOSKELETAL:  No edema; No deformity  SKIN: Warm and dry NEUROLOGIC:  Alert and oriented x 3 PSYCHIATRIC:  Normal affect   Signed, Jenean Lindau, MD  09/09/2020 8:31 AM    [place]

## 2020-09-10 LAB — BASIC METABOLIC PANEL
BUN/Creatinine Ratio: 15 (ref 12–28)
BUN: 19 mg/dL (ref 8–27)
CO2: 26 mmol/L (ref 20–29)
Calcium: 9.9 mg/dL (ref 8.7–10.3)
Chloride: 100 mmol/L (ref 96–106)
Creatinine, Ser: 1.29 mg/dL — ABNORMAL HIGH (ref 0.57–1.00)
GFR calc Af Amer: 47 mL/min/{1.73_m2} — ABNORMAL LOW (ref >59)
GFR calc non Af Amer: 41 mL/min/{1.73_m2} — ABNORMAL LOW (ref >59)
Glucose: 86 mg/dL (ref 65–99)
Potassium: 4.4 mmol/L (ref 3.5–5.2)
Sodium: 138 mmol/L (ref 134–144)

## 2020-09-10 LAB — CBC WITH DIFFERENTIAL/PLATELET
Basophils Absolute: 0.1 10*3/uL (ref 0.0–0.2)
Basos: 1 %
EOS (ABSOLUTE): 0.2 10*3/uL (ref 0.0–0.4)
Eos: 3 %
Hematocrit: 35.5 % (ref 34.0–46.6)
Hemoglobin: 11.5 g/dL (ref 11.1–15.9)
Immature Grans (Abs): 0 10*3/uL (ref 0.0–0.1)
Immature Granulocytes: 0 %
Lymphocytes Absolute: 2.4 10*3/uL (ref 0.7–3.1)
Lymphs: 33 %
MCH: 29.6 pg (ref 26.6–33.0)
MCHC: 32.4 g/dL (ref 31.5–35.7)
MCV: 91 fL (ref 79–97)
Monocytes Absolute: 0.6 10*3/uL (ref 0.1–0.9)
Monocytes: 9 %
Neutrophils Absolute: 4.1 10*3/uL (ref 1.4–7.0)
Neutrophils: 54 %
Platelets: 261 10*3/uL (ref 150–450)
RBC: 3.89 x10E6/uL (ref 3.77–5.28)
RDW: 12.7 % (ref 11.7–15.4)
WBC: 7.4 10*3/uL (ref 3.4–10.8)

## 2020-09-10 LAB — LIPID PANEL
Chol/HDL Ratio: 3.4 ratio (ref 0.0–4.4)
Cholesterol, Total: 172 mg/dL (ref 100–199)
HDL: 51 mg/dL (ref >39)
LDL Chol Calc (NIH): 91 mg/dL (ref 0–99)
Triglycerides: 172 mg/dL — ABNORMAL HIGH (ref 0–149)
VLDL Cholesterol Cal: 30 mg/dL (ref 5–40)

## 2020-09-10 LAB — HEPATIC FUNCTION PANEL
ALT: 9 IU/L (ref 0–32)
AST: 12 IU/L (ref 0–40)
Albumin: 4.1 g/dL (ref 3.7–4.7)
Alkaline Phosphatase: 49 IU/L (ref 44–121)
Bilirubin Total: 0.3 mg/dL (ref 0.0–1.2)
Bilirubin, Direct: <0.1 mg/dL (ref 0.00–0.40)
Total Protein: 6.3 g/dL (ref 6.0–8.5)

## 2020-09-10 LAB — TSH: TSH: 2.73 u[IU]/mL (ref 0.450–4.500)

## 2020-09-15 ENCOUNTER — Other Ambulatory Visit: Payer: Self-pay

## 2020-09-15 DIAGNOSIS — F418 Other specified anxiety disorders: Secondary | ICD-10-CM

## 2020-09-15 DIAGNOSIS — G4700 Insomnia, unspecified: Secondary | ICD-10-CM

## 2020-09-15 MED ORDER — DULOXETINE HCL 60 MG PO CPEP: 60.0000 mg | ORAL_CAPSULE | Freq: Two times a day (BID) | ORAL | 0 refills | Status: DC

## 2020-10-07 ENCOUNTER — Other Ambulatory Visit: Payer: Self-pay | Admitting: Family Medicine

## 2020-10-07 ENCOUNTER — Other Ambulatory Visit: Payer: Self-pay

## 2020-10-07 DIAGNOSIS — G4700 Insomnia, unspecified: Secondary | ICD-10-CM

## 2020-10-07 DIAGNOSIS — F418 Other specified anxiety disorders: Secondary | ICD-10-CM

## 2020-10-09 ENCOUNTER — Other Ambulatory Visit: Payer: Self-pay

## 2020-10-10 ENCOUNTER — Other Ambulatory Visit: Payer: Self-pay | Admitting: Family Medicine

## 2020-10-10 ENCOUNTER — Encounter: Payer: Medicare HMO | Admitting: Family Medicine

## 2020-10-14 ENCOUNTER — Other Ambulatory Visit: Payer: Self-pay

## 2020-10-16 ENCOUNTER — Other Ambulatory Visit: Payer: Self-pay

## 2020-10-16 MED ORDER — ESCITALOPRAM OXALATE 20 MG PO TABS: 20.0000 mg | ORAL_TABLET | Freq: Every day | ORAL | 0 refills | Status: DC

## 2020-10-20 ENCOUNTER — Encounter: Payer: Medicare HMO | Admitting: Family Medicine

## 2020-10-29 ENCOUNTER — Telehealth: Payer: Self-pay | Admitting: Family Medicine

## 2020-10-29 MED ORDER — DICYCLOMINE HCL 10 MG PO CAPS: ORAL_CAPSULE | ORAL | 0 refills | Status: DC

## 2020-10-29 NOTE — Telephone Encounter (Signed)
30 day supply sent until pt appointment

## 2020-10-29 NOTE — Telephone Encounter (Signed)
Patient requesting refill of Dicyclomine. Please send to same [facility].

## 2020-11-05 ENCOUNTER — Encounter: Payer: Self-pay | Admitting: Family Medicine

## 2020-11-05 ENCOUNTER — Ambulatory Visit (INDEPENDENT_AMBULATORY_CARE_PROVIDER_SITE_OTHER): Payer: Medicare HMO | Admitting: Family Medicine

## 2020-11-05 ENCOUNTER — Other Ambulatory Visit: Payer: Self-pay

## 2020-11-05 VITALS — BP 119/80 | HR 77 | Temp 98.1°F | Ht 65.0 in | Wt 191.0 lb

## 2020-11-05 DIAGNOSIS — F410 Panic disorder [episodic paroxysmal anxiety] without agoraphobia: Secondary | ICD-10-CM | POA: Diagnosis not present

## 2020-11-05 DIAGNOSIS — D510 Vitamin B12 deficiency anemia due to intrinsic factor deficiency: Secondary | ICD-10-CM

## 2020-11-05 DIAGNOSIS — M19042 Primary osteoarthritis, left hand: Secondary | ICD-10-CM

## 2020-11-05 DIAGNOSIS — N6092 Unspecified benign mammary dysplasia of left breast: Secondary | ICD-10-CM

## 2020-11-05 DIAGNOSIS — E669 Obesity, unspecified: Secondary | ICD-10-CM

## 2020-11-05 DIAGNOSIS — M19079 Primary osteoarthritis, unspecified ankle and foot: Secondary | ICD-10-CM

## 2020-11-05 DIAGNOSIS — F418 Other specified anxiety disorders: Secondary | ICD-10-CM | POA: Diagnosis not present

## 2020-11-05 DIAGNOSIS — M79604 Pain in right leg: Secondary | ICD-10-CM

## 2020-11-05 DIAGNOSIS — E559 Vitamin D deficiency, unspecified: Secondary | ICD-10-CM | POA: Diagnosis not present

## 2020-11-05 DIAGNOSIS — M545 Low back pain, unspecified: Secondary | ICD-10-CM

## 2020-11-05 DIAGNOSIS — F3342 Major depressive disorder, recurrent, in full remission: Secondary | ICD-10-CM

## 2020-11-05 DIAGNOSIS — M1990 Unspecified osteoarthritis, unspecified site: Secondary | ICD-10-CM

## 2020-11-05 DIAGNOSIS — M19041 Primary osteoarthritis, right hand: Secondary | ICD-10-CM

## 2020-11-05 DIAGNOSIS — N183 Chronic kidney disease, stage 3 unspecified: Secondary | ICD-10-CM

## 2020-11-05 DIAGNOSIS — M5030 Other cervical disc degeneration, unspecified cervical region: Secondary | ICD-10-CM

## 2020-11-05 DIAGNOSIS — E781 Pure hyperglyceridemia: Secondary | ICD-10-CM

## 2020-11-05 DIAGNOSIS — Z0289 Encounter for other administrative examinations: Secondary | ICD-10-CM

## 2020-11-05 DIAGNOSIS — R002 Palpitations: Secondary | ICD-10-CM | POA: Diagnosis not present

## 2020-11-05 DIAGNOSIS — Z0000 Encounter for general adult medical examination without abnormal findings: Secondary | ICD-10-CM | POA: Diagnosis not present

## 2020-11-05 DIAGNOSIS — Z79899 Other long term (current) drug therapy: Secondary | ICD-10-CM

## 2020-11-05 DIAGNOSIS — M1712 Unilateral primary osteoarthritis, left knee: Secondary | ICD-10-CM

## 2020-11-05 DIAGNOSIS — M810 Age-related osteoporosis without current pathological fracture: Secondary | ICD-10-CM

## 2020-11-05 DIAGNOSIS — K588 Other irritable bowel syndrome: Secondary | ICD-10-CM

## 2020-11-05 DIAGNOSIS — K219 Gastro-esophageal reflux disease without esophagitis: Secondary | ICD-10-CM

## 2020-11-05 DIAGNOSIS — E66811 Obesity, class 1: Secondary | ICD-10-CM

## 2020-11-05 DIAGNOSIS — G4700 Insomnia, unspecified: Secondary | ICD-10-CM

## 2020-11-05 DIAGNOSIS — Z131 Encounter for screening for diabetes mellitus: Secondary | ICD-10-CM

## 2020-11-05 DIAGNOSIS — I10 Essential (primary) hypertension: Secondary | ICD-10-CM

## 2020-11-05 DIAGNOSIS — E782 Mixed hyperlipidemia: Secondary | ICD-10-CM

## 2020-11-05 DIAGNOSIS — M797 Fibromyalgia: Secondary | ICD-10-CM

## 2020-11-05 LAB — CBC WITH DIFFERENTIAL/PLATELET
Basophils Absolute: 0 10*3/uL (ref 0.0–0.1)
Basophils Relative: 0.7 % (ref 0.0–3.0)
Eosinophils Absolute: 0.2 10*3/uL (ref 0.0–0.7)
Eosinophils Relative: 3.2 % (ref 0.0–5.0)
HCT: 36 % (ref 36.0–46.0)
Hemoglobin: 11.7 g/dL — ABNORMAL LOW (ref 12.0–15.0)
Lymphocytes Relative: 35.1 % (ref 12.0–46.0)
Lymphs Abs: 2.4 10*3/uL (ref 0.7–4.0)
MCHC: 32.5 g/dL (ref 30.0–36.0)
MCV: 88.4 fl (ref 78.0–100.0)
Monocytes Absolute: 0.5 10*3/uL (ref 0.1–1.0)
Monocytes Relative: 7.5 % (ref 3.0–12.0)
Neutro Abs: 3.6 10*3/uL (ref 1.4–7.7)
Neutrophils Relative %: 53.5 % (ref 43.0–77.0)
Platelets: 250 10*3/uL (ref 150.0–400.0)
RBC: 4.07 Mil/uL (ref 3.87–5.11)
RDW: 13.8 % (ref 11.5–15.5)
WBC: 6.7 10*3/uL (ref 4.0–10.5)

## 2020-11-05 LAB — HEMOGLOBIN A1C: Hgb A1c MFr Bld: 5.3 % (ref 4.6–6.5)

## 2020-11-05 LAB — VITAMIN B12: Vitamin B-12: 763 pg/mL (ref 211–911)

## 2020-11-05 LAB — COMPREHENSIVE METABOLIC PANEL
ALT: 10 U/L (ref 0–35)
AST: 12 U/L (ref 0–37)
Albumin: 4.1 g/dL (ref 3.5–5.2)
Alkaline Phosphatase: 40 U/L (ref 39–117)
BUN: 23 mg/dL (ref 6–23)
CO2: 34 mEq/L — ABNORMAL HIGH (ref 19–32)
Calcium: 10.1 mg/dL (ref 8.4–10.5)
Chloride: 102 mEq/L (ref 96–112)
Creatinine, Ser: 1.2 mg/dL (ref 0.40–1.20)
GFR: 44.86 mL/min — ABNORMAL LOW (ref >60.00)
Glucose, Bld: 87 mg/dL (ref 70–99)
Potassium: 4 mEq/L (ref 3.5–5.1)
Sodium: 141 mEq/L (ref 135–145)
Total Bilirubin: 0.5 mg/dL (ref 0.2–1.2)
Total Protein: 6.4 g/dL (ref 6.0–8.3)

## 2020-11-05 LAB — LIPID PANEL
Cholesterol: 176 mg/dL (ref 0–200)
HDL: 53.7 mg/dL (ref >39.00)
LDL Cholesterol: 93 mg/dL (ref 0–99)
NonHDL: 122.43
Total CHOL/HDL Ratio: 3
Triglycerides: 147 mg/dL (ref 0.0–149.0)
VLDL: 29.4 mg/dL (ref 0.0–40.0)

## 2020-11-05 LAB — VITAMIN D 25 HYDROXY (VIT D DEFICIENCY, FRACTURES): VITD: 36.18 ng/mL (ref 30.00–100.00)

## 2020-11-05 LAB — TSH: TSH: 3.08 u[IU]/mL (ref 0.35–4.50)

## 2020-11-05 MED ORDER — LORAZEPAM 0.5 MG PO TABS
0.5000 mg | ORAL_TABLET | Freq: Two times a day (BID) | ORAL | 1 refills | Status: DC | PRN
Start: 2020-11-05 — End: 2021-04-22

## 2020-11-05 MED ORDER — ATENOLOL 25 MG PO TABS: 12.5000 mg | ORAL_TABLET | Freq: Every day | ORAL | 1 refills | Status: DC

## 2020-11-05 MED ORDER — DULOXETINE HCL 60 MG PO CPEP
ORAL_CAPSULE | ORAL | 1 refills | Status: DC
Start: 2020-11-05 — End: 2021-03-26

## 2020-11-05 MED ORDER — TRAMADOL HCL 50 MG PO TABS: 50.0000 mg | ORAL_TABLET | Freq: Two times a day (BID) | ORAL | 1 refills | Status: DC | PRN

## 2020-11-05 MED ORDER — PRAVASTATIN SODIUM 40 MG PO TABS: 40.0000 mg | ORAL_TABLET | Freq: Every day | ORAL | 3 refills | Status: DC

## 2020-11-05 MED ORDER — TRAZODONE HCL 100 MG PO TABS
100.0000 mg | ORAL_TABLET | Freq: Every day | ORAL | 1 refills | Status: DC
Start: 2020-11-05 — End: 2021-04-17

## 2020-11-05 MED ORDER — LOSARTAN POTASSIUM-HCTZ 50-12.5 MG PO TABS: 0.5000 | ORAL_TABLET | Freq: Every day | ORAL | 1 refills | Status: DC

## 2020-11-05 MED ORDER — ESCITALOPRAM OXALATE 20 MG PO TABS: 20.0000 mg | ORAL_TABLET | Freq: Every day | ORAL | 0 refills | Status: DC

## 2020-11-05 MED ORDER — DICYCLOMINE HCL 10 MG PO CAPS
ORAL_CAPSULE | ORAL | 1 refills | Status: DC
Start: 2020-11-05 — End: 2021-03-26

## 2020-11-05 MED ORDER — OMEPRAZOLE 40 MG PO CPDR: 40.0000 mg | DELAYED_RELEASE_CAPSULE | Freq: Two times a day (BID) | ORAL | 3 refills | Status: DC

## 2020-11-05 NOTE — Patient Instructions (Signed)
Health Maintenance After Age 74 After age 74, you are at a higher risk for certain long-term diseases and infections as well as injuries from falls. Falls are a major cause of broken bones and head injuries in people who are older than age 74. Getting regular preventive care can help to keep you healthy and well. Preventive care includes getting regular testing and making lifestyle changes as recommended by your health care provider. Talk with your health care provider about:  Which screenings and tests you should have. A screening is a test that checks for a disease when you have no symptoms.  A diet and exercise plan that is right for you. What should I know about screenings and tests to prevent falls? Screening and testing are the best ways to find a health problem early. Early diagnosis and treatment give you the best chance of managing medical conditions that are common after age 74. Certain conditions and lifestyle choices may make you more likely to have a fall. Your health care provider may recommend:  Regular vision checks. Poor vision and conditions such as cataracts can make you more likely to have a fall. If you wear glasses, make sure to get your prescription updated if your vision changes.  Medicine review. Work with your health care provider to regularly review all of the medicines you are taking, including over-the-counter medicines. Ask your health care provider about any side effects that may make you more likely to have a fall. Tell your health care provider if any medicines that you take make you feel dizzy or sleepy.  Osteoporosis screening. Osteoporosis is a condition that causes the bones to get weaker. This can make the bones weak and cause them to break more easily.  Blood pressure screening. Blood pressure changes and medicines to control blood pressure can make you feel dizzy.  Strength and balance checks. Your health care provider may recommend certain tests to check your  strength and balance while standing, walking, or changing positions.  Foot health exam. Foot pain and numbness, as well as not wearing proper footwear, can make you more likely to have a fall.  Depression screening. You may be more likely to have a fall if you have a fear of falling, feel emotionally low, or feel unable to do activities that you used to do.  Alcohol use screening. Using too much alcohol can affect your balance and may make you more likely to have a fall. What actions can I take to lower my risk of falls? General instructions  Talk with your health care provider about your risks for falling. Tell your health care provider if: ? You fall. Be sure to tell your health care provider about all falls, even ones that seem minor. ? You feel dizzy, sleepy, or off-balance.  Take over-the-counter and prescription medicines only as told by your health care provider. These include any supplements.  Eat a healthy diet and maintain a healthy weight. A healthy diet includes low-fat dairy products, low-fat (lean) meats, and fiber from whole grains, beans, and lots of fruits and vegetables. Home safety  Remove any tripping hazards, such as rugs, cords, and clutter.  Install safety equipment such as grab bars in bathrooms and safety rails on stairs.  Keep rooms and walkways well-lit. Activity  Follow a regular exercise program to stay fit. This will help you maintain your balance. Ask your health care provider what types of exercise are appropriate for you.  If you need a cane or walker,   use it as recommended by your health care provider.  Wear supportive shoes that have nonskid soles.   Lifestyle  Do not drink alcohol if your health care provider tells you not to drink.  If you drink alcohol, limit how much you have: ? 0-1 drink a day for women. ? 0-2 drinks a day for men.  Be aware of how much alcohol is in your drink. In the [place] one drink equals one typical bottle of beer (12  oz), one-half glass of wine (5 oz), or one shot of hard liquor (1 oz).  Do not use any products that contain nicotine or tobacco, such as cigarettes and e-cigarettes. If you need help quitting, ask your health care provider. Summary  Having a healthy lifestyle and getting preventive care can help to protect your health and wellness after age 74.  Screening and testing are the best way to find a health problem early and help you avoid having a fall. Early diagnosis and treatment give you the best chance for managing medical conditions that are more common for people who are older than age 74.  Falls are a major cause of broken bones and head injuries in people who are older than age 74. Take precautions to prevent a fall at home.  Work with your health care provider to learn what changes you can make to improve your health and wellness and to prevent falls. This information is not intended to replace advice given to you by your health care provider. Make sure you discuss any questions you have with your health care provider. Document Revised: 01/11/2019 Document Reviewed: 08/03/2017 Elsevier Patient Education  2021 [employer]  

## 2020-11-05 NOTE — Progress Notes (Signed)
Patient ID: [name] [name], female  DOB: 25-Sep-1946, 75 y.o.   MRN: [id number] Patient Care Team    Relationship Specialty Notifications Start End  Ma Hillock, DO PCP - General Family Medicine  06/02/15   Gatha [facility], MD Consulting Physician Gastroenterology  11/24/15   Calvert Cantor, MD Consulting Physician Ophthalmology  11/24/15   Specialists, [facility]  Orthopedic Surgery  04/12/16   Pa, [facility]    01/16/18   Rheumatology, [facility]    01/16/18   Kathrynn Ducking, MD Consulting Physician Neurology  10/24/18     Chief Complaint  Patient presents with  . Annual Exam    Pt is fasting     Subjective:  [name] [name] is a 75 y.o.  female present for CPE. All past medical history, surgical history, allergies, family history, immunizations, medications and social history were updated in the electronic medical record today. All recent labs, ED visits and hospitalizations within the last year were reviewed.  Health maintenance:  Colonoscopy: no further screenings recommended by GI- 03/2016- Dr. Carlean Purl.  PAP > 65. No longer indicated.  Mammogram (50-74): [facility]- follow ing w/ surgeon this mo.  Immunizations: Influenza vaccination up-to-date 2021, tetanus vaccination up-to-date 2021, pneumonia vaccinations completed.  Shingrix offered and patient would like prescription.Shingrix completed  Infectious disease screening: Up-to-date with recommended infectious disease screenings. DEXA: rpt due 08/2021- osteoporosis. Reclast last one this April 2022 Glaucoma screen: UTD, Dr. Bing Plume 12/2019 Hearing: Whisper test, no barriers identified. Assistive device: none Oxygen use: none Patient has a Dental home. Hospitalizations/ED visits:reviewed  Hypertension/hyperlipidemia/CKD3:  Pt reports compliance  with Pravastatin 40 mg, losartan-HCTZ 50-12.5 mg daily mg and atenolol 12.5 mg. Patient denies chest pain, shortness of breath, dizziness or lower extremity  edema.   Pt does not take daily baby ASA. Pt is  prescribed statin. Labs completed today Diet: Low-sodium Exercise: Attempts RF: Hypertension, hyperlipidemia. Family history of heart disease, former smoker, obesity  Anxiety/panic attack/insomnia: Patient reports compliance with lexapro 20 mg daily, Cymbalta twice a day, trazodone 100 mg nightly.. She had been on zoloft and tapered off when lexapro started. Cymbalta has been at maximum dose and likely chosen secondary to her lumbar/arthritis issues.   She does take  Ativan twice daily.She feels her anxiety is the best she has ever had control. .   Arthritis/pain/lumbar pain with radiation down right leg: Patient reports compliance   with tramadol 50 mg twice a day when necessary for arthritic pain. She is in need of refills today.She complains of low back and right leg pain throughout the day as well. She has h/o anterior cervical decompression/discectomy in the past. Indication for chronic opioid: Moderate to severe arthritis and osteoporosis discomfort Medication and dose: Tramadol 50 mg twice daily as needed # pills per: #60 Last UDS date: today collected today Pain contract signed (Y/N): Yes Date narcotic database last reviewed (include red flags): 11/05/20   Depression screen [facility] 2/9 07/22/2020 06/25/2020 04/24/2020 10/10/2019 04/24/2019  Decreased Interest 0 0 1 0 0  Down, Depressed, Hopeless 0 0 1 0 0  PHQ - 2 Score 0 0 2 0 0  Altered sleeping - - 1 0 0  Tired, decreased energy - - 3 1 3   Change in appetite - - 0 0 3  Feeling bad or failure about yourself  - - 0 0 0  Trouble concentrating - - 1 0 0  Moving slowly or fidgety/restless - - 0 0 0  Suicidal thoughts - - 0 0 0  PHQ-9 Score - - 7 1 6   Difficult doing work/chores - - Somewhat difficult Not difficult at all -  Some recent data might be hidden   GAD 7 : Generalized Anxiety Score 10/10/2019 04/24/2019 01/23/2019 01/19/2019  Nervous, Anxious, on Edge 1 2 1 1   Control/stop  worrying 0 2 0 1  Worry too much - different things 0 2 3 1   Trouble relaxing 1 0 0 1  Restless 0 0 0 0  Easily annoyed or irritable 0 0 0 0  Afraid - awful might happen 1 0 1 1  Total GAD 7 Score 3 6 5 5   Anxiety Difficulty Not difficult at all - Not difficult at all Not difficult at all       Fall Risk  07/22/2020 06/25/2020 10/10/2019 01/19/2019 04/19/2018  Falls in the past year? 1 1 1  0 Yes  Number falls in past yr: 1 1 0 0 1  Injury with Fall? 0 0 0 0 Yes  Risk for fall due to : - History of fall(s) Medication side effect History of fall(s);Impaired balance/gait -  Follow up Falls evaluation completed Falls prevention discussed Falls evaluation completed;Education provided;Falls prevention discussed Education provided;Falls evaluation completed;Falls prevention discussed Falls prevention discussed;Education provided    Immunization History  Administered Date(s) Administered  . Influenza Split 07/02/2011  . Influenza Whole 07/16/2008, 07/04/2009, 07/14/2010  . Influenza, High Dose Seasonal PF 07/04/2018, 06/20/2019  . Influenza,inj,Quad PF,6+ Mos 06/29/2013, 07/08/2014, 06/23/2017  . Influenza-Unspecified 07/02/2015, 06/22/2019, 08/02/2020  . Moderna Sars-Covid-2 Vaccination 12/20/2019, 01/17/2020, 05/23/2020  . Pneumococcal Conjugate-13 05/15/2014  . Pneumococcal Polysaccharide-23 10/04/2006, 09/11/2015  . Td 03/19/2010  . Tdap 03/10/2020  . Zoster 07/25/2015  . Zoster Recombinat (Shingrix) 07/07/2019, 09/05/2019    No exam data present  Past Medical History:  Diagnosis Date  . Abnormal mammogram 08/17/2017   08/17/2017--> will need [place]  . Anemia due to other cause 04/16/2014   Family Hx of beta thalassemia & mother had myelodysplasia, MGF leukemia. Historical treatment: B12 inj. B12 levels are sustained on oral B complex.  Dropped twice in last yr-lowest 10.8. Improved from 10.8 to 11.8 after starting daily iron supplement.   She is c/o episodes of intense abd pain that  lasts 2-3 days. She is taking bentyl with relief after day 2 or 3.     . Anxiety   . Arthritis    oa  . Atypical lobular hyperplasia (ALH) of left breast 12/06/2019  . Cardiac murmur 08/06/2020  . Chronic cystitis   . CKD (chronic kidney disease) stage 3, GFR 30-59 ml/min (HCC) 04/13/2017  . 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 DDD (degenerative disc disease), cervical   . Depression   . Depression with anxiety 11/04/2007   Current med: xanax PRN, zoloft 100 mg qd.    . Dizzy spells 08/06/2020  . Dysphagia 07/28/2018  . Essential hypertension 11/04/2007   Current meds: lisinopril/HCTZ 10/12.5 mg qd; tenormin 25 mg qd    . Fibromyalgia   . Gait instability 01/22/2019  . GERD (gastroesophageal reflux disease)   . Headache    sinus  . Heart murmur   . Hemorrhoids, internal, with bleeding 06/26/2014   Gr 2 all 3 positions    . Hemorrhoids, internal, with bleeding 06/26/2014   Gr 2 all 3 positions    . History of duodenal ulcer    2007  . History of gastritis    2007  .  Hoarseness 08/22/2015   GI- did not feel gerd, ENT did not feel ent, neuro- felt possibly ent--> sent for 2nd opinion ent.   Marland Kitchen HTN (hypertension)   . Hyperlipidemia   . Hypertension, essential   . Hypertriglyceridemia 07/31/2014  . Insomnia 07/25/2015  . Internal and external hemorrhoids without complication   . Internal and external hemorrhoids without complication   . Intrinsic (urethral) sphincter deficiency (ISD)   . Irritable bowel syndrome   . Long term prescription benzodiazepine use 10/24/2018  . Lumbar pain with radiation down right leg 10/24/2018  . Medicare annual wellness visit, subsequent 11/24/2015  . Morbid obesity ([employer]) 09/11/2015  . 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    . Osteopenia   . Osteoporosis 02/06/2014   The BMD measured at [facility] L2-L3 is 0.878 g/cm2 with a T-score of -2.7. This patient is considered osteoporotic according to [facility]) criteria. L-1, L-4 were excluded due to degenerative changes. Per the official positions of the ISCD, it is not possible to quantitatively compare BMD or calculate an [facility] between exams done at different facilities.   . Pain management contract agreement 10/24/2018  . Palpitations 04/20/2018  . Panic disorder 10/14/2016  . Pernicious anemia   . Pneumonia   . PONV (postoperative nausea and vomiting)   . PONV (postoperative nausea and vomiting)   . Primary osteoarthritis of foot 10/24/2018  . Primary osteoarthritis of left knee 03/19/2015   Now with murphy wainer. MRI scheduled. 04/16/2016   . Seasonal allergies 02/19/2016  . Unspecified essential hypertension   . Unspecified venous (peripheral) insufficiency    wears compression hose  . Unspecified vitamin D deficiency   . Urinary incontinence 01/30/2014   Followed by Dr Gaynelle Arabian: Reviewed note 03/05/2015: mixed incontinence. Failed myrbetriq. stress incontinence, recurrent cystocele,f/u 6 mos for pelvic exam  Status post anterior vault suspension w/kelly plication & sacrospinous ligament fixation w/xenoform bovine dermis graft augmentation .     Marland Kitchen Urine incontinence   . Vitamin D deficiency 07/20/2008   Taking 1000 iu qd    . Vitamin D deficiency, unspecified    Allergies  Allergen Reactions  . Meloxicam Swelling    Caused edema and Cr increase  . Morphine Nausea And Vomiting    SEVERE  . Other Other (See Comments)  . Sulfa Antibiotics Swelling   Past Surgical History:  Procedure Laterality Date  . ANTERIOR AND POSTERIOR VAGINAL REPAIR  04-29-2004   AND TRANSVAGINAL TAPE SLING  . ANTERIOR CERVICAL DECOMP/DISCECTOMY FUSION  1999  . APPENDECTOMY  1984  . BILATERAL SALPINGOOPHORECTOMY  1984  . Bladder stimulator    . BREAST LUMPECTOMY WITH RADIOACTIVE SEED LOCALIZATION Left 10/31/2019   Procedure: LEFT BREAST LUMPECTOMY WITH RADIOACTIVE SEED LOCALIZATION;  Surgeon: Erroll Luna, MD;  Location: [facility];  Service: General;  Laterality: Left;  . CARDIAC CATHETERIZATION  10-03-2002   DR DEGENT   NORMAL CORONARIES ARTERIES/  EF 55%  . CYSTOSCOPY N/A 08/27/2013   Procedure: CYSTOSCOPY;  Surgeon: Ailene Rud, MD;  Location[facility];  Service: Urology;  Laterality: N/A;  . CYSTOSCOPY WITH INJECTION N/A 12/17/2013   Procedure: Ascencion Dike WITH INJECTION;  Surgeon: Ailene Rud, MD;  Location: [facility];  Service: Urology;  Laterality: N/A;  . DILATION AND CURETTAGE OF UTERUS    . EXCISION RIGHT NECK AND LEFT BREAST SEBACEOUS CYST  05-18-2002  . HEMORRHOID BANDING  2014  . PUBOVAGINAL SLING N/A 10/25/2016  Procedure: Gaynelle Arabian;  Surgeon: Carolan Clines, MD;  Location: [facility];  Service: Urology;  Laterality: N/A;  . right litlle finger surgery  yrs ago   cyst removed x 4  . TOTAL ABDOMINAL HYSTERECTOMY  1986  . TUBAL LIGATION    . VAGINAL PROLAPSE REPAIR N/A 08/27/2013   Procedure: ANTERIOR VAGINAL VAULT SUSPENSION, KELLY PLICATION WITH SACROSPINOUS LIGAMENT FIXATION AND XENFORM BOVINE DERMIS GRAFT AUGMENTATION, URETHRAL EXPLORATION, URETHROLYSIS, EXPLANTATION OF TVT TAPE, IMPLANTATION OF FLOSEAL, IMPLANTATION OF XENFORM BOVINE GRAFT IN PERIURETHRAL SPACE;  Surgeon: Ailene Rud, MD;  Location: [place];  Service: Urology;  Laterality: N/A  . VAGINAL PROLAPSE REPAIR N/A 10/25/2016   Procedure: VAGINAL VAULT SUSPENSION with mesh and sacrospinous repair;  Surgeon: Carolan Clines, MD;  Location: [facility];  Service: Urology;  Laterality: N/A;   Family History  Problem Relation Age of Onset  . Lung cancer Father   . Coronary artery disease Father   . Hypertension Mother   . Heart disease Mother   . Myelodysplastic syndrome Mother   . Arthritis Mother   . Cervical cancer Mother   . Dementia Maternal Aunt   . Fibromyalgia Sister   . Hypertension Sister   . Anemia Sister   . Depression Sister   .  Hypertension Brother   . Anemia Brother   . Hypertension Brother   . Hyperlipidemia Brother   . Colon polyps Other        aunt  . Heart disease Maternal Uncle   . Lung cancer Paternal Uncle   . Kidney disease Sister        kidney stones, removed kidney  . Dementia Maternal Aunt   . Lung cancer Maternal Uncle   . Other Paternal Uncle        brain Tumor  . Pancreatic cancer Cousin   . Colon cancer Neg Hx    Social History   Social History Narrative   She is retired and divorced and has 1 child   Occasional alcohol, former smoker no drug use   Right handed    Caffeine 2-3 cups per day   Lives at alone     Allergies as of 11/05/2020      Reactions   Meloxicam Swelling   Caused edema and Cr increase   Morphine Nausea And Vomiting   SEVERE   Other Other (See Comments)   Sulfa Antibiotics Swelling      Medication List       Accurate as of November 05, 2020 10:40 AM. If you have any questions, ask your nurse or doctor.        STOP taking these medications   fluconazole 150 MG tablet Commonly known as: DIFLUCAN Stopped by: Howard Pouch, DO     TAKE these medications   atenolol 25 MG tablet Commonly known as: TENORMIN Take 0.5 tablets (12.5 mg total) by mouth daily.   calcium carbonate 600 MG Tabs tablet Commonly known as: OS-CAL Take 600 mg by mouth daily.   cetirizine 10 MG tablet Commonly known as: ZYRTEC Take 10 mg by mouth daily as needed for allergies.   cholecalciferol 25 MCG (1000 UNIT) tablet Commonly known as: VITAMIN D3 Take 1,000 Units by mouth daily.   clotrimazole 1 % cream Commonly known as: LOTRIMIN   diclofenac Sodium 1 % Gel Commonly known as: VOLTAREN Apply 1 application topically as needed.   dicyclomine 10 MG capsule Commonly known as: BENTYL TAKE 1 CAPSULE FOUR TIMES DAILY BEFORE MEALS AND AT  BEDTIME   DULoxetine 60 MG capsule Commonly known as: CYMBALTA TAKE 1 CAPSULE TWICE DAILY (NEED MD APPOINTMENT)   escitalopram 20 MG  tablet Commonly known as: LEXAPRO Take 1 tablet (20 mg total) by mouth daily.   fesoterodine 4 MG Tb24 tablet Commonly known as: TOVIAZ Take 4 mg by mouth daily.   FISH OIL PO Take 1 capsule by mouth daily.   fluticasone 50 MCG/ACT nasal spray Commonly known as: FLONASE Place into both nostrils as needed.   LORazepam 0.5 MG tablet Commonly known as: ATIVAN Take 1 tablet (0.5 mg total) by mouth every 12 (twelve) hours as needed for anxiety (for panic attack).   losartan-hydrochlorothiazide 50-12.5 MG tablet Commonly known as: HYZAAR Take 0.5 tablets by mouth daily.   omeprazole 40 MG capsule Commonly known as: PRILOSEC Take 1 capsule (40 mg total) by mouth 2 (two) times daily before a meal. 30 mins before breakfast and supper   pravastatin 40 MG tablet Commonly known as: PRAVACHOL Take 1 tablet (40 mg total) by mouth daily.   RECLAST IV Inject into the vein.   Restasis 0.05 % ophthalmic emulsion Generic drug: cycloSPORINE   tamoxifen 20 MG tablet Commonly known as: NOLVADEX Take 20 mg by mouth daily.   traMADol 50 MG tablet Commonly known as: ULTRAM Take 1 tablet (50 mg total) by mouth 2 (two) times daily as needed for moderate pain. TAKES 1 AT BEDTIME AND 1 DURING THE DAY ONLY IF NEEDED   traZODone 100 MG tablet Commonly known as: DESYREL Take 1 tablet (100 mg total) by mouth at bedtime.   Turmeric Curcumin 500 MG Caps Take 1,000 mg by mouth daily.   Vitamin B-12 1000 MCG Subl Place 1 tablet under the tongue daily.   WOMENS MULTIVITAMIN PLUS PO Take 1 tablet by mouth daily.       All past medical history, surgical history, allergies, family history, immunizations andmedications were updated in the EMR today and reviewed under the history and medication portions of their EMR.     MM Breast Surgical Specimen  Result Date: 10/31/2019 CLINICAL DATA:  Surgical excision of a recently biopsied intraductal papilloma with fibrocystic change and sclerosing  adenosis and an area of distortion in the outer left breast. EXAM: SPECIMEN RADIOGRAPH OF THE LEFT BREAST COMPARISON:  Previous exam(s). FINDINGS: Status post excision of the left breast. The radioactive seed and X shaped biopsy marker clip are present, completely intact. IMPRESSION: Specimen radiograph of the left breast. Electronically Signed   By: Claudie Revering M.D.   On: 10/31/2019 09:15     ROS: 14 pt review of systems performed and negative (unless mentioned in an HPI)  Objective: BP 119/80   Pulse 77   Temp 98.1 F (36.7 C) (Oral)   Ht 5\' 5"  (1.651 m)   Wt 191 lb (86.6 kg)   SpO2 97%   BMI 31.78 kg/m  Gen: Afebrile. No acute distress. Nontoxic in appearance, well-developed, well-nourished,  Pleasant female.  HENT: AT. [place]. Bilateral TM visualized and normal in appearance, normal external auditory canal. MMM, no oral lesions, adequate dentition. Bilateral nares within normal limits. Throat without erythema, ulcerations or exudates. no Cough on exam, CHRONIC hoarseness on exam. Eyes:Pupils Equal Round Reactive to light, Extraocular movements intact,  Conjunctiva without redness, discharge or icterus. Neck/lymp/endocrine: Supple,no lymphadenopathy, no thyromegaly CV: RRR no murmur, noedema, +2/4 P posterior tibialis pulses.  Chest: CTAB, no wheeze, rhonchi or crackles. normal Respiratory effort. good Air movement. Abd: Soft. flat. NTND. BS present.  no Masses palpated. No hepatosplenomegaly. No rebound tenderness or guarding. Skin: no rashes, purpura or petechiae. Warm and well-perfused. Skin intact. Neuro/Msk:  Normal gait. PERLA. EOMi. Alert. Oriented x3.  Cranial nerves II through XII intact. Muscle strength 5/5 upper/lower extremity. DTRs equal bilaterally. Psych: Normal affect, dress and demeanor. Normal speech. Normal thought content and judgment.   Assessment/plan: EMMALEIGH LONGO is a 75 y.o. female present for [place] Depression with anxiety/insomnia/panic d/o/benzo use Stable.   - continue  Lexapro 20 mg daily- refills provided today - continue  Cymbalta 60 mg twice a day. - continue  trazodone 100 mg daily at bedtime. - continue  Ativan provided for 6 months. NCCS database reviewed and appropriate 11/05/20 - UDS - collected today - Controlled substance contract signed. - pt declined psychology referral.   Hypertension/morbid obesity/hyperlipidemia/CKD3/palpitations: - Stable. .   - continue  losartan-HCTZ (1/2 tab) - continue atenolol to 12.5 mg daily. If palpitations worsen, can return to atenolol 25 mg daily.   - continue statin - cbc, cmp, tsh, lipids collected today - diet and exercise modifications recommended.  - Continue low-sodium diet, exercise greater than 150 minutes a week.  Vitamin D deficiency/osteoporosis:  Continue vit d supplement reclast - last tx d/t to 5 yr 01/2021  Arthritis/pain/lumbar pain: Stable - continue tramadol for chronic arthritic pain. Encouraged her to take tramadol every 12 hours daily (was only routinely taking once).  - UDS collected today - contract up-to-date. - NCCS database reviewed 11/05/20 and appropriate.  - Follow-up in 5.5 months if needing prescription refilled.   Diabetes mellitus screening - Hemoglobin A1c  Atypical lobular hyperplasia [facility]) of left breast Following with surgical team this month.   Pernicious anemia b12 levels collected tpdau  Other irritable bowel syndrome Stable.  Continue bentyl  Routine general medical examination at a health care facility Patient was encouraged to exercise greater than 150 minutes a week. Patient was encouraged to choose a diet filled with fresh fruits and vegetables, and lean meats. AVS provided to patient today for education/recommendation on gender specific health and safety maintenance. Colonoscopy: no further screenings recommended by GI- 03/2016- Dr. Carlean Purl.  PAP > 65. No longer indicated.  Mammogram (50-74): [facility]- follow ing w/ surgeon this mo.   Immunizations: Influenza vaccination up-to-date 2021, tetanus vaccination up-to-date 2021, pneumonia vaccinations completed.  Shingrix offered and patient would like prescription.Shingrix completed  Infectious disease screening: Up-to-date with recommended infectious disease screenings. DEXA: rpt due 08/2021- osteoporosis. Reclast last one this April 2022  Return in about 5 months (around 04/21/2021) for [place] (30 min) and 1 yr cpe.  Orders Placed This Encounter  Procedures  . CBC with Differential/Platelet  . Comprehensive metabolic panel  . Hemoglobin A1c  . TSH  . VITAMIN D 25 Hydroxy (Vit-D Deficiency, Fractures)  . Vitamin B12  . Lipid panel   Meds ordered this encounter  Medications  . traZODone (DESYREL) 100 MG tablet    Sig: Take 1 tablet (100 mg total) by mouth at bedtime.    Dispense:  90 tablet    Refill:  1  . pravastatin (PRAVACHOL) 40 MG tablet    Sig: Take 1 tablet (40 mg total) by mouth daily.    Dispense:  90 tablet    Refill:  3  . escitalopram (LEXAPRO) 20 MG tablet    Sig: Take 1 tablet (20 mg total) by mouth daily.    Dispense:  90 tablet    Refill:  0  . DULoxetine (CYMBALTA) 60 MG capsule  Sig: TAKE 1 CAPSULE TWICE DAILY (NEED MD APPOINTMENT)    Dispense:  180 capsule    Refill:  1  . dicyclomine (BENTYL) 10 MG capsule    Sig: TAKE 1 CAPSULE FOUR TIMES DAILY BEFORE MEALS AND AT BEDTIME    Dispense:  180 capsule    Refill:  1  . omeprazole (PRILOSEC) 40 MG capsule    Sig: Take 1 capsule (40 mg total) by mouth 2 (two) times daily before a meal. 30 mins before breakfast and supper    Dispense:  180 capsule    Refill:  3  . atenolol (TENORMIN) 25 MG tablet    Sig: Take 0.5 tablets (12.5 mg total) by mouth daily.    Dispense:  45 tablet    Refill:  1    Please DC other scripts for this medication  . losartan-hydrochlorothiazide (HYZAAR) 50-12.5 MG tablet    Sig: Take 0.5 tablets by mouth daily.    Dispense:  45 tablet    Refill:  1    DC prior  scripts for this medication. Thanks.  Marland Kitchen LORazepam (ATIVAN) 0.5 MG tablet    Sig: Take 1 tablet (0.5 mg total) by mouth every 12 (twelve) hours as needed for anxiety (for panic attack).    Dispense:  180 tablet    Refill:  1  . traMADol (ULTRAM) 50 MG tablet    Sig: Take 1 tablet (50 mg total) by mouth 2 (two) times daily as needed for moderate pain. TAKES 1 AT BEDTIME AND 1 DURING THE DAY ONLY IF NEEDED    Dispense:  180 tablet    Refill:  1   Referral Orders  No referral(s) requested today     Note is dictated utilizing voice recognition software. Although note has been proof read prior to signing, occasional typographical errors still can be missed. If any questions arise, please do not hesitate to call for verification.  Electronically signed by: Howard Pouch, DO [place]

## 2020-11-28 DIAGNOSIS — Z9189 Other specified personal risk factors, not elsewhere classified: Secondary | ICD-10-CM | POA: Diagnosis not present

## 2020-12-03 ENCOUNTER — Other Ambulatory Visit: Payer: Self-pay | Admitting: Surgery

## 2020-12-03 DIAGNOSIS — Z9189 Other specified personal risk factors, not elsewhere classified: Secondary | ICD-10-CM

## 2020-12-04 DIAGNOSIS — M2550 Pain in unspecified joint: Secondary | ICD-10-CM | POA: Diagnosis not present

## 2020-12-04 DIAGNOSIS — Z6841 Body Mass Index (BMI) 40.0 and over, adult: Secondary | ICD-10-CM | POA: Diagnosis not present

## 2020-12-04 DIAGNOSIS — M810 Age-related osteoporosis without current pathological fracture: Secondary | ICD-10-CM | POA: Diagnosis not present

## 2020-12-04 DIAGNOSIS — M25561 Pain in right knee: Secondary | ICD-10-CM | POA: Diagnosis not present

## 2020-12-04 DIAGNOSIS — M797 Fibromyalgia: Secondary | ICD-10-CM | POA: Diagnosis not present

## 2020-12-04 DIAGNOSIS — M150 Primary generalized (osteo)arthritis: Secondary | ICD-10-CM | POA: Diagnosis not present

## 2020-12-10 ENCOUNTER — Other Ambulatory Visit: Payer: Self-pay | Admitting: Family Medicine

## 2020-12-10 DIAGNOSIS — Z1231 Encounter for screening mammogram for malignant neoplasm of breast: Secondary | ICD-10-CM

## 2020-12-25 ENCOUNTER — Telehealth: Payer: Self-pay | Admitting: Family Medicine

## 2020-12-25 NOTE — Telephone Encounter (Signed)
Chart note: Patient requested refill of Clotrimazole, states the rash under her breast is coming back. I let patient know she would need appointment because the last time she discussed this with Dr. Raoul Pitch was in December. Patient states she will call back for appointment next week if she still feels that she needs another RX of clotrimazole.

## 2020-12-25 NOTE — Telephone Encounter (Signed)
fyi

## 2021-01-21 ENCOUNTER — Telehealth: Payer: Self-pay

## 2021-01-21 NOTE — Telephone Encounter (Signed)
rx declined due to not being appropriate   Pt will need an appt if desiring new rx

## 2021-01-21 NOTE — Telephone Encounter (Signed)
Patient's mail order pharmacy called to check status of recent request that was faxed to Dr. Raoul Pitch.  fluconazole (DIFLUCAN) 150 MG tablet [[id number]]    [place]

## 2021-01-22 ENCOUNTER — Telehealth: Payer: Self-pay | Admitting: Family Medicine

## 2021-01-22 NOTE — Telephone Encounter (Signed)
Patient called to request recent labs sent to [facility]. Verified caller from number listed in chart. Printed recent labs and faxed to number provided. Fax confirmation received.

## 2021-01-26 DIAGNOSIS — M810 Age-related osteoporosis without current pathological fracture: Secondary | ICD-10-CM | POA: Diagnosis not present

## 2021-01-27 DIAGNOSIS — M810 Age-related osteoporosis without current pathological fracture: Secondary | ICD-10-CM | POA: Diagnosis not present

## 2021-02-02 ENCOUNTER — Ambulatory Visit (INDEPENDENT_AMBULATORY_CARE_PROVIDER_SITE_OTHER): Payer: Medicare HMO | Admitting: Family Medicine

## 2021-02-02 ENCOUNTER — Encounter: Payer: Self-pay | Admitting: Family Medicine

## 2021-02-02 ENCOUNTER — Other Ambulatory Visit: Payer: Self-pay

## 2021-02-02 VITALS — BP 118/75 | HR 69 | Temp 97.5°F | Ht 65.0 in | Wt 199.0 lb

## 2021-02-02 DIAGNOSIS — R2681 Unsteadiness on feet: Secondary | ICD-10-CM

## 2021-02-02 DIAGNOSIS — Z9181 History of falling: Secondary | ICD-10-CM | POA: Diagnosis not present

## 2021-02-02 DIAGNOSIS — N183 Chronic kidney disease, stage 3 unspecified: Secondary | ICD-10-CM | POA: Diagnosis not present

## 2021-02-02 DIAGNOSIS — R252 Cramp and spasm: Secondary | ICD-10-CM | POA: Diagnosis not present

## 2021-02-02 DIAGNOSIS — M797 Fibromyalgia: Secondary | ICD-10-CM

## 2021-02-02 LAB — COMPREHENSIVE METABOLIC PANEL
ALT: 12 U/L (ref 0–35)
AST: 12 U/L (ref 0–37)
Albumin: 4.1 g/dL (ref 3.5–5.2)
Alkaline Phosphatase: 51 U/L (ref 39–117)
BUN: 29 mg/dL — ABNORMAL HIGH (ref 6–23)
CO2: 29 mEq/L (ref 19–32)
Calcium: 9.2 mg/dL (ref 8.4–10.5)
Chloride: 103 mEq/L (ref 96–112)
Creatinine, Ser: 1.7 mg/dL — ABNORMAL HIGH (ref 0.40–1.20)
GFR: 29.49 mL/min — ABNORMAL LOW (ref >60.00)
Glucose, Bld: 69 mg/dL — ABNORMAL LOW (ref 70–99)
Potassium: 3.9 mEq/L (ref 3.5–5.1)
Sodium: 142 mEq/L (ref 135–145)
Total Bilirubin: 0.4 mg/dL (ref 0.2–1.2)
Total Protein: 6.7 g/dL (ref 6.0–8.3)

## 2021-02-02 LAB — TSH: TSH: 2.54 u[IU]/mL (ref 0.35–4.50)

## 2021-02-02 NOTE — Patient Instructions (Addendum)
  Great to see you today.  I have refilled the medication(s) we provide.   If labs were collected, we will inform you of lab results once received either by echart message or telephone call.   - echart message- for normal results that have been seen by the patient already.   - telephone call: abnormal results or if patient has not viewed results in their echart.   Stretches will help.  - touch toes.  - lean against wall and stretch leg  I will refer you to physical therapy as well.

## 2021-02-02 NOTE — Progress Notes (Signed)
This visit occurred during the SARS-CoV-2 public health emergency.  Safety protocols were in place, including screening questions prior to the visit, additional usage of staff PPE, and extensive cleaning of exam room while observing appropriate contact time as indicated for disinfecting solutions.    [name] [name] , 1946-09-23, 75 y.o., female MRN: [id number] Patient Care Team    Relationship Specialty Notifications Start End  Ma Hillock, DO PCP - General Family Medicine  06/02/15   Gatha Mayer, MD Consulting Physician Gastroenterology  11/24/15   Calvert Cantor, MD Consulting Physician Ophthalmology  11/24/15   Specialists, [facility]th[facility]  04/12/16   Pa, [facility]    01/16/18   Rheumatology, [facility]    01/16/18   Kathrynn Ducking, MD Consulting Physician Neurology  10/24/18     Chief Complaint  Patient presents with  . Leg Pain    Pt c/o b/l leg pain R> x 1 mo; pt describe pain as cramping worsen within last week     Subjective: Pt presents for an OV with complaints of bilateral leg pain R> of 1 month duration.  Associated symptoms include cramping pain in her thighs, but can be her lower leg or foot at times. Mostly occur in the right leg and at night, but can occur in the day. She reports she had a a cramp that was so painful last week, that her muscle was sore the next day. She denies palpitations or shortness of breath.  She endorses a fall recently and feeling like she does not have great balance. She is concerned she will fall. She had completed PT a few years ago and willing to try again to help with balance.   Depression screen [facility] 2/9 02/04/2021 07/22/2020 06/25/2020 04/24/2020 10/10/2019  Decreased Interest 0 0 0 1 0  Down, Depressed, Hopeless 1 0 0 1 0  PHQ - 2 Score 1 0 0 2 0  Altered sleeping 1 - - 1 0  Tired, decreased energy 1 - - 3 1  Change in appetite 0 - - 0 0  Feeling bad or failure about yourself  0 - - 0 0   Trouble concentrating 1 - - 1 0  Moving slowly or fidgety/restless 0 - - 0 0  Suicidal thoughts 0 - - 0 0  PHQ-9 Score 4 - - 7 1  Difficult doing work/chores Somewhat difficult - - Somewhat difficult Not difficult at all  Some recent data might be hidden    Allergies  Allergen Reactions  . Meloxicam Swelling    Caused edema and Cr increase  . Morphine Nausea And Vomiting    SEVERE  . Other Other (See Comments)  . Sulfa Antibiotics Swelling   Social History   Social History Narrative   She is retired and divorced and has 1 child   Occasional alcohol, former smoker no drug use   Right handed    Caffeine 2-3 cups per day   Lives at alone    Past Medical History:  Diagnosis Date  . Abnormal mammogram 08/17/2017   08/17/2017--> will need [place]  . Anemia due to other cause 04/16/2014   Family Hx of beta thalassemia & mother had myelodysplasia, MGF leukemia. Historical treatment: B12 inj. B12 levels are sustained on oral B complex.  Dropped twice in last yr-lowest 10.8. Improved from 10.8 to 11.8 after starting daily iron supplement.   She is c/o episodes of intense abd pain that lasts  2-3 days. She is taking bentyl with relief after day 2 or 3.     . Anxiety   . Arthritis    oa  . Atypical lobular hyperplasia (ALH) of left breast 12/06/2019  . Cardiac murmur 08/06/2020  . Chronic cystitis   . CKD (chronic kidney disease) stage 3, GFR 30-59 ml/min (HCC) 04/13/2017  . 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 DDD (degenerative disc disease), cervical   . Depression   . Depression with anxiety 11/04/2007   Current med: xanax PRN, zoloft 100 mg qd.    . Dizzy spells 08/06/2020  . Dysphagia 07/28/2018  . Essential hypertension 11/04/2007   Current meds: lisinopril/HCTZ 10/12.5 mg qd; tenormin 25 mg qd    . Fibromyalgia   . Gait instability 01/22/2019  . GERD (gastroesophageal reflux disease)   . Headache    sinus  . Heart murmur   . Hemorrhoids, internal, with  bleeding 06/26/2014   Gr 2 all 3 positions    . Hemorrhoids, internal, with bleeding 06/26/2014   Gr 2 all 3 positions    . History of duodenal ulcer    2007  . History of gastritis    2007  . Hoarseness 08/22/2015   GI- did not feel gerd, ENT did not feel ent, neuro- felt possibly ent--> sent for 2nd opinion ent.   Marland Kitchen HTN (hypertension)   . Hyperlipidemia   . Hypertension, essential   . Hypertriglyceridemia 07/31/2014  . Insomnia 07/25/2015  . Internal and external hemorrhoids without complication   . Internal and external hemorrhoids without complication   . Intrinsic (urethral) sphincter deficiency (ISD)   . Irritable bowel syndrome   . Long term prescription benzodiazepine use 10/24/2018  . Lumbar pain with radiation down right leg 10/24/2018  . Medicare annual wellness visit, subsequent 11/24/2015  . Morbid obesity ([place]) 09/11/2015  . 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    . Osteopenia   . Osteoporosis 02/06/2014   The BMD measured at [facility] L2-L3 is 0.878 g/cm2 with a T-score of -2.7. This patient is considered osteoporotic according to [facility]) criteria. L-1, L-4 were excluded due to degenerative changes. Per the official positions of the ISCD, it is not possible to quantitatively compare BMD or calculate an [facility] between exams done at different facilities.   . Pain management contract agreement 10/24/2018  . Palpitations 04/20/2018  . Panic disorder 10/14/2016  . Pernicious anemia   . Pneumonia   . PONV (postoperative nausea and vomiting)   . PONV (postoperative nausea and vomiting)   . Primary osteoarthritis of foot 10/24/2018  . Primary osteoarthritis of left knee 03/19/2015   Now with murphy wainer. MRI scheduled. 04/16/2016   . Seasonal allergies 02/19/2016  . Unspecified essential hypertension   . Unspecified venous (peripheral) insufficiency    wears compression hose  . Unspecified vitamin D deficiency   . Urinary incontinence  01/30/2014   Followed by Dr Gaynelle Arabian: Reviewed note 03/05/2015: mixed incontinence. Failed myrbetriq. stress incontinence, recurrent cystocele,f/u 6 mos for pelvic exam  Status post anterior vault suspension w/kelly plication & sacrospinous ligament fixation w/xenoform bovine dermis graft augmentation .     Marland Kitchen Urine incontinence   . Vitamin D deficiency 07/20/2008   Taking 1000 iu qd    . Vitamin D deficiency, unspecified    Past Surgical History:  Procedure Laterality Date  . ANTERIOR AND POSTERIOR VAGINAL REPAIR  04-29-2004   AND TRANSVAGINAL TAPE SLING  .  ANTERIOR CERVICAL DECOMP/DISCECTOMY FUSION  1999  . APPENDECTOMY  1984  . BILATERAL SALPINGOOPHORECTOMY  1984  . Bladder stimulator    . BREAST LUMPECTOMY WITH RADIOACTIVE SEED LOCALIZATION Left 10/31/2019   Procedure: LEFT BREAST LUMPECTOMY WITH RADIOACTIVE SEED LOCALIZATION;  Surgeon: Erroll Luna, MD;  Location: [place];  Service: General;  Laterality: Left;  . CARDIAC CATHETERIZATION  10-03-2002   DR DEGENT   NORMAL CORONARIES ARTERIES/  EF 55%  . CYSTOSCOPY N/A 08/27/2013   Procedure: CYSTOSCOPY;  Surgeon: Ailene Rud, MD;  Location: [facility];  Service: Urology;  Laterality: N/A;  . CYSTOSCOPY WITH INJECTION N/A 12/17/2013   Procedure: Ascencion Dike WITH INJECTION;  Surgeon: Ailene Rud, MD;  Location: [facility];  Service: Urology;  Laterality: N/A;  . DILATION AND CURETTAGE OF UTERUS    . EXCISION RIGHT NECK AND LEFT BREAST SEBACEOUS CYST  05-18-2002  . HEMORRHOID BANDING  2014  . PUBOVAGINAL SLING N/A 10/25/2016   Procedure: Gaynelle Arabian;  Surgeon: Carolan Clines, MD;  Location: [facility];  Service: Urology;  Laterality: N/A;  . right litlle finger surgery  yrs ago   cyst removed x 4  . TOTAL ABDOMINAL HYSTERECTOMY  1986  . TUBAL LIGATION    . VAGINAL PROLAPSE REPAIR N/A 08/27/2013   Procedure: ANTERIOR VAGINAL VAULT SUSPENSION, KELLY PLICATION  WITH SACROSPINOUS LIGAMENT FIXATION AND XENFORM BOVINE DERMIS GRAFT AUGMENTATION, URETHRAL EXPLORATION, URETHROLYSIS, EXPLANTATION OF TVT TAPE, IMPLANTATION OF FLOSEAL, IMPLANTATION OF XENFORM BOVINE GRAFT IN PERIURETHRAL SPACE;  Surgeon: Ailene Rud, MD;  Location: [place];  Service: Urology;  Laterality: N/A  . VAGINAL PROLAPSE REPAIR N/A 10/25/2016   Procedure: VAGINAL VAULT SUSPENSION with mesh and sacrospinous repair;  Surgeon: Carolan Clines, MD;  Location: [facility];  Service: Urology;  Laterality: N/A;   Family History  Problem Relation Age of Onset  . Lung cancer Father   . Coronary artery disease Father   . Hypertension Mother   . Heart disease Mother   . Myelodysplastic syndrome Mother   . Arthritis Mother   . Cervical cancer Mother   . Dementia Maternal Aunt   . Fibromyalgia Sister   . Hypertension Sister   . Anemia Sister   . Depression Sister   . Hypertension Brother   . Anemia Brother   . Hypertension Brother   . Hyperlipidemia Brother   . Colon polyps Other        aunt  . Heart disease Maternal Uncle   . Lung cancer Paternal Uncle   . Kidney disease Sister        kidney stones, removed kidney  . Dementia Maternal Aunt   . Lung cancer Maternal Uncle   . Other Paternal Uncle        brain Tumor  . Pancreatic cancer Cousin   . Colon cancer Neg Hx    Allergies as of 02/02/2021      Reactions   Meloxicam Swelling   Caused edema and Cr increase   Morphine Nausea And Vomiting   SEVERE   Other Other (See Comments)   Sulfa Antibiotics Swelling      Medication List       Accurate as of Feb 02, 2021 11:59 PM. If you have any questions, ask your nurse or doctor.        atenolol 25 MG tablet Commonly known as: TENORMIN Take 0.5 tablets (12.5 mg total) by mouth daily.   calcium carbonate 600 MG Tabs tablet Commonly  known as: OS-CAL Take 600 mg by mouth daily.   cetirizine 10 MG tablet Commonly known as: ZYRTEC Take 10 mg by mouth  daily as needed for allergies.   cholecalciferol 25 MCG (1000 UNIT) tablet Commonly known as: VITAMIN D3 Take 1,000 Units by mouth daily.   clotrimazole 1 % cream Commonly known as: LOTRIMIN   diclofenac Sodium 1 % Gel Commonly known as: VOLTAREN Apply 1 application topically as needed.   dicyclomine 10 MG capsule Commonly known as: BENTYL TAKE 1 CAPSULE FOUR TIMES DAILY BEFORE MEALS AND AT BEDTIME   DULoxetine 60 MG capsule Commonly known as: CYMBALTA TAKE 1 CAPSULE TWICE DAILY (NEED MD APPOINTMENT)   escitalopram 20 MG tablet Commonly known as: LEXAPRO Take 1 tablet (20 mg total) by mouth daily.   fesoterodine 4 MG Tb24 tablet Commonly known as: TOVIAZ Take 4 mg by mouth daily.   FISH OIL PO Take 1 capsule by mouth daily.   fluticasone 50 MCG/ACT nasal spray Commonly known as: FLONASE Place into both nostrils as needed.   LORazepam 0.5 MG tablet Commonly known as: ATIVAN Take 1 tablet (0.5 mg total) by mouth every 12 (twelve) hours as needed for anxiety (for panic attack).   losartan-hydrochlorothiazide 50-12.5 MG tablet Commonly known as: HYZAAR Take 0.5 tablets by mouth daily.   omeprazole 40 MG capsule Commonly known as: PRILOSEC Take 1 capsule (40 mg total) by mouth 2 (two) times daily before a meal. 30 mins before breakfast and supper   pravastatin 40 MG tablet Commonly known as: PRAVACHOL Take 1 tablet (40 mg total) by mouth daily.   RECLAST IV Inject into the vein.   Restasis 0.05 % ophthalmic emulsion Generic drug: cycloSPORINE   tamoxifen 20 MG tablet Commonly known as: NOLVADEX Take 20 mg by mouth daily.   traMADol 50 MG tablet Commonly known as: ULTRAM Take 1 tablet (50 mg total) by mouth 2 (two) times daily as needed for moderate pain. TAKES 1 AT BEDTIME AND 1 DURING THE DAY ONLY IF NEEDED   traZODone 100 MG tablet Commonly known as: DESYREL Take 1 tablet (100 mg total) by mouth at bedtime.   Turmeric Curcumin 500 MG Caps Take 1,000  mg by mouth daily.   Vitamin B-12 1000 MCG Subl Place 1 tablet under the tongue daily.   WOMENS MULTIVITAMIN PLUS PO Take 1 tablet by mouth daily.       All past medical history, surgical history, allergies, family history, immunizations andmedications were updated in the EMR today and reviewed under the history and medication portions of their EMR.     ROS: Negative, with the exception of above mentioned in HPI   Objective:  BP 118/75   Pulse 69   Temp (!) 97.5 F (36.4 C) (Oral)   Ht 5\' 5"  (1.651 m)   Wt 199 lb (90.3 kg)   SpO2 98%   BMI 33.12 kg/m  Body mass index is 33.12 kg/m. Gen: Afebrile. No acute distress. Nontoxic in appearance, well developed, well nourished.  HENT: AT. [place].  Eyes:Pupils Equal Round Reactive to light, Extraocular movements intact,  Conjunctiva without redness, discharge or icterus. Neck/lymp/endocrine: Supple,no lymphadenopathy, no thyroid enlargement.  CV: RRR no murmur, no edema Chest: CTAB, no wheeze or crackles. Good air movement, normal resp effort.  MSK: FROM bilateral LE. No TTP. No swelling or redness. Negative homan's.  Skin: no rashes, purpura or petechiae.  Neuro:  Normal gait. PERLA. EOMi. Alert. Oriented x3  Psych: Normal affect, dress and demeanor. Normal  speech. Normal thought content and judgment.  No exam data present No results found. No results found for this or any previous visit (from the past 24 hour(s)).  Assessment/Plan: KEALEE HEINLY is a 75 y.o. female present for OV for  Muscle cramps HYDRATE! Will check labs today for elytes, iron, pth/ca and tsh causes.  - Comprehensive metabolic panel - Iron, TIBC and Ferritin Panel - PTH, Intact and Calcium - TSH Consider baclofen or MR if needed, otherwise would encouraged hydration and stretches.   Stage 3 chronic kidney disease, unspecified whether stage 3a or 3b CKD ([place]) Renally dose medications and monitor pth/ca/vit d Avoid nephrotoxins.  HYDRATE cmp  Gait  instability/At moderate risk for fall - Ambulatory referral to Physical Therapy     Reviewed expectations re: course of current medical issues.  Discussed self-management of symptoms.  Outlined signs and symptoms indicating need for more acute intervention.  Patient verbalized understanding and all questions were answered.  Patient received an After-Visit Summary.    Orders Placed This Encounter  Procedures  . Comprehensive metabolic panel  . Iron, TIBC and Ferritin Panel  . PTH, Intact and Calcium  . TSH  . Ambulatory referral to Physical Therapy   No orders of the defined types were placed in this encounter.   Referral Orders     Ambulatory referral to Physical Therapy   Note is dictated utilizing voice recognition software. Although note has been proof read prior to signing, occasional typographical errors still can be missed. If any questions arise, please do not hesitate to call for verification.   electronically signed by:  Howard Pouch, DO  [facility]

## 2021-02-03 ENCOUNTER — Telehealth: Payer: Self-pay | Admitting: Family Medicine

## 2021-02-03 ENCOUNTER — Ambulatory Visit
Admission: RE | Admit: 2021-02-03 | Discharge: 2021-02-03 | Disposition: A | Discharge status: Home / Self Care | Payer: Medicare HMO | Source: Ambulatory Visit | Attending: Family Medicine | Admitting: Family Medicine

## 2021-02-03 DIAGNOSIS — Z1231 Encounter for screening mammogram for malignant neoplasm of breast: Secondary | ICD-10-CM | POA: Diagnosis not present

## 2021-02-03 LAB — IRON,TIBC AND FERRITIN PANEL
%SAT: 16 % (calc) (ref 16–45)
Ferritin: 40 ng/mL (ref 16–288)
Iron: 64 ug/dL (ref 45–160)
TIBC: 400 mcg/dL (calc) (ref 250–450)

## 2021-02-03 LAB — PTH, INTACT AND CALCIUM
Calcium: 9.2 mg/dL (ref 8.6–10.4)
PTH: 33 pg/mL (ref 16–77)

## 2021-02-03 NOTE — Telephone Encounter (Signed)
Please inform patient: Her labs show she has worsening kidney function.  This is very possibly secondary to lack of hydration. Would encourage her to increase her water and or propel daily, at least 60 ounces of fluid a day.  Follow-up in 1 week with provider.  We will need to repeat her labs during that time and if her kidney function remains decreased we are going to have to adjust some of her medication doses for safety.  I believe her leg cramps are coming from being dehydrated.

## 2021-02-03 NOTE — Telephone Encounter (Signed)
Attempted to call pt Unable to LVM 

## 2021-02-04 ENCOUNTER — Other Ambulatory Visit: Payer: Self-pay | Admitting: Family Medicine

## 2021-02-04 DIAGNOSIS — Z9181 History of falling: Secondary | ICD-10-CM | POA: Insufficient documentation

## 2021-02-04 DIAGNOSIS — R928 Other abnormal and inconclusive findings on diagnostic imaging of breast: Secondary | ICD-10-CM

## 2021-02-04 HISTORY — DX: History of falling: Z91.81

## 2021-02-04 NOTE — Telephone Encounter (Signed)
Spoke with pt regarding labs and instructions.   

## 2021-02-11 ENCOUNTER — Ambulatory Visit (INDEPENDENT_AMBULATORY_CARE_PROVIDER_SITE_OTHER): Payer: Medicare HMO | Admitting: Family Medicine

## 2021-02-11 ENCOUNTER — Encounter: Payer: Self-pay | Admitting: Family Medicine

## 2021-02-11 ENCOUNTER — Other Ambulatory Visit: Payer: Self-pay

## 2021-02-11 VITALS — BP 108/71 | HR 74 | Temp 98.0°F | Ht 65.0 in | Wt 199.0 lb

## 2021-02-11 DIAGNOSIS — N179 Acute kidney failure, unspecified: Secondary | ICD-10-CM

## 2021-02-11 LAB — BASIC METABOLIC PANEL
BUN: 25 mg/dL — ABNORMAL HIGH (ref 6–23)
CO2: 30 mEq/L (ref 19–32)
Calcium: 9 mg/dL (ref 8.4–10.5)
Chloride: 105 mEq/L (ref 96–112)
Creatinine, Ser: 1.42 mg/dL — ABNORMAL HIGH (ref 0.40–1.20)
GFR: 36.59 mL/min — ABNORMAL LOW (ref >60.00)
Glucose, Bld: 84 mg/dL (ref 70–99)
Potassium: 4.1 mEq/L (ref 3.5–5.1)
Sodium: 142 mEq/L (ref 135–145)

## 2021-02-11 LAB — URIC ACID: Uric Acid, Serum: 3.5 mg/dL (ref 2.4–7.0)

## 2021-02-11 IMAGING — MG MM DIGITAL DIAGNOSTIC UNILAT*L* W/ TOMO W/ CAD
6 of 10 series · 6 of 30 positions shown · non-contrast
Comparison: Previous exam(s).

CLINICAL DATA: The patient was called back for a possible mass with
distortion in the left breast.

EXAM:
DIGITAL DIAGNOSTIC LEFT MAMMOGRAM WITH TOMO
ULTRASOUND LEFT BREAST

[L CC synth-2D (1 of 4)]
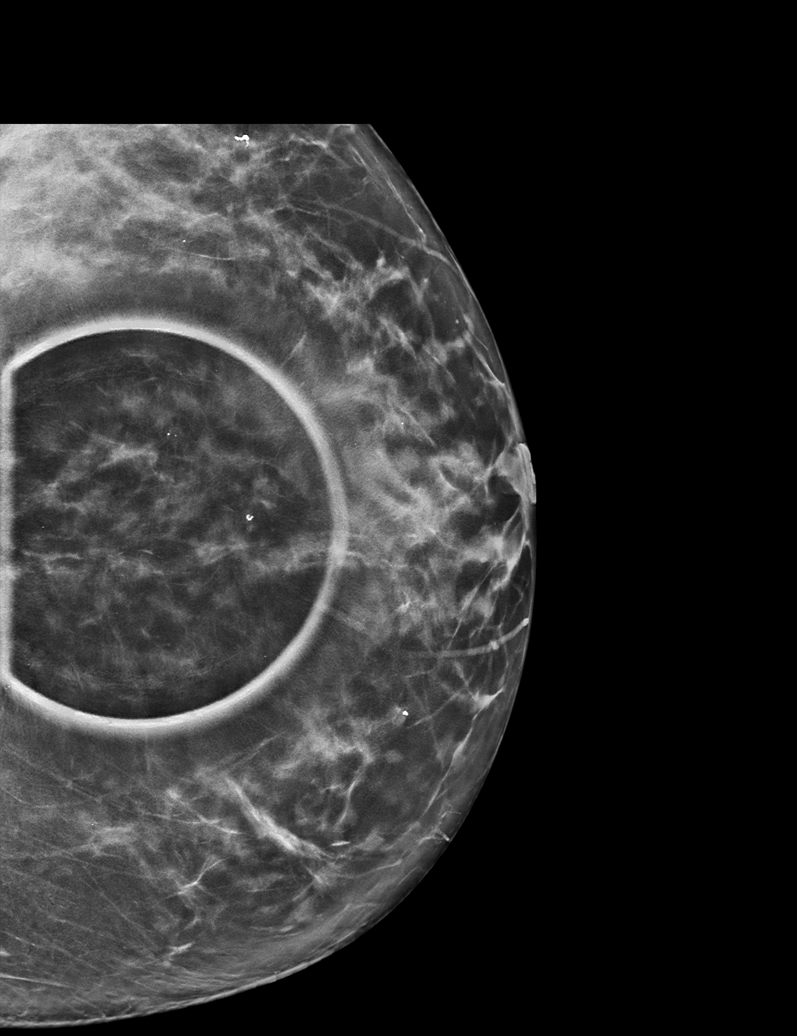

[L ML synth-2D]
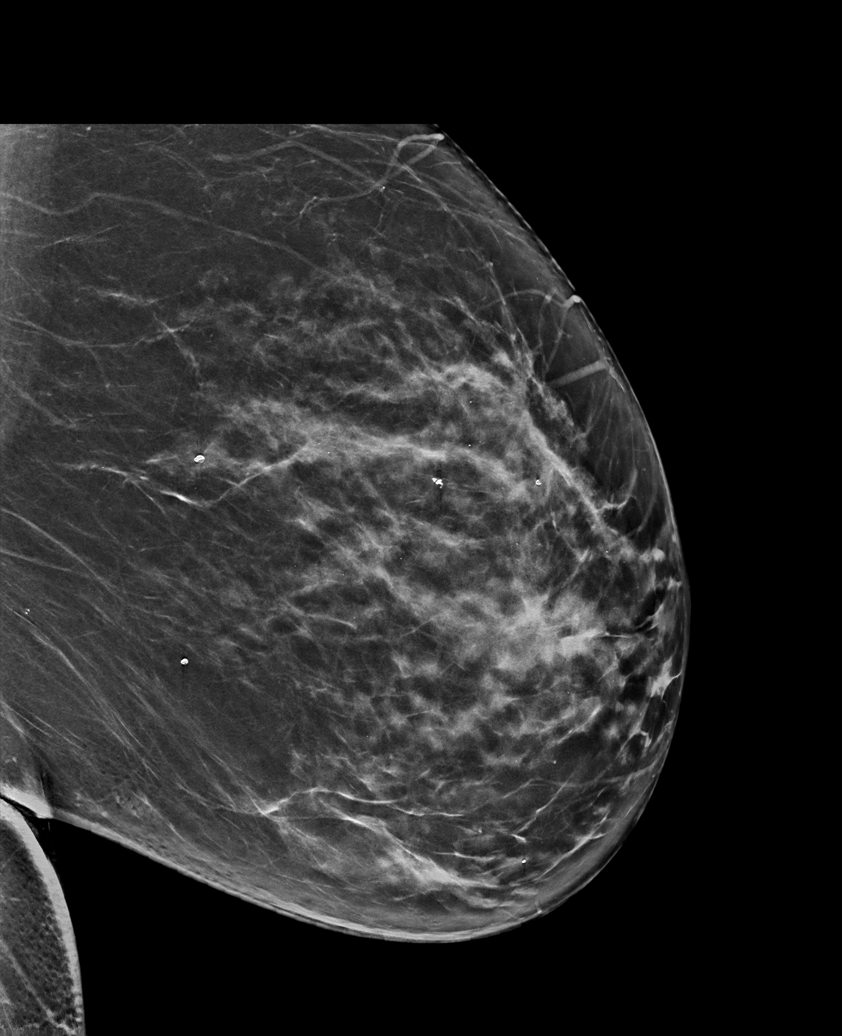

[L CC synth-2D (2 of 4)]
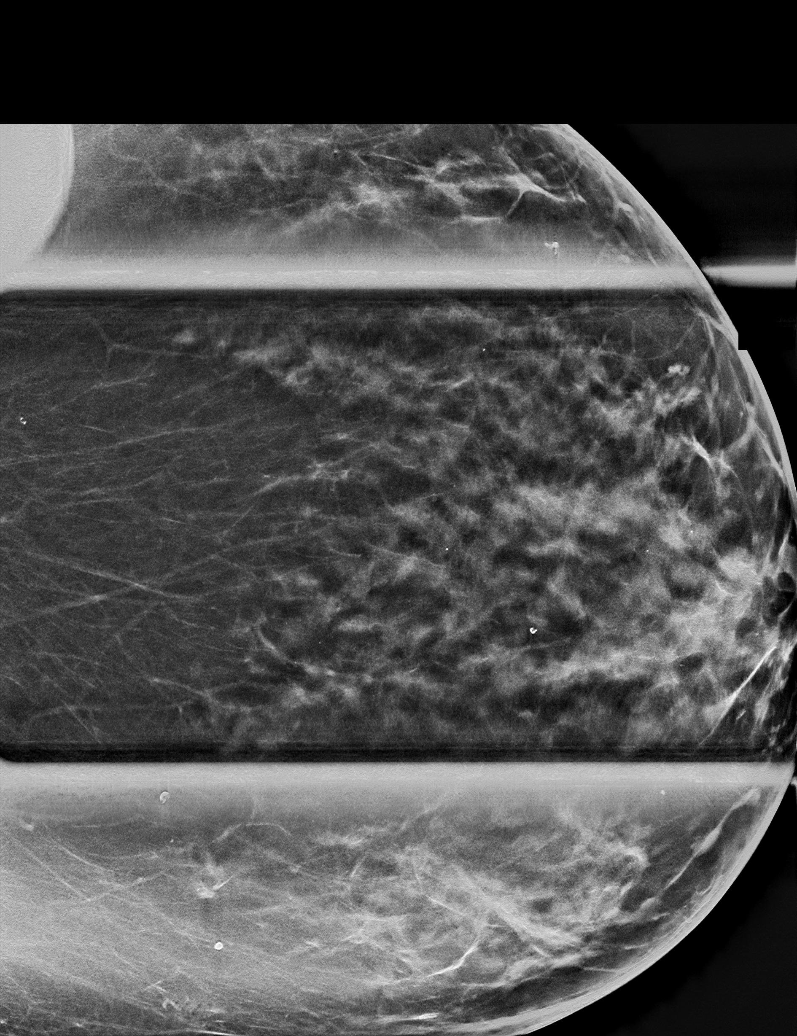

[L CC synth-2D (3 of 4)]
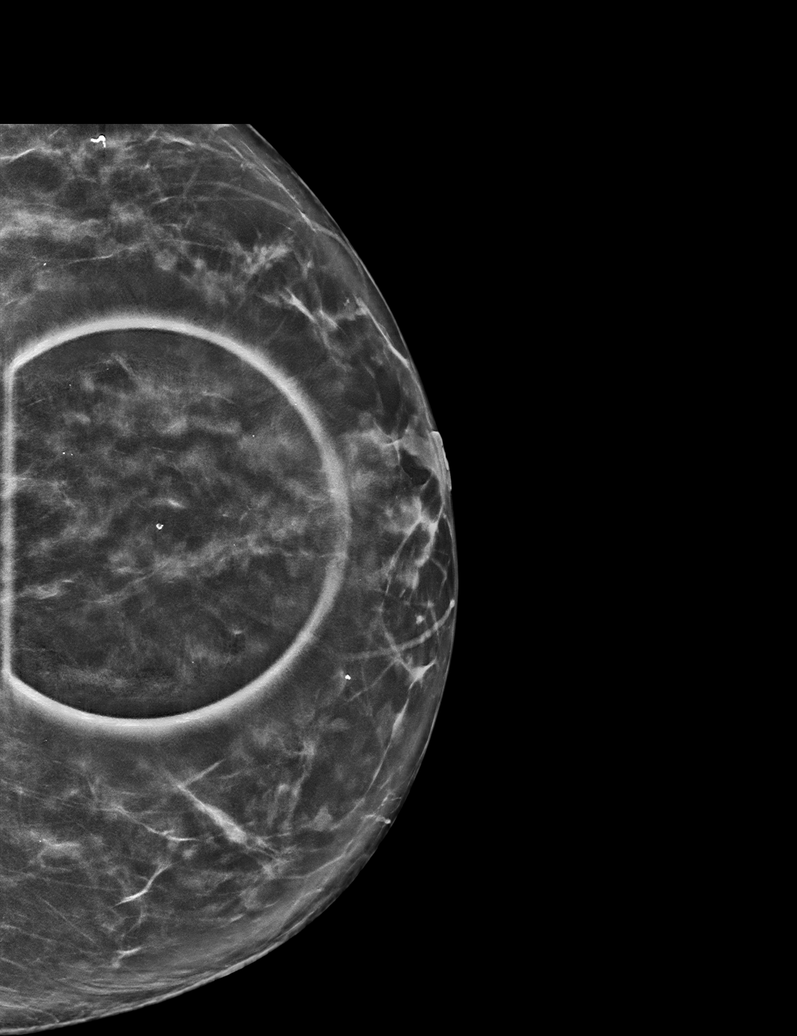

[L CC synth-2D (4 of 4)]
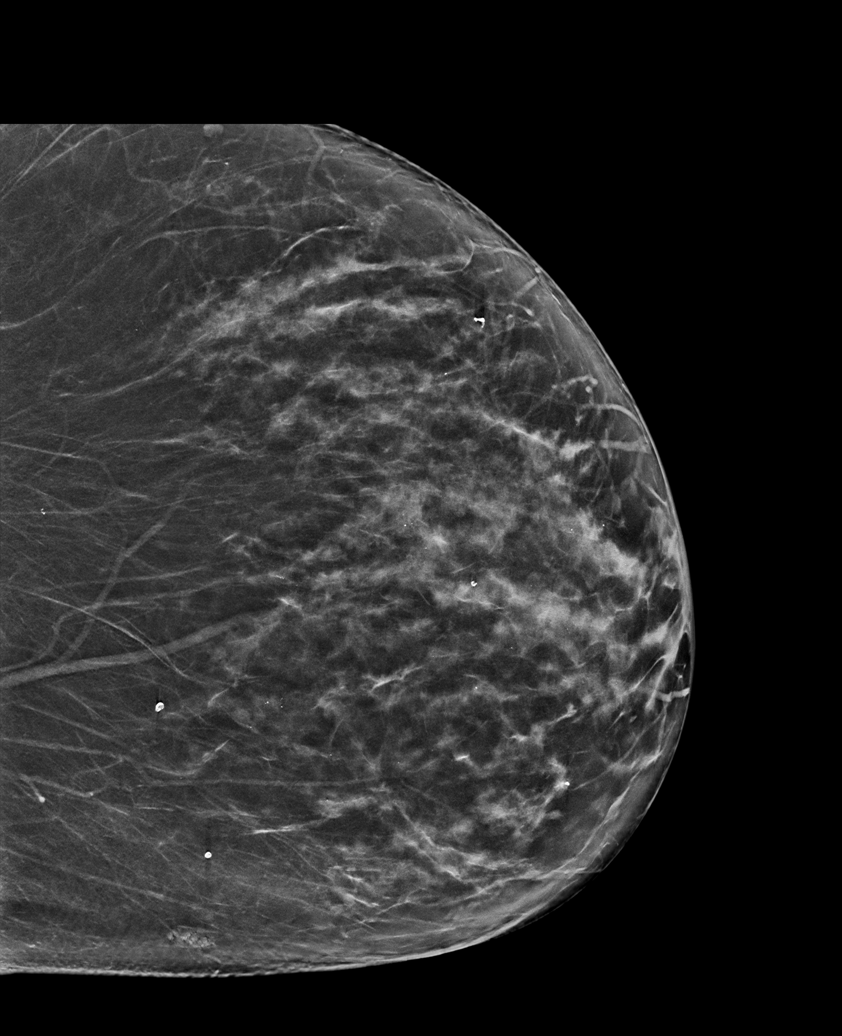

[L CC tomo · tomo slice 35/70.0]
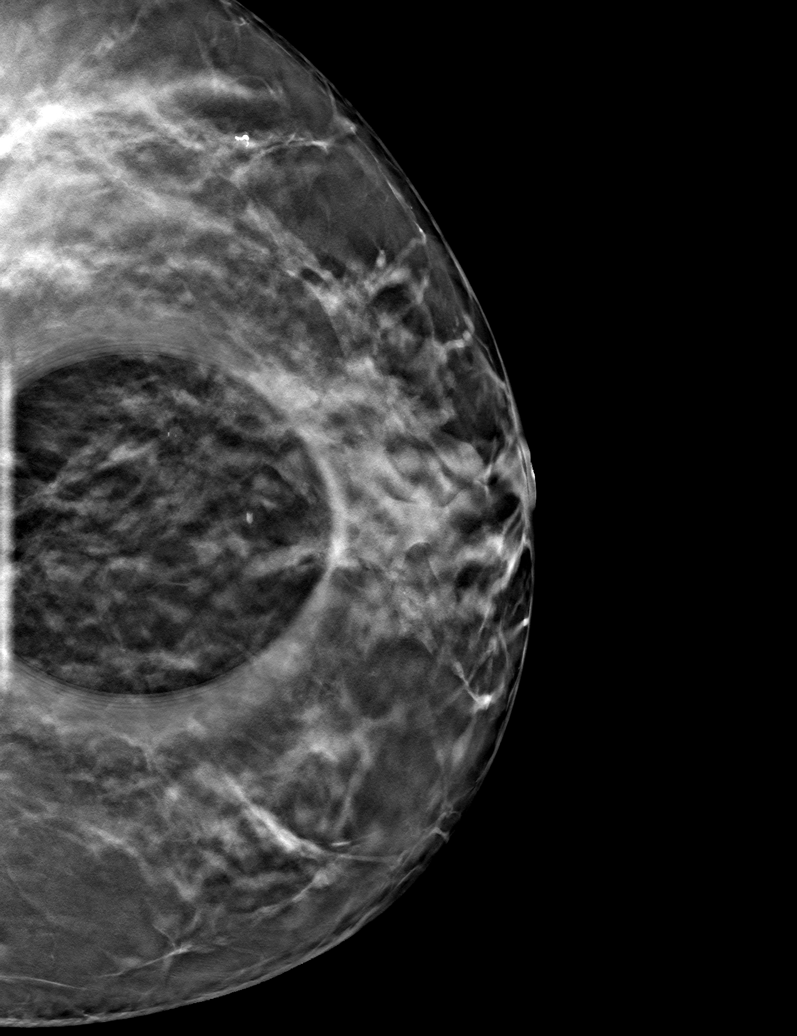

[6 of 30 positions shown; findings below may reference images not displayed]

ACR Breast Density Category c: The breast tissue is heterogeneously
dense, which may obscure small masses.
FINDINGS: There appear to be obscured masses in the lateral left breast. In
the region of the obscured masses, possible distortion remains as
seen on the whole paddle cc tomo view, image 44.

On physical exam, no suspicious lumps are identified.

Targeted ultrasound is performed, showing fibrocystic changes and
dilated milk ducts. No suspicious masses. No axillary adenopathy.
IMPRESSION: Possible distortion remains in the left breast, best seen on the CC
view, tomographic image 44. Fibrocystic changes and dilated ducts
are seen as well. No axillary adenopathy.

RECOMMENDATION:
Stereotactic biopsy of the possible left breast distortion.

I have discussed the findings and recommendations with the patient.
If applicable, a reminder letter will be sent to the patient
regarding the next appointment.

BI-RADS CATEGORY  4: Suspicious.

## 2021-02-11 NOTE — Progress Notes (Signed)
This visit occurred during the SARS-CoV-2 public health emergency.  Safety protocols were in place, including screening questions prior to the visit, additional usage of staff PPE, and extensive cleaning of exam room while observing appropriate contact time as indicated for disinfecting solutions.    [name] [name] , Mar 31, 1946, 75 y.o., female MRN: [id number] Patient Care Team    Relationship Specialty Notifications Start End  Ma Hillock, DO PCP - General Family Medicine  06/02/15   [name] Mayer, MD Consulting Physician Gastroenterology  11/24/15   Calvert Cantor, MD Consulting Physician Ophthalmology  11/24/15   Specialists, [facility]  Orthopedic Surgery  04/12/16   Pa, [facility]    01/16/18   Rheumatology, [facility]    01/16/18   Kathrynn Ducking, MD Consulting Physician Neurology  10/24/18     Chief Complaint  Patient presents with  . kidney function      Subjective: [name] [name] is a 75 y.o. female present for follow up on her muscle cramps and AKI. She has been increasing her propel to 32 ounces a day. She admits her leg cramps have resolved. Her kidney fx was found to be lower than her CKD d/t AKI with a cr of 1.7 (baseline ~1.2-1.3). GFR dropped to 29.  Prior note:  Pt presents for an OV with complaints of bilateral leg pain R> of 1 month duration.  Associated symptoms include cramping pain in her thighs, but can be her lower leg or foot at times. Mostly occur in the right leg and at night, but can occur in the day. She reports she had a a cramp that was so painful last week, that her muscle was sore the next day. She denies palpitations or shortness of breath.  She endorses a fall recently and feeling like she does not have great balance. She is concerned she will fall. She had completed PT a few years ago and willing to try again to help with balance.   Depression screen [facility] 2/9 02/04/2021 07/22/2020 06/25/2020 04/24/2020 10/10/2019  Decreased  Interest 0 0 0 1 0  Down, Depressed, Hopeless 1 0 0 1 0  PHQ - 2 Score 1 0 0 2 0  Altered sleeping 1 - - 1 0  Tired, decreased energy 1 - - 3 1  Change in appetite 0 - - 0 0  Feeling bad or failure about yourself  0 - - 0 0  Trouble concentrating 1 - - 1 0  Moving slowly or fidgety/restless 0 - - 0 0  Suicidal thoughts 0 - - 0 0  PHQ-9 Score 4 - - 7 1  Difficult doing work/chores Somewhat difficult - - Somewhat difficult Not difficult at all  Some recent data might be hidden    Allergies  Allergen Reactions  . Meloxicam Swelling    Caused edema and Cr increase  . Morphine Nausea And Vomiting    SEVERE  . Other Other (See Comments)  . Sulfa Antibiotics Swelling   Social History   Social History Narrative   She is retired and divorced and has 1 child   Occasional alcohol, former smoker no drug use   Right handed    Caffeine 2-3 cups per day   Lives at alone    Past Medical History:  Diagnosis Date  . Abnormal mammogram 08/17/2017   08/17/2017--> will need [place]  . Anemia due to other cause 04/16/2014   Family Hx of beta thalassemia & mother had  myelodysplasia, MGF leukemia. Historical treatment: B12 inj. B12 levels are sustained on oral B complex.  Dropped twice in last yr-lowest 10.8. Improved from 10.8 to 11.8 after starting daily iron supplement.   She is c/o episodes of intense abd pain that lasts 2-3 days. She is taking bentyl with relief after day 2 or 3.     . Anxiety   . Arthritis    oa  . Atypical lobular hyperplasia (ALH) of left breast 12/06/2019  . Cardiac murmur 08/06/2020  . Chronic cystitis   . CKD (chronic kidney disease) stage 3, GFR 30-59 ml/min (HCC) 04/13/2017  . 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 DDD (degenerative disc disease), cervical   . Depression   . Depression with anxiety 11/04/2007   Current med: xanax PRN, zoloft 100 mg qd.    . Dizzy spells 08/06/2020  . Dysphagia 07/28/2018  . Essential hypertension 11/04/2007   Current  meds: lisinopril/HCTZ 10/12.5 mg qd; tenormin 25 mg qd    . Fibromyalgia   . Gait instability 01/22/2019  . GERD (gastroesophageal reflux disease)   . Headache    sinus  . Heart murmur   . Hemorrhoids, internal, with bleeding 06/26/2014   Gr 2 all 3 positions    . Hemorrhoids, internal, with bleeding 06/26/2014   Gr 2 all 3 positions    . History of duodenal ulcer    2007  . History of gastritis    2007  . Hoarseness 08/22/2015   GI- did not feel gerd, ENT did not feel ent, neuro- felt possibly ent--> sent for 2nd opinion ent.   Marland Kitchen HTN (hypertension)   . Hyperlipidemia   . Hypertension, essential   . Hypertriglyceridemia 07/31/2014  . Insomnia 07/25/2015  . Internal and external hemorrhoids without complication   . Internal and external hemorrhoids without complication   . Intrinsic (urethral) sphincter deficiency (ISD)   . Irritable bowel syndrome   . Long term prescription benzodiazepine use 10/24/2018  . Lumbar pain with radiation down right leg 10/24/2018  . Medicare annual wellness visit, subsequent 11/24/2015  . Morbid obesity ([place]) 09/11/2015  . 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    . Osteopenia   . Osteoporosis 02/06/2014   The BMD measured at [facility] L2-L3 is 0.878 g/cm2 with a T-score of -2.7. This patient is considered osteoporotic according to [facility]) criteria. L-1, L-4 were excluded due to degenerative changes. Per the official positions of the ISCD, it is not possible to quantitatively compare BMD or calculate an [facility] between exams done at different facilities.   . Pain management contract agreement 10/24/2018  . Palpitations 04/20/2018  . Panic disorder 10/14/2016  . Pernicious anemia   . Pneumonia   . PONV (postoperative nausea and vomiting)   . PONV (postoperative nausea and vomiting)   . Primary osteoarthritis of foot 10/24/2018  . Primary osteoarthritis of left knee 03/19/2015   Now with murphy wainer. MRI scheduled.  04/16/2016   . Seasonal allergies 02/19/2016  . Unspecified essential hypertension   . Unspecified venous (peripheral) insufficiency    wears compression hose  . Unspecified vitamin D deficiency   . Urinary incontinence 01/30/2014   Followed by Dr Gaynelle Arabian: Reviewed note 03/05/2015: mixed incontinence. Failed myrbetriq. stress incontinence, recurrent cystocele,f/u 6 mos for pelvic exam  Status post anterior vault suspension w/kelly plication & sacrospinous ligament fixation w/xenoform bovine dermis graft augmentation .     Marland Kitchen Urine incontinence   .  Vitamin D deficiency 07/20/2008   Taking 1000 iu qd    . Vitamin D deficiency, unspecified    Past Surgical History:  Procedure Laterality Date  . ANTERIOR AND POSTERIOR VAGINAL REPAIR  04-29-2004   AND TRANSVAGINAL TAPE SLING  . ANTERIOR CERVICAL DECOMP/DISCECTOMY FUSION  1999  . APPENDECTOMY  1984  . BILATERAL SALPINGOOPHORECTOMY  1984  . Bladder stimulator    . BREAST LUMPECTOMY WITH RADIOACTIVE SEED LOCALIZATION Left 10/31/2019   Procedure: LEFT BREAST LUMPECTOMY WITH RADIOACTIVE SEED LOCALIZATION;  Surgeon: Erroll Luna, MD;  Location: [facility];  Service: General;  Laterality: Left;  . CARDIAC CATHETERIZATION  10-03-2002   DR DEGENT   NORMAL CORONARIES ARTERIES/  EF 55%  . CYSTOSCOPY N/A 08/27/2013   Procedure: CYSTOSCOPY;  Surgeon: Ailene Rud, MD;  Location[facility];  Service: Urology;  Laterality: N/A;  . CYSTOSCOPY WITH INJECTION N/A 12/17/2013   Procedure: Ascencion Dike WITH INJECTION;  Surgeon: Ailene Rud, MD;  Location: [facility];  Service: Urology;  Laterality: N/A;  . DILATION AND CURETTAGE OF UTERUS    . EXCISION RIGHT NECK AND LEFT BREAST SEBACEOUS CYST  05-18-2002  . HEMORRHOID BANDING  2014  . PUBOVAGINAL SLING N/A 10/25/2016   Procedure: Gaynelle Arabian;  Surgeon: Carolan Clines, MD;  Location: [facility];  Service: Urology;  Laterality: N/A;  .  right litlle finger surgery  yrs ago   cyst removed x 4  . TOTAL ABDOMINAL HYSTERECTOMY  1986  . TUBAL LIGATION    . VAGINAL PROLAPSE REPAIR N/A 08/27/2013   Procedure: ANTERIOR VAGINAL VAULT SUSPENSION, KELLY PLICATION WITH SACROSPINOUS LIGAMENT FIXATION AND XENFORM BOVINE DERMIS GRAFT AUGMENTATION, URETHRAL EXPLORATION, URETHROLYSIS, EXPLANTATION OF TVT TAPE, IMPLANTATION OF FLOSEAL, IMPLANTATION OF XENFORM BOVINE GRAFT IN PERIURETHRAL SPACE;  Surgeon: Ailene Rud, MD;  Location: [facility];  Service: Urology;  Laterality: N/A  . VAGINAL PROLAPSE REPAIR N/A 10/25/2016   Procedure: VAGINAL VAULT SUSPENSION with mesh and sacrospinous repair;  Surgeon: Carolan Clines, MD;  Location: [facility];  Service: Urology;  Laterality: N/A;   Family History  Problem Relation Age of Onset  . Lung cancer Father   . Coronary artery disease Father   . Hypertension Mother   . Heart disease Mother   . Myelodysplastic syndrome Mother   . Arthritis Mother   . Cervical cancer Mother   . Dementia Maternal Aunt   . Fibromyalgia Sister   . Hypertension Sister   . Anemia Sister   . Depression Sister   . Hypertension Brother   . Anemia Brother   . Hypertension Brother   . Hyperlipidemia Brother   . Colon polyps Other        aunt  . Heart disease Maternal Uncle   . Lung cancer Paternal Uncle   . Kidney disease Sister        kidney stones, removed kidney  . Dementia Maternal Aunt   . Lung cancer Maternal Uncle   . Other Paternal Uncle        brain Tumor  . Pancreatic cancer Cousin   . Colon cancer Neg Hx    Allergies as of 02/11/2021      Reactions   Meloxicam Swelling   Caused edema and Cr increase   Morphine Nausea And Vomiting   SEVERE   Other Other (See Comments)   Sulfa Antibiotics Swelling      Medication List       Accurate as of Feb 11, 2021  5:42 PM. If you have any questions, ask your nurse or doctor.        STOP taking these medications   diclofenac  Sodium 1 % Gel Commonly known as: VOLTAREN Stopped by: Howard Pouch, DO     TAKE these medications   atenolol 25 MG tablet Commonly known as: TENORMIN Take 0.5 tablets (12.5 mg total) by mouth daily.   calcium carbonate 600 MG Tabs tablet Commonly known as: OS-CAL Take 600 mg by mouth daily.   cetirizine 10 MG tablet Commonly known as: ZYRTEC Take 10 mg by mouth daily as needed for allergies.   cholecalciferol 25 MCG (1000 UNIT) tablet Commonly known as: VITAMIN D3 Take 1,000 Units by mouth daily.   clotrimazole 1 % cream Commonly known as: LOTRIMIN   dicyclomine 10 MG capsule Commonly known as: BENTYL TAKE 1 CAPSULE FOUR TIMES DAILY BEFORE MEALS AND AT BEDTIME   DULoxetine 60 MG capsule Commonly known as: CYMBALTA TAKE 1 CAPSULE TWICE DAILY (NEED MD APPOINTMENT)   escitalopram 20 MG tablet Commonly known as: LEXAPRO Take 1 tablet (20 mg total) by mouth daily.   fesoterodine 4 MG Tb24 tablet Commonly known as: TOVIAZ Take 4 mg by mouth daily.   FISH OIL PO Take 1 capsule by mouth daily.   fluticasone 50 MCG/ACT nasal spray Commonly known as: FLONASE Place into both nostrils as needed.   LORazepam 0.5 MG tablet Commonly known as: ATIVAN Take 1 tablet (0.5 mg total) by mouth every 12 (twelve) hours as needed for anxiety (for panic attack).   losartan-hydrochlorothiazide 50-12.5 MG tablet Commonly known as: HYZAAR Take 0.5 tablets by mouth daily.   omeprazole 40 MG capsule Commonly known as: PRILOSEC Take 1 capsule (40 mg total) by mouth 2 (two) times daily before a meal. 30 mins before breakfast and supper   pravastatin 40 MG tablet Commonly known as: PRAVACHOL Take 1 tablet (40 mg total) by mouth daily.   RECLAST IV Inject into the vein.   Restasis 0.05 % ophthalmic emulsion Generic drug: cycloSPORINE   tamoxifen 20 MG tablet Commonly known as: NOLVADEX Take 20 mg by mouth daily.   traMADol 50 MG tablet Commonly known as: ULTRAM Take 1 tablet  (50 mg total) by mouth 2 (two) times daily as needed for moderate pain. TAKES 1 AT BEDTIME AND 1 DURING THE DAY ONLY IF NEEDED   traZODone 100 MG tablet Commonly known as: DESYREL Take 1 tablet (100 mg total) by mouth at bedtime.   Turmeric Curcumin 500 MG Caps Take 1,000 mg by mouth daily.   Vitamin B-12 1000 MCG Subl Place 1 tablet under the tongue daily.   WOMENS MULTIVITAMIN PLUS PO Take 1 tablet by mouth daily.       All past medical history, surgical history, allergies, family history, immunizations andmedications were updated in the EMR today and reviewed under the history and medication portions of their EMR.     ROS: Negative, with the exception of above mentioned in HPI   Objective:  BP 108/71   Pulse 74   Temp 98 F (36.7 C) (Oral)   Ht 5\' 5"  (1.651 m)   Wt 199 lb (90.3 kg)   SpO2 99%   BMI 33.12 kg/m  Body mass index is 33.12 kg/m. Gen: Afebrile. No acute distress.  HENT: AT. [place].  Eyes:Pupils Equal Round Reactive to light, Extraocular movements intact,  Conjunctiva without redness, discharge or icterus. CV: RRR  Chest: CTAB Neuro:  Normal gait. PERLA. EOMi. Alert. Oriented x3  Psych: Normal affect, dress and demeanor. Normal speech. Normal thought content and judgment.  No exam data present No results found. Results for orders placed or performed in visit on 02/11/21 (from the past 24 hour(s))  Basic metabolic panel     Status: Abnormal   Collection Time: 02/11/21 11:24 AM  Result Value Ref Range   Sodium 142 135 - 145 mEq/L   Potassium 4.1 3.5 - 5.1 mEq/L   Chloride 105 96 - 112 mEq/L   CO2 30 19 - 32 mEq/L   Glucose, Bld 84 70 - 99 mg/dL   BUN 25 (H) 6 - 23 mg/dL   Creatinine, Ser 1.42 (H) 0.40 - 1.20 mg/dL   GFR 36.59 (L) >60.00 mL/min   Calcium 9.0 8.4 - 10.5 mg/dL  Uric acid     Status: None   Collection Time: 02/11/21 11:24 AM  Result Value Ref Range   Uric Acid, Serum 3.5 2.4 - 7.0 mg/dL    Assessment/Plan: KEMBA HOPPES is a 75 y.o.  female present for OV for  Muscle cramps/AKI on CKD3 Muscle cramps improved with hydration (propel) Continue to HYDRATE! Recheck bmp today.  - Iron, TIBC and Ferritin Panel> normal.  - PTH, Intact and Calcium> normal.  - TSH> normal.   Stage 3 chronic kidney disease, unspecified whether stage 3a or 3b CKD (HCC) Renally dose medications and monitor pth/ca/vit d Avoid nephrotoxins.  HYDRATE bmp   Reviewed expectations re: course of current medical issues.  Discussed self-management of symptoms.  Outlined signs and symptoms indicating need for more acute intervention.  Patient verbalized understanding and all questions were answered.  Patient received an After-Visit Summary.    Orders Placed This Encounter  Procedures  . Basic metabolic panel  . Uric acid   No orders of the defined types were placed in this encounter.  Referral Orders  No referral(s) requested today     Note is dictated utilizing voice recognition software. Although note has been proof read prior to signing, occasional typographical errors still can be missed. If any questions arise, please do not hesitate to call for verification.   electronically signed by:  Howard Pouch, DO  [place]

## 2021-02-11 NOTE — Patient Instructions (Signed)
Dehydration, Elderly  Dehydration is condition in which there is not enough water or other fluids in the body. This happens when a person loses more fluids than he or she takes in. Important body parts cannot work right without the right amount of fluids. Any loss of fluids from the body can cause dehydration. People 74 years of age or older have a higher risk of dehydration than younger adults. This is because in older age, the body:  Is less able to keep the right amount of water.  Does not respond to temperature changes as well.  Does not get a sense of thirst as easily or quickly. Dehydration can be mild, worse, or very bad. It should be treated right away to keep it from getting very bad. What are the causes? This condition may be caused by:  Conditions that cause loss of water or other fluids, such as: ? Watery poop (diarrhea). ? Vomiting. ? Sweating a lot. ? Peeing (urinating) a lot.  Not drinking enough fluids, especially when you: ? Are ill. ? Are doing things that take a lot of energy to do.  Other illnesses and conditions, such as fever or infection.  Certain medicines, such as medicines that take extra fluid out of the body (diuretics).  Lack of safe drinking water.  Not being able to get enough water and food. What increases the risk? The following factors may make you more likely to develop this condition:  Having a long-term (chronic) illness that has not been treated the right way, such as: ? An illness that may cause you to pee more, such as diabetes. ? Kidney, heart, or lung disease. ? A condition such as dementia. This affects:  The brain and nervous system.  Thinking.  Feelings.  Being 73 years of age or older.  Having a disability.  Living in a place that is high above the ground or sea (high in altitude). The thinner, drier air causes more fluid loss. What are the signs or symptoms? Symptoms of dehydration depend on how bad it is. Mild or worse  dehydration  Thirst.  Dry lips or dry mouth.  Feeling dizzy or light-headed, especially when you stand up from sitting.  Muscle cramps.  Your body making: ? Dark pee (urine). Pee may be the color of tea. ? Less pee than normal. ? Less tears than normal.  Headache. Very bad dehydration  Changes in skin. Skin may: ? Be cold to the touch (clammy). ? Be blotchy or pale. ? Not go back to normal right after you lightly pinch it and let it go.  Little or no tears, pee, or sweat.  Changes in vital signs, such as: ? Fast breathing. ? Low blood pressure. ? Weak pulse. ? Pulse that is more than 100 beats a minute when you are sitting still.  Other changes, such as: ? Feeling very thirsty. ? Eyes that look hollow (sunken). ? Cold hands and feet. ? Being mixed up (confused). ? Being very tired (lethargic) or having trouble waking from sleep. ? Short-term weight loss. ? Loss of consciousness. How is this treated? Treatment for this condition depends on how bad it is. Treatment should start right away. Do not wait until your condition gets very bad. Very bad dehydration is an emergency. You will need to go to a hospital.  Mild or worse dehydration can be treated at home. You may be asked to: ? Drink more fluids. ? Drink an oral rehydration solution (ORS). This drink helps  get the right amounts of fluids and salts and minerals in the blood (electrolytes).  Very bad dehydration can be treated: ? With fluids through an IV tube. ? By getting normal levels of salts and minerals in your blood. This is often done by giving salts and minerals through a tube. The tube is passed through your nose and into your stomach. ? By treating the root cause. Follow these instructions at home: Oral rehydration solution If told by your doctor, drink an ORS:  Make an ORS. Use instructions on the package.  Start by drinking small amounts, about  cup (120 mL) every 5-10 minutes.  Slowly drink more  until you have had the amount that your doctor said to have. Eating and drinking  Drink enough clear fluid to keep your pee pale yellow. If you were told to drink an ORS, finish the ORS first. Then, start slowly drinking other clear fluids. Drink fluids such as: ? Water. Do not drink only water. Doing that can make the salt (sodium) level in your body get too low. ? Water from ice chips you suck on. ? Fruit juice that you have added water to (diluted). ? Low-calorie sports drinks.  Eat foods that have the right amounts of salts and minerals, such as: ? Bananas. ? Oranges. ? Potatoes. ? Tomatoes. ? Spinach.  Do not drink alcohol.  Avoid: ? Drinks that have a lot of sugar. These include:  High-calorie sports drinks.  Fruit juice that you did not add water to.  Soda.  Caffeine. ? Foods that are greasy or have a lot of fat or sugar.         General instructions  Take over-the-counter and prescription medicines only as told by your doctor.  Do not take salt tablets. Doing that can make the salt level in your body get too high.  Return to your normal activities as told by your doctor. Ask your doctor what activities are safe for you.  Keep all follow-up visits as told by your doctor. This is important. Contact a doctor if:  You have pain in your belly (abdomen) and the pain: ? Gets worse. ? Stays in one place.  You have a rash.  You have a stiff neck.  You get angry or annoyed (irritable) more easily than normal.  You are more tired or have a harder time waking than normal.  You feel: ? Weak or dizzy. ? Very thirsty. Get help right away if you have:  Any symptoms of very bad dehydration.  A fever.  A very bad headache.  Symptoms of vomiting, such as: ? Your vomiting gets worse or does not go away. ? Your vomit has blood or green stuff in it. ? You cannot eat or drink without vomiting.  Problems with peeing or pooping (having a bowel movement), such  as: ? Watery poop that gets worse or does not go away. ? Blood in your poop (stool). This may cause poop to look black and tarry. ? Not peeing in 6-8 hours. ? Peeing only a small amount of very dark pee in 6-8 hours.  Trouble breathing.  Symptoms that get worse with treatment. These symptoms may be an emergency. Do not wait to see if the symptoms will go away. Get medical help right away. Call your local emergency services (911 in the [place] Do not drive yourself to the hospital. Summary  Dehydration is a condition in which there is not enough water or other fluids in the body.  This happens when a person loses more fluids than he or she takes in.  Treatment for this condition depends on how bad it is. Treatment should be started right away. Do not wait until your condition gets very bad.  Drink enough clear fluid to keep your pee pale yellow. If you were told to drink an oral rehydration solution (ORS), finish the ORS first. Then, start slowly drinking other clear fluids.  Take over-the-counter and prescription medicines only as told by your doctor.  Get help right away if you have any symptoms of very bad dehydration. This information is not intended to replace advice given to you by your health care provider. Make sure you discuss any questions you have with your health care provider. Document Revised: 05/03/2019 Document Reviewed: 05/03/2019 Elsevier Patient Education  [place].

## 2021-02-12 DIAGNOSIS — R296 Repeated falls: Secondary | ICD-10-CM | POA: Diagnosis not present

## 2021-02-12 DIAGNOSIS — R2689 Other abnormalities of gait and mobility: Secondary | ICD-10-CM | POA: Diagnosis not present

## 2021-02-18 DIAGNOSIS — R2689 Other abnormalities of gait and mobility: Secondary | ICD-10-CM | POA: Diagnosis not present

## 2021-02-18 DIAGNOSIS — R296 Repeated falls: Secondary | ICD-10-CM | POA: Diagnosis not present

## 2021-02-20 DIAGNOSIS — R296 Repeated falls: Secondary | ICD-10-CM | POA: Diagnosis not present

## 2021-02-20 DIAGNOSIS — R2689 Other abnormalities of gait and mobility: Secondary | ICD-10-CM | POA: Diagnosis not present

## 2021-02-24 ENCOUNTER — Other Ambulatory Visit: Payer: Medicare HMO

## 2021-02-26 DIAGNOSIS — R296 Repeated falls: Secondary | ICD-10-CM | POA: Diagnosis not present

## 2021-02-26 DIAGNOSIS — R2689 Other abnormalities of gait and mobility: Secondary | ICD-10-CM | POA: Diagnosis not present

## 2021-03-03 DIAGNOSIS — R2689 Other abnormalities of gait and mobility: Secondary | ICD-10-CM | POA: Diagnosis not present

## 2021-03-03 DIAGNOSIS — R296 Repeated falls: Secondary | ICD-10-CM | POA: Diagnosis not present

## 2021-03-04 HISTORY — PX: BREAST BIOPSY: SHX20

## 2021-03-05 DIAGNOSIS — R296 Repeated falls: Secondary | ICD-10-CM | POA: Diagnosis not present

## 2021-03-05 DIAGNOSIS — R2689 Other abnormalities of gait and mobility: Secondary | ICD-10-CM | POA: Diagnosis not present

## 2021-03-11 DIAGNOSIS — R296 Repeated falls: Secondary | ICD-10-CM | POA: Diagnosis not present

## 2021-03-11 DIAGNOSIS — R2689 Other abnormalities of gait and mobility: Secondary | ICD-10-CM | POA: Diagnosis not present

## 2021-03-16 DIAGNOSIS — R2689 Other abnormalities of gait and mobility: Secondary | ICD-10-CM | POA: Diagnosis not present

## 2021-03-16 DIAGNOSIS — R296 Repeated falls: Secondary | ICD-10-CM | POA: Diagnosis not present

## 2021-03-18 DIAGNOSIS — R2689 Other abnormalities of gait and mobility: Secondary | ICD-10-CM | POA: Diagnosis not present

## 2021-03-18 DIAGNOSIS — R296 Repeated falls: Secondary | ICD-10-CM | POA: Diagnosis not present

## 2021-03-19 ENCOUNTER — Other Ambulatory Visit: Payer: Self-pay | Admitting: Family Medicine

## 2021-03-19 ENCOUNTER — Ambulatory Visit: Admission: RE | Admit: 2021-03-19 | Payer: Medicare HMO | Source: Ambulatory Visit

## 2021-03-19 ENCOUNTER — Ambulatory Visit
Admission: RE | Admit: 2021-03-19 | Discharge: 2021-03-19 | Disposition: A | Discharge status: Home / Self Care | Payer: Medicare HMO | Source: Ambulatory Visit | Attending: Family Medicine | Admitting: Family Medicine

## 2021-03-19 ENCOUNTER — Other Ambulatory Visit: Payer: Self-pay

## 2021-03-19 DIAGNOSIS — R921 Mammographic calcification found on diagnostic imaging of breast: Secondary | ICD-10-CM | POA: Diagnosis not present

## 2021-03-19 DIAGNOSIS — R928 Other abnormal and inconclusive findings on diagnostic imaging of breast: Secondary | ICD-10-CM

## 2021-03-26 ENCOUNTER — Ambulatory Visit
Admission: RE | Admit: 2021-03-26 | Discharge: 2021-03-26 | Disposition: A | Discharge status: Home / Self Care | Payer: Medicare HMO | Source: Ambulatory Visit | Attending: Family Medicine | Admitting: Family Medicine

## 2021-03-26 ENCOUNTER — Other Ambulatory Visit: Payer: Self-pay

## 2021-03-26 DIAGNOSIS — R921 Mammographic calcification found on diagnostic imaging of breast: Secondary | ICD-10-CM

## 2021-03-26 DIAGNOSIS — Z6841 Body Mass Index (BMI) 40.0 and over, adult: Secondary | ICD-10-CM

## 2021-03-26 DIAGNOSIS — N6011 Diffuse cystic mastopathy of right breast: Secondary | ICD-10-CM | POA: Diagnosis not present

## 2021-03-26 DIAGNOSIS — M1991 Primary osteoarthritis, unspecified site: Secondary | ICD-10-CM | POA: Insufficient documentation

## 2021-03-26 DIAGNOSIS — M2550 Pain in unspecified joint: Secondary | ICD-10-CM

## 2021-03-26 HISTORY — DX: Body Mass Index (BMI) 40.0 and over, adult: Z684

## 2021-03-26 HISTORY — DX: Pain in unspecified joint: M25.50

## 2021-03-26 HISTORY — DX: Primary osteoarthritis, unspecified site: M19.91

## 2021-03-29 ENCOUNTER — Other Ambulatory Visit: Payer: Self-pay | Admitting: Family Medicine

## 2021-03-30 ENCOUNTER — Encounter: Payer: Self-pay | Admitting: Cardiology

## 2021-03-30 ENCOUNTER — Ambulatory Visit: Payer: Medicare HMO | Admitting: Cardiology

## 2021-03-30 ENCOUNTER — Other Ambulatory Visit: Payer: Self-pay

## 2021-03-30 VITALS — BP 148/86 | HR 90 | Ht 65.0 in | Wt 202.1 lb

## 2021-03-30 DIAGNOSIS — E041 Nontoxic single thyroid nodule: Secondary | ICD-10-CM | POA: Diagnosis not present

## 2021-03-30 DIAGNOSIS — I700 Atherosclerosis of aorta: Secondary | ICD-10-CM | POA: Insufficient documentation

## 2021-03-30 DIAGNOSIS — I10 Essential (primary) hypertension: Secondary | ICD-10-CM

## 2021-03-30 DIAGNOSIS — E782 Mixed hyperlipidemia: Secondary | ICD-10-CM

## 2021-03-30 NOTE — Progress Notes (Signed)
Cardiology Office Note:    Date:  03/30/2021   ID:  [name] [name], DOB Feb 13, 1946, MRN [id number]  PCP:  Ma Hillock, DO  Cardiologist:  Jenean Lindau, MD   Referring MD: Ma Hillock, DO    ASSESSMENT:    1. Essential hypertension   2. Aortic atherosclerosis ([place])   3. Mixed hyperlipidemia    PLAN:    In order of problems listed above:  Aortic atherosclerosis: Secondary prevention stressed with the patient.  Importance of compliance with diet medication stressed and she vocalized understanding.  She was advised to walk at least half an hour a day 5 days a week and she promises to do so. Mixed dyslipidemia: Diet was emphasized.  Weight reduction stressed exercise stressed. Obesity: Weight reduction stressed.  Risks of obesity explained.  She promises to do better.  She is fasting and will have complete blood work today including fasting lipids Patient will be seen in follow-up appointment in 6 months or earlier if the patient has any concerns   Medication Adjustments/Labs and Tests Ordered: Current medicines are reviewed at length with the patient today.  Concerns regarding medicines are outlined above.  No orders of the defined types were placed in this encounter.  No orders of the defined types were placed in this encounter.    No chief complaint on file.    History of Present Illness:    [name] [name] is a 75 y.o. female.  Patient has past medical history of aortic atherosclerosis, mixed dyslipidemia and obesity.  She leads a sedentary lifestyle.  She has been lax with her diet.  She denies any chest pain orthopnea or PND.  At the time of my evaluation, the patient is alert awake oriented and in no distress.  Past Medical History:  Diagnosis Date   Anxiety    Arthritis    oa   At moderate risk for fall 02/04/2021   Atypical lobular hyperplasia [facility]) of left breast 12/06/2019   Body mass index (BMI) 45.0-49.9, adult ([place]) 03/26/2021   Cardiac murmur  08/06/2020   Chronic cystitis    CKD (chronic kidney disease) stage 3, GFR 30-59 ml/min (HCC) 04/13/2017   Cystic thyroid nodule 08/09/2018   7x10 mm, right lobe   DDD (degenerative disc disease), cervical    Depression with anxiety 11/04/2007   Current med: xanax PRN, zoloft 100 mg qd.     Dysphagia 07/28/2018   Essential hypertension 11/04/2007   Current meds: lisinopril/HCTZ 10/12.5 mg qd; tenormin 25 mg qd     Fibromyalgia    Gait instability 01/22/2019   GERD (gastroesophageal reflux disease)    Heart murmur    History of duodenal ulcer    2007   History of gastritis    2007   Hoarseness 08/22/2015   GI- did not feel gerd, ENT did not feel ent, neuro- felt possibly ent--> sent for 2nd opinion ent.    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 This patient is considered osteoporotic according to [facility]) criteria. L-1, L-4 were excluded due to degenerative changes. Per the official positions of the ISCD, it is not possible to quantitatively compare BMD or calculate an [facility] between exams done at different facilities.    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 MRI scheduled. 04/16/2016    Seasonal allergies  02/19/2016   Unspecified venous (peripheral) insufficiency    wears compression hose   Urine incontinence    Vitamin D deficiency 07/20/2008   Taking 1000 iu qd     Vitamin D deficiency, unspecified     Past Surgical History:  Procedure Laterality Date   ANTERIOR AND POSTERIOR VAGINAL REPAIR  04-29-2004   AND TRANSVAGINAL TAPE SLING   ANTERIOR CERVICAL DECOMP/DISCECTOMY FUSION  1999   APPENDECTOMY  1984   BILATERAL SALPINGOOPHORECTOMY  1984   Bladder stimulator     BREAST LUMPECTOMY WITH RADIOACTIVE SEED LOCALIZATION Left 10/31/2019   Procedure: LEFT BREAST LUMPECTOMY WITH RADIOACTIVE SEED LOCALIZATION;  Surgeon: Erroll Luna, MD;  Location: [facility];  Service: General;  Laterality: Left;   CARDIAC CATHETERIZATION  10-03-2002   DR Dannielle Burn   NORMAL CORONARIES ARTERIES/  EF 55%   CYSTOSCOPY N/A 08/27/2013   Procedure: CYSTOSCOPY;  Surgeon: Ailene Rud, MD;  Location[facility];  Service: Urology;  Laterality: N/A;   CYSTOSCOPY WITH INJECTION N/A 12/17/2013   Procedure: Ascencion Dike WITH INJECTION;  Surgeon: Ailene Rud, MD;  Location: [facility];  Service: Urology;  Laterality: N/A;   DILATION AND CURETTAGE OF UTERUS     EXCISION RIGHT NECK AND LEFT BREAST SEBACEOUS CYST  05-18-2002   HEMORRHOID BANDING  2014   PUBOVAGINAL SLING N/A 10/25/2016   Procedure: Gaynelle Arabian;  Surgeon: Carolan Clines, MD;  Location: [facility];  Service: Urology;  Laterality: N/A;   right litlle finger surgery  yrs ago   cyst removed x 4   TOTAL ABDOMINAL HYSTERECTOMY  1986   TUBAL LIGATION     VAGINAL PROLAPSE REPAIR N/A 08/27/2013   Procedure: ANTERIOR VAGINAL VAULT SUSPENSION, KELLY PLICATION WITH SACROSPINOUS LIGAMENT FIXATION AND XENFORM BOVINE DERMIS GRAFT AUGMENTATION, URETHRAL EXPLORATION, URETHROLYSIS, EXPLANTATION OF TVT TAPE, IMPLANTATION OF FLOSEAL, IMPLANTATION OF XENFORM BOVINE GRAFT IN PERIURETHRAL SPACE;  Surgeon:  Ailene Rud, MD;  Location: [place];  Service: Urology;  Laterality: N/A   VAGINAL PROLAPSE REPAIR N/A 10/25/2016   Procedure: VAGINAL VAULT SUSPENSION with mesh and sacrospinous repair;  Surgeon: Carolan Clines, MD;  Location: [facility];  Service: Urology;  Laterality: N/A;    Current Medications: Current Meds  Medication Sig   atenolol (TENORMIN) 25 MG tablet Take 0.5 tablets (12.5 mg total) by mouth daily.   calcium carbonate (OS-CAL) 600 MG TABS Take 600 mg by mouth daily.   cetirizine (ZYRTEC) 10 MG tablet Take 10 mg by mouth daily as needed for allergies.   cholecalciferol (VITAMIN D3) 25 MCG (1000 UNIT) tablet Take 1,000 Units by mouth daily.   Cyanocobalamin (VITAMIN B-12) 1000 MCG SUBL Place 1 tablet under the tongue daily.    dicyclomine (BENTYL) 10 MG capsule Take 10 mg by mouth 2 (two) times daily.   DULoxetine (CYMBALTA) 60 MG capsule Take 60  mg by mouth 2 (two) times daily.   escitalopram (LEXAPRO) 20 MG tablet Take 1 tablet (20 mg total) by mouth daily.   fesoterodine (TOVIAZ) 4 MG TB24 tablet Take 4 mg by mouth daily.   fluticasone (FLONASE) 50 MCG/ACT nasal spray Place 1 spray into both nostrils as needed for allergies.   LORazepam (ATIVAN) 0.5 MG tablet Take 1 tablet (0.5 mg total) by mouth every 12 (twelve) hours as needed for anxiety (for panic attack).   losartan-hydrochlorothiazide (HYZAAR) 50-12.5 MG tablet Take 0.5 tablets by mouth daily.   Multiple Vitamins-Minerals (WOMENS MULTIVITAMIN PLUS PO) Take 1 tablet by mouth daily.   Omega-3 Fatty Acids (FISH OIL PO) Take 1 capsule by mouth daily.    omeprazole (PRILOSEC) 40 MG capsule Take 1 capsule (40 mg total) by mouth 2 (two) times daily before a meal. 30 mins before breakfast and supper   pravastatin (PRAVACHOL) 40 MG tablet Take 1 tablet (40 mg total) by mouth daily.   RESTASIS 0.05 % ophthalmic emulsion Place 1 drop into both eyes as needed for dry eyes.   tamoxifen (NOLVADEX) 20 MG  tablet Take 20 mg by mouth daily.    traMADol (ULTRAM) 50 MG tablet Take 50 mg by mouth daily.   traZODone (DESYREL) 100 MG tablet Take 1 tablet (100 mg total) by mouth at bedtime.   Turmeric Curcumin 500 MG CAPS Take 1,000 mg by mouth daily.      Allergies:   Meloxicam, Morphine, and Sulfa antibiotics   Social History   Socioeconomic History   Marital status: Divorced    Spouse name: Not on file   Number of children: 1   Years of education: Not on file   Highest education level: 12th grade  Occupational History   Occupation: Retired  Tobacco Use   Smoking status: Former    Packs/day: 1.00    Years: 15.00    Pack years: 15.00    Types: Cigarettes    Start date: 12/06/1993    Quit date: 08/09/2009    Years since quitting: 11.6   Smokeless tobacco: Never   Tobacco comments:    quit smoking 5 years ago  Vaping Use   Vaping Use: Never used  Substance and Sexual Activity   Alcohol use: Not Currently    Comment: ocassional   Drug use: No   Sexual activity: Not Currently    Partners: Male  Other Topics Concern   Not on file  Social History Narrative   She is retired and divorced and has 1 child   Occasional alcohol, former smoker no drug use   Right handed    Caffeine 2-3 cups per day   Lives at alone    Social Determinants of Health   Financial Resource Strain: Low Risk    Difficulty of Paying Living Expenses: Not very hard  Food Insecurity: Food Insecurity Present   Worried About Charity fundraiser in the Last Year: Sometimes true   Arboriculturist in the Last Year: Never true  Transportation Needs: No Transportation Needs   Lack of Transportation (Medical): No   Lack of Transportation (Non-Medical): No  Physical Activity: Inactive   Days of Exercise per Week: 0 days   Minutes of Exercise per Session: 0 min  Stress: No Stress Concern Present   Feeling of Stress : Not at all  Social Connections: Moderately Integrated   Frequency of Communication with Friends and  Family: More than three times a week   Frequency of  Social Gatherings with Friends and Family: Once a week   Attends Religious Services: 1 to 4 times per year   Active Member of [employer] or Organizations: Yes   Attends Archivist Meetings: 1 to 4 times per year   Marital Status: Divorced     Family History: The patient's family history includes Anemia in her brother and sister; Arthritis in her mother; Cervical cancer in her mother; Colon polyps in an other family member; Coronary artery disease in her father; Dementia in her maternal aunt and maternal aunt; Depression in her sister; Fibromyalgia in her sister; Heart disease in her maternal uncle and mother; Hyperlipidemia in her brother; Hypertension in her brother, brother, mother, and sister; Kidney disease in her sister; Lung cancer in her father, maternal uncle, and paternal uncle; Myelodysplastic syndrome in her mother; Other in her paternal uncle; Pancreatic cancer in her cousin. There is no history of Colon cancer.  ROS:   Please see the history of present illness.    All other systems reviewed and are negative.  EKGs/Labs/Other Studies Reviewed:    The following studies were reviewed today: I discussed my findings with the patient in extensive length.   Recent Labs: 11/05/2020: Hemoglobin 11.7; Platelets 250.0 02/02/2021: ALT 12; TSH 2.54 02/11/2021: BUN 25; Creatinine, Ser 1.42; Potassium 4.1; Sodium 142  Recent Lipid Panel    Component Value Date/Time   CHOL 176 11/05/2020 0927   CHOL 172 09/09/2020 0847   TRIG 147.0 11/05/2020 0927   HDL 53.70 11/05/2020 0927   HDL 51 09/09/2020 0847   CHOLHDL 3 11/05/2020 0927   VLDL 29.4 11/05/2020 0927   LDLCALC 93 11/05/2020 0927   LDLCALC 91 09/09/2020 0847    Physical Exam:    VS:  BP (!) 148/86   Pulse 90   Ht 5\' 5"  (1.651 m)   Wt 202 lb 1.9 oz (91.7 kg)   SpO2 97%   BMI 33.63 kg/m     Wt Readings from Last 3 Encounters:  03/30/21 202 lb 1.9 oz (91.7 kg)   02/11/21 199 lb (90.3 kg)  02/02/21 199 lb (90.3 kg)     GEN: Patient is in no acute distress HEENT: Normal NECK: No JVD; No carotid bruits LYMPHATICS: No lymphadenopathy CARDIAC: Hear sounds regular, 2/6 systolic murmur at the apex. RESPIRATORY:  Clear to auscultation without rales, wheezing or rhonchi  ABDOMEN: Soft, non-tender, non-distended MUSCULOSKELETAL:  No edema; No deformity  SKIN: Warm and dry NEUROLOGIC:  Alert and oriented x 3 PSYCHIATRIC:  Normal affect   Signed, Jenean Lindau, MD  03/30/2021 9:44 AM    [place]

## 2021-03-30 NOTE — Patient Instructions (Signed)
Medication Instructions:  No medication changes. *If you need a refill on your cardiac medications before your next appointment, please call your pharmacy*   Lab Work: Your physician recommends that you have labs done in the office today. Your test included  basic metabolic panel, complete blood count, TSH, liver function and lipids.  If you have labs (blood work) drawn today and your tests are completely normal, you will receive your results only by: . MyChart Message (if you have MyChart) OR . A paper copy in the mail If you have any lab test that is abnormal or we need to change your treatment, we will call you to review the results.   Testing/Procedures: None ordered   Follow-Up: At [facility], you and your health needs are our priority.  As part of our continuing mission to provide you with exceptional heart care, we have created designated Provider Care Teams.  These Care Teams include your primary Cardiologist (physician) and Advanced Practice Providers (APPs -  Physician Assistants and Nurse Practitioners) who all work together to provide you with the care you need, when you need it.  We recommend signing up for the patient portal called "MyChart".  Sign up information is provided on this After Visit Summary.  MyChart is used to connect with patients for Virtual Visits (Telemedicine).  Patients are able to view lab/test results, encounter notes, upcoming appointments, etc.  Non-urgent messages can be sent to your provider as well.   To learn more about what you can do with MyChart, go to [email or web address].    Your next appointment:   6 month(s)  The format for your next appointment:   In Person  Provider:   Rajan Revankar, MD   Other Instructions NA   

## 2021-04-01 DIAGNOSIS — R296 Repeated falls: Secondary | ICD-10-CM | POA: Diagnosis not present

## 2021-04-01 DIAGNOSIS — R2689 Other abnormalities of gait and mobility: Secondary | ICD-10-CM | POA: Diagnosis not present

## 2021-04-01 LAB — BASIC METABOLIC PANEL
BUN/Creatinine Ratio: 18 (ref 12–28)
BUN: 21 mg/dL (ref 8–27)
CO2: 27 mmol/L (ref 20–29)
Calcium: 9.5 mg/dL (ref 8.7–10.3)
Chloride: 103 mmol/L (ref 96–106)
Creatinine, Ser: 1.18 mg/dL — ABNORMAL HIGH (ref 0.57–1.00)
Glucose: 93 mg/dL (ref 65–99)
Potassium: 4 mmol/L (ref 3.5–5.2)
Sodium: 143 mmol/L (ref 134–144)
eGFR: 48 mL/min/{1.73_m2} — ABNORMAL LOW (ref >59)

## 2021-04-01 LAB — HEPATIC FUNCTION PANEL
ALT: 11 IU/L (ref 0–32)
AST: 12 IU/L (ref 0–40)
Albumin: 4.1 g/dL (ref 3.7–4.7)
Alkaline Phosphatase: 46 IU/L (ref 44–121)
Bilirubin Total: 0.3 mg/dL (ref 0.0–1.2)
Bilirubin, Direct: 0.11 mg/dL (ref 0.00–0.40)
Total Protein: 6.3 g/dL (ref 6.0–8.5)

## 2021-04-01 LAB — LIPID PANEL
Chol/HDL Ratio: 3.1 ratio (ref 0.0–4.4)
Cholesterol, Total: 165 mg/dL (ref 100–199)
HDL: 53 mg/dL (ref >39)
LDL Chol Calc (NIH): 88 mg/dL (ref 0–99)
Triglycerides: 135 mg/dL (ref 0–149)
VLDL Cholesterol Cal: 24 mg/dL (ref 5–40)

## 2021-04-01 LAB — TSH: TSH: 3.75 u[IU]/mL (ref 0.450–4.500)

## 2021-04-10 DIAGNOSIS — R296 Repeated falls: Secondary | ICD-10-CM | POA: Diagnosis not present

## 2021-04-10 DIAGNOSIS — R2689 Other abnormalities of gait and mobility: Secondary | ICD-10-CM | POA: Diagnosis not present

## 2021-04-13 DIAGNOSIS — R2689 Other abnormalities of gait and mobility: Secondary | ICD-10-CM | POA: Diagnosis not present

## 2021-04-13 DIAGNOSIS — R296 Repeated falls: Secondary | ICD-10-CM | POA: Diagnosis not present

## 2021-04-16 DIAGNOSIS — R296 Repeated falls: Secondary | ICD-10-CM | POA: Diagnosis not present

## 2021-04-16 DIAGNOSIS — R2689 Other abnormalities of gait and mobility: Secondary | ICD-10-CM | POA: Diagnosis not present

## 2021-04-21 ENCOUNTER — Ambulatory Visit: Payer: Medicare HMO | Admitting: Family Medicine

## 2021-04-22 ENCOUNTER — Ambulatory Visit (INDEPENDENT_AMBULATORY_CARE_PROVIDER_SITE_OTHER): Payer: Medicare HMO | Admitting: Family Medicine

## 2021-04-22 ENCOUNTER — Other Ambulatory Visit: Payer: Self-pay

## 2021-04-22 ENCOUNTER — Encounter: Payer: Self-pay | Admitting: Family Medicine

## 2021-04-22 VITALS — BP 118/70 | HR 69 | Temp 98.0°F | Ht 65.0 in | Wt 204.0 lb

## 2021-04-22 DIAGNOSIS — M79604 Pain in right leg: Secondary | ICD-10-CM

## 2021-04-22 DIAGNOSIS — E559 Vitamin D deficiency, unspecified: Secondary | ICD-10-CM | POA: Diagnosis not present

## 2021-04-22 DIAGNOSIS — M1712 Unilateral primary osteoarthritis, left knee: Secondary | ICD-10-CM | POA: Diagnosis not present

## 2021-04-22 DIAGNOSIS — G4700 Insomnia, unspecified: Secondary | ICD-10-CM | POA: Diagnosis not present

## 2021-04-22 DIAGNOSIS — E781 Pure hyperglyceridemia: Secondary | ICD-10-CM

## 2021-04-22 DIAGNOSIS — N6092 Unspecified benign mammary dysplasia of left breast: Secondary | ICD-10-CM

## 2021-04-22 DIAGNOSIS — I10 Essential (primary) hypertension: Secondary | ICD-10-CM | POA: Diagnosis not present

## 2021-04-22 DIAGNOSIS — K580 Irritable bowel syndrome with diarrhea: Secondary | ICD-10-CM

## 2021-04-22 DIAGNOSIS — F418 Other specified anxiety disorders: Secondary | ICD-10-CM | POA: Diagnosis not present

## 2021-04-22 DIAGNOSIS — Z6841 Body Mass Index (BMI) 40.0 and over, adult: Secondary | ICD-10-CM

## 2021-04-22 DIAGNOSIS — F410 Panic disorder [episodic paroxysmal anxiety] without agoraphobia: Secondary | ICD-10-CM

## 2021-04-22 DIAGNOSIS — I700 Atherosclerosis of aorta: Secondary | ICD-10-CM

## 2021-04-22 DIAGNOSIS — M810 Age-related osteoporosis without current pathological fracture: Secondary | ICD-10-CM

## 2021-04-22 DIAGNOSIS — M5030 Other cervical disc degeneration, unspecified cervical region: Secondary | ICD-10-CM

## 2021-04-22 DIAGNOSIS — M545 Low back pain, unspecified: Secondary | ICD-10-CM

## 2021-04-22 DIAGNOSIS — Z79899 Other long term (current) drug therapy: Secondary | ICD-10-CM

## 2021-04-22 DIAGNOSIS — M797 Fibromyalgia: Secondary | ICD-10-CM

## 2021-04-22 MED ORDER — ATENOLOL 25 MG PO TABS: 12.5000 mg | ORAL_TABLET | Freq: Every day | ORAL | 1 refills | Status: DC

## 2021-04-22 MED ORDER — DULOXETINE HCL 60 MG PO CPEP: 60.0000 mg | ORAL_CAPSULE | Freq: Two times a day (BID) | ORAL | 1 refills | Status: DC

## 2021-04-22 MED ORDER — OMEPRAZOLE 40 MG PO CPDR: 40.0000 mg | DELAYED_RELEASE_CAPSULE | Freq: Two times a day (BID) | ORAL | 3 refills | Status: DC

## 2021-04-22 MED ORDER — LOSARTAN POTASSIUM-HCTZ 50-12.5 MG PO TABS: 0.5000 | ORAL_TABLET | Freq: Every day | ORAL | 1 refills | Status: DC

## 2021-04-22 MED ORDER — DICYCLOMINE HCL 10 MG PO CAPS: 10.0000 mg | ORAL_CAPSULE | Freq: Two times a day (BID) | ORAL | 1 refills | Status: DC

## 2021-04-22 MED ORDER — ESCITALOPRAM OXALATE 20 MG PO TABS: 20.0000 mg | ORAL_TABLET | Freq: Every day | ORAL | 1 refills | Status: DC

## 2021-04-22 MED ORDER — TRAZODONE HCL 100 MG PO TABS: 100.0000 mg | ORAL_TABLET | Freq: Every day | ORAL | 1 refills | Status: DC

## 2021-04-22 MED ORDER — PRAVASTATIN SODIUM 40 MG PO TABS: 40.0000 mg | ORAL_TABLET | Freq: Every day | ORAL | 3 refills | Status: DC

## 2021-04-22 MED ORDER — TRAMADOL HCL 50 MG PO TABS: 50.0000 mg | ORAL_TABLET | Freq: Every day | ORAL | 1 refills | Status: DC

## 2021-04-22 MED ORDER — LORAZEPAM 0.5 MG PO TABS: 0.5000 mg | ORAL_TABLET | Freq: Two times a day (BID) | ORAL | 1 refills | Status: DC | PRN

## 2021-04-22 NOTE — Patient Instructions (Addendum)
Great to see you today.  I have refilled the medication(s) we provide.   If labs were collected, we will inform you of lab results once received either by echart message or telephone call.   - echart message- for normal results that have been seen by the patient already.   - telephone call: abnormal results or if patient has not viewed results in their echart.   I have refilled all your medications.  Next appt 5.5 months

## 2021-04-22 NOTE — Progress Notes (Signed)
Patient ID: [name] [name], female  DOB: 04-26-1946, 75 y.o.   MRN: [id number] Patient Care Team    Relationship Specialty Notifications Start End  Ma Hillock, DO PCP - General Family Medicine  06/02/15   Gatha Mayer, MD Consulting Physician Gastroenterology  11/24/15   Calvert Cantor, MD Consulting Physician Ophthalmology  11/24/15   Specialists, [facility]  Orthopedic Surgery  04/12/16   Pa, [facility]    01/16/18   Rheumatology, [facility]    01/16/18   Kathrynn Ducking, MD Consulting Physician Neurology  10/24/18     Chief Complaint  Patient presents with   Hypertension    [place]; pt is not fasting    Subjective: [name] [name] is a 75 y.o.  female present for [facility] All past medical history, surgical history, allergies, family history, immunizations, medications and social history were updated in the electronic medical record today. All recent labs, ED visits and hospitalizations within the last year were reviewed.  Hypertension/hyperlipidemia/CKD3:  Pt reports compliance  with Pravastatin 40 mg, losartan-HCTZ 50-12.5 mg daily mg and atenolol 12.5 mg. Patient denies chest pain, shortness of breath, dizziness or lower extremity edema.   Pt does not take daily baby ASA. Pt is  prescribed statin. Labs completed today Diet: Low-sodium Exercise: Attempts RF: Hypertension, hyperlipidemia. Family history of heart disease, former smoker, obesity   Anxiety/panic attack/insomnia: Patient reports compliance with lexapro 20 mg daily, Cymbalta twice a day, trazodone 100 mg nightly.. She had been on zoloft and tapered off when lexapro started. Cymbalta has been at maximum dose and likely chosen secondary to her lumbar/arthritis issues.   She does take  Ativan twice daily.She feels her mental health is doing well.     Arthritis/pain/lumbar pain with radiation down right leg: Patient reports compliance with tramadol 50 mg twice a day when necessary for arthritic  pain.She suffers from low back pain .  She has h/o anterior cervical decompression/discectomy in the past. Indication for chronic opioid: Moderate to severe arthritis and osteoporosis discomfort Medication and dose: Tramadol 50 mg twice daily as needed # pills per: #60 Last UDS date: UTD Pain contract signed (Y/N): Yes Date narcotic database last reviewed (include red flags): 04/22/21 She has been exercising, performing PT twice a week. She feels her balance is better.    Depression screen [facility] 2/9 04/22/2021 02/04/2021 07/22/2020 06/25/2020 04/24/2020  Decreased Interest 1 0 0 0 1  Down, Depressed, Hopeless 0 1 0 0 1  PHQ - 2 Score 1 1 0 0 2  Altered sleeping 1 1 - - 1  Tired, decreased energy 2 1 - - 3  Change in appetite 2 0 - - 0  Feeling bad or failure about yourself  0 0 - - 0  Trouble concentrating 0 1 - - 1  Moving slowly or fidgety/restless 0 0 - - 0  Suicidal thoughts 0 0 - - 0  PHQ-9 Score 6 4 - - 7  Difficult doing work/chores - Somewhat difficult - - Somewhat difficult  Some recent data might be hidden   GAD 7 : Generalized Anxiety Score 04/22/2021 10/10/2019 04/24/2019 01/23/2019  Nervous, Anxious, on Edge 0 1 2 1   Control/stop worrying 1 0 2 0  Worry too much - different things 3 0 2 3  Trouble relaxing 1 1 0 0  Restless 0 0 0 0  Easily annoyed or irritable 0 0 0 0  Afraid - awful might happen 1 1 0  1  Total GAD 7 Score 6 3 6 5   Anxiety Difficulty - Not difficult at all - Not difficult at all       Fall Risk  02/04/2021 07/22/2020 06/25/2020 10/10/2019 01/19/2019  Falls in the past year? 1 1 1 1  0  Number falls in past yr: 0 1 1 0 0  Injury with Fall? 0 0 0 0 0  Risk for fall due to : Impaired balance/gait - History of fall(s) Medication side effect History of fall(s);Impaired balance/gait  Follow up Falls evaluation completed Falls evaluation completed Falls prevention discussed Falls evaluation completed;Education provided;Falls prevention discussed Education provided;Falls  evaluation completed;Falls prevention discussed    Immunization History  Administered Date(s) Administered   Influenza Split 07/02/2011   Influenza Whole 07/16/2008, 07/04/2009, 07/14/2010   Influenza, High Dose Seasonal PF 07/04/2018, 06/20/2019   Influenza,inj,Quad PF,6+ Mos 06/29/2013, 07/08/2014, 06/23/2017   Influenza-Unspecified 07/02/2015, 06/22/2019, 08/02/2020   Moderna Sars-Covid-2 Vaccination 12/20/2019, 01/17/2020, 05/23/2020   Pneumococcal Conjugate-13 05/15/2014   Pneumococcal Polysaccharide-23 10/04/2006, 09/11/2015   Td 03/19/2010   Tdap 03/10/2020   Zoster Recombinat (Shingrix) 07/07/2019, 09/05/2019   Zoster, Live 07/25/2015    No results found.  Past Medical History:  Diagnosis Date   Anxiety    Arthritis    oa   At moderate risk for fall 02/04/2021   Atypical lobular hyperplasia [facility]) of left breast 12/06/2019   Body mass index (BMI) 45.0-49.9, adult ([place]) 03/26/2021   Cardiac murmur 08/06/2020   Chronic cystitis    CKD (chronic kidney disease) stage 3, GFR 30-59 ml/min (HCC) 04/13/2017   Cystic thyroid nodule 08/09/2018   7x10 mm, right lobe   DDD (degenerative disc disease), cervical    Depression with anxiety 11/04/2007   Current med: xanax PRN, zoloft 100 mg qd.     Dysphagia 07/28/2018   Essential hypertension 11/04/2007   Current meds: lisinopril/HCTZ 10/12.5 mg qd; tenormin 25 mg qd     Fibromyalgia    Gait instability 01/22/2019   GERD (gastroesophageal reflux disease)    Heart murmur    History of duodenal ulcer    2007   History of gastritis    2007   Hoarseness 08/22/2015   GI- did not feel gerd, ENT did not feel ent, neuro- felt possibly ent--> sent for 2nd opinion ent.    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 This patient is considered osteoporotic according to [facility]) criteria. L-1, L-4 were excluded due to degenerative changes. Per the official positions of the ISCD, it is not possible to quantitatively compare BMD or calculate an [facility] between exams done at different facilities.    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 MRI scheduled. 04/16/2016  Seasonal allergies 02/19/2016   Unspecified venous (peripheral) insufficiency    wears compression hose   Urine incontinence    Vitamin D deficiency 07/20/2008   Taking 1000 iu qd     Vitamin D deficiency, unspecified    Allergies  Allergen Reactions   Meloxicam Swelling    Caused edema and Cr increase   Morphine Nausea And Vomiting    SEVERE   Sulfa Antibiotics Swelling   Past Surgical History:  Procedure Laterality Date   ANTERIOR AND POSTERIOR VAGINAL REPAIR  04/29/2004   AND TRANSVAGINAL TAPE SLING   ANTERIOR CERVICAL DECOMP/DISCECTOMY FUSION  1999   APPENDECTOMY  1984   BILATERAL SALPINGOOPHORECTOMY  1984   Bladder stimulator     BREAST BIOPSY Right 03/2021   BREAST LUMPECTOMY WITH RADIOACTIVE SEED LOCALIZATION Left 10/31/2019   Procedure: LEFT BREAST LUMPECTOMY WITH RADIOACTIVE SEED LOCALIZATION;  Surgeon: Erroll Luna, MD;  Location: [place];  Service: General;  Laterality: Left;   CARDIAC CATHETERIZATION  10-03-2002   DR Dannielle Burn   NORMAL CORONARIES ARTERIES/  EF  55%   CYSTOSCOPY N/A 08/27/2013   Procedure: CYSTOSCOPY;  Surgeon: Ailene Rud, MD;  Location[facility];  Service: Urology;  Laterality: N/A;   CYSTOSCOPY WITH INJECTION N/A 12/17/2013   Procedure: Ascencion Dike WITH INJECTION;  Surgeon: Ailene Rud, MD;  Location: [facility];  Service: Urology;  Laterality: N/A;   DILATION AND CURETTAGE OF UTERUS     EXCISION RIGHT NECK AND LEFT BREAST SEBACEOUS CYST  05/18/2002   HEMORRHOID BANDING  2014   PUBOVAGINAL SLING N/A 10/25/2016   Procedure: Gaynelle Arabian;  Surgeon: Carolan Clines, MD;  Location: [facility];  Service: Urology;  Laterality: N/A;   right litlle finger surgery  yrs ago   cyst removed x 4   TOTAL ABDOMINAL HYSTERECTOMY  1986   TUBAL LIGATION     VAGINAL PROLAPSE REPAIR N/A 08/27/2013   Procedure: ANTERIOR VAGINAL VAULT SUSPENSION, KELLY PLICATION WITH SACROSPINOUS LIGAMENT FIXATION AND XENFORM BOVINE DERMIS GRAFT AUGMENTATION, URETHRAL EXPLORATION, URETHROLYSIS, EXPLANTATION OF TVT TAPE, IMPLANTATION OF FLOSEAL, IMPLANTATION OF XENFORM BOVINE GRAFT IN PERIURETHRAL SPACE;  Surgeon: Ailene Rud, MD;  Location: [place];  Service: Urology;  Laterality: N/A   VAGINAL PROLAPSE REPAIR N/A 10/25/2016   Procedure: VAGINAL VAULT SUSPENSION with mesh and sacrospinous repair;  Surgeon: Carolan Clines, MD;  Location: [facility];  Service: Urology;  Laterality: N/A;   Family History  Problem Relation Age of Onset   Lung cancer Father    Coronary artery disease Father    Hypertension Mother    Heart disease Mother    Myelodysplastic syndrome Mother    Arthritis Mother    Cervical cancer Mother    Dementia Maternal Aunt    Fibromyalgia Sister    Hypertension Sister    Anemia Sister    Depression Sister    Hypertension Brother    Anemia Brother    Hypertension Brother    Hyperlipidemia Brother    Colon polyps Other        aunt   Heart disease Maternal  Uncle    Lung cancer Paternal Uncle    Kidney disease Sister        kidney stones, removed kidney   Dementia Maternal Aunt    Lung cancer Maternal Uncle    Other Paternal Uncle        brain Tumor   Pancreatic cancer Cousin    Colon cancer Neg Hx  Social History   Social History Narrative   She is retired and divorced and has 1 child   Occasional alcohol, former smoker no drug use   Right handed    Caffeine 2-3 cups per day   Lives at alone     Allergies as of 04/22/2021       Reactions   Meloxicam Swelling   Caused edema and Cr increase   Morphine Nausea And Vomiting   SEVERE   Sulfa Antibiotics Swelling        Medication List        Accurate as of April 22, 2021  1:35 PM. If you have any questions, ask your nurse or doctor.          atenolol 25 MG tablet Commonly known as: TENORMIN Take 0.5 tablets (12.5 mg total) by mouth daily.   calcium carbonate 600 MG Tabs tablet Commonly known as: OS-CAL Take 600 mg by mouth daily.   cetirizine 10 MG tablet Commonly known as: ZYRTEC Take 10 mg by mouth daily as needed for allergies.   cholecalciferol 25 MCG (1000 UNIT) tablet Commonly known as: VITAMIN D3 Take 1,000 Units by mouth daily.   dicyclomine 10 MG capsule Commonly known as: BENTYL Take 1 capsule (10 mg total) by mouth 2 (two) times daily.   DULoxetine 60 MG capsule Commonly known as: CYMBALTA Take 1 capsule (60 mg total) by mouth 2 (two) times daily.   escitalopram 20 MG tablet Commonly known as: LEXAPRO Take 1 tablet (20 mg total) by mouth daily.   fesoterodine 4 MG Tb24 tablet Commonly known as: TOVIAZ Take 4 mg by mouth daily.   FISH OIL PO Take 1 capsule by mouth daily.   fluticasone 50 MCG/ACT nasal spray Commonly known as: FLONASE Place 1 spray into both nostrils as needed for allergies.   LORazepam 0.5 MG tablet Commonly known as: ATIVAN Take 1 tablet (0.5 mg total) by mouth every 12 (twelve) hours as needed for anxiety (for  panic attack).   losartan-hydrochlorothiazide 50-12.5 MG tablet Commonly known as: HYZAAR Take 0.5 tablets by mouth daily.   omeprazole 40 MG capsule Commonly known as: PRILOSEC Take 1 capsule (40 mg total) by mouth 2 (two) times daily before a meal. 30 mins before breakfast and supper   pravastatin 40 MG tablet Commonly known as: PRAVACHOL Take 1 tablet (40 mg total) by mouth daily.   Restasis 0.05 % ophthalmic emulsion Generic drug: cycloSPORINE Place 1 drop into both eyes as needed for dry eyes.   tamoxifen 20 MG tablet Commonly known as: NOLVADEX Take 20 mg by mouth daily.   traMADol 50 MG tablet Commonly known as: ULTRAM Take 1 tablet (50 mg total) by mouth daily.   traZODone 100 MG tablet Commonly known as: DESYREL Take 1 tablet (100 mg total) by mouth at bedtime.   Turmeric Curcumin 500 MG Caps Take 1,000 mg by mouth daily.   Vitamin B-12 1000 MCG Subl Place 1 tablet under the tongue daily.   WOMENS MULTIVITAMIN PLUS PO Take 1 tablet by mouth daily.        All past medical history, surgical history, allergies, family history, immunizations andmedications were updated in the EMR today and reviewed under the history and medication portions of their EMR.      ROS: 14 pt review of systems performed and negative (unless mentioned in an HPI)  Objective: BP 118/70   Pulse 69   Temp 98 F (36.7 C) (Oral)   Ht 5\' 5"  (  1.651 m)   Wt 204 lb (92.5 kg)   SpO2 94%   BMI 33.95 kg/m  Gen: Afebrile. No acute distress. Nontoxic, pleasant female.  HENT: AT. [place].  Eyes:Pupils Equal Round Reactive to light, Extraocular movements intact,  Conjunctiva without redness, discharge or icterus. Neck/lymp/endocrine: Supple,no lymphadenopathy, no thyromegaly CV: RRR no murmur, no edema, +2/4 P posterior tibialis pulses Chest: CTAB, no wheeze or crackles Skin: no rashes, purpura or petechiae.  Neuro: Normal gait. PERLA. EOMi. Alert. Oriented x3 Psych: Normal affect, dress and  demeanor. Normal speech. Normal thought content and judgment..   Assessment/plan: JALEIYAH ALAS is a 75 y.o. female present for [facility] Depression with anxiety/insomnia/panic d/o/benzo use Stable.  - continue  Lexapro 20 mg daily- refills provided today -continue   Cymbalta 60 mg twice a day. - continue   trazodone 100 mg daily at bedtime. - continue  Ativan provided for 6 months. NCCS database reviewed and appropriate 04/22/21 - UDS - UTD - Controlled substance contract signed. - pt declined psychology referral.    Hypertension/morbid obesity/hyperlipidemia/CKD3/palpitations: - stable.  - continue  losartan-HCTZ (1/2 tab) - continue atenolol to 12.5 mg daily. If palpitations worsen, can return to atenolol 25 mg daily.   - continue statin - recent labs at cardiology reviewed.  - diet and exercise modifications recommended.   Vitamin D deficiency/osteoporosis:  Continue vit d supplement reclast - last tx d/t to 5 yr 01/2021  Arthritis/pain/lumbar pain: Stable.  - continue  tramadol for chronic arthritic pain. Encouraged her to take tramadol every 12 hours daily (was only routinely taking once).  - UDS UTD - contract up-to-date. - NCCS database reviewed 04/22/21 and appropriate.  - Follow-up in 5.5 months if needing prescription refilled.   Atypical lobular hyperplasia [facility]) of left breast Following with surgical team this month.   Pernicious anemia Continue B12  Other irritable bowel syndrome Stable.  Continue bentyl   Return in about 6 months (around 10/08/2021) for [place] (30 min).  No orders of the defined types were placed in this encounter.  Meds ordered this encounter  Medications   escitalopram (LEXAPRO) 20 MG tablet    Sig: Take 1 tablet (20 mg total) by mouth daily.    Dispense:  90 tablet    Refill:  1   losartan-hydrochlorothiazide (HYZAAR) 50-12.5 MG tablet    Sig: Take 0.5 tablets by mouth daily.    Dispense:  45 tablet    Refill:  1    DC prior scripts  for this medication. Thanks.   traZODone (DESYREL) 100 MG tablet    Sig: Take 1 tablet (100 mg total) by mouth at bedtime.    Dispense:  90 tablet    Refill:  1   atenolol (TENORMIN) 25 MG tablet    Sig: Take 0.5 tablets (12.5 mg total) by mouth daily.    Dispense:  45 tablet    Refill:  1    Please DC other scripts for this medication   DULoxetine (CYMBALTA) 60 MG capsule    Sig: Take 1 capsule (60 mg total) by mouth 2 (two) times daily.    Dispense:  180 capsule    Refill:  1   omeprazole (PRILOSEC) 40 MG capsule    Sig: Take 1 capsule (40 mg total) by mouth 2 (two) times daily before a meal. 30 mins before breakfast and supper    Dispense:  180 capsule    Refill:  3   pravastatin (PRAVACHOL) 40 MG tablet  Sig: Take 1 tablet (40 mg total) by mouth daily.    Dispense:  90 tablet    Refill:  3   dicyclomine (BENTYL) 10 MG capsule    Sig: Take 1 capsule (10 mg total) by mouth 2 (two) times daily.    Dispense:  180 capsule    Refill:  1   traMADol (ULTRAM) 50 MG tablet    Sig: Take 1 tablet (50 mg total) by mouth daily.    Dispense:  180 tablet    Refill:  1   LORazepam (ATIVAN) 0.5 MG tablet    Sig: Take 1 tablet (0.5 mg total) by mouth every 12 (twelve) hours as needed for anxiety (for panic attack).    Dispense:  180 tablet    Refill:  1    Referral Orders  No referral(s) requested today     Note is dictated utilizing voice recognition software. Although note has been proof read prior to signing, occasional typographical errors still can be missed. If any questions arise, please do not hesitate to call for verification.  Electronically signed by: Howard Pouch, DO [facility]

## 2021-04-23 DIAGNOSIS — R296 Repeated falls: Secondary | ICD-10-CM | POA: Diagnosis not present

## 2021-04-23 DIAGNOSIS — R2689 Other abnormalities of gait and mobility: Secondary | ICD-10-CM | POA: Diagnosis not present

## 2021-05-01 DIAGNOSIS — H35342 Macular cyst, hole, or pseudohole, left eye: Secondary | ICD-10-CM | POA: Diagnosis not present

## 2021-05-01 DIAGNOSIS — H17823 Peripheral opacity of cornea, bilateral: Secondary | ICD-10-CM | POA: Diagnosis not present

## 2021-05-01 DIAGNOSIS — H40013 Open angle with borderline findings, low risk, bilateral: Secondary | ICD-10-CM | POA: Diagnosis not present

## 2021-05-01 DIAGNOSIS — Z961 Presence of intraocular lens: Secondary | ICD-10-CM | POA: Diagnosis not present

## 2021-05-01 DIAGNOSIS — H43813 Vitreous degeneration, bilateral: Secondary | ICD-10-CM | POA: Diagnosis not present

## 2021-05-01 DIAGNOSIS — H524 Presbyopia: Secondary | ICD-10-CM | POA: Diagnosis not present

## 2021-05-05 DIAGNOSIS — R2689 Other abnormalities of gait and mobility: Secondary | ICD-10-CM | POA: Diagnosis not present

## 2021-05-05 DIAGNOSIS — R296 Repeated falls: Secondary | ICD-10-CM | POA: Diagnosis not present

## 2021-05-13 DIAGNOSIS — R296 Repeated falls: Secondary | ICD-10-CM | POA: Diagnosis not present

## 2021-05-13 DIAGNOSIS — R2689 Other abnormalities of gait and mobility: Secondary | ICD-10-CM | POA: Diagnosis not present

## 2021-05-15 ENCOUNTER — Telehealth: Payer: Self-pay | Admitting: Family Medicine

## 2021-05-15 NOTE — Telephone Encounter (Signed)
Received faxed discharge orders from [facility]. Placed in Dr. Lucita Lora front office inbox to sign and return.

## 2021-05-15 NOTE — Telephone Encounter (Signed)
Awaiting fax.

## 2021-05-15 NOTE — Telephone Encounter (Signed)
Form signed by PCP and faxed.

## 2021-06-01 ENCOUNTER — Telehealth: Payer: Self-pay | Admitting: Family Medicine

## 2021-06-01 NOTE — Telephone Encounter (Signed)
Patient would like to start taking Keto Blast diet pills and wants to speak to someone regarding possible interactions with her other medications. Please call patient to advise.

## 2021-06-01 NOTE — Telephone Encounter (Signed)
Please advise if ok to taking the following OTC medication.

## 2021-06-02 NOTE — Telephone Encounter (Signed)
Spoke with patient regarding recommendations,voiced understanding.  

## 2021-06-02 NOTE — Telephone Encounter (Signed)
Keto Blast diet pills/gummies are not FDA regulated.  I also do not list all of the ingredients on the label. The safety of this product cannot be established.  Therefore, I cannot give a professional opinion on the safety or efficacy of this product. I would not recommend them based on the lack of information alone.

## 2021-06-06 DIAGNOSIS — H524 Presbyopia: Secondary | ICD-10-CM | POA: Diagnosis not present

## 2021-06-06 DIAGNOSIS — H52209 Unspecified astigmatism, unspecified eye: Secondary | ICD-10-CM | POA: Diagnosis not present

## 2021-06-06 DIAGNOSIS — H5213 Myopia, bilateral: Secondary | ICD-10-CM | POA: Diagnosis not present

## 2021-06-22 DIAGNOSIS — M2550 Pain in unspecified joint: Secondary | ICD-10-CM | POA: Diagnosis not present

## 2021-06-22 DIAGNOSIS — Z6841 Body Mass Index (BMI) 40.0 and over, adult: Secondary | ICD-10-CM | POA: Diagnosis not present

## 2021-06-22 DIAGNOSIS — M150 Primary generalized (osteo)arthritis: Secondary | ICD-10-CM | POA: Diagnosis not present

## 2021-06-22 DIAGNOSIS — M797 Fibromyalgia: Secondary | ICD-10-CM | POA: Diagnosis not present

## 2021-06-22 DIAGNOSIS — M810 Age-related osteoporosis without current pathological fracture: Secondary | ICD-10-CM | POA: Diagnosis not present

## 2021-06-24 ENCOUNTER — Other Ambulatory Visit: Payer: Self-pay | Admitting: Rheumatology

## 2021-06-24 DIAGNOSIS — M810 Age-related osteoporosis without current pathological fracture: Secondary | ICD-10-CM

## 2021-07-01 ENCOUNTER — Ambulatory Visit: Payer: Medicare HMO

## 2021-08-12 ENCOUNTER — Other Ambulatory Visit: Payer: Self-pay

## 2021-08-12 ENCOUNTER — Ambulatory Visit: Payer: Medicare HMO

## 2021-09-14 ENCOUNTER — Other Ambulatory Visit: Payer: Self-pay | Admitting: Family Medicine

## 2021-09-16 ENCOUNTER — Other Ambulatory Visit: Payer: Self-pay

## 2021-09-16 ENCOUNTER — Ambulatory Visit (INDEPENDENT_AMBULATORY_CARE_PROVIDER_SITE_OTHER): Payer: Medicare HMO

## 2021-09-16 DIAGNOSIS — Z0000 Encounter for general adult medical examination without abnormal findings: Secondary | ICD-10-CM

## 2021-09-16 NOTE — Patient Instructions (Signed)
[name] [name] , Thank you for taking time to come for your Medicare Wellness Visit. I appreciate your ongoing commitment to your health goals. Please review the following plan we discussed and let me know if I can assist you in the future.   Screening recommendations/referrals: Colonoscopy: No Longer required  Mammogram: Done 03/26/21 repeat every year  Bone Density: Done 08/21/19 repeat every 3 years  Recommended yearly ophthalmology/optometry visit for glaucoma screening and checkup Recommended yearly dental visit for hygiene and checkup  Vaccinations: Influenza vaccine: Done 04/30/21 repeat every year  Pneumococcal vaccine: Up to date Tdap vaccine: Done 03/10/20 repeat every 10 year s Shingles vaccine: Completed 10/3, 09/05/19   Covid-19:Completed 3/18, 4/15, 05/23/20 & 04/30/21  Advanced directives: Copies in chart  Conditions/risks identified: work on losing weight   Next appointment: Follow up in one year for your annual wellness visit    Preventive Care 27 Years and Older, Female Preventive care refers to lifestyle choices and visits with your health care provider that can promote health and wellness. What does preventive care include? A yearly physical exam. This is also called an annual well check. Dental exams once or twice a year. Routine eye exams. Ask your health care provider how often you should have your eyes checked. Personal lifestyle choices, including: Daily care of your teeth and gums. Regular physical activity. Eating a healthy diet. Avoiding tobacco and drug use. Limiting alcohol use. Practicing safe sex. Taking low-dose aspirin every day. Taking vitamin and mineral supplements as recommended by your health care provider. What happens during an annual well check? The services and screenings done by your health care provider during your annual well check will depend on your age, overall health, lifestyle risk factors, and family history of disease. Counseling   Your health care provider may ask you questions about your: Alcohol use. Tobacco use. Drug use. Emotional well-being. Home and relationship well-being. Sexual activity. Eating habits. History of falls. Memory and ability to understand (cognition). Work and work Statistician. Reproductive health. Screening  You may have the following tests or measurements: Height, weight, and BMI. Blood pressure. Lipid and cholesterol levels. These may be checked every 5 years, or more frequently if you are over 70 years old. Skin check. Lung cancer screening. You may have this screening every year starting at age 30 if you have a 30-pack-year history of smoking and currently smoke or have quit within the past 15 years. Fecal occult blood test (FOBT) of the stool. You may have this test every year starting at age 24. Flexible sigmoidoscopy or colonoscopy. You may have a sigmoidoscopy every 5 years or a colonoscopy every 10 years starting at age 5. Hepatitis C blood test. Hepatitis B blood test. Sexually transmitted disease (STD) testing. Diabetes screening. This is done by checking your blood sugar (glucose) after you have not eaten for a while (fasting). You may have this done every 1-3 years. Bone density scan. This is done to screen for osteoporosis. You may have this done starting at age 58. Mammogram. This may be done every 1-2 years. Talk to your health care provider about how often you should have regular mammograms. Talk with your health care provider about your test results, treatment options, and if necessary, the need for more tests. Vaccines  Your health care provider may recommend certain vaccines, such as: Influenza vaccine. This is recommended every year. Tetanus, diphtheria, and acellular pertussis (Tdap, Td) vaccine. You may need a Td booster every 10 years. Zoster vaccine. You may  need this after age 46. Pneumococcal 13-valent conjugate (PCV13) vaccine. One dose is recommended  after age 18. Pneumococcal polysaccharide (PPSV23) vaccine. One dose is recommended after age 64. Talk to your health care provider about which screenings and vaccines you need and how often you need them. This information is not intended to replace advice given to you by your health care provider. Make sure you discuss any questions you have with your health care provider. Document Released: 10/17/2015 Document Revised: 06/09/2016 Document Reviewed: 07/22/2015 Elsevier Interactive Patient Education  2017 [employer] Prevention in the Home Falls can cause injuries. They can happen to people of all ages. There are many things you can do to make your home safe and to help prevent falls. What can I do on the outside of my home? Regularly fix the edges of walkways and driveways and fix any cracks. Remove anything that might make you trip as you walk through a door, such as a raised step or threshold. Trim any bushes or trees on the path to your home. Use bright outdoor lighting. Clear any walking paths of anything that might make someone trip, such as rocks or tools. Regularly check to see if handrails are loose or broken. Make sure that both sides of any steps have handrails. Any raised decks and porches should have guardrails on the edges. Have any leaves, snow, or ice cleared regularly. Use sand or salt on walking paths during winter. Clean up any spills in your garage right away. This includes oil or grease spills. What can I do in the bathroom? Use night lights. Install grab bars by the toilet and in the tub and shower. Do not use towel bars as grab bars. Use non-skid mats or decals in the tub or shower. If you need to sit down in the shower, use a plastic, non-slip stool. Keep the floor dry. Clean up any water that spills on the floor as soon as it happens. Remove soap buildup in the tub or shower regularly. Attach bath mats securely with double-sided non-slip rug tape. Do not  have throw rugs and other things on the floor that can make you trip. What can I do in the bedroom? Use night lights. Make sure that you have a light by your bed that is easy to reach. Do not use any sheets or blankets that are too big for your bed. They should not hang down onto the floor. Have a firm chair that has side arms. You can use this for support while you get dressed. Do not have throw rugs and other things on the floor that can make you trip. What can I do in the kitchen? Clean up any spills right away. Avoid walking on wet floors. Keep items that you use a lot in easy-to-reach places. If you need to reach something above you, use a strong step stool that has a grab bar. Keep electrical cords out of the way. Do not use floor polish or wax that makes floors slippery. If you must use wax, use non-skid floor wax. Do not have throw rugs and other things on the floor that can make you trip. What can I do with my stairs? Do not leave any items on the stairs. Make sure that there are handrails on both sides of the stairs and use them. Fix handrails that are broken or loose. Make sure that handrails are as long as the stairways. Check any carpeting to make sure that it is firmly attached  to the stairs. Fix any carpet that is loose or worn. Avoid having throw rugs at the top or bottom of the stairs. If you do have throw rugs, attach them to the floor with carpet tape. Make sure that you have a light switch at the top of the stairs and the bottom of the stairs. If you do not have them, ask someone to add them for you. What else can I do to help prevent falls? Wear shoes that: Do not have high heels. Have rubber bottoms. Are comfortable and fit you well. Are closed at the toe. Do not wear sandals. If you use a stepladder: Make sure that it is fully opened. Do not climb a closed stepladder. Make sure that both sides of the stepladder are locked into place. Ask someone to hold it for  you, if possible. Clearly mark and make sure that you can see: Any grab bars or handrails. First and last steps. Where the edge of each step is. Use tools that help you move around (mobility aids) if they are needed. These include: Canes. Walkers. Scooters. Crutches. Turn on the lights when you go into a dark area. Replace any light bulbs as soon as they burn out. Set up your furniture so you have a clear path. Avoid moving your furniture around. If any of your floors are uneven, fix them. If there are any pets around you, be aware of where they are. Review your medicines with your doctor. Some medicines can make you feel dizzy. This can increase your chance of falling. Ask your doctor what other things that you can do to help prevent falls. This information is not intended to replace advice given to you by your health care provider. Make sure you discuss any questions you have with your health care provider. Document Released: 07/17/2009 Document Revised: 02/26/2016 Document Reviewed: 10/25/2014 Elsevier Interactive Patient Education  2017 [employer].

## 2021-09-16 NOTE — Progress Notes (Signed)
Virtual Visit via Telephone Note  I connected with  [name] [name] on 09/16/21 at 10:15 AM EST by telephone and verified that I am speaking with the correct person using two identifiers.  Medicare Annual Wellness visit completed telephonically due to Covid-19 pandemic.   Persons participating in this call: This Health Coach and this patient.   Location: Patient: Home Provider: Office    I discussed the limitations, risks, security and privacy concerns of performing an evaluation and management service by telephone and the availability of in person appointments. The patient expressed understanding and agreed to proceed.  Unable to perform video visit due to video visit attempted and failed and/or patient does not have video capability.   Some vital signs may be absent or patient reported.   Willette Brace, LPN   Subjective:   [name] [name] is a 75 y.o. female who presents for Medicare Annual (Subsequent) preventive examination.  Review of Systems     Cardiac Risk Factors include: advanced age (>49men, >21 women);hypertension;dyslipidemia;obesity (BMI >30kg/m2)     Objective:    Today's Vitals   09/16/21 1014  PainSc: 6    There is no height or weight on file to calculate BMI.  Advanced Directives 09/16/2021 06/25/2020 10/31/2019 10/24/2019 01/22/2019 01/16/2018 12/10/2016  Does Patient Have a Medical Advance Directive? Yes Yes Yes Yes Yes Yes Yes  Type of Paramedic of [place];Living will [place];Living will [place];Living will Living will;Healthcare Power of Attorney Living will;Healthcare Power of [place];Living will  Does patient want to make changes to medical advance directive? - - No - Patient declined No - Guardian declined No - Patient declined - -  Copy of [place] in Chart? Yes - validated most recent copy scanned in chart (See  row information) Yes - validated most recent copy scanned in chart (See row information) Yes - validated most recent copy scanned in chart (See row information) Yes - validated most recent copy scanned in chart (See row information) No - copy requested Yes Yes  Would patient like information on creating a medical advance directive? - - - No - Guardian declined - - -    Current Medications (verified) Outpatient Encounter Medications as of 09/16/2021  Medication Sig   atenolol (TENORMIN) 25 MG tablet Take 0.5 tablets (12.5 mg total) by mouth daily.   calcium carbonate (OS-CAL) 600 MG TABS Take 600 mg by mouth daily.   cetirizine (ZYRTEC) 10 MG tablet Take 10 mg by mouth daily as needed for allergies.   cholecalciferol (VITAMIN D3) 25 MCG (1000 UNIT) tablet Take 1,000 Units by mouth daily.   Cyanocobalamin (VITAMIN B-12) 1000 MCG SUBL Place 1 tablet under the tongue daily.    dicyclomine (BENTYL) 10 MG capsule Take 1 capsule (10 mg total) by mouth 2 (two) times daily.   DULoxetine (CYMBALTA) 60 MG capsule Take 1 capsule (60 mg total) by mouth 2 (two) times daily.   escitalopram (LEXAPRO) 20 MG tablet Take 1 tablet (20 mg total) by mouth daily.   fluticasone (FLONASE) 50 MCG/ACT nasal spray Place 1 spray into both nostrils as needed for allergies.   LORazepam (ATIVAN) 0.5 MG tablet Take 1 tablet (0.5 mg total) by mouth every 12 (twelve) hours as needed for anxiety (for panic attack).   losartan-hydrochlorothiazide (HYZAAR) 50-12.5 MG tablet Take 0.5 tablets by mouth daily.   Multiple Vitamins-Minerals (WOMENS MULTIVITAMIN PLUS PO) Take 1  tablet by mouth daily.   Omega-3 Fatty Acids (FISH OIL PO) Take 1 capsule by mouth daily.    omeprazole (PRILOSEC) 40 MG capsule Take 1 capsule (40 mg total) by mouth 2 (two) times daily before a meal. 30 mins before breakfast and supper   pravastatin (PRAVACHOL) 40 MG tablet Take 1 tablet (40 mg total) by mouth daily.   RESTASIS 0.05 % ophthalmic emulsion Place 1  drop into both eyes as needed for dry eyes.   tamoxifen (NOLVADEX) 20 MG tablet Take 20 mg by mouth daily.    traMADol (ULTRAM) 50 MG tablet Take 1 tablet (50 mg total) by mouth daily.   traZODone (DESYREL) 100 MG tablet Take 1 tablet (100 mg total) by mouth at bedtime.   Turmeric Curcumin 500 MG CAPS Take 1,000 mg by mouth daily.    fesoterodine (TOVIAZ) 4 MG TB24 tablet Take 4 mg by mouth daily. (Patient not taking: Reported on 09/16/2021)   FLUAD QUADRIVALENT 0.5 ML injection    MODERNA COVID-19 BIVAL BOOSTER 50 MCG/0.5ML injection    MODERNA COVID-19 VACCINE 100 MCG/0.5ML injection    No facility-administered encounter medications on file as of 09/16/2021.    Allergies (verified) Meloxicam, Morphine, and Sulfa antibiotics   History: Past Medical History:  Diagnosis Date   Anxiety    Arthritis    oa   At moderate risk for fall 02/04/2021   Atypical lobular hyperplasia [facility]) of left breast 12/06/2019   Body mass index (BMI) 45.0-49.9, adult ([place]) 03/26/2021   Cardiac murmur 08/06/2020   Chronic cystitis    CKD (chronic kidney disease) stage 3, GFR 30-59 ml/min (HCC) 04/13/2017   Cystic thyroid nodule 08/09/2018   7x10 mm, right lobe   DDD (degenerative disc disease), cervical    Depression with anxiety 11/04/2007   Current med: xanax PRN, zoloft 100 mg qd.     Dysphagia 07/28/2018   Essential hypertension 11/04/2007   Current meds: lisinopril/HCTZ 10/12.5 mg qd; tenormin 25 mg qd     Fibromyalgia    Gait instability 01/22/2019   GERD (gastroesophageal reflux disease)    Heart murmur    History of duodenal ulcer    2007   History of gastritis    2007   Hoarseness 08/22/2015   GI- did not feel gerd, ENT did not feel ent, neuro- felt possibly ent--> sent for 2nd opinion ent.    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 This patient is considered osteoporotic according to [facility]) criteria. L-1, L-4 were excluded due to degenerative changes. Per the official positions of the ISCD, it is not possible to quantitatively compare BMD or calculate an [facility] between exams done at different facilities.    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 MRI scheduled. 04/16/2016    Seasonal allergies 02/19/2016   Unspecified venous (peripheral) insufficiency    wears compression hose   Urine incontinence    Vitamin D deficiency 07/20/2008   Taking 1000 iu qd     Vitamin D deficiency, unspecified    Past Surgical History:  Procedure Laterality Date   ANTERIOR AND POSTERIOR VAGINAL REPAIR  04/29/2004   AND TRANSVAGINAL TAPE SLING   ANTERIOR CERVICAL DECOMP/DISCECTOMY FUSION  1999   APPENDECTOMY  1984   BILATERAL SALPINGOOPHORECTOMY  1984   Bladder stimulator     BREAST BIOPSY Right 03/2021   BREAST LUMPECTOMY WITH RADIOACTIVE SEED LOCALIZATION Left 10/31/2019   Procedure: LEFT BREAST LUMPECTOMY WITH RADIOACTIVE SEED LOCALIZATION;  Surgeon: Erroll Luna, MD;  Location: [place];  Service: General;  Laterality: Left;   CARDIAC CATHETERIZATION  10-03-2002   DR Dannielle Burn   NORMAL CORONARIES ARTERIES/  EF 55%   CYSTOSCOPY N/A 08/27/2013   Procedure: CYSTOSCOPY;  Surgeon: Ailene Rud, MD;   Location: [facility];  Service: Urology;  Laterality: N/A;   CYSTOSCOPY WITH INJECTION N/A 12/17/2013   Procedure: Ascencion Dike WITH INJECTION;  Surgeon: Ailene Rud, MD;  Location[facility];  Service: Urology;  Laterality: N/A;   DILATION AND CURETTAGE OF UTERUS     EXCISION RIGHT NECK AND LEFT BREAST SEBACEOUS CYST  05/18/2002   HEMORRHOID BANDING  2014   PUBOVAGINAL SLING N/A 10/25/2016   Procedure: Gaynelle Arabian;  Surgeon: Carolan Clines, MD;  Location: [facility];  Service: Urology;  Laterality: N/A;   right litlle finger surgery  yrs ago   cyst removed x 4   TOTAL ABDOMINAL HYSTERECTOMY  1986   TUBAL LIGATION     VAGINAL PROLAPSE REPAIR N/A 08/27/2013   Procedure: ANTERIOR VAGINAL VAULT SUSPENSION, KELLY PLICATION WITH SACROSPINOUS LIGAMENT FIXATION AND XENFORM BOVINE DERMIS GRAFT AUGMENTATION, URETHRAL EXPLORATION, URETHROLYSIS, EXPLANTATION OF TVT TAPE, IMPLANTATION OF FLOSEAL, IMPLANTATION OF XENFORM BOVINE GRAFT IN PERIURETHRAL SPACE;  Surgeon: Ailene Rud, MD;  Location: [place];  Service: Urology;  Laterality: N/A   VAGINAL PROLAPSE REPAIR N/A 10/25/2016   Procedure: VAGINAL VAULT SUSPENSION with mesh and sacrospinous repair;  Surgeon: Carolan Clines, MD;  Location: [facility];  Service: Urology;  Laterality: N/A;   Family History  Problem Relation Age of Onset   Lung cancer Father    Coronary artery disease Father    Hypertension Mother    Heart disease Mother    Myelodysplastic syndrome Mother    Arthritis Mother    Cervical cancer Mother    Dementia Maternal Aunt    Fibromyalgia Sister    Hypertension Sister    Anemia Sister    Depression Sister    Hypertension Brother    Anemia Brother    Hypertension Brother    Hyperlipidemia Brother    Colon polyps Other        aunt   Heart disease Maternal Uncle    Lung cancer Paternal Uncle    Kidney disease Sister        kidney stones, removed  kidney   Dementia Maternal Aunt    Lung cancer Maternal Uncle    Other Paternal Uncle        brain Tumor   Pancreatic cancer Cousin    Colon cancer Neg Hx    Social History   Socioeconomic History   Marital status: Divorced    Spouse name: Not on file  Number of children: 1   Years of education: Not on file   Highest education level: 12th grade  Occupational History   Occupation: Retired  Tobacco Use   Smoking status: Former    Packs/day: 1.00    Years: 15.00    Pack years: 15.00    Types: Cigarettes    Start date: 12/06/1993    Quit date: 08/09/2009    Years since quitting: 12.1   Smokeless tobacco: Never   Tobacco comments:    quit smoking 5 years ago  Vaping Use   Vaping Use: Never used  Substance and Sexual Activity   Alcohol use: Not Currently    Comment: ocassional   Drug use: No   Sexual activity: Not Currently    Partners: Male  Other Topics Concern   Not on file  Social History Narrative   She is retired and divorced and has 1 child   Occasional alcohol, former smoker no drug use   Right handed    Caffeine 2-3 cups per day   Lives at alone    Social Determinants of Health   Financial Resource Strain: Low Risk    Difficulty of Paying Living Expenses: Not hard at all  Food Insecurity: No Food Insecurity   Worried About Charity fundraiser in the Last Year: Never true   Arboriculturist in the Last Year: Never true  Transportation Needs: No Transportation Needs   Lack of Transportation (Medical): No   Lack of Transportation (Non-Medical): No  Physical Activity: Inactive   Days of Exercise per Week: 0 days   Minutes of Exercise per Session: 0 min  Stress: Stress Concern Present   Feeling of Stress : To some extent  Social Connections: Moderately Isolated   Frequency of Communication with Friends and Family: More than three times a week   Frequency of Social Gatherings with Friends and Family: Three times a week   Attends Religious Services: More than  4 times per year   Active Member of Clubs or Organizations: No   Attends Archivist Meetings: Never   Marital Status: Divorced    Tobacco Counseling Counseling given: Not Answered Tobacco comments: quit smoking 5 years ago   Clinical Intake:  Pre-visit preparation completed: Yes  Pain : 0-10 Pain Score: 6  Pain Type: Chronic pain Pain Location: Generalized Pain Descriptors / Indicators: Aching, Sore, Cramping Pain Onset: More than a month ago Pain Frequency: Intermittent     BMI - recorded: 33.95 Nutritional Status: BMI > 30  Obese Nutritional Risks: None Diabetes: No  How often do you need to have someone help you when you read instructions, pamphlets, or other written materials from your doctor or pharmacy?: 1 - Never  Diabetic?No  Interpreter Needed?: No  Information entered by :: Charlott Rakes, LPN   Activities of Daily Living In your present state of health, do you have any difficulty performing the following activities: 09/16/2021  Hearing? N  Vision? N  Difficulty concentrating or making decisions? N  Walking or climbing stairs? Y  Comment knees  Dressing or bathing? N  Doing errands, shopping? N  Preparing Food and eating ? N  Using the Toilet? N  In the past six months, have you accidently leaked urine? N  Do you have problems with loss of bowel control? N  Managing your Medications? N  Managing your Finances? N  Housekeeping or managing your Housekeeping? N  Some recent data might be hidden    Patient  Care Team: Ma Hillock, DO as PCP - General (Family Medicine) Gatha Mayer, MD as Consulting Physician (Gastroenterology) Calvert Cantor, MD as Consulting Physician (Ophthalmology) [facility] [place] [facility], [facility], Carlis Abbott, Elon Alas, MD as Consulting Physician (Neurology)  Indicate any recent Medical Services you may have received from other  than [facility] providers in the past year (date may be approximate).     Assessment:   This is a routine wellness examination for [place].  Hearing/Vision screen Hearing Screening - Comments:: Pt denies any hearing issues Vision Screening - Comments:: Pt follows up with [facility] for annual eye exams   Dietary issues and exercise activities discussed: Current Exercise Habits: The patient does not participate in regular exercise at present   Goals Addressed             This Visit's Progress    Patient Stated       Working on lose weight        Depression Screen PHQ 2/9 Scores 09/16/2021 04/22/2021 02/04/2021 07/22/2020 06/25/2020 04/24/2020 10/10/2019  PHQ - 2 Score 1 1 1  0 0 2 0  PHQ- 9 Score - 6 4 - - 7 1  Exception Documentation - - - - - - -    Fall Risk Fall Risk  09/16/2021 02/04/2021 07/22/2020 06/25/2020 10/10/2019  Falls in the past year? 1 1 1 1 1   Number falls in past yr: 1 0 1 1 0  Injury with Fall? 0 0 0 0 0  Risk for fall due to : Impaired vision;Impaired balance/gait;Impaired mobility Impaired balance/gait - History of fall(s) Medication side effect  Follow up Falls prevention discussed Falls evaluation completed Falls evaluation completed Falls prevention discussed Falls evaluation completed;Education provided;Falls prevention discussed    FALL RISK PREVENTION PERTAINING TO THE HOME:  Any stairs in or around the home? Yes  If so, are there any without handrails? No  Home free of loose throw rugs in walkways, pet beds, electrical cords, etc? Yes  Adequate lighting in your home to reduce risk of falls? Yes   ASSISTIVE DEVICES UTILIZED TO PREVENT FALLS:  Life alert? No  Use of a cane, walker or w/c? No  Grab bars in the bathroom? Yes  Shower chair or bench in shower? No  Elevated toilet seat or a handicapped toilet? No   TIMED UP AND GO:  Was the test performed? No .   Cognitive Function: MMSE - Mini Mental State Exam 01/16/2018 11/24/2015  Orientation  to time 5 5  Orientation to Place 5 5  Registration 3 3  Attention/ Calculation 5 5  Recall 3 3  Language- name 2 objects 2 2  Language- repeat 1 1  Language- follow 3 step command 3 3  Language- read & follow direction 1 1  Write a sentence 1 1  Copy design 1 1  Total score 30 30        Immunizations Immunization History  Administered Date(s) Administered   Influenza Split 07/02/2011   Influenza Whole 07/16/2008, 07/04/2009, 07/14/2010   Influenza, High Dose Seasonal PF 07/04/2018, 06/20/2019   Influenza,inj,Quad PF,6+ Mos 06/29/2013, 07/08/2014, 06/23/2017   Influenza-Unspecified 07/02/2015, 06/22/2019, 04/30/2021   Moderna Sars-Covid-2 Vaccination 12/20/2019, 01/17/2020, 05/23/2020, 04/30/2021   Pneumococcal Conjugate-13 05/15/2014   Pneumococcal Polysaccharide-23 10/04/2006, 09/11/2015   Td 03/19/2010   Tdap 03/10/2020   Zoster Recombinat (Shingrix) 07/07/2019, 09/05/2019   Zoster, Live 07/25/2015    TDAP status: Up to date  Flu Vaccine  status: Up to date  Pneumococcal vaccine status: Up to date  Covid-19 vaccine status: Completed vaccines  Qualifies for Shingles Vaccine? Yes   Zostavax completed Yes   Shingrix Completed?: Yes  Screening Tests Health Maintenance  Topic Date Due   INFLUENZA VACCINE  05/04/2021   COVID-19 Vaccine (5 - Booster for Moderna series) 06/25/2021   DEXA SCAN  08/20/2022   MAMMOGRAM  02/04/2023   TETANUS/TDAP  03/10/2030   Pneumonia Vaccine 44+ Years old  Completed   Hepatitis C Screening  Completed   Zoster Vaccines- Shingrix  Completed   HPV VACCINES  Aged Out    Health Maintenance  Health Maintenance Due  Topic Date Due   INFLUENZA VACCINE  05/04/2021   COVID-19 Vaccine (5 - Booster for Moderna series) 06/25/2021    Colorectal cancer screening: No longer required.   Mammogram status: Completed 03/26/21. Repeat every year  Bone Density status: Completed 08/21/19. Results reflect: Bone density results: OSTEOPOROSIS.  Repeat every 3 years.   Additional Screening:  Hepatitis C Screening: Completed 03/11/15  Vision Screening: Recommended annual ophthalmology exams for early detection of glaucoma and other disorders of the eye. Is the patient up to date with their annual eye exam?  Yes  Who is the provider or what is the name of the office in which the patient attends annual eye exams? [facility]  If pt is not established with a provider, would they like to be referred to a provider to establish care? No .   Dental Screening: Recommended annual dental exams for proper oral hygiene  Community Resource Referral / Chronic Care Management: CRR required this visit?  No   CCM required this visit?  No      Plan:     I have personally reviewed and noted the following in the patients chart:   Medical and social history Use of alcohol, tobacco or illicit drugs  Current medications and supplements including opioid prescriptions.  Functional ability and status Nutritional status Physical activity Advanced directives List of other physicians Hospitalizations, surgeries, and ER visits in previous 12 months Vitals Screenings to include cognitive, depression, and falls Referrals and appointments  In addition, I have reviewed and discussed with patient certain preventive protocols, quality metrics, and best practice recommendations. A written personalized care plan for preventive services as well as general preventive health recommendations were provided to patient.     Willette Brace, LPN   [id number]   Nurse Notes: Pt is requesting to discuss diet control options at next visit.

## 2021-10-06 ENCOUNTER — Telehealth: Payer: Self-pay

## 2021-10-06 DIAGNOSIS — G4700 Insomnia, unspecified: Secondary | ICD-10-CM

## 2021-10-06 MED ORDER — TRAZODONE HCL 100 MG PO TABS: 100.0000 mg | ORAL_TABLET | Freq: Every day | ORAL | 0 refills | Status: DC

## 2021-10-06 NOTE — Telephone Encounter (Signed)
Patient refill request.  [facility]  traZODone (DESYREL) 100 MG tablet [[id number]]

## 2021-10-06 NOTE — Telephone Encounter (Signed)
Rx sent 

## 2021-10-10 ENCOUNTER — Other Ambulatory Visit: Payer: Self-pay | Admitting: Family Medicine

## 2021-10-12 ENCOUNTER — Ambulatory Visit: Payer: Medicare HMO | Admitting: Family Medicine

## 2021-10-30 ENCOUNTER — Other Ambulatory Visit: Payer: Self-pay

## 2021-10-30 ENCOUNTER — Ambulatory Visit (INDEPENDENT_AMBULATORY_CARE_PROVIDER_SITE_OTHER): Payer: Medicare HMO | Admitting: Family Medicine

## 2021-10-30 ENCOUNTER — Encounter: Payer: Self-pay | Admitting: Family Medicine

## 2021-10-30 VITALS — BP 128/87 | HR 93 | Temp 98.7°F | Wt 203.0 lb

## 2021-10-30 DIAGNOSIS — M1712 Unilateral primary osteoarthritis, left knee: Secondary | ICD-10-CM | POA: Diagnosis not present

## 2021-10-30 DIAGNOSIS — M5030 Other cervical disc degeneration, unspecified cervical region: Secondary | ICD-10-CM

## 2021-10-30 DIAGNOSIS — Z79899 Other long term (current) drug therapy: Secondary | ICD-10-CM

## 2021-10-30 DIAGNOSIS — I700 Atherosclerosis of aorta: Secondary | ICD-10-CM

## 2021-10-30 DIAGNOSIS — K580 Irritable bowel syndrome with diarrhea: Secondary | ICD-10-CM

## 2021-10-30 DIAGNOSIS — E559 Vitamin D deficiency, unspecified: Secondary | ICD-10-CM | POA: Diagnosis not present

## 2021-10-30 DIAGNOSIS — F419 Anxiety disorder, unspecified: Secondary | ICD-10-CM | POA: Insufficient documentation

## 2021-10-30 DIAGNOSIS — M19072 Primary osteoarthritis, left ankle and foot: Secondary | ICD-10-CM

## 2021-10-30 DIAGNOSIS — M810 Age-related osteoporosis without current pathological fracture: Secondary | ICD-10-CM | POA: Diagnosis not present

## 2021-10-30 DIAGNOSIS — K219 Gastro-esophageal reflux disease without esophagitis: Secondary | ICD-10-CM

## 2021-10-30 DIAGNOSIS — M545 Low back pain, unspecified: Secondary | ICD-10-CM

## 2021-10-30 DIAGNOSIS — R002 Palpitations: Secondary | ICD-10-CM

## 2021-10-30 DIAGNOSIS — E781 Pure hyperglyceridemia: Secondary | ICD-10-CM | POA: Diagnosis not present

## 2021-10-30 DIAGNOSIS — R1310 Dysphagia, unspecified: Secondary | ICD-10-CM

## 2021-10-30 DIAGNOSIS — Z0289 Encounter for other administrative examinations: Secondary | ICD-10-CM | POA: Diagnosis not present

## 2021-10-30 DIAGNOSIS — M19071 Primary osteoarthritis, right ankle and foot: Secondary | ICD-10-CM

## 2021-10-30 DIAGNOSIS — G4700 Insomnia, unspecified: Secondary | ICD-10-CM | POA: Diagnosis not present

## 2021-10-30 DIAGNOSIS — J302 Other seasonal allergic rhinitis: Secondary | ICD-10-CM

## 2021-10-30 DIAGNOSIS — Z6841 Body Mass Index (BMI) 40.0 and over, adult: Secondary | ICD-10-CM

## 2021-10-30 DIAGNOSIS — D510 Vitamin B12 deficiency anemia due to intrinsic factor deficiency: Secondary | ICD-10-CM | POA: Diagnosis not present

## 2021-10-30 DIAGNOSIS — F330 Major depressive disorder, recurrent, mild: Secondary | ICD-10-CM | POA: Diagnosis not present

## 2021-10-30 DIAGNOSIS — M79604 Pain in right leg: Secondary | ICD-10-CM

## 2021-10-30 DIAGNOSIS — I10 Essential (primary) hypertension: Secondary | ICD-10-CM | POA: Diagnosis not present

## 2021-10-30 DIAGNOSIS — F410 Panic disorder [episodic paroxysmal anxiety] without agoraphobia: Secondary | ICD-10-CM

## 2021-10-30 DIAGNOSIS — M797 Fibromyalgia: Secondary | ICD-10-CM

## 2021-10-30 DIAGNOSIS — N1832 Chronic kidney disease, stage 3b: Secondary | ICD-10-CM

## 2021-10-30 LAB — VITAMIN B12: Vitamin B-12: 285 pg/mL (ref 211–911)

## 2021-10-30 LAB — CBC
HCT: 36.5 % (ref 36.0–46.0)
Hemoglobin: 11.6 g/dL — ABNORMAL LOW (ref 12.0–15.0)
MCHC: 31.8 g/dL (ref 30.0–36.0)
MCV: 87.5 fl (ref 78.0–100.0)
Platelets: 273 10*3/uL (ref 150.0–400.0)
RBC: 4.17 Mil/uL (ref 3.87–5.11)
RDW: 14.2 % (ref 11.5–15.5)
WBC: 7.2 10*3/uL (ref 4.0–10.5)

## 2021-10-30 LAB — BASIC METABOLIC PANEL
BUN: 19 mg/dL (ref 6–23)
CO2: 33 mEq/L — ABNORMAL HIGH (ref 19–32)
Calcium: 10 mg/dL (ref 8.4–10.5)
Chloride: 102 mEq/L (ref 96–112)
Creatinine, Ser: 1.22 mg/dL — ABNORMAL HIGH (ref 0.40–1.20)
GFR: 43.68 mL/min — ABNORMAL LOW (ref >60.00)
Glucose, Bld: 84 mg/dL (ref 70–99)
Potassium: 4 mEq/L (ref 3.5–5.1)
Sodium: 142 mEq/L (ref 135–145)

## 2021-10-30 LAB — VITAMIN D 25 HYDROXY (VIT D DEFICIENCY, FRACTURES): VITD: 47.14 ng/mL (ref 30.00–100.00)

## 2021-10-30 MED ORDER — ATENOLOL 25 MG PO TABS: 12.5000 mg | ORAL_TABLET | Freq: Every day | ORAL | 1 refills | Status: DC

## 2021-10-30 MED ORDER — DICYCLOMINE HCL 10 MG PO CAPS: ORAL_CAPSULE | ORAL | 1 refills | Status: DC

## 2021-10-30 MED ORDER — PRAVASTATIN SODIUM 40 MG PO TABS: 40.0000 mg | ORAL_TABLET | Freq: Every day | ORAL | 3 refills | Status: DC

## 2021-10-30 MED ORDER — TRAZODONE HCL 100 MG PO TABS: 100.0000 mg | ORAL_TABLET | Freq: Every day | ORAL | 1 refills | Status: DC

## 2021-10-30 MED ORDER — LOSARTAN POTASSIUM-HCTZ 50-12.5 MG PO TABS: 0.5000 | ORAL_TABLET | Freq: Every day | ORAL | 1 refills | Status: DC

## 2021-10-30 MED ORDER — LORAZEPAM 0.5 MG PO TABS: 0.5000 mg | ORAL_TABLET | Freq: Two times a day (BID) | ORAL | 1 refills | Status: DC | PRN

## 2021-10-30 MED ORDER — TRAMADOL HCL 50 MG PO TABS: 50.0000 mg | ORAL_TABLET | Freq: Every day | ORAL | 1 refills | Status: DC

## 2021-10-30 MED ORDER — ESCITALOPRAM OXALATE 20 MG PO TABS: 20.0000 mg | ORAL_TABLET | Freq: Every day | ORAL | 1 refills | Status: DC

## 2021-10-30 MED ORDER — DULOXETINE HCL 60 MG PO CPEP: 60.0000 mg | ORAL_CAPSULE | Freq: Two times a day (BID) | ORAL | 1 refills | Status: DC

## 2021-10-30 MED ORDER — OMEPRAZOLE 40 MG PO CPDR: 40.0000 mg | DELAYED_RELEASE_CAPSULE | Freq: Two times a day (BID) | ORAL | 3 refills | Status: DC

## 2021-10-30 NOTE — Patient Instructions (Signed)
Great to see you today.  I have refilled the medication(s) we provide.   If labs were collected, we will inform you of lab results once received either by echart message or telephone call.   - echart message- for normal results that have been seen by the patient already.   - telephone call: abnormal results or if patient has not viewed results in their echart.  

## 2021-10-30 NOTE — Progress Notes (Signed)
Patient ID: [name] [name], female  DOB: 1946-01-03, 76 y.o.   MRN: [id number] Patient Care Team    Relationship Specialty Notifications Start End  Ma Hillock, DO PCP - General Family Medicine  06/02/15   Gatha Mayer, MD Consulting Physician Gastroenterology  11/24/15   Calvert Cantor, MD Consulting Physician Ophthalmology  11/24/15   Specialists, [facility]  Orthopedic Surgery  04/12/16   Pa, [facility]    01/16/18   Rheumatology, [facility]    01/16/18   Kathrynn Ducking, MD (Inactive) Consulting Physician Neurology  10/24/18     Chief Complaint  Patient presents with   Follow-up    Pt is not fasting     Subjective: [name] [name] is a 76 y.o.  female present for [facility] All past medical history, surgical history, allergies, family history, immunizations, medications and social history were updated in the electronic medical record today. All recent labs, ED visits and hospitalizations within the last year were reviewed.  Hypertension/hyperlipidemia/CKD3:  Pt reports compliance with Pravastatin 40 mg, losartan-HCTZ 50-12.5 mg daily mg and atenolol 12.5 mg. Patient denies chest pain, shortness of breath, dizziness or lower extremity edema.   Pt does not take daily baby ASA. Pt is  prescribed statin. Labs completed today Diet: Low-sodium Exercise: Attempts RF: Hypertension, hyperlipidemia. Family history of heart disease, former smoker, obesity   Anxiety/panic attack/insomnia: Patient reports compliance  with lexapro 20 mg daily, Cymbalta twice a day, trazodone 100 mg nightly.. She had been on zoloft and tapered off when lexapro started. Cymbalta has been at maximum dose and likely chosen secondary to her lumbar/arthritis issues.   She does take  Ativan twice daily.She feels her mental health is doing well on current regimen.     Arthritis/pain/lumbar pain with radiation down right leg: Patient reports compliance with tramadol 50 mg twice a day when  necessary for arthritic pain.She suffers from low back pain .  She has h/o anterior cervical decompression/discectomy in the past. Indication for chronic opioid: Moderate to severe arthritis and osteoporosis discomfort Medication and dose: Tramadol 50 mg twice daily as needed # pills per: #60 Last UDS date: UTD Pain contract signed (Y/N): Yes Date narcotic database last reviewed (include red flags): 10/30/21 She has been exercising, performing PT twice a week. She feels her balance is better.    Depression screen [facility] 2/9 10/30/2021 09/16/2021 04/22/2021 02/04/2021 07/22/2020  Decreased Interest 1 0 1 0 0  Down, Depressed, Hopeless 0 1 0 1 0  PHQ - 2 Score 1 1 1 1  0  Altered sleeping 2 - 1 1 -  Tired, decreased energy 2 - 2 1 -  Change in appetite 1 - 2 0 -  Feeling bad or failure about yourself  0 - 0 0 -  Trouble concentrating 0 - 0 1 -  Moving slowly or fidgety/restless 0 - 0 0 -  Suicidal thoughts 0 - 0 0 -  PHQ-9 Score 6 - 6 4 -  Difficult doing work/chores - - - Somewhat difficult -  Some recent data might be hidden   GAD 7 : Generalized Anxiety Score 10/30/2021 04/22/2021 10/10/2019 04/24/2019  Nervous, Anxious, on Edge 0 0 1 2  Control/stop worrying 2 1 0 2  Worry too much - different things 2 3 0 2  Trouble relaxing 2 1 1  0  Restless 0 0 0 0  Easily annoyed or irritable 0 0 0 0  Afraid - awful might happen  1 1 1  0  Total GAD 7 Score 7 6 3 6   Anxiety Difficulty - - Not difficult at all -       Fall Risk  10/26/2021 09/16/2021 02/04/2021 07/22/2020 06/25/2020  Falls in the past year? 1 1 1 1 1   Number falls in past yr: 0 1 0 1 1  Injury with Fall? 0 0 0 0 0  Risk for fall due to : - Impaired vision;Impaired balance/gait;Impaired mobility Impaired balance/gait - History of fall(s)  Follow up - Falls prevention discussed Falls evaluation completed Falls evaluation completed Falls prevention discussed    Immunization History  Administered Date(s) Administered   Influenza Split  07/02/2011   Influenza Whole 07/16/2008, 07/04/2009, 07/14/2010   Influenza, High Dose Seasonal PF 07/04/2018, 06/20/2019   Influenza,inj,Quad PF,6+ Mos 06/29/2013, 07/08/2014, 06/23/2017   Influenza-Unspecified 07/02/2015, 06/22/2019, 04/30/2021   Moderna Sars-Covid-2 Vaccination 12/20/2019, 01/17/2020, 05/23/2020, 04/30/2021   Pneumococcal Conjugate-13 05/15/2014   Pneumococcal Polysaccharide-23 10/04/2006, 09/11/2015   Td 03/19/2010   Tdap 03/10/2020   Zoster Recombinat (Shingrix) 07/07/2019, 09/05/2019   Zoster, Live 07/25/2015    No results found.  Past Medical History:  Diagnosis Date   Anxiety    Arthritis    oa   At moderate risk for fall 02/04/2021   Atypical lobular hyperplasia [facility]) of left breast 12/06/2019   Body mass index (BMI) 45.0-49.9, adult ([place]) 03/26/2021   Cardiac murmur 08/06/2020   Chronic cystitis    CKD (chronic kidney disease) stage 3, GFR 30-59 ml/min (HCC) 04/13/2017   Cystic thyroid nodule 08/09/2018   7x10 mm, right lobe   DDD (degenerative disc disease), cervical    Depression with anxiety 11/04/2007   Current med: xanax PRN, zoloft 100 mg qd.     Dysphagia 07/28/2018   Essential hypertension 11/04/2007   Current meds: lisinopril/HCTZ 10/12.5 mg qd; tenormin 25 mg qd     Fibromyalgia    Gait instability 01/22/2019   GERD (gastroesophageal reflux disease)    Heart murmur    History of duodenal ulcer    2007   History of gastritis    2007   Hoarseness 08/22/2015   GI- did not feel gerd, ENT did not feel ent, neuro- felt possibly ent--> sent for 2nd opinion ent.    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 This patient is considered osteoporotic according to [facility]) criteria. L-1, L-4 were excluded due to degenerative changes. Per the official positions of the ISCD, it is not possible to quantitatively compare BMD or calculate an [facility] between exams done at different facilities.    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 MRI scheduled. 04/16/2016    Seasonal allergies 02/19/2016   Unspecified venous (peripheral)  insufficiency    wears compression hose   Urine incontinence    Vitamin D deficiency 07/20/2008   Taking 1000 iu qd     Vitamin D deficiency, unspecified    Allergies  Allergen Reactions   Meloxicam Swelling    Caused edema and Cr increase   Morphine Nausea And Vomiting    SEVERE   Sulfa Antibiotics Swelling   Past Surgical History:  Procedure Laterality Date   ANTERIOR AND POSTERIOR VAGINAL REPAIR  04/29/2004   AND TRANSVAGINAL TAPE SLING   ANTERIOR CERVICAL DECOMP/DISCECTOMY FUSION  1999   APPENDECTOMY  1984   BILATERAL SALPINGOOPHORECTOMY  1984   Bladder stimulator     BREAST BIOPSY Right 03/2021   BREAST LUMPECTOMY WITH RADIOACTIVE SEED LOCALIZATION Left 10/31/2019   Procedure: LEFT BREAST LUMPECTOMY WITH RADIOACTIVE SEED LOCALIZATION;  Surgeon: Erroll Luna, MD;  Location: [place];  Service: General;  Laterality: Left;   CARDIAC CATHETERIZATION  10-03-2002   DR Dannielle Burn   NORMAL CORONARIES ARTERIES/  EF 55%   CYSTOSCOPY N/A 08/27/2013   Procedure: CYSTOSCOPY;  Surgeon: Ailene Rud, MD;  Location[facility];  Service: Urology;  Laterality: N/A;   CYSTOSCOPY WITH INJECTION N/A 12/17/2013   Procedure: Ascencion Dike WITH INJECTION;  Surgeon: Ailene Rud, MD;  Location: [facility];  Service: Urology;  Laterality: N/A;   DILATION AND CURETTAGE OF UTERUS     EXCISION RIGHT NECK AND LEFT BREAST SEBACEOUS CYST  05/18/2002   HEMORRHOID BANDING  2014   PUBOVAGINAL SLING N/A 10/25/2016   Procedure: Gaynelle Arabian;  Surgeon: Carolan Clines, MD;  Location: [facility];  Service: Urology;  Laterality: N/A;   right litlle finger surgery  yrs ago   cyst removed x 4   TOTAL ABDOMINAL HYSTERECTOMY  1986   TUBAL LIGATION     VAGINAL PROLAPSE REPAIR N/A 08/27/2013   Procedure: ANTERIOR VAGINAL VAULT SUSPENSION, KELLY PLICATION WITH SACROSPINOUS LIGAMENT FIXATION AND XENFORM BOVINE DERMIS GRAFT AUGMENTATION, URETHRAL EXPLORATION, URETHROLYSIS, EXPLANTATION OF TVT TAPE, IMPLANTATION OF FLOSEAL, IMPLANTATION OF XENFORM BOVINE GRAFT IN PERIURETHRAL SPACE;  Surgeon: Ailene Rud, MD;  Location: [place];  Service: Urology;  Laterality: N/A   VAGINAL PROLAPSE REPAIR N/A 10/25/2016   Procedure: VAGINAL VAULT SUSPENSION with mesh and sacrospinous repair;  Surgeon: Carolan Clines, MD;  Location: [facility];  Service: Urology;  Laterality: N/A;   Family History  Problem Relation Age of Onset   Hypertension Mother    Heart disease Mother    Myelodysplastic syndrome Mother    Arthritis Mother    Cervical cancer Mother    Lung cancer Father    Coronary artery disease Father    Fibromyalgia Sister    Hypertension Sister    Anemia Sister    Depression Sister    Pancreatic cancer Sister        died 31-Jan-[id number] mos after dx   Kidney disease Sister        kidney stones, removed kidney   Hypertension Brother    Anemia Brother    Hypertension Brother    Hyperlipidemia Brother    Dementia Maternal Aunt    Dementia Maternal Aunt    Heart disease  Maternal Uncle    Lung cancer Maternal Uncle    Lung cancer Paternal Uncle    Other Paternal Uncle        brain Tumor   Pancreatic cancer Cousin        maternal  Colon polyps Other        aunt   Colon cancer Neg Hx    Social History   Social History Narrative   She is retired and divorced and has 1 child   Occasional alcohol, former smoker no drug use   Right handed    Caffeine 2-3 cups per day   Lives at alone     Allergies as of 10/30/2021       Reactions   Meloxicam Swelling   Caused edema and Cr increase   Morphine Nausea And Vomiting   SEVERE   Sulfa Antibiotics Swelling        Medication List        Accurate as of October 30, 2021  9:58 AM. If you have any questions, ask your nurse or doctor.          STOP taking these medications    fesoterodine 4 MG Tb24 tablet Commonly known as: TOVIAZ Stopped by: Howard Pouch, DO   Fluad Quadrivalent 0.5 ML injection Generic drug: influenza vaccine adjuvanted Stopped by: Howard Pouch, DO   Moderna COVID-19 Bival Booster 50 MCG/0.5ML injection Generic drug: COVID-19 mRNA bivalent vaccine (Moderna) Stopped by: Howard Pouch, DO   Moderna COVID-19 Vaccine 100 MCG/0.5ML injection Generic drug: COVID-19 mRNA vaccine (Moderna) Stopped by: Howard Pouch, DO   Restasis 0.05 % ophthalmic emulsion Generic drug: cycloSPORINE Stopped by: Howard Pouch, DO       TAKE these medications    atenolol 25 MG tablet Commonly known as: TENORMIN Take 0.5 tablets (12.5 mg total) by mouth daily.   calcium carbonate 600 MG Tabs tablet Commonly known as: OS-CAL Take 600 mg by mouth daily.   cetirizine 10 MG tablet Commonly known as: ZYRTEC Take 10 mg by mouth daily as needed for allergies.   cholecalciferol 25 MCG (1000 UNIT) tablet Commonly known as: VITAMIN D3 Take 1,000 Units by mouth daily.   dicyclomine 10 MG capsule Commonly known as: BENTYL TAKE 1 CAPSULE FOUR TIMES DAILY BEFORE MEALS AND AT BEDTIME    DULoxetine 60 MG capsule Commonly known as: CYMBALTA Take 1 capsule (60 mg total) by mouth 2 (two) times daily.   escitalopram 20 MG tablet Commonly known as: LEXAPRO Take 1 tablet (20 mg total) by mouth daily.   FISH OIL PO Take 1 capsule by mouth daily.   fluticasone 50 MCG/ACT nasal spray Commonly known as: FLONASE Place 1 spray into both nostrils as needed for allergies.   LORazepam 0.5 MG tablet Commonly known as: ATIVAN Take 1 tablet (0.5 mg total) by mouth every 12 (twelve) hours as needed for anxiety (for panic attack).   losartan-hydrochlorothiazide 50-12.5 MG tablet Commonly known as: HYZAAR Take 0.5 tablets by mouth daily.   omeprazole 40 MG capsule Commonly known as: PRILOSEC Take 1 capsule (40 mg total) by mouth 2 (two) times daily before a meal. 30 mins before breakfast and supper   pravastatin 40 MG tablet Commonly known as: PRAVACHOL Take 1 tablet (40 mg total) by mouth daily.   tamoxifen 20 MG tablet Commonly known as: NOLVADEX Take 20 mg by mouth daily.   traMADol 50 MG tablet Commonly known as: ULTRAM Take 1 tablet (50 mg total) by mouth daily.   traZODone 100 MG tablet Commonly known as: DESYREL Take 1 tablet (100 mg total) by mouth at bedtime.   Turmeric Curcumin 500 MG Caps Take 1,000 mg by mouth daily.   Vitamin B-12 1000 MCG Subl Place 1 tablet under the tongue daily.  WOMENS MULTIVITAMIN PLUS PO Take 1 tablet by mouth daily.        All past medical history, surgical history, allergies, family history, immunizations andmedications were updated in the EMR today and reviewed under the history and medication portions of their EMR.      ROS: 14 pt review of systems performed and negative (unless mentioned in an HPI)  Objective: BP 128/87    Pulse 93    Temp 98.7 F (37.1 C)    Wt 203 lb (92.1 kg)    SpO2 97%    BMI 33.78 kg/m  Physical Exam Vitals and nursing note reviewed.  Constitutional:      General: She is not in acute  distress.    Appearance: Normal appearance. She is not ill-appearing, toxic-appearing or diaphoretic.  HENT:     Head: Normocephalic and atraumatic.  Eyes:     General: No scleral icterus.       Right eye: No discharge.        Left eye: No discharge.     Extraocular Movements: Extraocular movements intact.     Conjunctiva/sclera: Conjunctivae normal.     Pupils: Pupils are equal, round, and reactive to light.  Cardiovascular:     Rate and Rhythm: Normal rate and regular rhythm.  Pulmonary:     Effort: Pulmonary effort is normal. No respiratory distress.     Breath sounds: Normal breath sounds. No wheezing, rhonchi or rales.  Musculoskeletal:     Cervical back: Neck supple. No tenderness.     Right lower leg: No edema.     Left lower leg: No edema.  Lymphadenopathy:     Cervical: No cervical adenopathy.  Skin:    General: Skin is warm and dry.     Coloration: Skin is not jaundiced or pale.     Findings: No erythema or rash.  Neurological:     Mental Status: She is alert and oriented to person, place, and time. Mental status is at baseline.     Motor: No weakness.     Gait: Gait normal.  Psychiatric:        Mood and Affect: Mood normal.        Behavior: Behavior normal.        Thought Content: Thought content normal.        Judgment: Judgment normal.   Assessment/plan: [name] [name] is a 76 y.o. female present for [facility] Depression with anxiety/insomnia/panic d/o/benzo use Stable Continue  Lexapro 20 mg daily- refills provided today continue  Cymbalta 60 mg twice a day. Continue  trazodone 100 mg daily at bedtime. - continue  Ativan provided for 6 months. NCCS database reviewed and appropriate 10/30/21 - UDS - UTD - Controlled substance contract signed. - pt declined psychology referral.    Hypertension/morbid obesity/hyperlipidemia/CKD3/palpitations: - stable.  Continue  losartan-HCTZ (1/2 tab) - continue atenolol to 12.5 mg daily. If palpitations worsen, can return to  atenolol 25 mg daily.   - continue statin - recent labs at cardiology reviewed.  - diet and exercise modifications recommended.   Vitamin D deficiency/osteoporosis:  Continue  vit d supplement reclast - last tx d/t to 5 yr >01/2021  Arthritis/pain/lumbar pain: Very helpful.  Continue tramadol for chronic arthritic pain. Encouraged her to take tramadol every 12 hours daily (was only routinely taking once).  - UDS UTD - contract up-to-date. - NCCS database reviewed 10/30/21 and appropriate.  - Follow-up in 5.5 months if needing prescription refilled.   Atypical lobular hyperplasia ([facility])  of left breast Following with surgical team  Pernicious anemia Continue B12  Other irritable bowel syndrome Stable Continue bentyl   Return in about 24 weeks (around 04/16/2022) for [facility] (30 min).  Orders Placed This Encounter  Procedures   Basic Metabolic Panel (BMET)   CBC   Vitamin D (25 hydroxy)   B12    Meds ordered this encounter  Medications   atenolol (TENORMIN) 25 MG tablet    Sig: Take 0.5 tablets (12.5 mg total) by mouth daily.    Dispense:  45 tablet    Refill:  1   dicyclomine (BENTYL) 10 MG capsule    Sig: TAKE 1 CAPSULE FOUR TIMES DAILY BEFORE MEALS AND AT BEDTIME    Dispense:  120 capsule    Refill:  1   DULoxetine (CYMBALTA) 60 MG capsule    Sig: Take 1 capsule (60 mg total) by mouth 2 (two) times daily.    Dispense:  180 capsule    Refill:  1   escitalopram (LEXAPRO) 20 MG tablet    Sig: Take 1 tablet (20 mg total) by mouth daily.    Dispense:  90 tablet    Refill:  1   losartan-hydrochlorothiazide (HYZAAR) 50-12.5 MG tablet    Sig: Take 0.5 tablets by mouth daily.    Dispense:  45 tablet    Refill:  1    DC prior scripts for this medication. Thanks.   omeprazole (PRILOSEC) 40 MG capsule    Sig: Take 1 capsule (40 mg total) by mouth 2 (two) times daily before a meal. 30 mins before breakfast and supper    Dispense:  180 capsule    Refill:  3   pravastatin  (PRAVACHOL) 40 MG tablet    Sig: Take 1 tablet (40 mg total) by mouth daily.    Dispense:  90 tablet    Refill:  3   traZODone (DESYREL) 100 MG tablet    Sig: Take 1 tablet (100 mg total) by mouth at bedtime.    Dispense:  90 tablet    Refill:  1   LORazepam (ATIVAN) 0.5 MG tablet    Sig: Take 1 tablet (0.5 mg total) by mouth every 12 (twelve) hours as needed for anxiety (for panic attack).    Dispense:  180 tablet    Refill:  1   traMADol (ULTRAM) 50 MG tablet    Sig: Take 1 tablet (50 mg total) by mouth daily.    Dispense:  180 tablet    Refill:  1    Referral Orders  No referral(s) requested today     Note is dictated utilizing voice recognition software. Although note has been proof read prior to signing, occasional typographical errors still can be missed. If any questions arise, please do not hesitate to call for verification.  Electronically signed by: Howard Pouch, DO [place]

## 2021-11-05 ENCOUNTER — Other Ambulatory Visit: Payer: Self-pay

## 2021-11-06 ENCOUNTER — Encounter: Payer: Medicare HMO | Admitting: Family Medicine

## 2021-11-16 ENCOUNTER — Other Ambulatory Visit: Payer: Self-pay

## 2021-11-18 ENCOUNTER — Other Ambulatory Visit: Payer: Self-pay

## 2021-11-18 ENCOUNTER — Encounter: Payer: Self-pay | Admitting: Family Medicine

## 2021-11-18 ENCOUNTER — Ambulatory Visit (INDEPENDENT_AMBULATORY_CARE_PROVIDER_SITE_OTHER): Payer: Medicare HMO | Admitting: Family Medicine

## 2021-11-18 VITALS — BP 131/85 | HR 100 | Temp 98.1°F | Ht 65.5 in | Wt 202.0 lb

## 2021-11-18 DIAGNOSIS — E781 Pure hyperglyceridemia: Secondary | ICD-10-CM

## 2021-11-18 DIAGNOSIS — M810 Age-related osteoporosis without current pathological fracture: Secondary | ICD-10-CM

## 2021-11-18 DIAGNOSIS — Z0000 Encounter for general adult medical examination without abnormal findings: Secondary | ICD-10-CM

## 2021-11-18 DIAGNOSIS — N1832 Chronic kidney disease, stage 3b: Secondary | ICD-10-CM

## 2021-11-18 DIAGNOSIS — Z1231 Encounter for screening mammogram for malignant neoplasm of breast: Secondary | ICD-10-CM | POA: Diagnosis not present

## 2021-11-18 DIAGNOSIS — Z800 Family history of malignant neoplasm of digestive organs: Secondary | ICD-10-CM

## 2021-11-18 DIAGNOSIS — I700 Atherosclerosis of aorta: Secondary | ICD-10-CM

## 2021-11-18 LAB — TSH: TSH: 1.82 u[IU]/mL (ref 0.35–5.50)

## 2021-11-18 LAB — HEMOGLOBIN A1C: Hgb A1c MFr Bld: 5.5 % (ref 4.6–6.5)

## 2021-11-18 NOTE — Patient Instructions (Signed)
Great to see you today.  I have refilled the medication(s) we provide.   If labs were collected, we will inform you of lab results once received either by echart message or telephone call.   - echart message- for normal results that have been seen by the patient already.   - telephone call: abnormal results or if patient has not viewed results in their echart.  Health Maintenance After Age 75 After age 58, you are at a higher risk for certain long-term diseases and infections as well as injuries from falls. Falls are a major cause of broken bones and head injuries in people who are older than age 76. Getting regular preventive care can help to keep you healthy and well. Preventive care includes getting regular testing and making lifestyle changes as recommended by your health care provider. Talk with your health care provider about: Which screenings and tests you should have. A screening is a test that checks for a disease when you have no symptoms. A diet and exercise plan that is right for you. What should I know about screenings and tests to prevent falls? Screening and testing are the best ways to find a health problem early. Early diagnosis and treatment give you the best chance of managing medical conditions that are common after age 55. Certain conditions and lifestyle choices may make you more likely to have a fall. Your health care provider may recommend: Regular vision checks. Poor vision and conditions such as cataracts can make you more likely to have a fall. If you wear glasses, make sure to get your prescription updated if your vision changes. Medicine review. Work with your health care provider to regularly review all of the medicines you are taking, including over-the-counter medicines. Ask your health care provider about any side effects that may make you more likely to have a fall. Tell your health care provider if any medicines that you take make you feel dizzy or  sleepy. Strength and balance checks. Your health care provider may recommend certain tests to check your strength and balance while standing, walking, or changing positions. Foot health exam. Foot pain and numbness, as well as not wearing proper footwear, can make you more likely to have a fall. Screenings, including: Osteoporosis screening. Osteoporosis is a condition that causes the bones to get weaker and break more easily. Blood pressure screening. Blood pressure changes and medicines to control blood pressure can make you feel dizzy. Depression screening. You may be more likely to have a fall if you have a fear of falling, feel depressed, or feel unable to do activities that you used to do. Alcohol use screening. Using too much alcohol can affect your balance and may make you more likely to have a fall. Follow these instructions at home: Lifestyle Do not drink alcohol if: Your health care provider tells you not to drink. If you drink alcohol: Limit how much you have to: 0-1 drink a day for women. 0-2 drinks a day for men. Know how much alcohol is in your drink. In the [place] one drink equals one 12 oz bottle of beer (355 mL), one 5 oz glass of wine (148 mL), or one 1 oz glass of hard liquor (44 mL). Do not use any products that contain nicotine or tobacco. These products include cigarettes, chewing tobacco, and vaping devices, such as e-cigarettes. If you need help quitting, ask your health care provider. Activity  Follow a regular exercise program to stay fit. This will help  you maintain your balance. Ask your health care provider what types of exercise are appropriate for you. If you need a cane or walker, use it as recommended by your health care provider. Wear supportive shoes that have nonskid soles. Safety  Remove any tripping hazards, such as rugs, cords, and clutter. Install safety equipment such as grab bars in bathrooms and safety rails on stairs. Keep rooms and walkways  well-lit. General instructions Talk with your health care provider about your risks for falling. Tell your health care provider if: You fall. Be sure to tell your health care provider about all falls, even ones that seem minor. You feel dizzy, tiredness (fatigue), or off-balance. Take over-the-counter and prescription medicines only as told by your health care provider. These include supplements. Eat a healthy diet and maintain a healthy weight. A healthy diet includes low-fat dairy products, low-fat (lean) meats, and fiber from whole grains, beans, and lots of fruits and vegetables. Stay current with your vaccines. Schedule regular health, dental, and eye exams. Summary Having a healthy lifestyle and getting preventive care can help to protect your health and wellness after age 56. Screening and testing are the best way to find a health problem early and help you avoid having a fall. Early diagnosis and treatment give you the best chance for managing medical conditions that are more common for people who are older than age 68. Falls are a major cause of broken bones and head injuries in people who are older than age 49. Take precautions to prevent a fall at home. Work with your health care provider to learn what changes you can make to improve your health and wellness and to prevent falls. This information is not intended to replace advice given to you by your health care provider. Make sure you discuss any questions you have with your health care provider. Document Revised: 02/09/2021 Document Reviewed: 02/09/2021 Elsevier Patient Education  [place].

## 2021-11-18 NOTE — Progress Notes (Signed)
[place]  This visit occurred during the SARS-CoV-2 public health emergency.  Safety protocols were in place, including screening questions prior to the visit, additional usage of staff PPE, and extensive cleaning of exam room while observing appropriate contact time as indicated for disinfecting solutions.    Patient ID: [name] [name], female  DOB: 11-20-1945, 76 y.o.   MRN: [id number] Patient Care Team    Relationship Specialty Notifications Start End  Ma Hillock, DO PCP - General Family Medicine  06/02/15   Gatha Mayer, MD Consulting Physician Gastroenterology  11/24/15   Calvert Cantor, MD Consulting Physician Ophthalmology  11/24/15   Specialists, [facility]  Orthopedic Surgery  04/12/16   Pa, [facility]    01/16/18   Rheumatology, [facility]    01/16/18   Kathrynn Ducking, MD (Inactive) Consulting Physician Neurology  10/24/18     Chief Complaint  Patient presents with   Annual Exam    Pt is fasting    Subjective: [name] [name] is a 76 y.o.  Female  present for CPE. All past medical history, surgical history, allergies, family history, immunizations, medications and social history were updated in the electronic medical record today. All recent labs, ED visits and hospitalizations within the last year were reviewed.  Health maintenance:  Mammogram (50-74): [place]- 02/03/2021- with [place]. She has an appt  w/ Dr. Brantley Stage end of this month.  "Pathology revealed FIBROCYSTIC CHANGES WITH CALCIFICATIONS of the Right breast, inner. This was found to be concordant by Dr. Lovey Newcomer.  The patient was instructed to return for annual screening mammography, due in May 2023." Order placed Immunizations: Influenza vaccination up-to-date 2022, tetanus vaccination up-to-date 2021, pneumonia vaccinations completed.  Shingrix completed  Infectious disease screening: hep c completed DEXA: rpt due 08/2021- osteoporosis. Reclast last one this April 2022> scheduled for  12/08/2021 Assistive device: none Oxygen IPJ:ASNK Patient has a Dental home. Hospitalizations/ED visits: reviewed  Colonoscopy: no further screenings recommended by GI- 03/2016- Dr. Carlean Purl.  PAP > 65. No longer indicated.   Depression screen [facility] 2/9 11/18/2021 10/30/2021 09/16/2021 04/22/2021 02/04/2021  Decreased Interest 2 1 0 1 0  Down, Depressed, Hopeless 1 0 1 0 1  PHQ - 2 Score 3 1 1 1 1   Altered sleeping 3 2 - 1 1  Tired, decreased energy 3 2 - 2 1  Change in appetite 0 1 - 2 0  Feeling bad or failure about yourself  0 0 - 0 0  Trouble concentrating 0 0 - 0 1  Moving slowly or fidgety/restless 0 0 - 0 0  Suicidal thoughts 0 0 - 0 0  PHQ-9 Score 9 6 - 6 4  Difficult doing work/chores - - - - Somewhat difficult  Some recent data might be hidden   GAD 7 : Generalized Anxiety Score 11/18/2021 10/30/2021 04/22/2021 10/10/2019  Nervous, Anxious, on Edge 2 0 0 1  Control/stop worrying 0 2 1 0  Worry too much - different things 2 2 3  0  Trouble relaxing 2 2 1 1   Restless 0 0 0 0  Easily annoyed or irritable 0 0 0 0  Afraid - awful might happen 1 1 1 1   Total GAD 7 Score 7 7 6 3   Anxiety Difficulty - - - Not difficult at all           Immunization History  Administered Date(s) Administered   Influenza Split 07/02/2011   Influenza Whole 07/16/2008, 07/04/2009, 07/14/2010   Influenza, High Dose  Seasonal PF 07/04/2018, 06/20/2019   Influenza,inj,Quad PF,6+ Mos 06/29/2013, 07/08/2014, 06/23/2017   Influenza-Unspecified 07/02/2015, 06/22/2019, 04/30/2021   Moderna Sars-Covid-2 Vaccination 12/20/2019, 01/17/2020, 05/23/2020, 04/30/2021   Pneumococcal Conjugate-13 05/15/2014   Pneumococcal Polysaccharide-23 10/04/2006, 09/11/2015   Td 03/19/2010   Tdap 03/10/2020   Zoster Recombinat (Shingrix) 07/07/2019, 09/05/2019   Zoster, Live 07/25/2015     Past Medical History:  Diagnosis Date   Anxiety    Arthritis    oa   At moderate risk for fall 02/04/2021   Atypical lobular  hyperplasia [facility]) of left breast 12/06/2019   Body mass index (BMI) 45.0-49.9, adult (HCC) 03/26/2021   Cardiac murmur 08/06/2020   Chronic cystitis    CKD (chronic kidney disease) stage 3, GFR 30-59 ml/min (HCC) 04/13/2017   Cystic thyroid nodule 08/09/2018   7x10 mm, right lobe   DDD (degenerative disc disease), cervical    Depression with anxiety 11/04/2007   Current med: xanax PRN, zoloft 100 mg qd.     Dysphagia 07/28/2018   Essential hypertension 11/04/2007   Current meds: lisinopril/HCTZ 10/12.5 mg qd; tenormin 25 mg qd     Fibromyalgia    Gait instability 01/22/2019   GERD (gastroesophageal reflux disease)    Heart murmur    History of duodenal ulcer    2007   History of gastritis    2007   Hoarseness 08/22/2015   GI- did not feel gerd, ENT did not feel ent, neuro- felt possibly ent--> sent for 2nd opinion ent.    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 This patient is considered osteoporotic according to [facility]) criteria. L-1, L-4 were excluded due to degenerative changes. Per the official positions of the ISCD, it is not possible to quantitatively compare BMD or calculate an [facility] between exams done at different facilities.    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 MRI scheduled. 04/16/2016    Seasonal allergies 02/19/2016   Unspecified venous (peripheral) insufficiency    wears compression hose   Urine incontinence    Vitamin D deficiency 07/20/2008   Taking 1000 iu qd     Vitamin D deficiency, unspecified    Allergies  Allergen Reactions   Meloxicam Swelling    Caused edema and Cr increase   Morphine Nausea And Vomiting    SEVERE   Sulfa Antibiotics Swelling   Past Surgical History:  Procedure Laterality Date   ANTERIOR AND POSTERIOR VAGINAL REPAIR  04/29/2004   AND TRANSVAGINAL TAPE SLING   ANTERIOR CERVICAL DECOMP/DISCECTOMY FUSION  1999   APPENDECTOMY  1984   BILATERAL SALPINGOOPHORECTOMY  1984   Bladder stimulator     BREAST BIOPSY Right 03/2021   BREAST LUMPECTOMY WITH RADIOACTIVE SEED LOCALIZATION Left 10/31/2019  Procedure: LEFT BREAST LUMPECTOMY WITH RADIOACTIVE SEED LOCALIZATION;  Surgeon: Erroll Luna, MD;  Location: [place];  Service: General;  Laterality: Left;   CARDIAC CATHETERIZATION  10-03-2002   DR Dannielle Burn   NORMAL CORONARIES ARTERIES/  EF 55%   CYSTOSCOPY N/A 08/27/2013   Procedure: CYSTOSCOPY;  Surgeon: Ailene Rud, MD;  Location[facility];  Service: Urology;  Laterality: N/A;   CYSTOSCOPY WITH INJECTION N/A 12/17/2013   Procedure: Ascencion Dike WITH INJECTION;  Surgeon: Ailene Rud, MD;  Location: [facility];  Service: Urology;  Laterality: N/A;   DILATION AND CURETTAGE OF UTERUS     EXCISION RIGHT NECK AND LEFT BREAST SEBACEOUS CYST  05/18/2002   HEMORRHOID BANDING  2014   PUBOVAGINAL SLING N/A 10/25/2016   Procedure: Gaynelle Arabian;  Surgeon: Carolan Clines, MD;  Location: [facility];  Service: Urology;  Laterality: N/A;   right litlle finger surgery  yrs ago   cyst removed x 4   TOTAL ABDOMINAL HYSTERECTOMY  1986   TUBAL LIGATION     VAGINAL PROLAPSE REPAIR N/A  08/27/2013   Procedure: ANTERIOR VAGINAL VAULT SUSPENSION, KELLY PLICATION WITH SACROSPINOUS LIGAMENT FIXATION AND XENFORM BOVINE DERMIS GRAFT AUGMENTATION, URETHRAL EXPLORATION, URETHROLYSIS, EXPLANTATION OF TVT TAPE, IMPLANTATION OF FLOSEAL, IMPLANTATION OF XENFORM BOVINE GRAFT IN PERIURETHRAL SPACE;  Surgeon: Ailene Rud, MD;  Location: [place];  Service: Urology;  Laterality: N/A   VAGINAL PROLAPSE REPAIR N/A 10/25/2016   Procedure: VAGINAL VAULT SUSPENSION with mesh and sacrospinous repair;  Surgeon: Carolan Clines, MD;  Location: [facility];  Service: Urology;  Laterality: N/A;   Family History  Problem Relation Age of Onset   Hypertension Mother    Heart disease Mother    Myelodysplastic syndrome Mother    Arthritis Mother    Cervical cancer Mother    Lung cancer Father    Coronary artery disease Father    Fibromyalgia Sister    Hypertension Sister    Anemia Sister    Depression Sister    Pancreatic cancer Sister        died 02/14/202358 mos after dx   Kidney disease Sister        kidney stones, removed kidney   Hypertension Brother    Anemia Brother    Hypertension Brother    Hyperlipidemia Brother    Dementia Maternal Aunt    Dementia Maternal Aunt    Heart disease Maternal Uncle    Lung cancer Maternal Uncle    Lung cancer Paternal Uncle    Other Paternal Uncle        brain Tumor   Pancreatic cancer Cousin        maternal   Colon polyps Other        aunt   Colon cancer Neg Hx    Social History   Social History Narrative   She is retired and divorced and has 1 child   Occasional alcohol, former smoker no drug use   Right handed    Caffeine 2-3 cups per day   Lives at alone     Allergies as of 11/18/2021       Reactions   Meloxicam Swelling   Caused edema and Cr increase   Morphine Nausea And Vomiting   SEVERE   Sulfa Antibiotics Swelling        Medication List        Accurate as of November 18, 2021 11:59 PM. If you  have any questions,  ask your nurse or doctor.          atenolol 25 MG tablet Commonly known as: TENORMIN Take 0.5 tablets (12.5 mg total) by mouth daily.   calcium carbonate 600 MG Tabs tablet Commonly known as: OS-CAL Take 600 mg by mouth daily.   cetirizine 10 MG tablet Commonly known as: ZYRTEC Take 10 mg by mouth daily as needed for allergies.   cholecalciferol 25 MCG (1000 UNIT) tablet Commonly known as: VITAMIN D3 Take 1,000 Units by mouth daily.   dicyclomine 10 MG capsule Commonly known as: BENTYL TAKE 1 CAPSULE FOUR TIMES DAILY BEFORE MEALS AND AT BEDTIME   DULoxetine 60 MG capsule Commonly known as: CYMBALTA Take 1 capsule (60 mg total) by mouth 2 (two) times daily.   escitalopram 20 MG tablet Commonly known as: LEXAPRO Take 1 tablet (20 mg total) by mouth daily.   fesoterodine 8 MG Tb24 tablet Commonly known as: TOVIAZ   FISH OIL PO Take 1 capsule by mouth daily.   fluticasone 50 MCG/ACT nasal spray Commonly known as: FLONASE Place 1 spray into both nostrils as needed for allergies.   LORazepam 0.5 MG tablet Commonly known as: ATIVAN Take 1 tablet (0.5 mg total) by mouth every 12 (twelve) hours as needed for anxiety (for panic attack).   losartan-hydrochlorothiazide 50-12.5 MG tablet Commonly known as: HYZAAR Take 0.5 tablets by mouth daily.   omeprazole 40 MG capsule Commonly known as: PRILOSEC Take 1 capsule (40 mg total) by mouth 2 (two) times daily before a meal. 30 mins before breakfast and supper   pravastatin 40 MG tablet Commonly known as: PRAVACHOL Take 1 tablet (40 mg total) by mouth daily.   tamoxifen 20 MG tablet Commonly known as: NOLVADEX Take 20 mg by mouth daily.   traMADol 50 MG tablet Commonly known as: ULTRAM Take 1 tablet (50 mg total) by mouth daily.   traZODone 100 MG tablet Commonly known as: DESYREL Take 1 tablet (100 mg total) by mouth at bedtime.   Turmeric Curcumin 500 MG Caps Take 1,000 mg by mouth  daily.   Vitamin B-12 1000 MCG Subl Place 1 tablet under the tongue daily.   WOMENS MULTIVITAMIN PLUS PO Take 1 tablet by mouth daily.        All past medical history, surgical history, allergies, family history, immunizations andmedications were updated in the EMR today and reviewed under the history and medication portions of their EMR.     Recent Results (from the past 2160 hour(s))  Basic Metabolic Panel (BMET)     Status: Abnormal   Collection Time: 10/30/21  9:56 AM  Result Value Ref Range   Sodium 142 135 - 145 mEq/L   Potassium 4.0 3.5 - 5.1 mEq/L   Chloride 102 96 - 112 mEq/L   CO2 33 (H) 19 - 32 mEq/L   Glucose, Bld 84 70 - 99 mg/dL   BUN 19 6 - 23 mg/dL   Creatinine, Ser 1.22 (H) 0.40 - 1.20 mg/dL   GFR 43.68 (L) >60.00 mL/min    Comment: Calculated using the CKD-EPI Creatinine Equation (2021)   Calcium 10.0 8.4 - 10.5 mg/dL  CBC     Status: Abnormal   Collection Time: 10/30/21  9:56 AM  Result Value Ref Range   WBC 7.2 4.0 - 10.5 K/uL   RBC 4.17 3.87 - 5.11 Mil/uL   Platelets 273.0 150.0 - 400.0 K/uL   Hemoglobin 11.6 (L) 12.0 - 15.0 g/dL   HCT 36.5 36.0 - 46.0 %  MCV 87.5 78.0 - 100.0 fl   MCHC 31.8 30.0 - 36.0 g/dL   RDW 14.2 11.5 - 15.5 %  Vitamin D (25 hydroxy)     Status: None   Collection Time: 10/30/21  9:56 AM  Result Value Ref Range   VITD 47.14 30.00 - 100.00 ng/mL  B12     Status: None   Collection Time: 10/30/21  9:56 AM  Result Value Ref Range   Vitamin B-12 285 211 - 911 pg/mL  Hemoglobin A1c     Status: None   Collection Time: 11/18/21 10:13 AM  Result Value Ref Range   Hgb A1c MFr Bld 5.5 4.6 - 6.5 %    Comment: Glycemic Control Guidelines for People with Diabetes:Non Diabetic:  <6%Goal of Therapy: <7%Additional Action Suggested:  >8%   Lipid panel     Status: Abnormal   Collection Time: 11/18/21 10:13 AM  Result Value Ref Range   Cholesterol 191 0 - 200 mg/dL    Comment: ATP III Classification       Desirable:  < 200 mg/dL                Borderline High:  200 - 239 mg/dL          High:  > = 240 mg/dL   Triglycerides 164.0 (H) 0.0 - 149.0 mg/dL    Comment: Normal:  <150 mg/dLBorderline High:  150 - 199 mg/dL   HDL 56.10 >39.00 mg/dL   VLDL 32.8 0.0 - 40.0 mg/dL   LDL Cholesterol 102 (H) 0 - 99 mg/dL   Total CHOL/HDL Ratio 3     Comment:                Men          Women1/2 Average Risk     3.4          3.3Average Risk          5.0          4.42X Average Risk          9.6          7.13X Average Risk          15.0          11.0                       NonHDL 134.94     Comment: NOTE:  Non-HDL goal should be 30 mg/dL higher than patient's LDL goal (i.e. LDL goal of < 70 mg/dL, would have non-HDL goal of < 100 mg/dL)  TSH     Status: None   Collection Time: 11/18/21 10:13 AM  Result Value Ref Range   TSH 1.82 0.35 - 5.50 uIU/mL    MM CLIP PLACEMENT RIGHT  Result Date: 03/26/2021 CLINICAL DATA:  Patient status post stereotactic guided core needle biopsy right breast calcifications. EXAM: DIAGNOSTIC RIGHT MAMMOGRAM POST STEREOTACTIC BIOPSY COMPARISON:  Previous exam(s). FINDINGS: Mammographic images were obtained following stereotactic guided biopsy of right breast calcifications. The biopsy marking clip is in expected position at the site of biopsy. IMPRESSION: Appropriate positioning of the X shaped biopsy marking clip at the site of biopsy in the upper inner right breast. Final Assessment: Post Procedure Mammograms for Marker Placement Electronically Signed   By: Lovey Newcomer M.D.   On: 03/26/2021 11:53  MM RT BREAST BX W LOC DEV 1ST LESION IMAGE BX SPEC STEREO GUIDE  Addendum Date: 03/30/2021   ADDENDUM  REPORT: 03/27/2021 13:25 ADDENDUM: Pathology revealed FIBROCYSTIC CHANGES WITH CALCIFICATIONS of the Right breast, inner. This was found to be concordant by Dr. Lovey Newcomer. Pathology results were discussed with the patient by telephone. The patient reported doing well after the biopsy with tenderness at the site. Post biopsy  instructions and care were reviewed and questions were answered. The patient was encouraged to call [facility] for any additional concerns. My direct phone number was provided. The patient was instructed to return for annual screening mammography, due in May 2023. Pathology results reported by Terie Purser, RN on 03/27/2021. Electronically Signed   By: Lovey Newcomer M.D.   On: 03/27/2021 13:25   Result Date: 03/30/2021 CLINICAL DATA:  Patient with indeterminate right breast calcifications. EXAM: RIGHT BREAST STEREOTACTIC CORE NEEDLE BIOPSY COMPARISON:  Previous exams. FINDINGS: The patient and I discussed the procedure of stereotactic-guided biopsy including benefits and alternatives. We discussed the high likelihood of a successful procedure. We discussed the risks of the procedure including infection, bleeding, tissue injury, clip migration, and inadequate sampling. Informed written consent was given. The usual time out protocol was performed immediately prior to the procedure. Using sterile technique and 1% Lidocaine as local anesthetic, under stereotactic guidance, a 9 gauge vacuum assisted device was used to perform core needle biopsy of calcifications within the inner right breast using a cranial approach. Specimen radiograph was performed showing calcification. Specimens with calcifications are identified for pathology. Lesion quadrant: Upper inner quadrant At the conclusion of the procedure, X shaped tissue marker clip was deployed into the biopsy cavity. Follow-up 2-view mammogram was performed and dictated separately. IMPRESSION: Stereotactic-guided biopsy of right breast calcifications. No apparent complications. Electronically Signed: By: Lovey Newcomer M.D. On: 03/26/2021 11:51  ROS 14 pt review of systems performed and negative (unless mentioned in an HPI)  Objective:  BP 131/85    Pulse 100    Temp 98.1 F (36.7 C) (Oral)    Ht 5' 5.5" (1.664 m)    Wt 202 lb (91.6 kg)     SpO2 97%    BMI 33.10 kg/m  Physical Exam Vitals and nursing note reviewed.  Constitutional:      General: She is not in acute distress.    Appearance: Normal appearance. She is not ill-appearing or toxic-appearing.  HENT:     Head: Normocephalic and atraumatic.     Right Ear: Tympanic membrane, ear canal and external ear normal. There is no impacted cerumen.     Left Ear: Tympanic membrane, ear canal and external ear normal. There is no impacted cerumen.     Nose: No congestion or rhinorrhea.     Mouth/Throat:     Mouth: Mucous membranes are moist.     Pharynx: Oropharynx is clear. No oropharyngeal exudate or posterior oropharyngeal erythema.  Eyes:     General: No scleral icterus.       Right eye: No discharge.        Left eye: No discharge.     Extraocular Movements: Extraocular movements intact.     Conjunctiva/sclera: Conjunctivae normal.     Pupils: Pupils are equal, round, and reactive to light.  Cardiovascular:     Rate and Rhythm: Normal rate and regular rhythm.     Pulses: Normal pulses.     Heart sounds: Normal heart sounds. No murmur heard.   No friction rub. No gallop.  Pulmonary:     Effort: Pulmonary effort is normal. No respiratory distress.  Breath sounds: Normal breath sounds. No stridor. No wheezing, rhonchi or rales.  Chest:     Chest wall: No tenderness.  Abdominal:     General: Abdomen is flat. Bowel sounds are normal. There is no distension.     Palpations: Abdomen is soft. There is no mass.     Tenderness: There is no abdominal tenderness. There is no right CVA tenderness, left CVA tenderness, guarding or rebound.     Hernia: No hernia is present.  Musculoskeletal:        General: No swelling, tenderness or deformity. Normal range of motion.     Cervical back: Normal range of motion and neck supple. No rigidity or tenderness.     Right lower leg: No edema.     Left lower leg: No edema.  Lymphadenopathy:     Cervical: No cervical adenopathy.   Skin:    General: Skin is warm and dry.     Coloration: Skin is not jaundiced or pale.     Findings: No bruising, erythema, lesion or rash.  Neurological:     General: No focal deficit present.     Mental Status: She is alert and oriented to person, place, and time. Mental status is at baseline.     Cranial Nerves: No cranial nerve deficit.     Sensory: No sensory deficit.     Motor: No weakness.     Coordination: Coordination normal.     Gait: Gait normal.     Deep Tendon Reflexes: Reflexes normal.  Psychiatric:        Mood and Affect: Mood normal.        Behavior: Behavior normal.        Thought Content: Thought content normal.        Judgment: Judgment normal.     No results found.  Assessment/plan: [name] [name] is a 76 y.o. female present for CPE  Osteoporosis without current pathological fracture, unspecified osteoporosis type Screening up-to-date Morbid obesity ([place]) - Hemoglobin A1c Hypertriglyceridemia/aortic atherosclerosis - Hemoglobin A1c - Lipid panel - TSH Breast cancer screening by mammogram - MM 3D SCREEN BREAST BILATERAL; Future FH: pancreatic cancer -Discussed with her first-degree relatives with pancreatic cancer and her history, I am hoping to be able to get a screening image of her pancreas covered by insurance.  We will look into this further and order if able Routine General preventative exam Mammogram (50-74): [place]- 02/03/2021- with [place]. She has an appt  w/ Dr. Brantley Stage end of this month.  "Pathology revealed FIBROCYSTIC CHANGES WITH CALCIFICATIONS of the Right breast, inner. This was found to be concordant by Dr. Lovey Newcomer.  The patient was instructed to return for annual screening mammography, due in May 2023." Order placed Immunizations: Influenza vaccination up-to-date 2022, tetanus vaccination up-to-date 2021, pneumonia vaccinations completed.  Shingrix completed  Infectious disease screening: hep c completed DEXA: rpt due 08/2021-  osteoporosis. Reclast last one this April 2022> scheduled for 12/08/2021 Patient was encouraged to exercise greater than 150 minutes a week. Patient was encouraged to choose a diet filled with fresh fruits and vegetables, and lean meats. AVS provided to patient today for education/recommendation on gender specific health and safety maintenance.  Return in about 1 year (around 11/19/2022) for CPE (30 min).  Orders Placed This Encounter  Procedures   MM 3D SCREEN BREAST BILATERAL   Hemoglobin A1c   Lipid panel   TSH    No orders of the defined types were placed in this encounter.  Referral  Orders  No referral(s) requested today     Electronically signed by: Howard Pouch, [place]

## 2021-11-19 LAB — LIPID PANEL
Cholesterol: 191 mg/dL (ref 0–200)
HDL: 56.1 mg/dL (ref >39.00)
LDL Cholesterol: 102 mg/dL — ABNORMAL HIGH (ref 0–99)
NonHDL: 134.94
Total CHOL/HDL Ratio: 3
Triglycerides: 164 mg/dL — ABNORMAL HIGH (ref 0.0–149.0)
VLDL: 32.8 mg/dL (ref 0.0–40.0)

## 2021-11-30 DIAGNOSIS — Z1239 Encounter for other screening for malignant neoplasm of breast: Secondary | ICD-10-CM | POA: Diagnosis not present

## 2021-12-03 ENCOUNTER — Other Ambulatory Visit: Payer: Self-pay | Admitting: Surgery

## 2021-12-03 DIAGNOSIS — Z1239 Encounter for other screening for malignant neoplasm of breast: Secondary | ICD-10-CM

## 2021-12-08 ENCOUNTER — Other Ambulatory Visit: Payer: Medicare HMO

## 2021-12-15 NOTE — Progress Notes (Signed)
This encounter was created in error - please disregard.

## 2021-12-24 ENCOUNTER — Other Ambulatory Visit: Payer: Self-pay | Admitting: Family Medicine

## 2021-12-26 ENCOUNTER — Other Ambulatory Visit: Payer: Medicare HMO

## 2022-01-26 ENCOUNTER — Ambulatory Visit
Admission: RE | Admit: 2022-01-26 | Discharge: 2022-01-26 | Disposition: A | Discharge status: Home / Self Care | Payer: Medicare HMO | Source: Ambulatory Visit | Attending: Surgery | Admitting: Surgery

## 2022-01-26 DIAGNOSIS — Z1239 Encounter for other screening for malignant neoplasm of breast: Secondary | ICD-10-CM

## 2022-01-28 ENCOUNTER — Other Ambulatory Visit: Payer: Self-pay | Admitting: Family Medicine

## 2022-02-16 DIAGNOSIS — H43813 Vitreous degeneration, bilateral: Secondary | ICD-10-CM | POA: Diagnosis not present

## 2022-02-16 DIAGNOSIS — H26491 Other secondary cataract, right eye: Secondary | ICD-10-CM | POA: Diagnosis not present

## 2022-02-16 DIAGNOSIS — H04123 Dry eye syndrome of bilateral lacrimal glands: Secondary | ICD-10-CM | POA: Diagnosis not present

## 2022-02-16 DIAGNOSIS — H35342 Macular cyst, hole, or pseudohole, left eye: Secondary | ICD-10-CM | POA: Diagnosis not present

## 2022-02-16 DIAGNOSIS — H40013 Open angle with borderline findings, low risk, bilateral: Secondary | ICD-10-CM | POA: Diagnosis not present

## 2022-02-18 ENCOUNTER — Ambulatory Visit: Admission: RE | Admit: 2022-02-18 | Payer: Medicare HMO | Source: Ambulatory Visit

## 2022-02-19 ENCOUNTER — Ambulatory Visit
Admission: RE | Admit: 2022-02-19 | Discharge: 2022-02-19 | Disposition: A | Discharge status: Home / Self Care | Payer: Medicare HMO | Source: Ambulatory Visit | Attending: Surgery | Admitting: Surgery

## 2022-02-19 DIAGNOSIS — N641 Fat necrosis of breast: Secondary | ICD-10-CM | POA: Diagnosis not present

## 2022-02-19 DIAGNOSIS — Z853 Personal history of malignant neoplasm of breast: Secondary | ICD-10-CM | POA: Diagnosis not present

## 2022-02-19 DIAGNOSIS — Z9889 Other specified postprocedural states: Secondary | ICD-10-CM | POA: Diagnosis not present

## 2022-02-19 DIAGNOSIS — R928 Other abnormal and inconclusive findings on diagnostic imaging of breast: Secondary | ICD-10-CM | POA: Diagnosis not present

## 2022-02-19 MED ORDER — GADOBUTROL 1 MMOL/ML IV SOLN
9.0000 mL | Freq: Once | INTRAVENOUS | Status: AC | PRN
  Administered 2022-02-19: 9 mL via INTRAVENOUS

## 2022-03-18 ENCOUNTER — Other Ambulatory Visit: Payer: Self-pay | Admitting: Family Medicine

## 2022-04-16 ENCOUNTER — Ambulatory Visit: Payer: Medicare HMO | Admitting: Family Medicine

## 2022-04-26 ENCOUNTER — Other Ambulatory Visit: Payer: Self-pay | Admitting: Family Medicine

## 2022-04-26 NOTE — Telephone Encounter (Signed)
Patient refill request. [facility] (please do not send to mail order pharmacy!)  traMADol (ULTRAM) 50 MG tablet [[id number]]

## 2022-05-04 ENCOUNTER — Encounter: Payer: Self-pay | Admitting: Family Medicine

## 2022-05-04 ENCOUNTER — Ambulatory Visit (INDEPENDENT_AMBULATORY_CARE_PROVIDER_SITE_OTHER): Payer: Medicare HMO | Admitting: Family Medicine

## 2022-05-04 VITALS — BP 114/73 | HR 85 | Temp 98.0°F | Ht 65.5 in | Wt 202.0 lb

## 2022-05-04 DIAGNOSIS — Z5181 Encounter for therapeutic drug level monitoring: Secondary | ICD-10-CM | POA: Diagnosis not present

## 2022-05-04 DIAGNOSIS — Z0289 Encounter for other administrative examinations: Secondary | ICD-10-CM

## 2022-05-04 DIAGNOSIS — M5030 Other cervical disc degeneration, unspecified cervical region: Secondary | ICD-10-CM

## 2022-05-04 DIAGNOSIS — E781 Pure hyperglyceridemia: Secondary | ICD-10-CM

## 2022-05-04 DIAGNOSIS — M810 Age-related osteoporosis without current pathological fracture: Secondary | ICD-10-CM | POA: Diagnosis not present

## 2022-05-04 DIAGNOSIS — E559 Vitamin D deficiency, unspecified: Secondary | ICD-10-CM

## 2022-05-04 DIAGNOSIS — M797 Fibromyalgia: Secondary | ICD-10-CM

## 2022-05-04 DIAGNOSIS — N1832 Chronic kidney disease, stage 3b: Secondary | ICD-10-CM

## 2022-05-04 DIAGNOSIS — K580 Irritable bowel syndrome with diarrhea: Secondary | ICD-10-CM

## 2022-05-04 DIAGNOSIS — F330 Major depressive disorder, recurrent, mild: Secondary | ICD-10-CM

## 2022-05-04 DIAGNOSIS — I10 Essential (primary) hypertension: Secondary | ICD-10-CM

## 2022-05-04 DIAGNOSIS — F410 Panic disorder [episodic paroxysmal anxiety] without agoraphobia: Secondary | ICD-10-CM | POA: Diagnosis not present

## 2022-05-04 DIAGNOSIS — K219 Gastro-esophageal reflux disease without esophagitis: Secondary | ICD-10-CM

## 2022-05-04 DIAGNOSIS — M19071 Primary osteoarthritis, right ankle and foot: Secondary | ICD-10-CM

## 2022-05-04 DIAGNOSIS — Z79899 Other long term (current) drug therapy: Secondary | ICD-10-CM | POA: Diagnosis not present

## 2022-05-04 DIAGNOSIS — M1712 Unilateral primary osteoarthritis, left knee: Secondary | ICD-10-CM

## 2022-05-04 DIAGNOSIS — M545 Low back pain, unspecified: Secondary | ICD-10-CM

## 2022-05-04 DIAGNOSIS — M79604 Pain in right leg: Secondary | ICD-10-CM

## 2022-05-04 DIAGNOSIS — M19072 Primary osteoarthritis, left ankle and foot: Secondary | ICD-10-CM

## 2022-05-04 DIAGNOSIS — D510 Vitamin B12 deficiency anemia due to intrinsic factor deficiency: Secondary | ICD-10-CM

## 2022-05-04 LAB — BASIC METABOLIC PANEL
BUN: 23 mg/dL (ref 6–23)
CO2: 31 mEq/L (ref 19–32)
Calcium: 9.8 mg/dL (ref 8.4–10.5)
Chloride: 103 mEq/L (ref 96–112)
Creatinine, Ser: 1.26 mg/dL — ABNORMAL HIGH (ref 0.40–1.20)
GFR: 41.87 mL/min — ABNORMAL LOW (ref >60.00)
Glucose, Bld: 87 mg/dL (ref 70–99)
Potassium: 4.3 mEq/L (ref 3.5–5.1)
Sodium: 142 mEq/L (ref 135–145)

## 2022-05-04 LAB — VITAMIN B12: Vitamin B-12: 601 pg/mL (ref 211–911)

## 2022-05-04 LAB — MAGNESIUM: Magnesium: 1.9 mg/dL (ref 1.5–2.5)

## 2022-05-04 MED ORDER — TRAZODONE HCL 100 MG PO TABS: 50.0000 mg | ORAL_TABLET | Freq: Every day | ORAL | 1 refills | Status: DC

## 2022-05-04 MED ORDER — DULOXETINE HCL 60 MG PO CPEP: 60.0000 mg | ORAL_CAPSULE | Freq: Every day | ORAL | 1 refills | Status: DC

## 2022-05-04 MED ORDER — LOSARTAN POTASSIUM-HCTZ 50-12.5 MG PO TABS: 0.5000 | ORAL_TABLET | Freq: Every day | ORAL | 1 refills | Status: DC

## 2022-05-04 MED ORDER — DICYCLOMINE HCL 10 MG PO CAPS: 10.0000 mg | ORAL_CAPSULE | Freq: Three times a day (TID) | ORAL | 1 refills | Status: DC

## 2022-05-04 MED ORDER — TRAMADOL HCL 50 MG PO TABS: 50.0000 mg | ORAL_TABLET | Freq: Two times a day (BID) | ORAL | 1 refills | Status: DC | PRN

## 2022-05-04 MED ORDER — ESCITALOPRAM OXALATE 20 MG PO TABS: 20.0000 mg | ORAL_TABLET | Freq: Every day | ORAL | 1 refills | Status: DC

## 2022-05-04 MED ORDER — PRAVASTATIN SODIUM 40 MG PO TABS: 40.0000 mg | ORAL_TABLET | Freq: Every day | ORAL | 3 refills | Status: DC

## 2022-05-04 MED ORDER — OMEPRAZOLE 40 MG PO CPDR: 40.0000 mg | DELAYED_RELEASE_CAPSULE | Freq: Two times a day (BID) | ORAL | 3 refills | Status: DC

## 2022-05-04 MED ORDER — ATENOLOL 25 MG PO TABS: 12.5000 mg | ORAL_TABLET | Freq: Every day | ORAL | 1 refills | Status: DC

## 2022-05-04 NOTE — Progress Notes (Signed)
Patient ID: [name] [name], female  DOB: 08-Mar-1946, 76 y.o.   MRN: [id number] Patient Care Team    Relationship Specialty Notifications Start End  Ma Hillock, DO PCP - General Family Medicine  06/02/15   Gatha Mayer, MD Consulting Physician Gastroenterology  11/24/15   Calvert Cantor, MD Consulting Physician Ophthalmology  11/24/15   Specialists, [facility]  Orthopedic Surgery  04/12/16   Pa, [facility]    01/16/18   Rheumatology, [facility]    01/16/18   Kathrynn Ducking, MD (Inactive) Consulting Physician Neurology  10/24/18     Chief Complaint  Patient presents with   Hypertension    [facility]mc; pt is fasting    Subjective: [name] [name] is a 76 y.o.  female present for [facility]. All past medical history, surgical history, allergies, family history, immunizations, medications and social history were updated in the electronic medical record today. All recent labs, ED visits and hospitalizations within the last year were reviewed.  Hypertension/hyperlipidemia/CKD3:  Pt reports compliance with Pravastatin 40 mg, losartan-HCTZ 50-12.5 mg daily mg and atenolol 12.5 mg. Patient denies chest pain, shortness of breath, dizziness or lower extremity edema.  Pt does not take daily baby ASA. Pt is  prescribed statin. Labs completed today Diet: Low-sodium Exercise: Attempts RF: Hypertension, hyperlipidemia. Family history of heart disease, former smoker, obesity   Anxiety/panic attack/insomnia: Patient reports compliance with lexapro 20 mg daily, Cymbalta twice a day, trazodone 100 mg nightly.. She had been on zoloft and tapered off when lexapro started. Cymbalta has been at maximum dose and likely chosen secondary to her lumbar/arthritis issues.   She does take  Ativan twice daily.She feels her mental health is doing well on current regimen.     Arthritis/pain/lumbar pain with radiation down right leg: Patient reports compliance with tramadol 50 mg twice a day when  necessary for arthritic pain.She suffers from low back pain .  She has h/o anterior cervical decompression/discectomy in the past. Indication for chronic opioid: Moderate to severe arthritis and osteoporosis discomfort Medication and dose: Tramadol 50 mg twice daily as needed # pills per: #60 Last UDS date: UTD Pain contract signed (Y/N): Yes Date narcotic database last reviewed (include red flags): 05/04/22 She has been exercising, performing PT twice a week. She feels her balance is better.       05/04/2022   10:36 AM 11/18/2021    9:40 AM 10/30/2021    9:28 AM 09/16/2021   10:21 AM 04/22/2021    9:11 AM  Depression screen PHQ 2/9  Decreased Interest '3 2 1 '$ 0 1  Down, Depressed, Hopeless 2 1 0 1 0  PHQ - 2 Score '5 3 1 1 1  '$ Altered sleeping '1 3 2  1  '$ Tired, decreased energy '3 3 2  2  '$ Change in appetite 0 0 1  2  Feeling bad or failure about yourself  0 0 0  0  Trouble concentrating 1 0 0  0  Moving slowly or fidgety/restless 0 0 0  0  Suicidal thoughts 0 0 0  0  PHQ-9 Score '10 9 6  6      '$ 05/04/2022   10:36 AM 11/18/2021    9:41 AM 10/30/2021    9:32 AM 04/22/2021    9:11 AM  GAD 7 : Generalized Anxiety Score  Nervous, Anxious, on Edge 1 2 0 0  Control/stop worrying 1 0 2 1  Worry too much - different things 2 2  2 3  Trouble relaxing '2 2 2 1  '$ Restless 0 0 0 0  Easily annoyed or irritable 0 0 0 0  Afraid - awful might happen '1 1 1 1  '$ Total GAD 7 Score '7 7 7 6          '$ 10/26/2021    3:02 PM 09/16/2021   10:24 AM 02/04/2021    9:20 AM 07/22/2020   11:33 AM 06/25/2020   12:55 PM  Fall Risk   Falls in the past year? '1 1 1 1 1  '$ Number falls in past yr: 0 1 0 1 1  Injury with Fall? 0 0 0 0 0  Risk for fall due to :  Impaired vision;Impaired balance/gait;Impaired mobility Impaired balance/gait  History of fall(s)  Follow up  Falls prevention discussed Falls evaluation completed Falls evaluation completed Falls prevention discussed    Immunization History  Administered  Date(s) Administered   Influenza Split 07/02/2011   Influenza Whole 07/16/2008, 07/04/2009, 07/14/2010   Influenza, High Dose Seasonal PF 07/04/2018, 06/20/2019   Influenza,inj,Quad PF,6+ Mos 06/29/2013, 07/08/2014, 06/23/2017   Influenza-Unspecified 07/02/2015, 06/22/2019, 04/30/2021   Moderna Sars-Covid-2 Vaccination 12/20/2019, 01/17/2020, 05/23/2020, 04/30/2021   Pneumococcal Conjugate-13 05/15/2014   Pneumococcal Polysaccharide-23 10/04/2006, 09/11/2015   Td 03/19/2010   Tdap 03/10/2020   Zoster Recombinat (Shingrix) 07/07/2019, 09/05/2019   Zoster, Live 07/25/2015    No results found.  Past Medical History:  Diagnosis Date   Anxiety    Arthritis    oa   At moderate risk for fall 02/04/2021   Atypical lobular hyperplasia [facility]) of left breast 12/06/2019   Body mass index (BMI) 45.0-49.9, adult ([place]) 03/26/2021   Cardiac murmur 08/06/2020   Chronic cystitis    CKD (chronic kidney disease) stage 3, GFR 30-59 ml/min (HCC) 04/13/2017   Cystic thyroid nodule 08/09/2018   7x10 mm, right lobe   DDD (degenerative disc disease), cervical    Depression with anxiety 11/04/2007   Current med: xanax PRN, zoloft 100 mg qd.     Dysphagia 07/28/2018   Essential hypertension 11/04/2007   Current meds: lisinopril/HCTZ 10/12.5 mg qd; tenormin 25 mg qd     Fibromyalgia    Gait instability 01/22/2019   GERD (gastroesophageal reflux disease)    Heart murmur    History of duodenal ulcer    2007   History of gastritis    2007   Hoarseness 08/22/2015   GI- did not feel gerd, ENT did not feel ent, neuro- felt possibly ent--> sent for 2nd opinion ent.    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 This patient is considered osteoporotic according to [facility]) criteria. L-1, L-4 were excluded due to degenerative changes. Per the official positions of the ISCD, it is not possible to quantitatively compare BMD or calculate an [facility] between exams done at different facilities.    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 MRI scheduled. 04/16/2016    Seasonal allergies 02/19/2016   Unspecified venous (peripheral) insufficiency    wears compression hose   Urine incontinence    Vitamin D deficiency 07/20/2008   Taking 1000 iu qd     Vitamin D deficiency, unspecified    Allergies  Allergen Reactions   Meloxicam Swelling    Caused edema and Cr increase   Morphine Nausea And Vomiting    SEVERE   Sulfa Antibiotics Swelling   Past Surgical History:  Procedure Laterality Date   ANTERIOR AND POSTERIOR VAGINAL REPAIR  04/29/2004   AND TRANSVAGINAL TAPE SLING   ANTERIOR CERVICAL DECOMP/DISCECTOMY FUSION  1999   APPENDECTOMY  1984   BILATERAL SALPINGOOPHORECTOMY  1984   Bladder stimulator     BREAST BIOPSY Right 03/2021   BREAST LUMPECTOMY WITH RADIOACTIVE SEED LOCALIZATION Left 10/31/2019   Procedure: LEFT BREAST LUMPECTOMY WITH RADIOACTIVE SEED LOCALIZATION;  Surgeon: Erroll Luna, MD;  Location: [place];  Service: General;  Laterality: Left;   CARDIAC CATHETERIZATION  10-03-2002   DR Dannielle Burn   NORMAL CORONARIES ARTERIES/  EF 55%   CYSTOSCOPY N/A 08/27/2013   Procedure: CYSTOSCOPY;  Surgeon:  Ailene Rud, MD;  Location: [facility];  Service: Urology;  Laterality: N/A;   CYSTOSCOPY WITH INJECTION N/A 12/17/2013   Procedure: Ascencion Dike WITH INJECTION;  Surgeon: Ailene Rud, MD;  Location: [facility];  Service: Urology;  Laterality: N/A;   DILATION AND CURETTAGE OF UTERUS     EXCISION RIGHT NECK AND LEFT BREAST SEBACEOUS CYST  05/18/2002   HEMORRHOID BANDING  2014   PUBOVAGINAL SLING N/A 10/25/2016   Procedure: Gaynelle Arabian;  Surgeon: Carolan Clines, MD;  Location: [facility];  Service: Urology;  Laterality: N/A;   right litlle finger surgery  yrs ago   cyst removed x 4   TOTAL ABDOMINAL HYSTERECTOMY  1986   TUBAL LIGATION     VAGINAL PROLAPSE REPAIR N/A 08/27/2013   Procedure: ANTERIOR VAGINAL VAULT SUSPENSION, KELLY PLICATION WITH SACROSPINOUS LIGAMENT FIXATION AND XENFORM BOVINE DERMIS GRAFT AUGMENTATION, URETHRAL EXPLORATION, URETHROLYSIS, EXPLANTATION OF TVT TAPE, IMPLANTATION OF FLOSEAL, IMPLANTATION OF XENFORM BOVINE GRAFT IN PERIURETHRAL SPACE;  Surgeon: Ailene Rud, MD;  Location: [place];  Service: Urology;  Laterality: N/A   VAGINAL PROLAPSE REPAIR N/A 10/25/2016   Procedure: VAGINAL VAULT SUSPENSION with mesh and sacrospinous repair;  Surgeon: Carolan Clines, MD;  Location: [facility];  Service: Urology;  Laterality: N/A;   Family History  Problem Relation Age of Onset   Hypertension Mother    Heart disease Mother    Myelodysplastic syndrome Mother    Arthritis Mother    Cervical cancer Mother    Lung cancer Father    Coronary artery disease Father    Fibromyalgia Sister    Hypertension Sister    Anemia Sister    Depression Sister    Pancreatic cancer Sister        died 22-Jan-202359 mos after dx   Kidney disease Sister        kidney stones, removed kidney   Hypertension Brother    Anemia Brother    Hypertension Brother    Hyperlipidemia Brother    Dementia Maternal Aunt     Dementia Maternal Aunt    Heart disease  Maternal Uncle    Lung cancer Maternal Uncle    Lung cancer Paternal Uncle    Other Paternal Uncle        brain Tumor   Pancreatic cancer Cousin        maternal   Colon polyps Other        aunt   Colon cancer Neg Hx    Social History   Social History Narrative   She is retired and divorced and has 1 child   Occasional alcohol, former smoker no drug use   Right handed    Caffeine 2-3 cups per day   Lives at alone     Allergies as of 05/04/2022       Reactions   Meloxicam Swelling   Caused edema and Cr increase   Morphine Nausea And Vomiting   SEVERE   Sulfa Antibiotics Swelling        Medication List        Accurate as of May 04, 2022 12:05 PM. If you have any questions, ask your nurse or doctor.          atenolol 25 MG tablet Commonly known as: TENORMIN Take 0.5 tablets (12.5 mg total) by mouth daily.   calcium carbonate 600 MG Tabs tablet Commonly known as: OS-CAL Take 600 mg by mouth daily.   cetirizine 10 MG tablet Commonly known as: ZYRTEC Take 10 mg by mouth daily as needed for allergies.   cholecalciferol 25 MCG (1000 UNIT) tablet Commonly known as: VITAMIN D3 Take 1,000 Units by mouth daily.   dicyclomine 10 MG capsule Commonly known as: BENTYL Take 1 capsule (10 mg total) by mouth 4 (four) times daily -  before meals and at bedtime. What changed: See the new instructions. Changed by: Howard Pouch, DO   DULoxetine 60 MG capsule Commonly known as: CYMBALTA Take 1 capsule (60 mg total) by mouth daily. What changed: when to take this Changed by: Howard Pouch, DO   escitalopram 20 MG tablet Commonly known as: LEXAPRO Take 1 tablet (20 mg total) by mouth daily.   fesoterodine 8 MG Tb24 tablet Commonly known as: TOVIAZ   FISH OIL PO Take 1 capsule by mouth daily.   fluticasone 50 MCG/ACT nasal spray Commonly known as: FLONASE Place 1 spray into both nostrils as needed for allergies.    LORazepam 0.5 MG tablet Commonly known as: ATIVAN Take 1 tablet (0.5 mg total) by mouth every 12 (twelve) hours as needed for anxiety (for panic attack).   losartan-hydrochlorothiazide 50-12.5 MG tablet Commonly known as: HYZAAR Take 0.5 tablets by mouth daily.   omeprazole 40 MG capsule Commonly known as: PRILOSEC Take 1 capsule (40 mg total) by mouth 2 (two) times daily before a meal. 30 mins before breakfast and supper   pravastatin 40 MG tablet Commonly known as: PRAVACHOL Take 1 tablet (40 mg total) by mouth daily.   tamoxifen 20 MG tablet Commonly known as: NOLVADEX Take 20 mg by mouth daily.   traMADol 50 MG tablet Commonly known as: ULTRAM Take 1 tablet (50 mg total) by mouth every 12 (twelve) hours as needed for moderate pain. What changed:  when to take this reasons to take this Changed by: Howard Pouch, DO   traZODone 100 MG tablet Commonly known as: DESYREL Take 0.5-1 tablets (50-100 mg total) by mouth at bedtime. What changed: how much to take Changed by: Howard Pouch, DO   Turmeric Curcumin 500 MG Caps Take 1,000 mg by mouth daily.  Vitamin B-12 1000 MCG Subl Place 1 tablet under the tongue daily.   WOMENS MULTIVITAMIN PLUS PO Take 1 tablet by mouth daily.        All past medical history, surgical history, allergies, family history, immunizations andmedications were updated in the EMR today and reviewed under the history and medication portions of their EMR.      ROS: 14 pt review of systems performed and negative (unless mentioned in an HPI)  Objective: BP 114/73   Pulse 85   Temp 98 F (36.7 C) (Oral)   Ht 5' 5.5" (1.664 m)   Wt 202 lb (91.6 kg)   SpO2 95%   BMI 33.10 kg/m  Physical Exam Vitals and nursing note reviewed.  Constitutional:      General: She is not in acute distress.    Appearance: Normal appearance. She is not ill-appearing, toxic-appearing or diaphoretic.  HENT:     Head: Normocephalic and atraumatic.  Eyes:      General: No scleral icterus.       Right eye: No discharge.        Left eye: No discharge.     Extraocular Movements: Extraocular movements intact.     Conjunctiva/sclera: Conjunctivae normal.     Pupils: Pupils are equal, round, and reactive to light.  Cardiovascular:     Rate and Rhythm: Normal rate and regular rhythm.  Pulmonary:     Effort: Pulmonary effort is normal. No respiratory distress.     Breath sounds: Normal breath sounds. No wheezing, rhonchi or rales.  Musculoskeletal:     Cervical back: Neck supple. No tenderness.     Right lower leg: No edema.     Left lower leg: No edema.  Lymphadenopathy:     Cervical: No cervical adenopathy.  Skin:    General: Skin is warm and dry.     Coloration: Skin is not jaundiced or pale.     Findings: No erythema or rash.  Neurological:     Mental Status: She is alert and oriented to person, place, and time. Mental status is at baseline.     Motor: No weakness.     Gait: Gait normal.  Psychiatric:        Mood and Affect: Mood normal.        Behavior: Behavior normal.        Thought Content: Thought content normal.        Judgment: Judgment normal.    Assessment/plan: [name] [name] is a 76 y.o. female present for [facility] Depression with anxiety/insomnia/panic d/o/benzo use Stabl Continue Lexapro 20 mg daily- refills provided today Continue  Cymbalta 60 mg> decrease d/t renal fx to qd Continue   trazodone 100 mg daily at bedtime. -continue  Ativan provided for 6 months. NCCS database reviewed and appropriate 05/04/22 - UDS - UTD - Controlled substance contract signed. - pt declined psychology referral.    Hypertension/morbid obesity/hyperlipidemia//palpitations: Stable Continue losartan-HCTZ (1/2 tab) - continue atenolol to 12.5 mg daily. If palpitations worsen, can return to atenolol 25 mg daily.   - continue statin - diet and exercise modifications recommended.   CKD3: Renal dose medications.  Avoid nsaids   hydrate  Vitamin D deficiency/osteoporosis:  Continue   vit d supplement reclast - last tx d/t to 5 yr >01/2021  Arthritis/pain/lumbar pain: stable continue tramadol for chronic arthritic pain. Encouraged her to take tramadol every 12 hours daily (was only routinely taking once).  - UDS UTD - contract up-to-date. - NCCS database reviewed  05/04/22 and appropriate.  - Follow-up in 5.5 months if needing prescription refilled.   Atypical lobular hyperplasia ([facility]) of left breast Following with surgical team  Pernicious anemia Continue B12 B12 levels collected today  Other irritable bowel syndrome Stable bentyl Continue    Return in about 24 weeks (around 10/19/2022) for Routine chronic condition follow-up.  Orders Placed This Encounter  Procedures   Basic Metabolic Panel (BMET)   Y63   Magnesium    Meds ordered this encounter  Medications   atenolol (TENORMIN) 25 MG tablet    Sig: Take 0.5 tablets (12.5 mg total) by mouth daily.    Dispense:  45 tablet    Refill:  1   dicyclomine (BENTYL) 10 MG capsule    Sig: Take 1 capsule (10 mg total) by mouth 4 (four) times daily -  before meals and at bedtime.    Dispense:  120 capsule    Refill:  1   DULoxetine (CYMBALTA) 60 MG capsule    Sig: Take 1 capsule (60 mg total) by mouth daily.    Dispense:  90 capsule    Refill:  1   escitalopram (LEXAPRO) 20 MG tablet    Sig: Take 1 tablet (20 mg total) by mouth daily.    Dispense:  90 tablet    Refill:  1   losartan-hydrochlorothiazide (HYZAAR) 50-12.5 MG tablet    Sig: Take 0.5 tablets by mouth daily.    Dispense:  90 tablet    Refill:  1   omeprazole (PRILOSEC) 40 MG capsule    Sig: Take 1 capsule (40 mg total) by mouth 2 (two) times daily before a meal. 30 mins before breakfast and supper    Dispense:  180 capsule    Refill:  3   pravastatin (PRAVACHOL) 40 MG tablet    Sig: Take 1 tablet (40 mg total) by mouth daily.    Dispense:  90 tablet    Refill:  3   traZODone  (DESYREL) 100 MG tablet    Sig: Take 0.5-1 tablets (50-100 mg total) by mouth at bedtime.    Dispense:  90 tablet    Refill:  1   traMADol (ULTRAM) 50 MG tablet    Sig: Take 1 tablet (50 mg total) by mouth every 12 (twelve) hours as needed for moderate pain.    Dispense:  180 tablet    Refill:  1    Referral Orders  No referral(s) requested today     Note is dictated utilizing voice recognition software. Although note has been proof read prior to signing, occasional typographical errors still can be missed. If any questions arise, please do not hesitate to call for verification.  Electronically signed by: Howard Pouch, DO [place]

## 2022-05-04 NOTE — Patient Instructions (Addendum)
Decrease cymbalta to once a day Take ZYRTEC at NIGHT (not in the day) We may decrease trazodone to 1/2 tab, we will let you know after your results.   Start reading again!!!        Great to see you today.  I have refilled the medication(s) we provide.   If labs were collected, we will inform you of lab results once received either by echart message or telephone call.   - echart message- for normal results that have been seen by the patient already.   - telephone call: abnormal results or if patient has not viewed results in their echart.

## 2022-05-05 ENCOUNTER — Telehealth: Payer: Self-pay | Admitting: Family Medicine

## 2022-05-05 ENCOUNTER — Telehealth: Payer: Self-pay

## 2022-05-05 DIAGNOSIS — E538 Deficiency of other specified B group vitamins: Secondary | ICD-10-CM

## 2022-05-05 DIAGNOSIS — D510 Vitamin B12 deficiency anemia due to intrinsic factor deficiency: Secondary | ICD-10-CM

## 2022-05-05 MED ORDER — CYANOCOBALAMIN 1000 MCG/ML IJ SOLN: 1000.0000 ug | INTRAMUSCULAR | Status: AC

## 2022-05-05 NOTE — Telephone Encounter (Signed)
Please call patient Kidney function is decreased from normal, but stable from her prior collections.  Make sure to remind her to increase hydration. Her B12 levels are normal at 601 continue the supplement.  If she would desire to have monthly injections by nurse visit for B12 1000 mcg IM, we can set her up for that it would raise her B12 higher in the normal range and she would sublingual the or supplement as well. Her magnesium is mildly low.  I would encourage her to start a magnesium supplement of 250-400 mg before bed.  This also may help her sleep.     B12 1000 mcg IM by nurse visit every 4 weeks #12 Order placed

## 2022-05-05 NOTE — Telephone Encounter (Signed)
Spoke with patient regarding results/recommendations.  

## 2022-05-05 NOTE — Telephone Encounter (Signed)
A user error has taken place: encounter opened in error, closed for administrative reasons.

## 2022-05-12 ENCOUNTER — Ambulatory Visit
Admission: RE | Admit: 2022-05-12 | Discharge: 2022-05-12 | Disposition: A | Discharge status: Home / Self Care | Payer: Medicare HMO | Source: Ambulatory Visit | Attending: Rheumatology | Admitting: Rheumatology

## 2022-05-12 DIAGNOSIS — M8589 Other specified disorders of bone density and structure, multiple sites: Secondary | ICD-10-CM | POA: Diagnosis not present

## 2022-05-12 DIAGNOSIS — Z780 Asymptomatic menopausal state: Secondary | ICD-10-CM | POA: Diagnosis not present

## 2022-05-12 DIAGNOSIS — M810 Age-related osteoporosis without current pathological fracture: Secondary | ICD-10-CM

## 2022-06-16 ENCOUNTER — Telehealth: Payer: Self-pay | Admitting: Family Medicine

## 2022-06-16 NOTE — Telephone Encounter (Signed)
Pt having issues with getting a refill for Fesoterodine. She reports [facility] won't refill the medication and advised her to call.

## 2022-06-16 NOTE — Telephone Encounter (Signed)
Spoke with patient and advised her to call urologist

## 2022-06-18 ENCOUNTER — Other Ambulatory Visit: Payer: Self-pay | Admitting: Family Medicine

## 2022-08-05 ENCOUNTER — Other Ambulatory Visit: Payer: Self-pay | Admitting: Family Medicine

## 2022-08-10 DIAGNOSIS — Z961 Presence of intraocular lens: Secondary | ICD-10-CM | POA: Diagnosis not present

## 2022-08-10 DIAGNOSIS — H43813 Vitreous degeneration, bilateral: Secondary | ICD-10-CM | POA: Diagnosis not present

## 2022-08-10 DIAGNOSIS — H17823 Peripheral opacity of cornea, bilateral: Secondary | ICD-10-CM | POA: Diagnosis not present

## 2022-08-10 DIAGNOSIS — H35342 Macular cyst, hole, or pseudohole, left eye: Secondary | ICD-10-CM | POA: Diagnosis not present

## 2022-08-10 DIAGNOSIS — H40022 Open angle with borderline findings, high risk, left eye: Secondary | ICD-10-CM | POA: Diagnosis not present

## 2022-08-10 DIAGNOSIS — H52212 Irregular astigmatism, left eye: Secondary | ICD-10-CM | POA: Diagnosis not present

## 2022-08-10 DIAGNOSIS — H16223 Keratoconjunctivitis sicca, not specified as Sjogren's, bilateral: Secondary | ICD-10-CM | POA: Diagnosis not present

## 2022-08-10 DIAGNOSIS — H524 Presbyopia: Secondary | ICD-10-CM | POA: Diagnosis not present

## 2022-09-14 DIAGNOSIS — N3020 Other chronic cystitis without hematuria: Secondary | ICD-10-CM | POA: Diagnosis not present

## 2022-09-14 DIAGNOSIS — N3946 Mixed incontinence: Secondary | ICD-10-CM | POA: Diagnosis not present

## 2022-09-22 ENCOUNTER — Ambulatory Visit: Payer: Medicare HMO

## 2022-10-07 DIAGNOSIS — N3946 Mixed incontinence: Secondary | ICD-10-CM | POA: Diagnosis not present

## 2022-10-11 ENCOUNTER — Other Ambulatory Visit: Payer: Self-pay | Admitting: Family Medicine

## 2022-10-11 DIAGNOSIS — F410 Panic disorder [episodic paroxysmal anxiety] without agoraphobia: Secondary | ICD-10-CM

## 2022-10-13 ENCOUNTER — Ambulatory Visit (INDEPENDENT_AMBULATORY_CARE_PROVIDER_SITE_OTHER): Payer: Medicare HMO

## 2022-10-13 DIAGNOSIS — Z0000 Encounter for general adult medical examination without abnormal findings: Secondary | ICD-10-CM | POA: Diagnosis not present

## 2022-10-13 NOTE — Progress Notes (Signed)
Subjective:   [name] [name] is a 76 y.o. female who presents for Medicare Annual (Subsequent) preventive examination.  I connected with  [name] on 10/13/22 by an audio only telemedicine application and verified that I am speaking with the correct person using two identifiers.   I discussed the limitations, risks, security and privacy concerns of performing an evaluation and management service by telephone and the availability of in person appointments. I also discussed with the patient that there may be a patient responsible charge related to this service. The patient expressed understanding and verbally consented to this telephonic visit.  Location of Patient: home Location of Provider: office  List any persons and their role that are participating in the visit with the patient.   Dalene Seltzer, CMA  Review of Systems    Defer to PCP Cardiac Risk Factors include: advanced age (>66mn, >>66women);dyslipidemia;obesity (BMI >30kg/m2);hypertension     Objective:    There were no vitals filed for this visit. There is no height or weight on file to calculate BMI.     10/13/2022    9:09 AM 09/16/2021   10:19 AM 06/25/2020   12:54 PM 10/31/2019    7:00 AM 10/24/2019    2:14 PM 01/22/2019    3:42 PM 01/16/2018    8:33 AM  Advanced Directives  Does Patient Have a Medical Advance Directive? Yes Yes Yes Yes Yes Yes Yes  Type of AParamed[place]of [place] will Healthcare Power of [place]ew [place]Living will HLincoln UniversityLiving will HH[place]Living will Living will;Healthcare Power of Attorney Living will;Healthcare Power of Attorney  Does patient want to make changes to medical advance directive? No - Patient declined   No - Patient declined No - Guardian declined No - Patient declined   Copy of [place] Chart? Yes - validated most recent copy scanned in chart (See  row information) Yes - validated most recent copy scanned in chart (See row information) Yes - validated most recent copy scanned in chart (See row information) Yes - validated most recent copy scanned in chart (See row information) Yes - validated most recent copy scanned in chart (See row information) No - copy requested Yes  Would patient like information on creating a medical advance directive?     No - Guardian declined      Current Medications (verified) Outpatient Encounter Medications as of 10/13/2022  Medication Sig   atenolol (TENORMIN) 25 MG tablet Take 0.5 tablets (12.5 mg total) by mouth daily.   calcium carbonate (OS-CAL) 600 MG TABS Take 600 mg by mouth daily.   cetirizine (ZYRTEC) 10 MG tablet Take 10 mg by mouth daily as needed for allergies.   cholecalciferol (VITAMIN D3) 25 MCG (1000 UNIT) tablet Take 1,000 Units by mouth daily.   Cyanocobalamin (VITAMIN B-12) 1000 MCG SUBL Place 1 tablet under the tongue daily.    dicyclomine (BENTYL) 10 MG capsule Take 1 capsule (10 mg total) by mouth 4 (four) times daily -  before meals and at bedtime.   DULoxetine (CYMBALTA) 60 MG capsule Take 1 capsule (60 mg total) by mouth daily.   escitalopram (LEXAPRO) 20 MG tablet Take 1 tablet (20 mg total) by mouth daily.   fesoterodine (TOVIAZ) 8 MG TB24 tablet    fluticasone (FLONASE) 50 MCG/ACT nasal spray Place 1 spray into both nostrils as needed for allergies.   LORazepam (ATIVAN) 0.5 MG tablet Take 1  tablet (0.5 mg total) by mouth every 12 (twelve) hours as needed for anxiety (for panic attack).   losartan-hydrochlorothiazide (HYZAAR) 50-12.5 MG tablet Take 0.5 tablets by mouth daily.   Multiple Vitamins-Minerals (WOMENS MULTIVITAMIN PLUS PO) Take 1 tablet by mouth daily.   Omega-3 Fatty Acids (FISH OIL PO) Take 1 capsule by mouth daily.    omeprazole (PRILOSEC) 40 MG capsule Take 1 capsule (40 mg total) by mouth 2 (two) times daily before a meal. 30 mins before breakfast and supper    pravastatin (PRAVACHOL) 40 MG tablet Take 1 tablet (40 mg total) by mouth daily.   tamoxifen (NOLVADEX) 20 MG tablet Take 20 mg by mouth daily.    traMADol (ULTRAM) 50 MG tablet Take 1 tablet (50 mg total) by mouth every 12 (twelve) hours as needed for moderate pain.   traZODone (DESYREL) 100 MG tablet Take 0.5-1 tablets (50-100 mg total) by mouth at bedtime.   Turmeric Curcumin 500 MG CAPS Take 1,000 mg by mouth daily.    Facility-Administered Encounter Medications as of 10/13/2022  Medication   cyanocobalamin (VITAMIN B12) injection 1,000 mcg    Allergies (verified) Meloxicam, Morphine, and Sulfa antibiotics   History: Past Medical History:  Diagnosis Date   Anxiety    Arthritis    oa   At moderate risk for fall 02/04/2021   Atypical lobular hyperplasia [facility]) of left breast 12/06/2019   Body mass index (BMI) 45.0-49.9, adult ([place]) 03/26/2021   Cardiac murmur 08/06/2020   Chronic cystitis    CKD (chronic kidney disease) stage 3, GFR 30-59 ml/min (HCC) 04/13/2017   Cystic thyroid nodule 08/09/2018   7x10 mm, right lobe   DDD (degenerative disc disease), cervical    Depression with anxiety 11/04/2007   Current med: xanax PRN, zoloft 100 mg qd.     Dysphagia 07/28/2018   Essential hypertension 11/04/2007   Current meds: lisinopril/HCTZ 10/12.5 mg qd; tenormin 25 mg qd     Fibromyalgia    Gait instability 01/22/2019   GERD (gastroesophageal reflux disease)    Heart murmur    History of duodenal ulcer    2007   History of gastritis    2007   Hoarseness 08/22/2015   GI- did not feel gerd, ENT did not feel ent, neuro- felt possibly ent--> sent for 2nd opinion ent.    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 This patient is considered osteoporotic according to [facility]) criteria. L-1, L-4 were excluded due to degenerative changes. Per the official positions of the ISCD, it is not possible to quantitatively compare BMD or calculate an [facility] between exams done at different facilities.    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 MRI scheduled. 04/16/2016  Seasonal allergies 02/19/2016   Unspecified venous (peripheral) insufficiency    wears compression hose   Urine incontinence    Vitamin D deficiency 07/20/2008   Taking 1000 iu qd     Vitamin D deficiency, unspecified    Past Surgical History:  Procedure Laterality Date   ANTERIOR AND POSTERIOR VAGINAL REPAIR  04/29/2004   AND TRANSVAGINAL TAPE SLING   ANTERIOR CERVICAL DECOMP/DISCECTOMY FUSION  1999   APPENDECTOMY  1984   BILATERAL SALPINGOOPHORECTOMY  1984   Bladder stimulator     BREAST BIOPSY Right 03/2021   BREAST LUMPECTOMY WITH RADIOACTIVE SEED LOCALIZATION Left 10/31/2019   Procedure: LEFT BREAST LUMPECTOMY WITH RADIOACTIVE SEED LOCALIZATION;  Surgeon: Erroll Luna, MD;  Location: [place];  Service: General;  Laterality: Left;   CARDIAC CATHETERIZATION  10-03-2002   DR Dannielle Burn   NORMAL CORONARIES ARTERIES/  EF 55%   CYSTOSCOPY N/A 08/27/2013   Procedure: CYSTOSCOPY;  Surgeon: Ailene Rud, MD;  Location[facility];  Service: Urology;  Laterality: N/A;   CYSTOSCOPY WITH INJECTION N/A  12/17/2013   Procedure: Ascencion Dike WITH INJECTION;  Surgeon: Ailene Rud, MD;  [facility];  Service: Urology;  Laterality: N/A;   DILATION AND CURETTAGE OF UTERUS     EXCISION RIGHT NECK AND LEFT BREAST SEBACEOUS CYST  05/18/2002   HEMORRHOID BANDING  2014   PUBOVAGINAL SLING N/A 10/25/2016   Procedure: Gaynelle Arabian;  Surgeon: Carolan Clines, MD;  Location: [facility];  Service: Urology;  Laterality: N/A;   right litlle finger surgery  yrs ago   cyst removed x 4   TOTAL ABDOMINAL HYSTERECTOMY  1986   TUBAL LIGATION     VAGINAL PROLAPSE REPAIR N/A 08/27/2013   Procedure: ANTERIOR VAGINAL VAULT SUSPENSION, KELLY PLICATION WITH SACROSPINOUS LIGAMENT FIXATION AND XENFORM BOVINE DERMIS GRAFT AUGMENTATION, URETHRAL EXPLORATION, URETHROLYSIS, EXPLANTATION OF TVT TAPE, IMPLANTATION OF FLOSEAL, IMPLANTATION OF XENFORM BOVINE GRAFT IN PERIURETHRAL SPACE;  Surgeon: Ailene Rud, MD;  Location: [place];  Service: Urology;  Laterality: N/A   VAGINAL PROLAPSE REPAIR N/A 10/25/2016   Procedure: VAGINAL VAULT SUSPENSION with mesh and sacrospinous repair;  Surgeon: Carolan Clines, MD;  Location: [facility];  Service: Urology;  Laterality: N/A;   Family History  Problem Relation Age of Onset   Hypertension Mother    Heart disease Mother    Myelodysplastic syndrome Mother    Arthritis Mother    Cervical cancer Mother    Lung cancer Father    Coronary artery disease Father    Fibromyalgia Sister    Hypertension Sister    Anemia Sister    Depression Sister    Pancreatic cancer Sister        died [id number] mos after dx   Kidney disease Sister        kidney stones, removed kidney   Hypertension Brother    Anemia Brother    Hypertension Brother    Hyperlipidemia Brother    Dementia Maternal Aunt    Dementia Maternal Aunt    Heart disease Maternal Uncle    Lung cancer Maternal Uncle    Lung cancer Paternal Uncle    Other  Paternal Uncle        brain Tumor   Pancreatic cancer Cousin        maternal   Colon polyps Other        aunt   Colon cancer Neg Hx    Social History   Socioeconomic  History   Marital status: Divorced    Spouse name: Not on file   Number of children: 1   Years of education: Not on file   Highest education level: 12th grade  Occupational History   Occupation: Retired  Tobacco Use   Smoking status: Former    Packs/day: 1.00    Years: 15.00    Total pack years: 15.00    Types: Cigarettes    Start date: 12/06/1993    Quit date: 08/09/2009    Years since quitting: 13.1   Smokeless tobacco: Never   Tobacco comments:    quit smoking 5 years ago  Vaping Use   Vaping Use: Never used  Substance and Sexual Activity   Alcohol use: Not Currently    Comment: ocassional   Drug use: No   Sexual activity: Not Currently    Partners: Male  Other Topics Concern   Not on file  Social History Narrative   She is retired and divorced and has 1 child   Occasional alcohol, former smoker no drug use   Right handed    Caffeine 2-3 cups per day   Lives at alone    Social Determinants of Health   Financial Resource Strain: [place]  (10/13/2022)   Overall Financial Resource Strain (CARDIA)    Difficulty of Paying Living Expenses: Not very hard  Food Insecurity: No Food Insecurity (10/13/2022)   Hunger Vital Sign    Worried About Running Out of Food in the Last Year: Never true    Ran Out of Food in the Last Year: Never true  Transportation Needs: No Transportation Needs (10/13/2022)   PRAPARE - Hydrologist (Medical): No    Lack of Transportation (Non-Medical): No  Physical Activity: Inactive (10/13/2022)   Exercise Vital Sign    Days of Exercise per Week: 0 days    Minutes of Exercise per Session: 0 min  Stress: No Stress Concern Present (10/13/2022)   [place]    Feeling of Stress : Only a  little  Social Connections: Moderately Isolated (10/13/2022)   Social Connection and Isolation Panel [NHANES]    Frequency of Communication with Friends and Family: More than three times a week    Frequency of Social Gatherings with Friends and Family: Once a week    Attends Religious Services: More than 4 times per year    Active Member of [employer] or Organizations: No    Attends Music therapist: Never    Marital Status: Divorced    Tobacco Counseling Counseling given: Not Answered Tobacco comments: quit smoking 5 years ago   Clinical Intake:  Pre-visit preparation completed: Yes  Pain : No/denies pain     Diabetes: No  How often do you need to have someone help you when you read instructions, pamphlets, or other written materials from your doctor or pharmacy?: 1 - Never What is the last grade level you completed in school?: 12th  Diabetic?no         Activities of Daily Living    10/13/2022    9:07 AM  In your present state of health, do you have any difficulty performing the following activities:  Hearing? 0  Vision? 0  Difficulty concentrating or making decisions? 0  Walking or climbing stairs? 0  Dressing or bathing? 0  Doing errands, shopping? 0  Preparing Food and eating ? N  Using the Toilet? N  In the past  six months, have you accidently leaked urine? Y  Do you have problems with loss of bowel control? N  Managing your Medications? N  Managing your Finances? N  Housekeeping or managing your Housekeeping? N    Patient Care Team: Ma Hillock, DO as PCP - General (Family Medicine) Gatha Mayer, MD as Consulting Physician (Gastroenterology) Calvert Cantor, MD as Consulting Physician (Ophthalmology) [facility] [place] [facility], [facility], Carlis Abbott, Elon Alas, MD (Inactive) as Consulting Physician (Neurology)  Indicate any recent Medical Services you may have  received from other than [facility] providers in the past year (date may be approximate).     Assessment:   This is a routine wellness examination for [place].  Hearing/Vision screen No results found.  Dietary issues and exercise activities discussed: Current Exercise Habits: The patient does not participate in regular exercise at present, Exercise limited by: None identified   Goals Addressed   None   Depression Screen    10/13/2022    9:04 AM 05/04/2022   10:36 AM 11/18/2021    9:40 AM 10/30/2021    9:28 AM 09/16/2021   10:21 AM 04/22/2021    9:11 AM 02/04/2021    9:22 AM  PHQ 2/9 Scores  PHQ - 2 Score '5 5 3 1 1 1 1  '$ PHQ- 9 Score '6 10 9 6  6 4    '$ Fall Risk    10/13/2022    9:07 AM 10/26/2021    3:02 PM 09/16/2021   10:24 AM 02/04/2021    9:20 AM 07/22/2020   11:33 AM  Fall Risk   Falls in the past year? '1 1 1 1 1  '$ Number falls in past yr: 0 0 1 0 1  Injury with Fall? 0 0 0 0 0  Risk for fall due to :   Impaired vision;Impaired balance/gait;Impaired mobility Impaired balance/gait   Follow up Falls evaluation completed  Falls prevention discussed Falls evaluation completed Falls evaluation completed    FALL RISK PREVENTION PERTAINING TO THE HOME:  Any stairs in or around the home? Yes  If so, are there any without handrails? No  Home free of loose throw rugs in walkways, pet beds, electrical cords, etc? Yes  Adequate lighting in your home to reduce risk of falls? Yes   ASSISTIVE DEVICES UTILIZED TO PREVENT FALLS:  Life alert? No  Use of a cane, walker or w/c? No  Grab bars in the bathroom? No  Shower chair or bench in shower? No  Elevated toilet seat or a handicapped toilet? Yes   TIMED UP AND GO:  Was the test performed? No .  Length of time to ambulate 10 feet: n/a sec.     Cognitive Function:    01/16/2018    8:37 AM 11/24/2015    8:36 AM  MMSE - Mini Mental State Exam  Orientation to time 5 5  Orientation to Place 5 5  Registration 3 3  Attention/ Calculation  5 5  Recall 3 3  Language- name 2 objects 2 2  Language- repeat 1 1  Language- follow 3 step command 3 3  Language- read & follow direction 1 1  Write a sentence 1 1  Copy design 1 1  Total score 30 30        10/13/2022    9:08 AM  6CIT Screen  What Year? 0 points  What month? 0 points  What time? 0 points  Count back from 20 0  points  Months in reverse 0 points  Repeat phrase 0 points  Total Score 0 points    Immunizations Immunization History  Administered Date(s) Administered   Fluad Quad(high Dose 65+) 07/04/2022   Influenza Split 07/02/2011   Influenza Whole 07/16/2008, 07/04/2009, 07/14/2010   Influenza, High Dose Seasonal PF 07/04/2018, 06/20/2019   Influenza,inj,Quad PF,6+ Mos 06/29/2013, 07/08/2014, 06/23/2017   Influenza-Unspecified 07/02/2015, 06/22/2019, 04/30/2021   Moderna Sars-Covid-2 Vaccination 12/20/2019, 01/17/2020, 05/23/2020, 04/30/2021   Pneumococcal Conjugate-13 05/15/2014   Pneumococcal Polysaccharide-23 10/04/2006, 09/11/2015   Td 03/19/2010   Tdap 03/10/2020   Zoster Recombinat (Shingrix) 07/07/2019, 09/05/2019   Zoster, Live 07/25/2015    Pneumococcal vaccine status: Up to date  Flu Vaccine status: Up to date  Pneumococcal vaccine status: Up to date  Covid-19 vaccine status: Completed vaccines  Qualifies for Shingles Vaccine? Yes   Zostavax completed No   Shingrix Completed?: Yes  Screening Tests Health Maintenance  Topic Date Due   COVID-19 Vaccine (5 - 2023-24 season) 06/04/2022   Medicare Annual Wellness (AWV)  10/14/2023   DEXA SCAN  05/12/2025   DTaP/Tdap/Td (3 - Td or Tdap) 03/10/2030   Pneumonia Vaccine 48+ Years old  Completed   INFLUENZA VACCINE  Completed   Hepatitis C Screening  Completed   Zoster Vaccines- Shingrix  Completed   HPV VACCINES  Aged Out    Health Maintenance  Health Maintenance Due  Topic Date Due   COVID-19 Vaccine (5 - 2023-24 season) 06/04/2022    Colorectal cancer screening: No longer  required.   Mammogram status: No longer required due to Age.  Bone Density status: Completed 05/12/22. Results reflect: Bone density results: OSTEOPENIA. Repeat every 2 years.  Lung Cancer Screening: (Low Dose CT Chest recommended if Age 69-80 years, 30 pack-year currently smoking OR have quit w/in 15years.) does not qualify.   Lung Cancer Screening Referral: n/a  Additional Screening:  Hepatitis C Screening: does qualify; Completed 03/11/2015  Vision Screening: Recommended annual ophthalmology exams for early detection of glaucoma and other disorders of the eye. Is the patient up to date with their annual eye exam?  Yes  Who is the provider or what is the name of the office in which the patient attends annual eye exams? Dr. Bing Plume If pt is not established with a provider, would they like to be referred to a provider to establish care? No .   Dental Screening: Recommended annual dental exams for proper oral hygiene  Community Resource Referral / Chronic Care Management: CRR required this visit?  No   CCM required this visit?  No      Plan:     I have personally reviewed and noted the following in the patient's chart:   Medical and social history Use of alcohol, tobacco or illicit drugs  Current medications and supplements including opioid prescriptions. Patient is not currently taking opioid prescriptions. Functional ability and status Nutritional status Physical activity Advanced directives List of other physicians Hospitalizations, surgeries, and ER visits in previous 12 months Vitals Screenings to include cognitive, depression, and falls Referrals and appointments  In addition, I have reviewed and discussed with patient certain preventive protocols, quality metrics, and best practice recommendations. A written personalized care plan for preventive services as well as general preventive health recommendations were provided to patient.     Beatrix Fetters,  [place]   10/13/2022   Nurse Notes: Non-Face to Face or Face to Face 8 minute visit Encounter   Ms. Jansma , Thank you for taking  time to come for your Medicare Wellness Visit. I appreciate your ongoing commitment to your health goals. Please review the following plan we discussed and let me know if I can assist you in the future.   These are the goals we discussed:  Goals      Patient Stated     Eat a healthier diet & increase activity     Patient Stated     Working on lose weight         This is a list of the screening recommended for you and due dates:  Health Maintenance  Topic Date Due   COVID-19 Vaccine (5 - 2023-24 season) 06/04/2022   Medicare Annual Wellness Visit  10/14/2023   DEXA scan (bone density measurement)  05/12/2025   DTaP/Tdap/Td vaccine (3 - Td or Tdap) 03/10/2030   Pneumonia Vaccine  Completed   Flu Shot  Completed   Hepatitis C Screening: USPSTF Recommendation to screen - Ages 89-79 yo.  Completed   Zoster (Shingles) Vaccine  Completed   HPV Vaccine  Aged Out  \

## 2022-10-13 NOTE — Patient Instructions (Signed)

## 2022-10-19 ENCOUNTER — Ambulatory Visit (INDEPENDENT_AMBULATORY_CARE_PROVIDER_SITE_OTHER): Payer: Medicare HMO | Admitting: Family Medicine

## 2022-10-19 ENCOUNTER — Encounter: Payer: Self-pay | Admitting: Family Medicine

## 2022-10-19 VITALS — BP 133/76 | HR 67 | Temp 97.8°F | Ht 65.5 in | Wt 205.0 lb

## 2022-10-19 DIAGNOSIS — I10 Essential (primary) hypertension: Secondary | ICD-10-CM | POA: Diagnosis not present

## 2022-10-19 DIAGNOSIS — M5030 Other cervical disc degeneration, unspecified cervical region: Secondary | ICD-10-CM

## 2022-10-19 DIAGNOSIS — M1712 Unilateral primary osteoarthritis, left knee: Secondary | ICD-10-CM

## 2022-10-19 DIAGNOSIS — N1832 Chronic kidney disease, stage 3b: Secondary | ICD-10-CM

## 2022-10-19 DIAGNOSIS — F330 Major depressive disorder, recurrent, mild: Secondary | ICD-10-CM | POA: Diagnosis not present

## 2022-10-19 DIAGNOSIS — Z79899 Other long term (current) drug therapy: Secondary | ICD-10-CM

## 2022-10-19 DIAGNOSIS — M19072 Primary osteoarthritis, left ankle and foot: Secondary | ICD-10-CM

## 2022-10-19 DIAGNOSIS — M545 Low back pain, unspecified: Secondary | ICD-10-CM

## 2022-10-19 DIAGNOSIS — I700 Atherosclerosis of aorta: Secondary | ICD-10-CM

## 2022-10-19 DIAGNOSIS — M19071 Primary osteoarthritis, right ankle and foot: Secondary | ICD-10-CM

## 2022-10-19 DIAGNOSIS — E538 Deficiency of other specified B group vitamins: Secondary | ICD-10-CM | POA: Diagnosis not present

## 2022-10-19 DIAGNOSIS — R531 Weakness: Secondary | ICD-10-CM

## 2022-10-19 DIAGNOSIS — M797 Fibromyalgia: Secondary | ICD-10-CM

## 2022-10-19 DIAGNOSIS — F419 Anxiety disorder, unspecified: Secondary | ICD-10-CM

## 2022-10-19 DIAGNOSIS — E559 Vitamin D deficiency, unspecified: Secondary | ICD-10-CM

## 2022-10-19 DIAGNOSIS — E781 Pure hyperglyceridemia: Secondary | ICD-10-CM

## 2022-10-19 DIAGNOSIS — K219 Gastro-esophageal reflux disease without esophagitis: Secondary | ICD-10-CM

## 2022-10-19 DIAGNOSIS — Z0289 Encounter for other administrative examinations: Secondary | ICD-10-CM

## 2022-10-19 DIAGNOSIS — M810 Age-related osteoporosis without current pathological fracture: Secondary | ICD-10-CM | POA: Diagnosis not present

## 2022-10-19 DIAGNOSIS — K580 Irritable bowel syndrome with diarrhea: Secondary | ICD-10-CM

## 2022-10-19 DIAGNOSIS — T733XXA Exhaustion due to excessive exertion, initial encounter: Secondary | ICD-10-CM

## 2022-10-19 DIAGNOSIS — R5383 Other fatigue: Secondary | ICD-10-CM

## 2022-10-19 DIAGNOSIS — F410 Panic disorder [episodic paroxysmal anxiety] without agoraphobia: Secondary | ICD-10-CM

## 2022-10-19 DIAGNOSIS — M79604 Pain in right leg: Secondary | ICD-10-CM

## 2022-10-19 LAB — COMPREHENSIVE METABOLIC PANEL
ALT: 9 U/L (ref 0–35)
AST: 13 U/L (ref 0–37)
Albumin: 4 g/dL (ref 3.5–5.2)
Alkaline Phosphatase: 48 U/L (ref 39–117)
BUN: 17 mg/dL (ref 6–23)
CO2: 32 mEq/L (ref 19–32)
Calcium: 9.5 mg/dL (ref 8.4–10.5)
Chloride: 104 mEq/L (ref 96–112)
Creatinine, Ser: 1.29 mg/dL — ABNORMAL HIGH (ref 0.40–1.20)
GFR: 40.57 mL/min — ABNORMAL LOW (ref >60.00)
Glucose, Bld: 89 mg/dL (ref 70–99)
Potassium: 5 mEq/L (ref 3.5–5.1)
Sodium: 143 mEq/L (ref 135–145)
Total Bilirubin: 0.3 mg/dL (ref 0.2–1.2)
Total Protein: 6.3 g/dL (ref 6.0–8.3)

## 2022-10-19 LAB — TSH: TSH: 4.08 u[IU]/mL (ref 0.35–5.50)

## 2022-10-19 LAB — LIPID PANEL
Cholesterol: 187 mg/dL (ref 0–200)
HDL: 56.9 mg/dL (ref >39.00)
LDL Cholesterol: 94 mg/dL (ref 0–99)
NonHDL: 130.54
Total CHOL/HDL Ratio: 3
Triglycerides: 185 mg/dL — ABNORMAL HIGH (ref 0.0–149.0)
VLDL: 37 mg/dL (ref 0.0–40.0)

## 2022-10-19 LAB — CBC WITH DIFFERENTIAL/PLATELET
Basophils Absolute: 0 10*3/uL (ref 0.0–0.1)
Basophils Relative: 0.7 % (ref 0.0–3.0)
Eosinophils Absolute: 0.2 10*3/uL (ref 0.0–0.7)
Eosinophils Relative: 3.2 % (ref 0.0–5.0)
HCT: 34.8 % — ABNORMAL LOW (ref 36.0–46.0)
Hemoglobin: 11.2 g/dL — ABNORMAL LOW (ref 12.0–15.0)
Lymphocytes Relative: 38.5 % (ref 12.0–46.0)
Lymphs Abs: 2.9 10*3/uL (ref 0.7–4.0)
MCHC: 32.2 g/dL (ref 30.0–36.0)
MCV: 87.4 fl (ref 78.0–100.0)
Monocytes Absolute: 0.6 10*3/uL (ref 0.1–1.0)
Monocytes Relative: 8.3 % (ref 3.0–12.0)
Neutro Abs: 3.6 10*3/uL (ref 1.4–7.7)
Neutrophils Relative %: 49.3 % (ref 43.0–77.0)
Platelets: 279 10*3/uL (ref 150.0–400.0)
RBC: 3.98 Mil/uL (ref 3.87–5.11)
RDW: 14.2 % (ref 11.5–15.5)
WBC: 7.4 10*3/uL (ref 4.0–10.5)

## 2022-10-19 LAB — VITAMIN D 25 HYDROXY (VIT D DEFICIENCY, FRACTURES): VITD: 27.4 ng/mL — ABNORMAL LOW (ref 30.00–100.00)

## 2022-10-19 LAB — VITAMIN B12: Vitamin B-12: >1500 pg/mL — ABNORMAL HIGH (ref 211–911)

## 2022-10-19 MED ORDER — DICYCLOMINE HCL 10 MG PO CAPS: 10.0000 mg | ORAL_CAPSULE | Freq: Three times a day (TID) | ORAL | 1 refills | Status: DC

## 2022-10-19 MED ORDER — TRAMADOL HCL 50 MG PO TABS: 50.0000 mg | ORAL_TABLET | Freq: Three times a day (TID) | ORAL | 1 refills | Status: DC

## 2022-10-19 MED ORDER — LOSARTAN POTASSIUM-HCTZ 50-12.5 MG PO TABS: 0.5000 | ORAL_TABLET | Freq: Every day | ORAL | 1 refills | Status: DC

## 2022-10-19 MED ORDER — LORAZEPAM 0.5 MG PO TABS: 0.5000 mg | ORAL_TABLET | Freq: Two times a day (BID) | ORAL | 1 refills | Status: DC | PRN

## 2022-10-19 MED ORDER — DULOXETINE HCL 60 MG PO CPEP: 60.0000 mg | ORAL_CAPSULE | Freq: Every day | ORAL | 1 refills | Status: DC

## 2022-10-19 MED ORDER — TRAZODONE HCL 100 MG PO TABS: 50.0000 mg | ORAL_TABLET | Freq: Every day | ORAL | 1 refills | Status: DC

## 2022-10-19 MED ORDER — ESCITALOPRAM OXALATE 20 MG PO TABS: 20.0000 mg | ORAL_TABLET | Freq: Every day | ORAL | 1 refills | Status: DC

## 2022-10-19 NOTE — Patient Instructions (Addendum)
Return in about 24 weeks (around 04/05/2023).        Great to see you today.  I have refilled the medication(s) we provide.   If labs were collected, we will inform you of lab results once received either by echart message or telephone call.   - echart message- for normal results that have been seen by the patient already.   - telephone call: abnormal results or if patient has not viewed results in their echart.

## 2022-10-19 NOTE — Progress Notes (Signed)
Patient ID: [name] [name], female  DOB: [birth year], 77 y.o.   MRN: [id number] Patient Care Team    Relationship Specialty Notifications Start End  Ma Hillock, DO PCP - General Family Medicine  06/02/15   Gatha Mayer, MD Consulting Physician Gastroenterology  11/24/15   Calvert Cantor, MD Consulting Physician Ophthalmology  11/24/15   Specialists, [facility]  Orthopedic Surgery  04/12/16   Pa, [facility]    01/16/18   Rheumatology, [facility]    01/16/18   Kathrynn Ducking, MD (Inactive) Consulting Physician Neurology  10/24/18     Chief Complaint  Patient presents with   Hypertension    [facility]; pt is fasting     Subjective: [name] [name] is a 78 y.o.  female present for [facility] All past medical history, surgical history, allergies, family history, immunizations, medications and social history were updated in the electronic medical record today. All recent labs, ED visits and hospitalizations within the last year were reviewed.  Hypertension/hyperlipidemia/CKD3:  Pt reports compliance with Pravastatin 40 mg, losartan-HCTZ 50-12.5 mg daily mg and atenolol 12.5 mg.  Patient denies chest pain, shortness of breath, dizziness or lower extremity edema.  Pt does not take daily baby ASA. Pt is  prescribed statin. Labs completed today Diet: Low-sodium Exercise: Attempts RF: Hypertension, hyperlipidemia. Family history of heart disease, former smoker, obesity   Anxiety/panic attack/insomnia: Patient reports compliance with lexapro 20 mg daily, Cymbalta day, trazodone 100 mg nightly. She had been on zoloft and tapered off when lexapro started. Cymbalta has been at maximum dose and likely chosen secondary to her lumbar/arthritis issues.   She does take  Ativan twice daily.She feels her mental health is doing well on current regimen.     Arthritis/pain/lumbar pain with radiation down right leg: Patient reports plans with tramadol 50 mg twice a day when necessary  for arthritic pain.She suffers from low back pain .  She has h/o anterior cervical decompression/discectomy in the past. She reports her arthritis pain has been very bad the last month.  Indication for chronic opioid: Moderate to severe arthritis and osteoporosis discomfort Medication and dose: Tramadol 50 mg TID  # pills per: #60 Last UDS date: UTD Pain contract signed (Y/N): Yes Date narcotic database last reviewed (include red flags): 10/19/22 She has been exercising, performing PT twice a week. She feels her balance is better.       10/19/2022    8:29 AM 10/13/2022    9:04 AM 05/04/2022   10:36 AM 11/18/2021    9:40 AM 10/30/2021    9:28 AM  Depression screen PHQ 2/9  Decreased Interest '2 3 3 2 1  '$ Down, Depressed, Hopeless '2 2 2 1 '$ 0  PHQ - 2 Score '4 5 5 3 1  '$ Altered sleeping '1 1 1 3 2  '$ Tired, decreased energy '3  3 3 2  '$ Change in appetite 2  0 0 1  Feeling bad or failure about yourself  1  0 0 0  Trouble concentrating 0  1 0 0  Moving slowly or fidgety/restless 0  0 0 0  Suicidal thoughts 0  0 0 0  PHQ-9 Score '11 6 10 9 6      '$ 10/19/2022    8:29 AM 05/04/2022   10:36 AM 11/18/2021    9:41 AM 10/30/2021    9:32 AM  GAD 7 : Generalized Anxiety Score  Nervous, Anxious, on Edge '3 1 2 '$ 0  Control/stop worrying 3  1 0 2  Worry too much - different things '2 2 2 2  '$ Trouble relaxing '1 2 2 2  '$ Restless 2 0 0 0  Easily annoyed or irritable 0 0 0 0  Afraid - awful might happen 0 '1 1 1  '$ Total GAD 7 Score '11 7 7 7          '$ 10/13/2022    9:07 AM 10/26/2021    3:02 PM 09/16/2021   10:24 AM 02/04/2021    9:20 AM 07/22/2020   11:33 AM  Fall Risk   Falls in the past year? '1 1 1 1 1  '$ Number falls in past yr: 0 0 1 0 1  Injury with Fall? 0 0 0 0 0  Risk for fall due to :   Impaired vision;Impaired balance/gait;Impaired mobility Impaired balance/gait   Follow up Falls evaluation completed  Falls prevention discussed Falls evaluation completed Falls evaluation completed    Immunization  History  Administered Date(s) Administered   Fluad Quad(high Dose 65+) 07/04/2022   Influenza Split 07/02/2011   Influenza Whole 07/16/2008, 07/04/2009, 07/14/2010   Influenza, High Dose Seasonal PF 07/04/2018, 06/20/2019   Influenza,inj,Quad PF,6+ Mos 06/29/2013, 07/08/2014, 06/23/2017   Influenza-Unspecified 07/02/2015, 06/22/2019, 04/30/2021   Moderna Sars-Covid-2 Vaccination 12/20/2019, 01/17/2020, 05/23/2020, 04/30/2021   Pneumococcal Conjugate-13 05/15/2014   Pneumococcal Polysaccharide-23 10/04/2006, 09/11/2015   Td 03/19/2010   Tdap 03/10/2020   Unspecified SARS-COV-2 Vaccination 07/06/2022   Zoster Recombinat (Shingrix) 07/07/2019, 09/05/2019   Zoster, Live 07/25/2015    No results found.  Past Medical History:  Diagnosis Date   Anxiety    Arthritis    oa   At moderate risk for fall 02/04/2021   Atypical lobular hyperplasia [facility]) of left breast 12/06/2019   Body mass index (BMI) 45.0-49.9, adult ([place]) 03/26/2021   Cardiac murmur 08/06/2020   Chronic cystitis    CKD (chronic kidney disease) stage 3, GFR 30-59 ml/min (HCC) 04/13/2017   Cystic thyroid nodule 08/09/2018   7x10 mm, right lobe   DDD (degenerative disc disease), cervical    Depression with anxiety 11/04/2007   Current med: xanax PRN, zoloft 100 mg qd.     Dysphagia 07/28/2018   Essential hypertension 11/04/2007   Current meds: lisinopril/HCTZ 10/12.5 mg qd; tenormin 25 mg qd     Fibromyalgia    Gait instability 01/22/2019   GERD (gastroesophageal reflux disease)    Heart murmur    History of duodenal ulcer    2007   History of gastritis    2007   Hoarseness 08/22/2015   GI- did not feel gerd, ENT did not feel ent, neuro- felt possibly ent--> sent for 2nd opinion ent.    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 This patient is considered osteoporotic according to [facility]) criteria. L-1, L-4 were excluded due to degenerative changes. Per the official positions of the ISCD, it is not possible to quantitatively compare BMD or calculate  an [facility] between exams done at different facilities.    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 MRI scheduled. 04/16/2016    Seasonal allergies 02/19/2016   Unspecified venous (peripheral) insufficiency    wears compression hose   Urine incontinence    Vitamin D deficiency 07/20/2008   Taking 1000 iu qd     Vitamin D deficiency, unspecified    Allergies  Allergen Reactions   Meloxicam Swelling    Caused edema and Cr increase   Morphine Nausea And Vomiting    SEVERE   Sulfa Antibiotics Swelling   Past Surgical History:  Procedure Laterality Date   ANTERIOR AND POSTERIOR VAGINAL REPAIR  04/29/2004   AND TRANSVAGINAL TAPE SLING   ANTERIOR CERVICAL DECOMP/DISCECTOMY FUSION  1999   APPENDECTOMY  1984   BILATERAL SALPINGOOPHORECTOMY  1984   Bladder stimulator     BREAST BIOPSY Right 03/2021   BREAST LUMPECTOMY WITH RADIOACTIVE SEED LOCALIZATION Left 10/31/2019   Procedure: LEFT BREAST LUMPECTOMY WITH RADIOACTIVE SEED LOCALIZATION;  Surgeon: Erroll Luna, MD;  Location: [place];  Service: General;  Laterality: Left;   CARDIAC CATHETERIZATION  10-03-2002   DR Dannielle Burn    NORMAL CORONARIES ARTERIES/  EF 55%   CYSTOSCOPY N/A 08/27/2013   Procedure: CYSTOSCOPY;  Surgeon: Ailene Rud, MD;  Location: [facility];  Service: Urology;  Laterality: N/A;   CYSTOSCOPY WITH INJECTION N/A 12/17/2013   Procedure: Ascencion Dike WITH INJECTION;  Surgeon: Ailene Rud, MD;  Location: [facility];  Service: Urology;  Laterality: N/A;   DILATION AND CURETTAGE OF UTERUS     EXCISION RIGHT NECK AND LEFT BREAST SEBACEOUS CYST  05/18/2002   HEMORRHOID BANDING  2014   PUBOVAGINAL SLING N/A 10/25/2016   Procedure: Gaynelle Arabian;  Surgeon: Carolan Clines, MD;  Location: [facility];  Service: Urology;  Laterality: N/A;   right litlle finger surgery  yrs ago   cyst removed x 4   TOTAL ABDOMINAL HYSTERECTOMY  1986   TUBAL LIGATION     VAGINAL PROLAPSE REPAIR N/A 08/27/2013   Procedure: ANTERIOR VAGINAL VAULT SUSPENSION, KELLY PLICATION WITH SACROSPINOUS LIGAMENT FIXATION AND XENFORM BOVINE DERMIS GRAFT AUGMENTATION, URETHRAL EXPLORATION, URETHROLYSIS, EXPLANTATION OF TVT TAPE, IMPLANTATION OF FLOSEAL, IMPLANTATION OF XENFORM BOVINE GRAFT IN PERIURETHRAL SPACE;  Surgeon: Ailene Rud, MD;  Location: [place];  Service: Urology;  Laterality: N/A   VAGINAL PROLAPSE REPAIR N/A 10/25/2016   Procedure: VAGINAL VAULT SUSPENSION with mesh and sacrospinous repair;  Surgeon: Carolan Clines, MD;  Location: [facility];  Service: Urology;  Laterality: N/A;   Family History  Problem Relation Age of Onset   Hypertension Mother    Heart disease Mother    Myelodysplastic syndrome Mother    Arthritis Mother    Cervical cancer Mother    Lung cancer Father    Coronary artery disease Father    Fibromyalgia Sister    Hypertension Sister    Anemia Sister    Depression Sister    Pancreatic cancer Sister        died 05-Feb-202363 mos after dx   Kidney disease Sister        kidney stones, removed kidney   Hypertension  Brother    Anemia Brother  Hypertension Brother    Hyperlipidemia Brother    Dementia Maternal Aunt    Dementia Maternal Aunt    Heart disease Maternal Uncle    Lung cancer Maternal Uncle    Lung cancer Paternal Uncle    Other Paternal Uncle        brain Tumor   Pancreatic cancer Cousin        maternal   Colon polyps Other        aunt   Colon cancer Neg Hx    Social History   Social History Narrative   She is retired and divorced and has 1 child   Occasional alcohol, former smoker no drug use   Right handed    Caffeine 2-3 cups per day   Lives at alone     Allergies as of 10/19/2022       Reactions   Meloxicam Swelling   Caused edema and Cr increase   Morphine Nausea And Vomiting   SEVERE   Sulfa Antibiotics Swelling        Medication List        Accurate as of October 19, 2022  9:40 AM. If you have any questions, ask your nurse or doctor.          atenolol 25 MG tablet Commonly known as: TENORMIN Take 0.5 tablets (12.5 mg total) by mouth daily.   calcium carbonate 600 MG Tabs tablet Commonly known as: OS-CAL Take 600 mg by mouth daily.   cetirizine 10 MG tablet Commonly known as: ZYRTEC Take 10 mg by mouth daily as needed for allergies.   cholecalciferol 25 MCG (1000 UNIT) tablet Commonly known as: VITAMIN D3 Take 1,000 Units by mouth daily.   dicyclomine 10 MG capsule Commonly known as: BENTYL Take 1 capsule (10 mg total) by mouth 4 (four) times daily -  before meals and at bedtime.   DULoxetine 60 MG capsule Commonly known as: CYMBALTA Take 1 capsule (60 mg total) by mouth daily.   escitalopram 20 MG tablet Commonly known as: LEXAPRO Take 1 tablet (20 mg total) by mouth daily.   fesoterodine 8 MG Tb24 tablet Commonly known as: TOVIAZ   FISH OIL PO Take 1 capsule by mouth daily.   fluticasone 50 MCG/ACT nasal spray Commonly known as: FLONASE Place 1 spray into both nostrils as needed for allergies.   LORazepam 0.5 MG  tablet Commonly known as: ATIVAN Take 1 tablet (0.5 mg total) by mouth every 12 (twelve) hours as needed for anxiety (for panic attack).   losartan-hydrochlorothiazide 50-12.5 MG tablet Commonly known as: HYZAAR Take 0.5 tablets by mouth daily.   omeprazole 40 MG capsule Commonly known as: PRILOSEC Take 1 capsule (40 mg total) by mouth 2 (two) times daily before a meal. 30 mins before breakfast and supper   pravastatin 40 MG tablet Commonly known as: PRAVACHOL Take 1 tablet (40 mg total) by mouth daily.   tamoxifen 20 MG tablet Commonly known as: NOLVADEX Take 20 mg by mouth daily.   traMADol 50 MG tablet Commonly known as: ULTRAM Take 1 tablet (50 mg total) by mouth every 8 (eight) hours. What changed:  when to take this reasons to take this Changed by: Howard Pouch, DO   traZODone 100 MG tablet Commonly known as: DESYREL Take 0.5-1 tablets (50-100 mg total) by mouth at bedtime.   Turmeric Curcumin 500 MG Caps Take 1,000 mg by mouth daily.   Vitamin B-12 1000 MCG Subl Place 1 tablet under the tongue daily.  WOMENS MULTIVITAMIN PLUS PO Take 1 tablet by mouth daily.        All past medical history, surgical history, allergies, family history, immunizations andmedications were updated in the EMR today and reviewed under the history and medication portions of their EMR.      ROS: 14 pt review of systems performed and negative (unless mentioned in an HPI)  Objective: BP 133/76   Pulse 67   Temp 97.8 F (36.6 C) (Oral)   Ht 5' 5.5" (1.664 m)   Wt 205 lb (93 kg)   SpO2 96%   BMI 33.59 kg/m  Physical Exam Vitals and nursing note reviewed.  Constitutional:      General: She is not in acute distress.    Appearance: Normal appearance. She is not ill-appearing, toxic-appearing or diaphoretic.  HENT:     Head: Normocephalic and atraumatic.  Eyes:     General: No scleral icterus.       Right eye: No discharge.        Left eye: No discharge.     Extraocular  Movements: Extraocular movements intact.     Conjunctiva/sclera: Conjunctivae normal.     Pupils: Pupils are equal, round, and reactive to light.  Cardiovascular:     Rate and Rhythm: Normal rate and regular rhythm.  Pulmonary:     Effort: Pulmonary effort is normal. No respiratory distress.     Breath sounds: Normal breath sounds. No wheezing, rhonchi or rales.  Musculoskeletal:     Right lower leg: No edema.     Left lower leg: No edema.  Skin:    General: Skin is warm.     Findings: No rash.  Neurological:     Mental Status: She is alert and oriented to person, place, and time. Mental status is at baseline.     Motor: No weakness.     Gait: Gait normal.  Psychiatric:        Mood and Affect: Mood normal.        Behavior: Behavior normal.        Thought Content: Thought content normal.        Judgment: Judgment normal.    Assessment/plan: [name] [name] is a 77 y.o. female present for [facility] Depression with anxiety/insomnia/panic d/o/benzo use Stable Continue Lexapro 20 mg daily Continue Cymbalta 60 mg qd Continue trazodone 100 mg daily at bedtime. Continue Ativan provided for 6 months. NCCS database reviewed and appropriate 10/19/22 - UDS - UTD - Controlled substance contract signed. - pt declined psychology referral.    Hypertension/morbid obesity/hyperlipidemia//palpitations: Stable Continue losartan-HCTZ (1/2 tab) Continue atenolol to 12.5 mg daily. If palpitations worsen, can return to atenolol 25 mg daily.   Continue statin - diet and exercise modifications recommended.   CKD3: Renal dose medications.  Avoid nsaids  hydrate  Vitamin D deficiency/osteoporosis:  Continue vit d supplement reclast - last tx d/t to 5 yr >01/2021  Arthritis/pain/lumbar pain: Stable increased tramadol to TID, for worsening chronic arthritic pain. ) - UDS UTD - contract up-to-date. - NCCS database reviewed 10/19/22 and appropriate.  - Follow-up in 5.5 months if needing  prescription refilled.   Atypical lobular hyperplasia ([facility]) of left breast Following with surgical team  Pernicious anemia Continue B12- she switched to pill format recently> encouraged her to switch back to sublingual for best absorption.   Other irritable bowel syndrome Stable Continue Bentyl as needed  Fatigue/weakness with exertion: New complaint.  Iron panel added to labs today.  Cardio referral to r/o  cardiac cause  No follow-ups on file.  Orders Placed This Encounter  Procedures   CBC w/Diff   Vitamin D (25 hydroxy)   Comp Met (CMET)   TSH   Lipid panel   B12   Iron, TIBC and Ferritin Panel   Ambulatory referral to Cardiology    Meds ordered this encounter  Medications   dicyclomine (BENTYL) 10 MG capsule    Sig: Take 1 capsule (10 mg total) by mouth 4 (four) times daily -  before meals and at bedtime.    Dispense:  120 capsule    Refill:  1   DULoxetine (CYMBALTA) 60 MG capsule    Sig: Take 1 capsule (60 mg total) by mouth daily.    Dispense:  90 capsule    Refill:  1   escitalopram (LEXAPRO) 20 MG tablet    Sig: Take 1 tablet (20 mg total) by mouth daily.    Dispense:  90 tablet    Refill:  1   losartan-hydrochlorothiazide (HYZAAR) 50-12.5 MG tablet    Sig: Take 0.5 tablets by mouth daily.    Dispense:  45 tablet    Refill:  1   traZODone (DESYREL) 100 MG tablet    Sig: Take 0.5-1 tablets (50-100 mg total) by mouth at bedtime.    Dispense:  90 tablet    Refill:  1   LORazepam (ATIVAN) 0.5 MG tablet    Sig: Take 1 tablet (0.5 mg total) by mouth every 12 (twelve) hours as needed for anxiety (for panic attack).    Dispense:  180 tablet    Refill:  1   traMADol (ULTRAM) 50 MG tablet    Sig: Take 1 tablet (50 mg total) by mouth every 8 (eight) hours.    Dispense:  270 tablet    Refill:  1    Referral Orders         Ambulatory referral to Cardiology       Note is dictated utilizing voice recognition software. Although note has been proof read  prior to signing, occasional typographical errors still can be missed. If any questions arise, please do not hesitate to call for verification.  Electronically signed by: Howard Pouch, DO [place]

## 2022-10-20 ENCOUNTER — Telehealth: Payer: Self-pay | Admitting: Family Medicine

## 2022-10-20 ENCOUNTER — Telehealth: Payer: Self-pay | Admitting: Cardiology

## 2022-10-20 LAB — IRON,TIBC AND FERRITIN PANEL
%SAT: 13 % (calc) — ABNORMAL LOW (ref 16–45)
Ferritin: 12 ng/mL — ABNORMAL LOW (ref 16–288)
Iron: 51 ug/dL (ref 45–160)
TIBC: 381 mcg/dL (calc) (ref 250–450)

## 2022-10-20 MED ORDER — POLYSACCHARIDE IRON COMPLEX 150 MG PO CAPS: ORAL_CAPSULE | ORAL | 1 refills | Status: DC

## 2022-10-20 NOTE — Telephone Encounter (Signed)
Please call patient -Liver and thyroid function are normal - kidneyunction is stable -Blood cell counts and electrolytes are stable -Cholesterol panel is at goal.  With an LDL of 94. -Vitamin D is mildly low at 27, I would encourage [name] [name] to increase [name] [name] vitamin D supplement by 1000 units daily. -[name] [name] B12 levels are good continue current regimen.  -[name] [name] iron levels are low normal  and [name] [name] ferritin (iron storage) are low.   -I would recommend starting an iron supplement 3 times a week on Monday Wednesday Friday.  I have called this into [name] [name] mail-in pharmacy.   -This particular iron is better tolerated the most iron formats, but still has the potential to cause mild constipation.  Therefore, I recommend she take a daily stool softener routinely such as Dulcolax or Senokot before bed.  Also placed a referral to cardiology for [name] [name] to be evaluated for [name] [name] increased fatigue with activity.  They should be reaching out to [name] [name] to get [name] [name] scheduled.

## 2022-10-20 NOTE — Telephone Encounter (Signed)
Spoke with pt regarding labs and instructions.   

## 2022-10-20 NOTE — Telephone Encounter (Signed)
Patient would like to switch from [name] to Dr. Johney Frame

## 2022-11-01 ENCOUNTER — Other Ambulatory Visit: Payer: Self-pay | Admitting: Family Medicine

## 2022-11-03 DIAGNOSIS — M25552 Pain in left hip: Secondary | ICD-10-CM | POA: Diagnosis not present

## 2022-11-03 DIAGNOSIS — M797 Fibromyalgia: Secondary | ICD-10-CM | POA: Diagnosis not present

## 2022-11-03 DIAGNOSIS — Z6841 Body Mass Index (BMI) 40.0 and over, adult: Secondary | ICD-10-CM | POA: Diagnosis not present

## 2022-11-03 DIAGNOSIS — M810 Age-related osteoporosis without current pathological fracture: Secondary | ICD-10-CM | POA: Diagnosis not present

## 2022-11-03 DIAGNOSIS — M1991 Primary osteoarthritis, unspecified site: Secondary | ICD-10-CM | POA: Diagnosis not present

## 2022-11-12 DIAGNOSIS — H40022 Open angle with borderline findings, high risk, left eye: Secondary | ICD-10-CM | POA: Diagnosis not present

## 2022-11-12 DIAGNOSIS — H35342 Macular cyst, hole, or pseudohole, left eye: Secondary | ICD-10-CM | POA: Diagnosis not present

## 2022-11-12 DIAGNOSIS — H43813 Vitreous degeneration, bilateral: Secondary | ICD-10-CM | POA: Diagnosis not present

## 2022-11-12 DIAGNOSIS — H17823 Peripheral opacity of cornea, bilateral: Secondary | ICD-10-CM | POA: Diagnosis not present

## 2022-11-19 ENCOUNTER — Ambulatory Visit (INDEPENDENT_AMBULATORY_CARE_PROVIDER_SITE_OTHER): Payer: Medicare HMO | Admitting: Family Medicine

## 2022-11-19 DIAGNOSIS — Z79899 Other long term (current) drug therapy: Secondary | ICD-10-CM | POA: Insufficient documentation

## 2022-11-19 DIAGNOSIS — Z91199 Patient's noncompliance with other medical treatment and regimen due to unspecified reason: Secondary | ICD-10-CM

## 2022-11-19 NOTE — Progress Notes (Unsigned)
  Same day cancel

## 2022-12-07 ENCOUNTER — Ambulatory Visit (INDEPENDENT_AMBULATORY_CARE_PROVIDER_SITE_OTHER): Payer: Medicare HMO | Admitting: Family Medicine

## 2022-12-07 ENCOUNTER — Encounter: Payer: Self-pay | Admitting: Family Medicine

## 2022-12-07 VITALS — BP 136/86 | HR 80 | Temp 98.2°F | Ht 66.0 in | Wt 202.2 lb

## 2022-12-07 DIAGNOSIS — Z0000 Encounter for general adult medical examination without abnormal findings: Secondary | ICD-10-CM | POA: Diagnosis not present

## 2022-12-07 DIAGNOSIS — I10 Essential (primary) hypertension: Secondary | ICD-10-CM

## 2022-12-07 DIAGNOSIS — N1832 Chronic kidney disease, stage 3b: Secondary | ICD-10-CM | POA: Diagnosis not present

## 2022-12-07 DIAGNOSIS — E781 Pure hyperglyceridemia: Secondary | ICD-10-CM | POA: Diagnosis not present

## 2022-12-07 DIAGNOSIS — Z1231 Encounter for screening mammogram for malignant neoplasm of breast: Secondary | ICD-10-CM

## 2022-12-07 DIAGNOSIS — E559 Vitamin D deficiency, unspecified: Secondary | ICD-10-CM | POA: Diagnosis not present

## 2022-12-07 DIAGNOSIS — Z79899 Other long term (current) drug therapy: Secondary | ICD-10-CM

## 2022-12-07 LAB — CBC
HCT: 35.8 % — ABNORMAL LOW (ref 36.0–46.0)
Hemoglobin: 11.6 g/dL — ABNORMAL LOW (ref 12.0–15.0)
MCHC: 32.4 g/dL (ref 30.0–36.0)
MCV: 86.5 fl (ref 78.0–100.0)
Platelets: 286 10*3/uL (ref 150.0–400.0)
RBC: 4.14 Mil/uL (ref 3.87–5.11)
RDW: 14 % (ref 11.5–15.5)
WBC: 8.2 10*3/uL (ref 4.0–10.5)

## 2022-12-07 LAB — HEMOGLOBIN A1C: Hgb A1c MFr Bld: 5.4 % (ref 4.6–6.5)

## 2022-12-07 NOTE — Patient Instructions (Addendum)
Return in about 23 weeks (around 05/17/2023) for Routine chronic condition follow-up.        Great to see you today.  I have refilled the medication(s) we provide.   If labs were collected, we will inform you of lab results once received either by echart message or telephone call.   - echart message- for normal results that have been seen by the patient already.   - telephone call: abnormal results or if patient has not viewed results in their echart.

## 2022-12-07 NOTE — Progress Notes (Signed)
Patient ID: [name] [name], female  DOB: 1945-10-31, 77 y.o.   MRN: [id number] Patient Care Team    Relationship Specialty Notifications Start End  Ma Hillock, DO PCP - General Family Medicine  06/02/15   Gatha Mayer, MD Consulting Physician Gastroenterology  11/24/15   Calvert Cantor, MD Consulting Physician Ophthalmology  11/24/15   Specialists, [facility]  Orthopedic Surgery  04/12/16   Pa, [facility]    01/16/18   Rheumatology, [facility]    01/16/18   Kathrynn Ducking, MD (Inactive) Consulting Physician Neurology  10/24/18     Chief Complaint  Patient presents with   Annual Exam    Subjective: [name] [name] is a 77 y.o.  Female  present for CPE. All past medical history, surgical history, allergies, family history, immunizations, medications and social history were updated in the electronic medical record today. All recent labs, ED visits and hospitalizations within the last year were reviewed.  Health maintenance:  Mammogram (50-74): Completed 02/19/2022.  Ordered mammogram for breast Center in June 2024 Immunizations: Influenza vaccination up-to-date 23, tetanus vaccination up-to-date 2021, pneumonia vaccinations completed.  Shingrix completed  Infectious disease screening: hep c completed DEXA: Completed 05/2022- osteoporosis. Reclast  Assistive device: none Oxygen [id number] Patient has a Dental home. Hospitalizations/ED visits: reviewed  Colonoscopy: no further screenings recommended by GI- 03/2016- Dr. Carlean Purl.  PAP > 65. No longer indicated.      12/07/2022    9:37 AM 10/19/2022    8:29 AM 10/13/2022    9:04 AM 05/04/2022   10:36 AM 11/18/2021    9:40 AM  Depression screen PHQ 2/9  Decreased Interest '2 2 3 3 2  '$ Down, Depressed, Hopeless '2 2 2 2 1  '$ PHQ - 2 Score '4 4 5 5 3  '$ Altered sleeping '3 1 1 1 3  '$ Tired, decreased energy '3 3  3 3  '$ Change in appetite 0 2  0 0  Feeling bad or failure about yourself  0 1  0 0  Trouble  concentrating 0 0  1 0  Moving slowly or fidgety/restless 0 0  0 0  Suicidal thoughts 0 0  0 0  PHQ-9 Score '10 11 6 10 9  '$ Difficult doing work/chores Somewhat difficult          12/07/2022    9:37 AM 10/19/2022    8:29 AM 05/04/2022   10:36 AM 11/18/2021    9:41 AM  GAD 7 : Generalized Anxiety Score  Nervous, Anxious, on Edge 0 '3 1 2  '$ Control/stop worrying '3 3 1 '$ 0  Worry too much - different things '3 2 2 2  '$ Trouble relaxing '1 1 2 2  '$ Restless 2 2 0 0  Easily annoyed or irritable 0 0 0 0  Afraid - awful might happen 0 0 1 1  Total GAD 7 Score '9 11 7 7    '$ Immunization History  Administered Date(s) Administered   Fluad Quad(high Dose 65+) 07/04/2022   Influenza Split 07/02/2011   Influenza Whole 07/16/2008, 07/04/2009, 07/14/2010   Influenza, High Dose Seasonal PF 07/04/2018, 06/20/2019   Influenza,inj,Quad PF,6+ Mos 06/29/2013, 07/08/2014, 06/23/2017   Influenza-Unspecified 07/02/2015, 06/22/2019, 04/30/2021   Moderna Sars-Covid-2 Vaccination 12/20/2019, 01/17/2020, 05/23/2020, 04/30/2021   Pneumococcal Conjugate-13 05/15/2014   Pneumococcal Polysaccharide-23 10/04/2006, 09/11/2015   Td 03/19/2010   Tdap 03/10/2020   Unspecified SARS-COV-2 Vaccination 07/06/2022   Zoster Recombinat (Shingrix) 07/07/2019, 09/05/2019   Zoster, Live 07/25/2015   Past Medical History:  Diagnosis Date   Anxiety    Arthritis    oa   At moderate risk for fall 02/04/2021   Atypical lobular hyperplasia [facility]) of left breast 12/06/2019   Body mass index (BMI) 45.0-49.9, adult ([place]) 03/26/2021   Cardiac murmur 08/06/2020   Chronic cystitis    CKD (chronic kidney disease) stage 3, GFR 30-59 ml/min (HCC) 04/13/2017   Cystic thyroid nodule 08/09/2018   7x10 mm, right lobe   DDD (degenerative disc disease), cervical    Depression with anxiety 11/04/2007   Current med: xanax PRN, zoloft 100 mg qd.     Dysphagia 07/28/2018   Essential hypertension 11/04/2007   Current meds: lisinopril/HCTZ 10/12.5 mg qd;  tenormin 25 mg qd     Fibromyalgia    Gait instability 01/22/2019   GERD (gastroesophageal reflux disease)    Heart murmur    History of duodenal ulcer    2007   History of gastritis    2007   Hoarseness 08/22/2015   GI- did not feel gerd, ENT did not feel ent, neuro- felt possibly ent--> sent for 2nd opinion ent.    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 This patient is considered osteoporotic according to [facility]) criteria. L-1, L-4 were excluded due to degenerative changes. Per the official positions of the ISCD, it is not possible to quantitatively compare BMD or calculate an [facility] between exams done at different facilities.    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 MRI scheduled. 04/16/2016    Seasonal allergies 02/19/2016   Unspecified venous (peripheral) insufficiency    wears compression hose   Urine incontinence    Vitamin D deficiency 07/20/2008   Taking 1000 iu qd     Vitamin D deficiency, unspecified    Allergies  Allergen Reactions   Meloxicam Swelling    Caused edema and Cr increase   Morphine Nausea And Vomiting    SEVERE   Sulfa Antibiotics Swelling   Past Surgical History:  Procedure  Laterality Date   ANTERIOR AND POSTERIOR VAGINAL REPAIR  04/29/2004   AND TRANSVAGINAL TAPE SLING   ANTERIOR CERVICAL DECOMP/DISCECTOMY FUSION  1999   APPENDECTOMY  1984   BILATERAL SALPINGOOPHORECTOMY  1984   Bladder stimulator     BREAST BIOPSY Right 03/2021   BREAST LUMPECTOMY WITH RADIOACTIVE SEED LOCALIZATION Left 10/31/2019   Procedure: LEFT BREAST LUMPECTOMY WITH RADIOACTIVE SEED LOCALIZATION;  Surgeon: Erroll Luna, MD;  Location: [place];  Service: General;  Laterality: Left;   CARDIAC CATHETERIZATION  10-03-2002   DR Dannielle Burn   NORMAL CORONARIES ARTERIES/  EF 55%   CYSTOSCOPY N/A 08/27/2013   Procedure: CYSTOSCOPY;  Surgeon: Ailene Rud, MD;  Location: [place];  Service: Urology;  Laterality: N/A;   CYSTOSCOPY WITH INJECTION N/A 12/17/2013   Procedure: Ascencion Dike WITH INJECTION;  Surgeon: Ailene Rud, MD;  Location: [facility];  Service: Urology;  Laterality: N/A;   DILATION AND CURETTAGE OF UTERUS     EXCISION RIGHT NECK AND LEFT BREAST SEBACEOUS CYST  05/18/2002   HEMORRHOID BANDING  2014   PUBOVAGINAL SLING N/A 10/25/2016   Procedure: Gaynelle Arabian;  Surgeon: Carolan Clines, MD;  Location: [facility];  Service: Urology;  Laterality: N/A;   right litlle finger surgery  yrs ago   cyst removed x 4   TOTAL ABDOMINAL HYSTERECTOMY  1986   TUBAL LIGATION     VAGINAL PROLAPSE REPAIR N/A 08/27/2013   Procedure: ANTERIOR VAGINAL VAULT SUSPENSION, KELLY PLICATION WITH SACROSPINOUS LIGAMENT FIXATION AND XENFORM BOVINE DERMIS GRAFT AUGMENTATION, URETHRAL EXPLORATION, URETHROLYSIS, EXPLANTATION OF TVT TAPE, IMPLANTATION OF FLOSEAL, IMPLANTATION OF XENFORM BOVINE GRAFT IN PERIURETHRAL SPACE;  Surgeon: Ailene Rud, MD;  Location: [place];  Service: Urology;  Laterality: N/A   VAGINAL PROLAPSE REPAIR N/A 10/25/2016   Procedure: VAGINAL VAULT SUSPENSION with mesh and sacrospinous  repair;  Surgeon: Carolan Clines, MD;  Location: [facility];  Service: Urology;  Laterality: N/A;   Family History  Problem Relation Age of Onset   Hypertension Mother    Heart disease Mother    Myelodysplastic syndrome Mother    Arthritis Mother    Cervical cancer Mother    Lung cancer Father    Coronary artery disease Father    Fibromyalgia Sister    Hypertension Sister    Anemia Sister    Depression Sister    Pancreatic cancer Sister        died 2023-10-3138 mos after dx   Kidney disease Sister        kidney stones, removed kidney   Hypertension Brother    Anemia Brother    Hypertension Brother    Hyperlipidemia Brother    Dementia Maternal Aunt    Dementia Maternal Aunt    Heart disease Maternal Uncle    Lung cancer Maternal Uncle    Lung cancer Paternal Uncle    Other Paternal Uncle        brain Tumor   Pancreatic cancer Cousin        maternal   Colon polyps Other        aunt   Colon cancer Neg Hx    Social History   Social History Narrative   She is retired and divorced and has 1 child   Occasional alcohol, former smoker no drug use   Right handed    Caffeine 2-3 cups per day   Lives at alone     Allergies as of 12/07/2022       Reactions   Meloxicam Swelling   Caused edema and Cr increase   Morphine Nausea And Vomiting   SEVERE   Sulfa Antibiotics Swelling        Medication List        Accurate as of December 07, 2022  9:59 AM. If you have any questions, ask your nurse or doctor.          atenolol 25 MG tablet Commonly known as: TENORMIN Take 0.5 tablets (12.5 mg total) by mouth daily.   calcium carbonate 600 MG Tabs tablet Commonly known as: OS-CAL Take 600 mg by mouth daily.   Cequa 0.09 % Soln Generic drug: cycloSPORINE (PF) Apply 1 drop to eye 2 (  two) times daily.   cetirizine 10 MG tablet Commonly known as: ZYRTEC Take 10 mg by mouth daily as needed for allergies.   cholecalciferol 25 MCG (1000 UNIT) tablet Commonly known as:  VITAMIN D3 Take 1,000 Units by mouth daily.   dicyclomine 10 MG capsule Commonly known as: BENTYL Take 1 capsule (10 mg total) by mouth 4 (four) times daily -  before meals and at bedtime.   DULoxetine 60 MG capsule Commonly known as: CYMBALTA Take 1 capsule (60 mg total) by mouth daily.   escitalopram 20 MG tablet Commonly known as: LEXAPRO Take 1 tablet (20 mg total) by mouth daily.   fesoterodine 8 MG Tb24 tablet Commonly known as: TOVIAZ   FISH OIL PO Take 1 capsule by mouth daily.   fluticasone 50 MCG/ACT nasal spray Commonly known as: FLONASE Place 1 spray into both nostrils as needed for allergies.   iron polysaccharides 150 MG capsule Commonly known as: NIFEREX 1 tab p.o. Monday, Wednesday and Friday.   latanoprost 0.005 % ophthalmic solution Commonly known as: XALATAN Place 1 drop into the left eye at bedtime.   LORazepam 0.5 MG tablet Commonly known as: ATIVAN Take 1 tablet (0.5 mg total) by mouth every 12 (twelve) hours as needed for anxiety (for panic attack).   losartan-hydrochlorothiazide 50-12.5 MG tablet Commonly known as: HYZAAR Take 0.5 tablets by mouth daily.   omeprazole 40 MG capsule Commonly known as: PRILOSEC Take 1 capsule (40 mg total) by mouth 2 (two) times daily before a meal. 30 mins before breakfast and supper   pravastatin 40 MG tablet Commonly known as: PRAVACHOL Take 1 tablet (40 mg total) by mouth daily.   tamoxifen 20 MG tablet Commonly known as: NOLVADEX Take 20 mg by mouth daily.   traMADol 50 MG tablet Commonly known as: ULTRAM Take 1 tablet (50 mg total) by mouth every 8 (eight) hours.   traZODone 100 MG tablet Commonly known as: DESYREL Take 0.5-1 tablets (50-100 mg total) by mouth at bedtime.   Turmeric Curcumin 500 MG Caps Take 1,000 mg by mouth daily.   Vitamin B-12 1000 MCG Subl Place 1 tablet under the tongue daily.   WOMENS MULTIVITAMIN PLUS PO Take 1 tablet by mouth daily.        All past medical  history, surgical history, allergies, family history, immunizations andmedications were updated in the EMR today and reviewed under the history and medication portions of their EMR.       ROS 14 pt review of systems performed and negative (unless mentioned in an HPI)  Objective: BP 136/86   Pulse 80   Temp 98.2 F (36.8 C)   Ht '5\' 6"'$  (1.676 m)   Wt 202 lb 3.2 oz (91.7 kg)   SpO2 95%   BMI 32.64 kg/m  Physical Exam Vitals and nursing note reviewed.  Constitutional:      General: She is not in acute distress.    Appearance: Normal appearance. She is not ill-appearing or toxic-appearing.  HENT:     Head: Normocephalic and atraumatic.     Right Ear: Tympanic membrane, ear canal and external ear normal. There is no impacted cerumen.     Left Ear: Tympanic membrane, ear canal and external ear normal. There is no impacted cerumen.     Nose: No congestion or rhinorrhea.     Mouth/Throat:     Mouth: Mucous membranes are moist.     Pharynx: Oropharynx is clear. No oropharyngeal exudate or posterior oropharyngeal erythema.  Eyes:     General: No scleral icterus.       Right eye: No discharge.        Left eye: No discharge.     Extraocular Movements: Extraocular movements intact.     Conjunctiva/sclera: Conjunctivae normal.     Pupils: Pupils are equal, round, and reactive to light.  Cardiovascular:     Rate and Rhythm: Normal rate and regular rhythm.     Pulses: Normal pulses.     Heart sounds: Normal heart sounds. No murmur heard.    No friction rub. No gallop.  Pulmonary:     Effort: Pulmonary effort is normal. No respiratory distress.     Breath sounds: Normal breath sounds. No stridor. No wheezing, rhonchi or rales.  Chest:     Chest wall: No tenderness.  Abdominal:     General: Abdomen is flat. Bowel sounds are normal. There is no distension.     Palpations: Abdomen is soft. There is no mass.     Tenderness: There is no abdominal tenderness. There is no right CVA  tenderness, left CVA tenderness, guarding or rebound.     Hernia: No hernia is present.  Musculoskeletal:        General: No swelling, tenderness or deformity. Normal range of motion.     Cervical back: Normal range of motion and neck supple. No rigidity or tenderness.     Right lower leg: No edema.     Left lower leg: No edema.  Lymphadenopathy:     Cervical: No cervical adenopathy.  Skin:    General: Skin is warm and dry.     Coloration: Skin is not jaundiced or pale.     Findings: No bruising, erythema, lesion or rash.  Neurological:     General: No focal deficit present.     Mental Status: She is alert and oriented to person, place, and time. Mental status is at baseline.     Cranial Nerves: No cranial nerve deficit.     Sensory: No sensory deficit.     Motor: No weakness.     Coordination: Coordination normal.     Gait: Gait normal.     Deep Tendon Reflexes: Reflexes normal.  Psychiatric:        Mood and Affect: Mood normal.        Behavior: Behavior normal.        Thought Content: Thought content normal.        Judgment: Judgment normal.      No results found.  Assessment/plan: SADIYA CUTTINO is a 77 y.o. female present for CPE  Osteoporosis without current pathological fracture, unspecified osteoporosis type Screening up-to-date 2023 Vitamin D UTD 2024  Hypertriglyceridemia/aortic atherosclerosis Lipid panel and TSH  UTD 10/2022  Breast cancer screening by mammogram - MM 3D SCREENING MAMMOGRAM BILATERAL BREAST; Future Essential hypertension - CBC Encounter for long-term current use of medication - Hemoglobin A1c  Routine General preventative exam Patient was encouraged to exercise greater than 150 minutes a week. Patient was encouraged to choose a diet filled with fresh fruits and vegetables, and lean meats. AVS provided to patient today for education/recommendation on gender specific health and safety maintenance. Mammogram (50-74): Completed 02/19/2022.   Ordered mammogram for breast Center in June 2024 Immunizations: Influenza vaccination up-to-date 23, tetanus vaccination up-to-date 2021, pneumonia vaccinations completed.  Shingrix completed  Infectious disease screening: hep c completed DEXA: Completed 05/2022- osteoporosis. Reclast   Return in about 23 weeks (around 05/17/2023) for Routine chronic condition  follow-up.  Orders Placed This Encounter  Procedures   MM 3D SCREENING MAMMOGRAM BILATERAL BREAST   CBC   Hemoglobin A1c    No orders of the defined types were placed in this encounter.  Referral Orders  No referral(s) requested today     Electronically signed by: Howard Pouch, [place]

## 2022-12-09 NOTE — Progress Notes (Deleted)
Cardiology Office Note:    Date:  12/09/2022   ID:  [name] [name], DOB May 30, 1946, MRN [id number]  PCP:  [name] Hillock, DO   [place] Providers Cardiologist:  None   Referring MD: [name] Hillock, DO    History of Present Illness:    [name] [name] is a 77 y.o. female with a hx of aortic atherosclerosis, HLD, CKD III, depression, and HTN who was previously followed by Dr. Geraldo [name] who now presents to clinic for follow-up.  Last visit with Dr. Geraldo [name] in 03/2021 where she was stable from a CV standpoint. Myoview 2021 normal. TTE 2021 with EF 55-60%, G1DD, normal RV, no significant valve disease.  Today, the patient states that she is overall feeling okay. Has these episodes of feeling suddenly lightheaded and SOB and she feels like she is going to pass out. No episodes of syncope. Symptoms can occur at rest or with exertion. Not triggered by exertion. Symptoms worsen with stress and anxiety but can happen when she is not stressed or anxious. No chest pain with exertion, LE edema, orthopnea, or PND.   Notably, she has significant amount of caffeine during her day.   Past Medical History:  Diagnosis Date   Anxiety    Arthritis    oa   At moderate risk for fall 02/04/2021   Atypical lobular hyperplasia [facility]) of left breast 12/06/2019   Body mass index (BMI) 45.0-49.9, adult ([place]) 03/26/2021   Cardiac murmur 08/06/2020   Chronic cystitis    CKD (chronic kidney disease) stage 3, GFR 30-59 ml/min (HCC) 04/13/2017   Cystic thyroid nodule 08/09/2018   7x10 mm, right lobe   DDD (degenerative disc disease), cervical    Depression with anxiety 11/04/2007   Current med: xanax PRN, zoloft 100 mg qd.     Dysphagia 07/28/2018   Essential hypertension 11/04/2007   Current meds: lisinopril/HCTZ 10/12.5 mg qd; tenormin 25 mg qd     Fibromyalgia    Gait instability 01/22/2019   GERD (gastroesophageal reflux disease)    Heart murmur    History of duodenal ulcer    2007   History  of gastritis    2007   Hoarseness 08/22/2015   GI- did not feel gerd, ENT did not feel ent, neuro- felt possibly ent--> sent for 2nd opinion ent.    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 This patient is considered osteoporotic according to [facility]) criteria. L-1, L-4 were excluded due to degenerative changes. Per the official positions of the ISCD, it is not possible to quantitatively compare BMD or calculate an [facility] between exams done at different facilities.    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 MRI scheduled. 04/16/2016  Seasonal allergies 02/19/2016   Unspecified venous (peripheral) insufficiency    wears compression hose   Urine incontinence    Vitamin D deficiency 07/20/2008   Taking 1000 iu qd     Vitamin D deficiency, unspecified     Past Surgical History:  Procedure Laterality Date   ANTERIOR AND POSTERIOR VAGINAL REPAIR  04/29/2004   AND TRANSVAGINAL TAPE SLING   ANTERIOR CERVICAL DECOMP/DISCECTOMY FUSION  1999   APPENDECTOMY  1984   BILATERAL SALPINGOOPHORECTOMY  1984   Bladder stimulator     BREAST BIOPSY Right 03/2021   BREAST LUMPECTOMY WITH RADIOACTIVE SEED LOCALIZATION Left 10/31/2019    Procedure: LEFT BREAST LUMPECTOMY WITH RADIOACTIVE SEED LOCALIZATION;  Surgeon: Erroll Luna, MD;  Location: [place];  Service: General;  Laterality: Left;   CARDIAC CATHETERIZATION  10-03-2002   DR Dannielle Burn   NORMAL CORONARIES ARTERIES/  EF 55%   CYSTOSCOPY N/A 08/27/2013   Procedure: CYSTOSCOPY;  Surgeon: Ailene Rud, MD;  Location: [facility];  Service: Urology;  Laterality: N/A;   CYSTOSCOPY WITH INJECTION N/A 12/17/2013   Procedure: Ascencion Dike WITH INJECTION;  Surgeon: Ailene Rud, MD;  Location: [facility];  Service: Urology;  Laterality: N/A;   DILATION AND CURETTAGE OF UTERUS     EXCISION RIGHT NECK AND LEFT BREAST SEBACEOUS CYST  05/18/2002   HEMORRHOID BANDING  2014   PUBOVAGINAL SLING N/A 10/25/2016   Procedure: Gaynelle Arabian;  Surgeon: Carolan Clines, MD;  Location: [facility];  Service: Urology;  Laterality: N/A;   right litlle finger surgery  yrs ago   cyst removed x 4   TOTAL ABDOMINAL HYSTERECTOMY  1986   TUBAL LIGATION     VAGINAL PROLAPSE REPAIR N/A 08/27/2013   Procedure: ANTERIOR VAGINAL VAULT SUSPENSION, KELLY PLICATION WITH SACROSPINOUS LIGAMENT FIXATION AND XENFORM BOVINE DERMIS GRAFT AUGMENTATION, URETHRAL EXPLORATION, URETHROLYSIS, EXPLANTATION OF TVT TAPE, IMPLANTATION OF FLOSEAL, IMPLANTATION OF XENFORM BOVINE GRAFT IN PERIURETHRAL SPACE;  Surgeon: Ailene Rud, MD;  Location: [place];  Service: Urology;  Laterality: N/A   VAGINAL PROLAPSE REPAIR N/A 10/25/2016   Procedure: VAGINAL VAULT SUSPENSION with mesh and sacrospinous repair;  Surgeon: Carolan Clines, MD;  Location: [facility];  Service: Urology;  Laterality: N/A;    Current Medications: No outpatient medications have been marked as taking for the 12/10/22 encounter (Appointment) with [name] Bergeron, MD.   Current Facility-Administered Medications for the 12/10/22 encounter (Appointment) with  [name] Bergeron, MD  Medication   cyanocobalamin (VITAMIN B12) injection 1,000 mcg     Allergies:   Meloxicam, Morphine, and Sulfa antibiotics   Social History   Socioeconomic History   Marital status: Divorced    Spouse name: Not on file   Number of children: 1   Years of education: Not on file   Highest education level: 12th grade  Occupational History   Occupation: Retired  Tobacco Use   Smoking status: Former    Packs/day: 1.00    Years: 15.00    Total pack years: 15.00    Types: Cigarettes    Start date: 12/06/1993    Quit date: 08/09/2009    Years since quitting: 13.3   Smokeless tobacco: Never   Tobacco comments:    quit smoking 5 years ago  Vaping Use   Vaping Use: Never used  Substance and Sexual Activity   Alcohol use: Not Currently    Comment: ocassional   Drug use: No   Sexual activity: Not Currently  Partners: Male  Other Topics Concern   Not on file  Social History Narrative   She is retired and divorced and has 1 child   Occasional alcohol, former smoker no drug use   Right handed    Caffeine 2-3 cups per day   Lives at alone    Social Determinants of Health   Financial Resource Strain: [place]  (10/13/2022)   Overall Financial Resource Strain (CARDIA)    Difficulty of Paying Living Expenses: Not very hard  Food Insecurity: No Food Insecurity (10/13/2022)   Hunger Vital Sign    Worried About Running Out of Food in the Last Year: Never true    [place] in the Last Year: Never true  Transportation Needs: No Transportation Needs (10/13/2022)   PRAPARE - Hydrologist (Medical): No    Lack of Transportation (Non-Medical): No  Physical Activity: Inactive (10/13/2022)   Exercise Vital Sign    Days of Exercise per Week: 0 days    Minutes of Exercise per Session: 0 min  Stress: No Stress Concern Present (10/13/2022)   [place]    Feeling of  Stress : Only a little  Social Connections: Moderately Isolated (10/13/2022)   Social Connection and Isolation Panel [NHANES]    Frequency of Communication with Friends and Family: More than three times a week    Frequency of Social Gatherings with Friends and Family: Once a week    Attends Religious Services: More than 4 times per year    Active Member of [employer] or Organizations: No    Attends Archivist Meetings: Never    Marital Status: Divorced     Family History: The patient's ***family history includes Anemia in her brother and sister; Arthritis in her mother; Cervical cancer in her mother; Colon polyps in an other family member; Coronary artery disease in her father; Dementia in her maternal aunt and maternal aunt; Depression in her sister; Fibromyalgia in her sister; Heart disease in her maternal uncle and mother; Hyperlipidemia in her brother; Hypertension in her brother, brother, mother, and sister; Kidney disease in her sister; Lung cancer in her father, maternal uncle, and paternal uncle; Myelodysplastic syndrome in her mother; Other in her paternal uncle; Pancreatic cancer in her cousin and sister. There is no history of Colon cancer.  ROS:   Please see the history of present illness.    *** All other systems reviewed and are negative.  EKGs/Labs/Other Studies Reviewed:    The following studies were reviewed today: TTE 09-14-20: IMPRESSIONS     1. Left ventricular ejection fraction, by estimation, is 55 to 60%. The  left ventricle has normal function. The left ventricle has no regional  wall motion abnormalities. There is mild left ventricular hypertrophy.  Left ventricular diastolic parameters  are consistent with Grade I diastolic dysfunction (impaired relaxation).   2. Right ventricular systolic function is normal. The right ventricular  size is normal. Tricuspid regurgitation signal is inadequate for assessing  PA pressure.   3. The mitral valve is normal in  structure. No evidence of mitral valve  regurgitation. No evidence of mitral stenosis.   4. The aortic valve is tricuspid. Aortic valve regurgitation is not  visualized. No aortic stenosis is present.   5. The inferior vena cava is normal in size with greater than 50%  respiratory variability, suggesting right atrial pressure of 3 mmHg.   Myoview 09-14-2020:  Nuclear stress  EF: 64%. There was no ST segment deviation noted during stress. No T wave inversion was noted during stress. The study is normal. This is a low risk study. The left ventricular ejection fraction is normal (55-65%).   1. Mildly reduced apical counts on rest and stress with normal wall motion consistent with apical thinning artifact.  2. Normal study without ischemia or infarction. 3. Normal LVEF, 64%.  4. This is a low-risk study.   EKG:  EKG is *** ordered today.  The ekg ordered today demonstrates ***  Recent Labs: 05/04/2022: Magnesium 1.9 10/19/2022: ALT 9; BUN 17; Creatinine, Ser 1.29; Potassium 5.0; Sodium 143; TSH 4.08 12/07/2022: Hemoglobin 11.6; Platelets 286.0  Recent Lipid Panel    Component Value Date/Time   CHOL 187 10/19/2022 0851   CHOL 165 03/30/2021 0957   TRIG 185.0 (H) 10/19/2022 0851   HDL 56.90 10/19/2022 0851   HDL 53 03/30/2021 0957   CHOLHDL 3 10/19/2022 0851   VLDL 37.0 10/19/2022 0851   LDLCALC 94 10/19/2022 0851   LDLCALC 88 03/30/2021 0957     Risk Assessment/Calculations:   {Does this patient have ATRIAL FIBRILLATION?:[id number]}  No BP recorded.  {Refresh Note OR Click here to enter BP  :1}***         Physical Exam:    VS:  There were no vitals taken for this visit.    Wt Readings from Last 3 Encounters:  12/07/22 202 lb 3.2 oz (91.7 kg)  10/19/22 205 lb (93 kg)  05/04/22 202 lb (91.6 kg)     GEN: *** Well nourished, well developed in no acute distress HEENT: Normal NECK: No JVD; No carotid bruits LYMPHATICS: No lymphadenopathy CARDIAC: ***RRR, no murmurs, rubs,  gallops RESPIRATORY:  Clear to auscultation without rales, wheezing or rhonchi  ABDOMEN: Soft, non-tender, non-distended MUSCULOSKELETAL:  No edema; No deformity  SKIN: Warm and dry NEUROLOGIC:  Alert and oriented x 3 PSYCHIATRIC:  Normal affect   ASSESSMENT:    No diagnosis found. PLAN:    In order of problems listed above:  #Presyncope: Patient having episodes of sudden onset lightheadedness with associated SOB that occur several times per week. Symptoms can happen at rest and with exertion and usually resolve within a couple of minutes. No syncope. Often these episodes correlate with periods of stress/anxiety. Has noticed that they can resolve with coughing. Overall, symptoms concerning for possible arrhythmia vs stress related. She is also having significant caffeine per day and we recommended cutting back. Will check zio monitor for further evaluation. -Check 14 day zio monitor -Cut back on caffeine -Increase hydration  #HTN: Controlled and at goal <130/90. -Continue atenolol 12.'5mg'$  daily -Continue losartan-HCTZ 50-12.'5mg'$  daily  #HLD: -Continue pravastatin '40mg'$  daily -Declined Ca score today  #Obesity with BMI 33: -Discussed lifestyle modifications as below  Exercise recommendations: Goal of exercising for at least 30 minutes a day, at least 5 times per week.  Please exercise to a moderate exertion.  This means that while exercising it is difficult to speak in full sentences, however you are not so short of breath that you feel you must stop, and not so comfortable that you can carry on a full conversation.  Exertion level should be approximately a 5/10, if 10 is the most exertion you can perform.  Diet recommendations: Recommend a heart healthy diet such as the Mediterranean diet.  This diet consists of plant based foods, healthy fats, lean meats, olive oil.  It suggests limiting the intake of simple carbohydrates such as white  breads, pastries, and pastas.  It also limits  the amount of red meat, wine, and dairy products such as cheese that one should consume on a daily basis.      {Are you ordering a CV Procedure (e.g. stress test, cath, DCCV, TEE, etc)?   Press F2        :[id number]    Medication Adjustments/Labs and Tests Ordered: Current medicines are reviewed at length with the patient today.  Concerns regarding medicines are outlined above.  No orders of the defined types were placed in this encounter.  No orders of the defined types were placed in this encounter.   There are no Patient Instructions on file for this visit.   Signed, [name] Bergeron, MD  12/09/2022 1:39 PM    [place]

## 2022-12-10 ENCOUNTER — Telehealth: Payer: Self-pay | Admitting: *Deleted

## 2022-12-10 ENCOUNTER — Ambulatory Visit: Payer: Medicare HMO | Attending: Cardiology | Admitting: Cardiology

## 2022-12-10 ENCOUNTER — Ambulatory Visit: Payer: Medicare HMO

## 2022-12-10 ENCOUNTER — Encounter: Payer: Self-pay | Admitting: Cardiology

## 2022-12-10 VITALS — BP 142/84 | HR 84 | Ht 65.0 in | Wt 201.8 lb

## 2022-12-10 DIAGNOSIS — I10 Essential (primary) hypertension: Secondary | ICD-10-CM

## 2022-12-10 DIAGNOSIS — E782 Mixed hyperlipidemia: Secondary | ICD-10-CM | POA: Diagnosis not present

## 2022-12-10 DIAGNOSIS — R42 Dizziness and giddiness: Secondary | ICD-10-CM | POA: Diagnosis not present

## 2022-12-10 DIAGNOSIS — R002 Palpitations: Secondary | ICD-10-CM

## 2022-12-10 DIAGNOSIS — I700 Atherosclerosis of aorta: Secondary | ICD-10-CM | POA: Diagnosis not present

## 2022-12-10 NOTE — Progress Notes (Signed)
Cardiology Office Note:    Date:  12/10/2022   ID:  [name] [name], DOB Aug 06, 1946, MRN [id number]  PCP:  Ma Hillock, DO   [name] Cardiologist:  None   Referring MD: Ma Hillock, DO    History of Present Illness:    [name] [name] is a 77 y.o. female with a hx of aortic atherosclerosis, HLD, CKD III, depression, and HTN who was previously followed by [name] who now presents to clinic for follow-up.  Last visit with [name] in 03/2021 where she was stable from a CV standpoint. Myoview 2021 normal. TTE 2021 with EF 55-60%, G1DD, normal RV, no significant valve disease.  Today, the patient states that she is overall feeling okay. Has been having episodes of feeling suddenly lightheaded and SOB and she feels like she is going to pass out. No episodes of syncope. Symptoms can occur at rest or with exertion.  Also worsen with stress and anxiety but can happen when she is not stressed or anxious. States symptoms can sometimes resolve with cough. Has had cardiac monitor in the past, but did not have one of these episodes while wearing the monitor.     Notably, she has significant amount of caffeine during her day and admits to not drinking water frequently.   She denies any chest pain but admits to SOB with exertion. No orthopnea, no edema.   Past Medical History:  Diagnosis Date   Anxiety    Arthritis    oa   At moderate risk for fall 02/04/2021   Atypical lobular hyperplasia [facility]) of left breast 12/06/2019   Body mass index (BMI) 45.0-49.9, adult ([place]) 03/26/2021   Cardiac murmur 08/06/2020   Chronic cystitis    CKD (chronic kidney disease) stage 3, GFR 30-59 ml/min (HCC) 04/13/2017   Cystic thyroid nodule 08/09/2018   7x10 mm, right lobe   DDD (degenerative disc disease), cervical    Depression with anxiety 11/04/2007   Current med: xanax PRN, zoloft 100 mg qd.     Dysphagia 07/28/2018   Essential hypertension 11/04/2007   Current meds:  lisinopril/HCTZ 10/12.5 mg qd; tenormin 25 mg qd     Fibromyalgia    Gait instability 01/22/2019   GERD (gastroesophageal reflux disease)    Heart murmur    History of duodenal ulcer    2007   History of gastritis    2007   Hoarseness 08/22/2015   GI- did not feel gerd, ENT did not feel ent, neuro- felt possibly ent--> sent for 2nd opinion ent.    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 This patient is considered osteoporotic according to [facility]) criteria. L-1, L-4 were excluded due to degenerative changes. Per the official positions of the ISCD, it is not possible to quantitatively compare BMD or calculate an [facility] between exams done at different facilities.    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 MRI scheduled. 04/16/2016    Seasonal allergies 02/19/2016   Unspecified venous (peripheral) insufficiency    wears compression hose   Urine incontinence    Vitamin D deficiency 07/20/2008   Taking 1000 iu qd     Vitamin D deficiency, unspecified     Past Surgical History:  Procedure Laterality Date   ANTERIOR AND POSTERIOR VAGINAL REPAIR  04/29/2004   AND TRANSVAGINAL TAPE SLING   ANTERIOR CERVICAL  DECOMP/DISCECTOMY FUSION  1999   APPENDECTOMY  1984   BILATERAL SALPINGOOPHORECTOMY  1984   Bladder stimulator     BREAST BIOPSY Right 03/2021   BREAST LUMPECTOMY WITH RADIOACTIVE SEED LOCALIZATION Left 10/31/2019   Procedure: LEFT BREAST LUMPECTOMY WITH RADIOACTIVE SEED LOCALIZATION;  Surgeon: Erroll Luna, MD;  Location: [place];  Service: General;  Laterality: Left;   CARDIAC CATHETERIZATION  10-03-2002   DR Dannielle Burn   NORMAL CORONARIES ARTERIES/  EF 55%   CYSTOSCOPY N/A 08/27/2013   Procedure: CYSTOSCOPY;  Surgeon: Ailene Rud, MD;  Location[facility];  Service: Urology;  Laterality: N/A;   CYSTOSCOPY WITH INJECTION N/A 12/17/2013   Procedure: Ascencion Dike WITH INJECTION;  Surgeon: Ailene Rud, MD;  Location: [facility];  Service: Urology;  Laterality: N/A;   DILATION AND CURETTAGE OF UTERUS     EXCISION RIGHT NECK AND LEFT BREAST SEBACEOUS CYST  05/18/2002   HEMORRHOID BANDING  2014   PUBOVAGINAL SLING N/A 10/25/2016   Procedure: Gaynelle Arabian;  Surgeon: Carolan Clines, MD;  Location: [facility];  Service: Urology;  Laterality: N/A;   right litlle finger surgery  yrs ago   cyst removed x 4   TOTAL ABDOMINAL HYSTERECTOMY  1986   TUBAL LIGATION     VAGINAL PROLAPSE REPAIR N/A 08/27/2013   Procedure: ANTERIOR VAGINAL VAULT SUSPENSION, KELLY PLICATION WITH SACROSPINOUS LIGAMENT FIXATION AND XENFORM BOVINE DERMIS GRAFT AUGMENTATION, URETHRAL EXPLORATION, URETHROLYSIS, EXPLANTATION OF TVT TAPE, IMPLANTATION OF FLOSEAL, IMPLANTATION OF XENFORM BOVINE GRAFT IN PERIURETHRAL SPACE;  Surgeon: Ailene Rud, MD;  Location: [place];  Service: Urology;  Laterality: N/A   VAGINAL PROLAPSE REPAIR N/A 10/25/2016   Procedure: VAGINAL VAULT SUSPENSION with mesh and sacrospinous repair;  Surgeon: Carolan Clines, MD;  Location: [facility];  Service: Urology;  Laterality: N/A;    Current  Medications: Current Meds  Medication Sig   atenolol (TENORMIN) 25 MG tablet Take 0.5 tablets (12.5 mg total) by mouth daily.   calcium carbonate (OS-CAL) 600 MG TABS Take 600 mg by mouth daily.   CEQUA 0.09 % SOLN Apply 1 drop to eye 2 (two) times daily.   cetirizine (ZYRTEC) 10 MG tablet Take 10 mg by mouth daily as needed for allergies.   cholecalciferol (VITAMIN D3) 25 MCG (1000 UNIT) tablet Take 1,000 Units by mouth daily.   Cyanocobalamin (VITAMIN B-12) 1000 MCG SUBL Place 1 tablet under the tongue daily.    dicyclomine (BENTYL) 10 MG capsule Take 1 capsule (10 mg total) by mouth 4 (four) times daily -  before meals and at bedtime.   DULoxetine (CYMBALTA) 60 MG capsule Take 1 capsule (60 mg total) by mouth daily.   escitalopram (LEXAPRO) 20 MG tablet Take 1 tablet (20 mg total) by mouth daily.   fesoterodine (TOVIAZ) 8 MG TB24  tablet    fluticasone (FLONASE) 50 MCG/ACT nasal spray Place 1 spray into both nostrils as needed for allergies.   iron polysaccharides (NIFEREX) 150 MG capsule 1 tab p.o. Monday, Wednesday and Friday.   latanoprost (XALATAN) 0.005 % ophthalmic solution Place 1 drop into the left eye at bedtime.   LORazepam (ATIVAN) 0.5 MG tablet Take 1 tablet (0.5 mg total) by mouth every 12 (twelve) hours as needed for anxiety (for panic attack).   losartan-hydrochlorothiazide (HYZAAR) 50-12.5 MG tablet Take 0.5 tablets by mouth daily.   Multiple Vitamins-Minerals (WOMENS MULTIVITAMIN PLUS PO) Take 1 tablet by mouth daily.   Omega-3 Fatty Acids (FISH OIL PO) Take 1 capsule by mouth daily.    omeprazole (PRILOSEC) 40 MG capsule Take 1 capsule (40 mg total) by mouth 2 (two) times daily before a meal. 30 mins before breakfast and supper   pravastatin (PRAVACHOL) 40 MG tablet Take 1 tablet (40 mg total) by mouth daily.   tamoxifen (NOLVADEX) 20 MG tablet Take 20 mg by mouth daily.    traMADol (ULTRAM) 50 MG tablet Take 1 tablet (50 mg total) by mouth every 8 (eight) hours.    traZODone (DESYREL) 100 MG tablet Take 0.5-1 tablets (50-100 mg total) by mouth at bedtime.   Turmeric Curcumin 500 MG CAPS Take 1,000 mg by mouth daily.    Current Facility-Administered Medications for the 12/10/22 encounter (Office Visit) with Freada Bergeron, MD  Medication   cyanocobalamin (VITAMIN B12) injection 1,000 mcg     Allergies:   Meloxicam, Morphine, and Sulfa antibiotics   Social History   Socioeconomic History   Marital status: Divorced    Spouse name: Not on file   Number of children: 1   Years of education: Not on file   Highest education level: 12th grade  Occupational History   Occupation: Retired  Tobacco Use   Smoking status: Former    Packs/day: 1.00    Years: 15.00    Total pack years: 15.00    Types: Cigarettes    Start date: 12/06/1993    Quit date: 08/09/2009    Years since quitting: 13.3   Smokeless tobacco: Never   Tobacco comments:    quit smoking 5 years ago  Vaping Use   Vaping Use: Never used  Substance and Sexual Activity   Alcohol use: Not Currently    Comment: ocassional   Drug use: No   Sexual activity: Not Currently    Partners: Male  Other Topics Concern   Not on file  Social History Narrative   She is retired and divorced and has 1 child   Occasional alcohol, former smoker no drug use   Right handed    Caffeine 2-3 cups per day   Lives at alone    Social Determinants of Health   Financial Resource Strain: Reading  (10/13/2022)   Overall Financial Resource Strain (CARDIA)    Difficulty of Paying Living Expenses: Not very hard  Food Insecurity: No Food Insecurity (10/13/2022)   Hunger Vital Sign    Worried About Running Out of Food in the Last Year: Never true    Ran Out of Food in the Last Year: Never true  Transportation Needs: No Transportation Needs (10/13/2022)   PRAPARE - Hydrologist (Medical): No    Lack of Transportation (Non-Medical): No  Physical Activity: Inactive (10/13/2022)    Exercise Vital Sign    Days of Exercise per Week: 0 days    Minutes  of Exercise per Session: 0 min  Stress: No Stress Concern Present (10/13/2022)   Experiment    Feeling of Stress : Only a little  Social Connections: Moderately Isolated (10/13/2022)   Social Connection and Isolation Panel [NHANES]    Frequency of Communication with Friends and Family: More than three times a week    Frequency of Social Gatherings with Friends and Family: Once a week    Attends Religious Services: More than 4 times per year    Active Member of [employer] or Organizations: No    Attends Archivist Meetings: Never    Marital Status: Divorced     Family History: The patient's family history includes Anemia in her brother and sister; Arthritis in her mother; Cervical cancer in her mother; Colon polyps in an other family member; Coronary artery disease in her father; Dementia in her maternal aunt and maternal aunt; Depression in her sister; Fibromyalgia in her sister; Heart disease in her maternal uncle and mother; Hyperlipidemia in her brother; Hypertension in her brother, brother, mother, and sister; Kidney disease in her sister; Lung cancer in her father, maternal uncle, and paternal uncle; Myelodysplastic syndrome in her mother; Other in her paternal uncle; Pancreatic cancer in her cousin and sister. There is no history of Colon cancer.  ROS:   Please see the history of present illness.     All other systems reviewed and are negative.  EKGs/Labs/Other Studies Reviewed:    The following studies were reviewed today: TTE 2020/09/12: IMPRESSIONS     1. Left ventricular ejection fraction, by estimation, is 55 to 60%. The  left ventricle has normal function. The left ventricle has no regional  wall motion abnormalities. There is mild left ventricular hypertrophy.  Left ventricular diastolic parameters  are consistent with Grade I diastolic  dysfunction (impaired relaxation).   2. Right ventricular systolic function is normal. The right ventricular  size is normal. Tricuspid regurgitation signal is inadequate for assessing  PA pressure.   3. The mitral valve is normal in structure. No evidence of mitral valve  regurgitation. No evidence of mitral stenosis.   4. The aortic valve is tricuspid. Aortic valve regurgitation is not  visualized. No aortic stenosis is present.   5. The inferior vena cava is normal in size with greater than 50%  respiratory variability, suggesting right atrial pressure of 3 mmHg.   Myoview 09/12/20:  Nuclear stress EF: 64%. There was no ST segment deviation noted during stress. No T wave inversion was noted during stress. The study is normal. This is a low risk study. The left ventricular ejection fraction is normal (55-65%).   1. Mildly reduced apical counts on rest and stress with normal wall motion consistent with apical thinning artifact.  2. Normal study without ischemia or infarction. 3. Normal LVEF, 64%.  4. This is a low-risk study.   EKG:  EKG is  ordered today.  The ekg ordered today demonstrates NSR with HR 84  Recent Labs: 05/04/2022: Magnesium 1.9 10/19/2022: ALT 9; BUN 17; Creatinine, Ser 1.29; Potassium 5.0; Sodium 143; TSH 4.08 12/07/2022: Hemoglobin 11.6; Platelets 286.0  Recent Lipid Panel    Component Value Date/Time   CHOL 187 10/19/2022 0851   CHOL 165 03/30/2021 0957   TRIG 185.0 (H) 10/19/2022 0851   HDL 56.90 10/19/2022 0851   HDL 53 03/30/2021 0957   CHOLHDL 3 10/19/2022 0851   VLDL 37.0 10/19/2022 0851   LDLCALC 94 10/19/2022 0851  [place] 88 03/30/2021 0957     Risk Assessment/Calculations:         Physical Exam:    VS:  BP (!) 142/84   Pulse 84   Ht '5\' 5"'$  (1.651 m)   Wt 201 lb 12.8 oz (91.5 kg)   SpO2 95%   BMI 33.58 kg/m     Wt Readings from Last 3 Encounters:  12/10/22 201 lb 12.8 oz (91.5 kg)  12/07/22 202 lb 3.2 oz (91.7 kg)  10/19/22 205 lb  (93 kg)     GEN:  Well nourished, well developed in no acute distress HEENT: Normal NECK: No JVD CARDIAC: RRR, no murmurs, rubs, gallops RESPIRATORY:  Clear to auscultation without rales, wheezing or rhonchi  ABDOMEN: Soft, non-tender, non-distended MUSCULOSKELETAL:  No edema; No deformity  SKIN: Warm and dry NEUROLOGIC:  Alert and oriented x 3 PSYCHIATRIC:  Normal affect   ASSESSMENT:   Pre-syncope HTN HLD Obesity  PLAN:    In order of problems listed above:  #Presyncope: Patient having episodes of sudden onset lightheadedness with associated SOB that occur several times per week. Symptoms can happen at rest and with exertion and usually resolve within a couple of minutes. No syncope. Often these episodes correlate with periods of stress/anxiety. Has noticed that they can resolve with coughing. Overall, symptoms concerning for possible arrhythmia vs stress related. She is also having significant caffeine per day and we recommended cutting back. Will check zio monitor for further evaluation. -Check 14 day zio monitor -Cut back on caffeine -Increase hydration  #HTN: Elevated today but better controlled at home. Will monitor -Continue atenolol 12.'5mg'$  daily -Continue losartan-HCTZ 50-12.'5mg'$  daily  #HLD: #Aortic Atherosclerosis: -Continue pravastatin '40mg'$  daily -Declined Ca score today  #Obesity with BMI 33: -Discussed lifestyle modifications as below  Exercise recommendations: Goal of exercising for at least 30 minutes a day, at least 5 times per week.  Please exercise to a moderate exertion.  This means that while exercising it is difficult to speak in full sentences, however you are not so short of breath that you feel you must stop, and not so comfortable that you can carry on a full conversation.  Exertion level should be approximately a 5/10, if 10 is the most exertion you can perform.  Diet recommendations: Recommend a heart healthy diet such as the Mediterranean  diet.  This diet consists of plant based foods, healthy fats, lean meats, olive oil.  It suggests limiting the intake of simple carbohydrates such as white breads, pastries, and pastas.  It also limits the amount of red meat, wine, and dairy products such as cheese that one should consume on a daily basis.           Medication Adjustments/Labs and Tests Ordered: Current medicines are reviewed at length with the patient today.  Concerns regarding medicines are outlined above.  Orders Placed This Encounter  Procedures   LONG TERM MONITOR (3-14 DAYS)   EKG 12-Lead   No orders of the defined types were placed in this encounter.   Patient Instructions  Medication Instructions:   Your physician recommends that you continue on your current medications as directed. Please refer to the Current Medication list given to you today.  *If you need a refill on your cardiac medications before your next appointment, please call your pharmacy*   Testing/Procedures:  [place] Monitor Instructions  Your physician has requested you wear a ZIO patch monitor for 14 days.  This is a single patch monitor.  [employer] supplies one patch monitor per enrollment. Additional stickers are not available. Please do not apply patch if you will be having a Nuclear Stress Test,  Echocardiogram, Cardiac CT, MRI, or Chest Xray during the period you would be wearing the  monitor. The patch cannot be worn during these tests. You cannot remove and re-apply the  ZIO XT patch monitor.  Your ZIO patch monitor will be mailed 3 day USPS to your address on file. It may take 3-5 days  to receive your monitor after you have been enrolled.  Once you have received your monitor, please review the enclosed instructions. Your monitor  has already been registered assigning a specific monitor serial # to you.  Billing and Patient Assistance Program Information  We have supplied [employer] with any of your insurance information  on file for billing purposes. [employer] offers a sliding scale Patient Assistance Program for patients that do not have  insurance, or whose insurance does not completely cover the cost of the ZIO monitor.  You must apply for the Patient Assistance Program to qualify for this discounted rate.  To apply, please call [employer] at [phone], select option 4, select option 2, ask to apply for  Patient Assistance Program. Theodore Demark will ask your household income, and how many people  are in your household. They will quote your out-of-pocket cost based on that information.  [employer] will also be able to set up a 66-month interest-free payment plan if needed.  Applying the monitor   Shave hair from upper left chest.  Hold abrader disc by orange tab. Rub abrader in 40 strokes over the upper left chest as  indicated in your monitor instructions.  Clean area with 4 enclosed alcohol pads. Let dry.  Apply patch as indicated in monitor instructions. Patch will be placed under collarbone on left  side of chest with arrow pointing upward.  Rub patch adhesive wings for 2 minutes. Remove white label marked "1". Remove the white  label marked "2". Rub patch adhesive wings for 2 additional minutes.  While looking in a mirror, press and release button in center of patch. A small green light will  flash 3-4 times. This will be your only indicator that the monitor has been turned on.  Do not shower for the first 24 hours. You may shower after the first 24 hours.  Press the button if you feel a symptom. You will hear a small click. Record Date, Time and  Symptom in the Patient Logbook.  When you are ready to remove the patch, follow instructions on the last 2 pages of Patient  Logbook. Stick patch monitor onto the last page of Patient Logbook.  Place Patient Logbook in the blue and white box. Use locking tab on box and tape box closed  securely. The blue and white box has prepaid postage on it. Please place it in  the mailbox as  soon as possible. Your physician should have your test results approximately 7 days after the  monitor [facility]  Call [place]at [phone] you have questions regarding  your ZIO XT patch monitor. Call them immediately if you see an orange light blinking on your  monitor.  If your monitor falls off in less than 4 days, contact our Monitor department at [phone]  If your monitor becomes loose or falls off after 4 days call [employer] at [phone]  suggestions on securing your monitor    Follow-Up: [facility], you and your health needs are our priority.  As part of our continuing mission to provide you with exceptional heart care, we have created designated Provider Care Teams.  These Care Teams include your primary Cardiologist (physician) and Advanced Practice Providers (APPs -  Physician Assistants and Nurse Practitioners) who all work together to provide you with the care you need, when you need it.  We recommend signing up for the patient portal called "MyChart".  Sign up information is provided on this After Visit Summary.  MyChart is used to connect with patients for Virtual Visits (Telemedicine).  Patients are able to view lab/test results, encounter notes, upcoming appointments, etc.  Non-urgent messages can be sent to your provider as well.   To learn more about what you can do with MyChart, go to [email or web address].    Your next appointment:   6 month(s)  Provider:   Dr. Johney Frame     Signed, Freada Bergeron, MD  12/10/2022 10:56 AM    [place]

## 2022-12-10 NOTE — Progress Notes (Unsigned)
Enrolled patient for a 14 day Zio XT  monitor to be mailed to patients home  °

## 2022-12-10 NOTE — Patient Instructions (Signed)
Medication Instructions:   Your physician recommends that you continue on your current medications as directed. Please refer to the Current Medication list given to you today.  *If you need a refill on your cardiac medications before your next appointment, please call your pharmacy*   Testing/Procedures:  [place] Monitor Instructions  Your physician has requested you wear a ZIO patch monitor for 14 days.  This is a single patch monitor. [employer] supplies one patch monitor per enrollment. Additional stickers are not available. Please do not apply patch if you will be having a Nuclear Stress Test,  Echocardiogram, Cardiac CT, MRI, or Chest Xray during the period you would be wearing the  monitor. The patch cannot be worn during these tests. You cannot remove and re-apply the  ZIO XT patch monitor.  Your ZIO patch monitor will be mailed 3 day USPS to your address on file. It may take 3-5 days  to receive your monitor after you have been enrolled.  Once you have received your monitor, please review the enclosed instructions. Your monitor  has already been registered assigning a specific monitor serial # to you.  Billing and Patient Assistance Program Information  We have supplied [employer] with any of your insurance information on file for billing purposes. [employer] offers a sliding scale Patient Assistance Program for patients that do not have  insurance, or whose insurance does not completely cover the cost of the ZIO monitor.  You must apply for the Patient Assistance Program to qualify for this discounted rate.  To apply, please call [employer] at [phone], select option 4, select option 2, ask to apply for  Patient Assistance Program. Theodore Demark will ask your household income, and how many people  are in your household. They will quote your out-of-pocket cost based on that information.  [employer] will also be able to set up a 52-month interest-free payment plan if  needed.  Applying the monitor   Shave hair from upper left chest.  Hold abrader disc by orange tab. Rub abrader in 40 strokes over the upper left chest as  indicated in your monitor instructions.  Clean area with 4 enclosed alcohol pads. Let dry.  Apply patch as indicated in monitor instructions. Patch will be placed under collarbone on left  side of chest with arrow pointing upward.  Rub patch adhesive wings for 2 minutes. Remove white label marked "1". Remove the white  label marked "2". Rub patch adhesive wings for 2 additional minutes.  While looking in a mirror, press and release button in center of patch. A small green light will  flash 3-4 times. This will be your only indicator that the monitor has been turned on.  Do not shower for the first 24 hours. You may shower after the first 24 hours.  Press the button if you feel a symptom. You will hear a small click. Record Date, Time and  Symptom in the Patient Logbook.  When you are ready to remove the patch, follow instructions on the last 2 pages of Patient  Logbook. Stick patch monitor onto the last page of Patient Logbook.  Place Patient Logbook in the blue and white box. Use locking tab on box and tape box closed  securely. The blue and white box has prepaid postage on it. Please place it in the mailbox as  soon as possible. Your physician should have your test results approximately 7 days after the  monitor has [facility]  Call [employer]  Customer Care at [phone] if you have questions regarding  your ZIO XT patch monitor. Call them immediately if you see an orange light blinking on your  monitor.  If your monitor falls off in less than 4 days, contact our Monitor department at [phone].  If your monitor becomes loose or falls off after 4 days call [employer] at [phone] for  suggestions on securing your monitor    Follow-Up: At [facility], you and your health needs are  our priority.  As part of our continuing mission to provide you with exceptional heart care, we have created designated Provider Care Teams.  These Care Teams include your primary Cardiologist (physician) and Advanced Practice Providers (APPs -  Physician Assistants and Nurse Practitioners) who all work together to provide you with the care you need, when you need it.  We recommend signing up for the patient portal called "MyChart".  Sign up information is provided on this After Visit Summary.  MyChart is used to connect with patients for Virtual Visits (Telemedicine).  Patients are able to view lab/test results, encounter notes, upcoming appointments, etc.  Non-urgent messages can be sent to your provider as well.   To learn more about what you can do with MyChart, go to [email or web address].    Your next appointment:   6 month(s)  Provider:   Dr. Johney Frame

## 2022-12-10 NOTE — Telephone Encounter (Signed)
-----   Message from Gerarda Gunther sent at 12/10/2022 10:57 AM EST ----- Regarding: RE: 14 DAY ZIO FOR PALPITATIONS done ----- Message ----- From: Nuala Alpha, LPN Sent: 12/10/2503  39:76 AM EST To: Jennefer Bravo; Katrina Cloyd Stagers Subject: 14 DAY ZIO FOR PALPITATIONS                    14 Day zio for palpitations ordered  Please enroll and let me know  Thanks [employer]

## 2022-12-11 DIAGNOSIS — H524 Presbyopia: Secondary | ICD-10-CM | POA: Diagnosis not present

## 2023-01-04 ENCOUNTER — Other Ambulatory Visit: Payer: Self-pay | Admitting: Family Medicine

## 2023-01-21 ENCOUNTER — Ambulatory Visit: Payer: Medicare HMO | Admitting: Cardiology

## 2023-03-14 ENCOUNTER — Other Ambulatory Visit: Payer: Self-pay | Admitting: Family Medicine

## 2023-04-10 ENCOUNTER — Other Ambulatory Visit: Payer: Self-pay | Admitting: Family Medicine

## 2023-05-04 ENCOUNTER — Encounter (INDEPENDENT_AMBULATORY_CARE_PROVIDER_SITE_OTHER): Payer: Self-pay

## 2023-05-10 DIAGNOSIS — Z0100 Encounter for examination of eyes and vision without abnormal findings: Secondary | ICD-10-CM | POA: Diagnosis not present

## 2023-05-16 DIAGNOSIS — H16223 Keratoconjunctivitis sicca, not specified as Sjogren's, bilateral: Secondary | ICD-10-CM | POA: Diagnosis not present

## 2023-05-16 DIAGNOSIS — H35342 Macular cyst, hole, or pseudohole, left eye: Secondary | ICD-10-CM | POA: Diagnosis not present

## 2023-05-16 DIAGNOSIS — H40022 Open angle with borderline findings, high risk, left eye: Secondary | ICD-10-CM | POA: Diagnosis not present

## 2023-05-16 DIAGNOSIS — H17823 Peripheral opacity of cornea, bilateral: Secondary | ICD-10-CM | POA: Diagnosis not present

## 2023-05-17 ENCOUNTER — Ambulatory Visit: Payer: Medicare HMO | Admitting: Family Medicine

## 2023-05-18 ENCOUNTER — Encounter: Payer: Medicare HMO | Admitting: Family Medicine

## 2023-05-18 NOTE — Progress Notes (Signed)
No show- no charge

## 2023-05-23 ENCOUNTER — Ambulatory Visit (INDEPENDENT_AMBULATORY_CARE_PROVIDER_SITE_OTHER): Payer: Self-pay | Admitting: Family Medicine

## 2023-05-23 DIAGNOSIS — E781 Pure hyperglyceridemia: Secondary | ICD-10-CM

## 2023-05-23 DIAGNOSIS — F410 Panic disorder [episodic paroxysmal anxiety] without agoraphobia: Secondary | ICD-10-CM

## 2023-05-23 DIAGNOSIS — K580 Irritable bowel syndrome with diarrhea: Secondary | ICD-10-CM

## 2023-05-23 DIAGNOSIS — K219 Gastro-esophageal reflux disease without esophagitis: Secondary | ICD-10-CM

## 2023-05-23 DIAGNOSIS — M1712 Unilateral primary osteoarthritis, left knee: Secondary | ICD-10-CM

## 2023-05-23 DIAGNOSIS — F330 Major depressive disorder, recurrent, mild: Secondary | ICD-10-CM

## 2023-05-23 DIAGNOSIS — N1832 Chronic kidney disease, stage 3b: Secondary | ICD-10-CM

## 2023-05-23 DIAGNOSIS — M810 Age-related osteoporosis without current pathological fracture: Secondary | ICD-10-CM

## 2023-05-23 DIAGNOSIS — Z91199 Patient's noncompliance with other medical treatment and regimen due to unspecified reason: Secondary | ICD-10-CM

## 2023-05-23 DIAGNOSIS — I10 Essential (primary) hypertension: Secondary | ICD-10-CM

## 2023-05-23 DIAGNOSIS — E559 Vitamin D deficiency, unspecified: Secondary | ICD-10-CM

## 2023-05-23 NOTE — Patient Instructions (Signed)

## 2023-05-23 NOTE — Progress Notes (Signed)
    No show- cancel 20 minutes prior to appt. 2nd in  1 week

## 2023-05-25 ENCOUNTER — Other Ambulatory Visit: Payer: Self-pay | Admitting: Family Medicine

## 2023-05-25 NOTE — Telephone Encounter (Signed)
Called and spoke with pt; pt has enough to last until app on 8/27. All meds pending.

## 2023-05-31 ENCOUNTER — Ambulatory Visit (INDEPENDENT_AMBULATORY_CARE_PROVIDER_SITE_OTHER): Payer: Medicare HMO | Admitting: Family Medicine

## 2023-05-31 ENCOUNTER — Other Ambulatory Visit: Payer: Self-pay

## 2023-05-31 ENCOUNTER — Encounter: Payer: Self-pay | Admitting: Family Medicine

## 2023-05-31 VITALS — BP 140/78 | HR 88 | Temp 98.2°F | Wt 198.0 lb

## 2023-05-31 DIAGNOSIS — E559 Vitamin D deficiency, unspecified: Secondary | ICD-10-CM | POA: Diagnosis not present

## 2023-05-31 DIAGNOSIS — M810 Age-related osteoporosis without current pathological fracture: Secondary | ICD-10-CM

## 2023-05-31 DIAGNOSIS — F330 Major depressive disorder, recurrent, mild: Secondary | ICD-10-CM

## 2023-05-31 DIAGNOSIS — I700 Atherosclerosis of aorta: Secondary | ICD-10-CM

## 2023-05-31 DIAGNOSIS — Z23 Encounter for immunization: Secondary | ICD-10-CM | POA: Diagnosis not present

## 2023-05-31 DIAGNOSIS — M5030 Other cervical disc degeneration, unspecified cervical region: Secondary | ICD-10-CM

## 2023-05-31 DIAGNOSIS — M1712 Unilateral primary osteoarthritis, left knee: Secondary | ICD-10-CM | POA: Diagnosis not present

## 2023-05-31 DIAGNOSIS — I10 Essential (primary) hypertension: Secondary | ICD-10-CM | POA: Diagnosis not present

## 2023-05-31 DIAGNOSIS — G4700 Insomnia, unspecified: Secondary | ICD-10-CM

## 2023-05-31 DIAGNOSIS — M79604 Pain in right leg: Secondary | ICD-10-CM

## 2023-05-31 DIAGNOSIS — K219 Gastro-esophageal reflux disease without esophagitis: Secondary | ICD-10-CM

## 2023-05-31 DIAGNOSIS — Z0289 Encounter for other administrative examinations: Secondary | ICD-10-CM

## 2023-05-31 DIAGNOSIS — F410 Panic disorder [episodic paroxysmal anxiety] without agoraphobia: Secondary | ICD-10-CM | POA: Diagnosis not present

## 2023-05-31 DIAGNOSIS — N1832 Chronic kidney disease, stage 3b: Secondary | ICD-10-CM | POA: Diagnosis not present

## 2023-05-31 DIAGNOSIS — M545 Low back pain, unspecified: Secondary | ICD-10-CM

## 2023-05-31 DIAGNOSIS — F419 Anxiety disorder, unspecified: Secondary | ICD-10-CM

## 2023-05-31 DIAGNOSIS — Z79899 Other long term (current) drug therapy: Secondary | ICD-10-CM

## 2023-05-31 MED ORDER — DULOXETINE HCL 60 MG PO CPEP: 60.0000 mg | ORAL_CAPSULE | Freq: Every day | ORAL | 1 refills | Status: DC

## 2023-05-31 MED ORDER — ATENOLOL 25 MG PO TABS: 12.5000 mg | ORAL_TABLET | Freq: Every day | ORAL | 3 refills | Status: DC

## 2023-05-31 MED ORDER — LOSARTAN POTASSIUM-HCTZ 50-12.5 MG PO TABS: 0.5000 | ORAL_TABLET | Freq: Every day | ORAL | 1 refills | Status: DC

## 2023-05-31 MED ORDER — PRAVASTATIN SODIUM 40 MG PO TABS: 40.0000 mg | ORAL_TABLET | Freq: Every day | ORAL | 3 refills | Status: DC

## 2023-05-31 MED ORDER — DICYCLOMINE HCL 10 MG PO CAPS: 10.0000 mg | ORAL_CAPSULE | Freq: Three times a day (TID) | ORAL | 1 refills | Status: DC

## 2023-05-31 MED ORDER — ESCITALOPRAM OXALATE 20 MG PO TABS: 20.0000 mg | ORAL_TABLET | Freq: Every day | ORAL | 1 refills | Status: DC

## 2023-05-31 MED ORDER — LORAZEPAM 0.5 MG PO TABS
0.5000 mg | ORAL_TABLET | Freq: Two times a day (BID) | ORAL | 1 refills | Status: DC | PRN
Start: 2023-05-31 — End: 2023-11-25

## 2023-05-31 MED ORDER — TRAMADOL HCL 50 MG PO TABS: 50.0000 mg | ORAL_TABLET | Freq: Three times a day (TID) | ORAL | 1 refills | Status: DC

## 2023-05-31 MED ORDER — MIRTAZAPINE 15 MG PO TABS: 15.0000 mg | ORAL_TABLET | Freq: Every day | ORAL | 1 refills | Status: DC

## 2023-05-31 MED ORDER — OMEPRAZOLE 40 MG PO CPDR: 40.0000 mg | DELAYED_RELEASE_CAPSULE | Freq: Two times a day (BID) | ORAL | 3 refills | Status: DC

## 2023-05-31 MED ORDER — TRAZODONE HCL 100 MG PO TABS: 100.0000 mg | ORAL_TABLET | Freq: Every day | ORAL | 1 refills | Status: DC

## 2023-05-31 NOTE — Progress Notes (Signed)
Patient ID: [name] [name], female  DOB: 04-29-1946, 77 y.o.   MRN: [id number] Patient Care Team    Relationship Specialty Notifications Start End  Natalia Leatherwood, DO PCP - General Family Medicine  06/02/15   Iva Boop, MD Consulting Physician Gastroenterology  11/24/15   Nelson Chimes, MD Consulting Physician Ophthalmology  11/24/15   Specialists, [facility]  Orthopedic Surgery  04/12/16   Pa, [facility]    01/16/18   Rheumatology, [facility]    01/16/18   York Spaniel, MD (Inactive) Consulting Physician Neurology  10/24/18     Chief Complaint  Patient presents with   Hypertension    Follow up on BP. She does want a flu shot.    Subjective: [name] [name] is a 77 y.o.  female present for [facility] All past medical history, surgical history, allergies, family history, immunizations, medications and social history were updated in the electronic medical record today. All recent labs, ED visits and hospitalizations within the last year were reviewed.  Hypertension/hyperlipidemia/CKD3:  Pt reports compliance with Pravastatin 40 mg, losartan-HCTZ 50-12.5 mg daily (1/2 tab) mg and atenolol 12.5 mg. However, she reports she did not take medication before appt today bc she is fasting.  Patient denies chest pain, shortness of breath, dizziness or lower extremity edema.    Pt does not take daily baby ASA. Pt is  prescribed statin. Labs completed today Diet: Low-sodium Exercise: Attempts RF: Hypertension, hyperlipidemia. Family history of heart disease, former smoker, obesity   Anxiety/panic attack/insomnia: Patient reports compliance with lexapro 20 mg daily, Cymbalta day, trazodone 100 mg nightly.  She states when she ran out of Ativan she noticed she was not sleeping at all without it.  She still complains of increased fatigue She had been on zoloft and tapered off when lexapro started. Cymbalta has been at maximum dose and likely chosen secondary to her  lumbar/arthritis issues.   She does take  Ativan twice daily.She feels her mental health is doing well on current regimen.     Arthritis/pain/lumbar pain with radiation down right leg: Patient reports compliance with tramadol 50 mg twice a day when necessary for arthritic pain.She suffers from low back pain .  She has h/o anterior cervical decompression/discectomy in the past. She reports her arthritis pain has been very bad the last month.  Indication for chronic opioid: Moderate to severe arthritis and osteoporosis discomfort Medication and dose: Tramadol 50 mg TID  # pills per: #60 Last UDS date: UTD Pain contract signed (Y/N): Yes Date narcotic database last reviewed (include red flags): 05/31/23 She has been exercising, performing PT twice a week. She feels her balance is better.       05/31/2023   10:24 AM 12/07/2022    9:37 AM 10/19/2022    8:29 AM 10/13/2022    9:04 AM 05/04/2022   10:36 AM  Depression screen PHQ 2/9  Decreased Interest 0 2 2 3 3   Down, Depressed, Hopeless 0 2 2 2 2   PHQ - 2 Score 0 4 4 5 5   Altered sleeping 0 3 1 1 1   Tired, decreased energy 3 3 3  3   Change in appetite 0 0 2  0  Feeling bad or failure about yourself  0 0 1  0  Trouble concentrating 0 0 0  1  Moving slowly or fidgety/restless 0 0 0  0  Suicidal thoughts 0 0 0  0  PHQ-9 Score 3 10 11  6  10  Difficult doing work/chores Somewhat difficult Somewhat difficult         05/31/2023   10:25 AM 12/07/2022    9:37 AM 10/19/2022    8:29 AM 05/04/2022   10:36 AM  GAD 7 : Generalized Anxiety Score  Nervous, Anxious, on Edge 0 0 3 1  Control/stop worrying 0 3 3 1   Worry too much - different things 0 3 2 2   Trouble relaxing 1 1 1 2   Restless 0 2 2 0  Easily annoyed or irritable 0 0 0 0  Afraid - awful might happen 1 0 0 1  Total GAD 7 Score 2 9 11 7   Anxiety Difficulty Very difficult             05/31/2023   10:24 AM 12/07/2022    9:36 AM 10/13/2022    9:07 AM 10/26/2021    3:02 PM 09/16/2021   10:24  AM  Fall Risk   Falls in the past year? 1 0 1 1 1   Number falls in past yr: 1 0 0 0 1  Injury with Fall? 0 0 0 0 0  Risk for fall due to : History of fall(s) History of fall(s)   Impaired vision;Impaired balance/gait;Impaired mobility  Follow up Falls evaluation completed Falls evaluation completed Falls evaluation completed  Falls prevention discussed    Immunization History  Administered Date(s) Administered   Fluad Quad(high Dose 65+) 07/04/2022, 05/31/2023   Influenza Split 07/02/2011   Influenza Whole 07/16/2008, 07/04/2009, 07/14/2010   Influenza, High Dose Seasonal PF 07/04/2018, 06/20/2019   Influenza,inj,Quad PF,6+ Mos 06/29/2013, 07/08/2014, 06/23/2017   Influenza-Unspecified 07/02/2015, 06/22/2019, 04/30/2021   Moderna Sars-Covid-2 Vaccination 12/20/2019, 01/17/2020, 05/23/2020, 04/30/2021   Pneumococcal Conjugate-13 05/15/2014   Pneumococcal Polysaccharide-23 10/04/2006, 09/11/2015   Td 03/19/2010   Tdap 03/10/2020   Unspecified SARS-COV-2 Vaccination 07/06/2022   Zoster Recombinant(Shingrix) 07/07/2019, 09/05/2019   Zoster, Live 07/25/2015    No results found.  Past Medical History:  Diagnosis Date   Anxiety    Arthritis    oa   At moderate risk for fall 02/04/2021   Atypical lobular hyperplasia [facility]) of left breast 12/06/2019   Body mass index (BMI) 45.0-49.9, adult (HCC) 03/26/2021   Cardiac murmur 08/06/2020   Chronic cystitis    CKD (chronic kidney disease) stage 3, GFR 30-59 ml/min (HCC) 04/13/2017   Cystic thyroid nodule 08/09/2018   7x10 mm, right lobe   DDD (degenerative disc disease), cervical    Depression with anxiety 11/04/2007   Current med: xanax PRN, zoloft 100 mg qd.     Dysphagia 07/28/2018   Essential hypertension 11/04/2007   Current meds: lisinopril/HCTZ 10/12.5 mg qd; tenormin 25 mg qd     Fibromyalgia    Gait instability 01/22/2019   GERD (gastroesophageal reflux disease)    Heart murmur    History of duodenal ulcer    2007    History of gastritis    2007   Hoarseness 08/22/2015   GI- did not feel gerd, ENT did not feel ent, neuro- felt possibly ent--> sent for 2nd opinion ent.    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 This patient is considered osteoporotic according to World Health Organization [facility]) criteria. L-1, L-4 were excluded due to degenerative changes. Per the official positions of the ISCD, it is not possible to quantitatively compare BMD or calculate an [facility] between exams done at different facilities.    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 MRI scheduled. 04/16/2016    Seasonal allergies 02/19/2016   Unspecified venous (peripheral) insufficiency    wears compression hose   Urine incontinence    Vitamin D deficiency 07/20/2008   Taking 1000 iu qd     Vitamin D deficiency, unspecified    Allergies  Allergen Reactions   Meloxicam Swelling    Caused edema and Cr increase   Morphine Nausea And Vomiting    SEVERE   Sulfa Antibiotics Swelling   Past Surgical History:  Procedure Laterality Date   ANTERIOR AND POSTERIOR VAGINAL REPAIR  04/29/2004   AND TRANSVAGINAL TAPE SLING   ANTERIOR CERVICAL DECOMP/DISCECTOMY FUSION  1999   APPENDECTOMY  1984    BILATERAL SALPINGOOPHORECTOMY  1984   Bladder stimulator     BREAST BIOPSY Right 03/2021   BREAST LUMPECTOMY WITH RADIOACTIVE SEED LOCALIZATION Left 10/31/2019   Procedure: LEFT BREAST LUMPECTOMY WITH RADIOACTIVE SEED LOCALIZATION;  Surgeon: Harriette Bouillon, MD;  Location: [facility];  Service: General;  Laterality: Left;   CARDIAC CATHETERIZATION  10-03-2002   DR Andee Lineman   NORMAL CORONARIES ARTERIES/  EF 55%   CYSTOSCOPY N/A 08/27/2013   Procedure: CYSTOSCOPY;  Surgeon: Kathi Ludwig, MD;  Location: [facility];  Service: Urology;  Laterality: N/A;   CYSTOSCOPY WITH INJECTION N/A 12/17/2013   Procedure: Eustace Pen WITH INJECTION;  Surgeon: Kathi Ludwig, MD;  Location: [facility];  Service: Urology;  Laterality: N/A;   DILATION AND CURETTAGE OF UTERUS     EXCISION RIGHT NECK AND LEFT BREAST SEBACEOUS CYST  05/18/2002   HEMORRHOID BANDING  2014   PUBOVAGINAL SLING N/A 10/25/2016   Procedure: Leonides Grills;  Surgeon: [name], MD;  Location: [facility];  Service: Urology;  Laterality: N/A;   right litlle finger surgery  yrs ago   cyst removed x 4   TOTAL ABDOMINAL HYSTERECTOMY  1986   TUBAL LIGATION     VAGINAL PROLAPSE REPAIR N/A 08/27/2013   Procedure: ANTERIOR VAGINAL VAULT SUSPENSION, KELLY PLICATION WITH SACROSPINOUS LIGAMENT FIXATION AND XENFORM BOVINE DERMIS GRAFT AUGMENTATION, URETHRAL EXPLORATION, URETHROLYSIS, EXPLANTATION OF TVT TAPE, IMPLANTATION OF FLOSEAL, IMPLANTATION OF XENFORM BOVINE GRAFT IN PERIURETHRAL SPACE;  Surgeon: Kathi Ludwig, MD;  Location: [facility];  Service: Urology;  Laterality: N/A   VAGINAL PROLAPSE REPAIR N/A 10/25/2016   Procedure: VAGINAL VAULT SUSPENSION with mesh and sacrospinous repair;  Surgeon: [name], MD;  Location: [facility];  Service: Urology;  Laterality: N/A;   Family History  Problem Relation Age of Onset   Hypertension Mother     Heart disease Mother    Myelodysplastic syndrome Mother    Arthritis Mother    Cervical cancer Mother    Lung cancer Father    Coronary artery disease Father  Fibromyalgia Sister    Hypertension Sister    Anemia Sister    Depression Sister    Pancreatic cancer Sister        died January 17, 20233 mos after dx   Kidney disease Sister        kidney stones, removed kidney   Hypertension Brother    Anemia Brother    Hypertension Brother    Hyperlipidemia Brother    Dementia Maternal Aunt    Dementia Maternal Aunt    Heart disease Maternal Uncle    Lung cancer Maternal Uncle    Lung cancer Paternal Uncle    Other Paternal Uncle        brain Tumor   Pancreatic cancer Cousin        maternal   Colon polyps Other        aunt   Colon cancer Neg Hx    Social History   Social History Narrative   She is retired and divorced and has 1 child   Occasional alcohol, former smoker no drug use   Right handed    Caffeine 2-3 cups per day   Lives at alone     Allergies as of 05/31/2023       Reactions   Meloxicam Swelling   Caused edema and Cr increase   Morphine Nausea And Vomiting   SEVERE   Sulfa Antibiotics Swelling        Medication List        Accurate as of May 31, 2023  1:40 PM. If you have any questions, ask your nurse or doctor.          STOP taking these medications    traZODone 100 MG tablet Commonly known as: DESYREL Stopped by: Felix Pacini       TAKE these medications    atenolol 25 MG tablet Commonly known as: TENORMIN Take 0.5 tablets (12.5 mg total) by mouth daily.   calcium carbonate 600 MG Tabs tablet Commonly known as: OS-CAL Take 600 mg by mouth daily.   Cequa 0.09 % Soln Generic drug: cycloSPORINE (PF) Apply 1 drop to eye 2 (two) times daily.   cetirizine 10 MG tablet Commonly known as: ZYRTEC Take 10 mg by mouth daily as needed for allergies.   cholecalciferol 25 MCG (1000 UNIT) tablet Commonly known as: VITAMIN D3 Take 1,000  Units by mouth daily.   dicyclomine 10 MG capsule Commonly known as: BENTYL Take 1 capsule (10 mg total) by mouth 4 (four) times daily -  before meals and at bedtime.   DULoxetine 60 MG capsule Commonly known as: CYMBALTA Take 1 capsule (60 mg total) by mouth daily.   escitalopram 20 MG tablet Commonly known as: LEXAPRO Take 1 tablet (20 mg total) by mouth daily.   fesoterodine 8 MG Tb24 tablet Commonly known as: TOVIAZ   FISH OIL PO Take 1 capsule by mouth daily.   fluticasone 50 MCG/ACT nasal spray Commonly known as: FLONASE Place 1 spray into both nostrils as needed for allergies.   iron polysaccharides 150 MG capsule Commonly known as: NIFEREX TAKE 1 CAPSULE ON MONDAY, WEDNESDAY AND FRIDAY.   latanoprost 0.005 % ophthalmic solution Commonly known as: XALATAN Place 1 drop into the left eye at bedtime.   LORazepam 0.5 MG tablet Commonly known as: ATIVAN Take 1 tablet (0.5 mg total) by mouth every 12 (twelve) hours as needed for anxiety (for panic attack).   losartan-hydrochlorothiazide 50-12.5 MG tablet Commonly known as: HYZAAR Take 0.5 tablets by  mouth daily.   mirtazapine 15 MG tablet Commonly known as: REMERON Take 1 tablet (15 mg total) by mouth at bedtime. Started by: Felix Pacini   omeprazole 40 MG capsule Commonly known as: PRILOSEC Take 1 capsule (40 mg total) by mouth 2 (two) times daily before a meal. 30 mins before breakfast and supper   pravastatin 40 MG tablet Commonly known as: PRAVACHOL Take 1 tablet (40 mg total) by mouth daily.   tamoxifen 20 MG tablet Commonly known as: NOLVADEX Take 20 mg by mouth daily.   traMADol 50 MG tablet Commonly known as: ULTRAM Take 1 tablet (50 mg total) by mouth every 8 (eight) hours.   Turmeric Curcumin 500 MG Caps Take 1,000 mg by mouth daily.   Vitamin B-12 1000 MCG Subl Place 1 tablet under the tongue daily.   WOMENS MULTIVITAMIN PLUS PO Take 1 tablet by mouth daily.        All past medical  history, surgical history, allergies, family history, immunizations andmedications were updated in the EMR today and reviewed under the history and medication portions of their EMR.      ROS: 14 pt review of systems performed and negative (unless mentioned in an HPI)  Objective: BP (!) 140/78   Pulse 88   Temp 98.2 F (36.8 C) (Oral)   Wt 198 lb (89.8 kg)   SpO2 98%   BMI 32.95 kg/m  Physical Exam Vitals and nursing note reviewed.  Constitutional:      General: She is not in acute distress.    Appearance: Normal appearance. She is not ill-appearing, toxic-appearing or diaphoretic.  HENT:     Head: Normocephalic and atraumatic.  Eyes:     General: No scleral icterus.       Right eye: No discharge.        Left eye: No discharge.     Extraocular Movements: Extraocular movements intact.     Conjunctiva/sclera: Conjunctivae normal.     Pupils: Pupils are equal, round, and reactive to light.  Cardiovascular:     Rate and Rhythm: Normal rate and regular rhythm.  Pulmonary:     Effort: Pulmonary effort is normal. No respiratory distress.     Breath sounds: Normal breath sounds. No wheezing, rhonchi or rales.  Musculoskeletal:     Right lower leg: No edema.     Left lower leg: No edema.  Skin:    General: Skin is warm.     Findings: No rash.  Neurological:     Mental Status: She is alert and oriented to person, place, and time. Mental status is at baseline.     Motor: No weakness.     Gait: Gait normal.  Psychiatric:        Mood and Affect: Mood normal.        Behavior: Behavior normal.        Thought Content: Thought content normal.        Judgment: Judgment normal.    Assessment/plan: MAIRI KWIAT is a 77 y.o. female present for [facility] Depression with anxiety/insomnia/panic d/o/benzo use Stable Continue  Lexapro 20 mg daily Continue Cymbalta 60 mg qd Discontinue trazodone 100 mg daily at bedtime.>  Start Remeron 15 mg nightly in its place. Continue Ativan provided for  6 months. NCCS database reviewed and appropriate 05/31/23 - UDS - UTD - Controlled substance contract signed. - pt declined psychology referral again today.    Hypertension/morbid obesity/hyperlipidemia//palpitations: Stable Continue losartan-HCTZ (1/2 tab) Continue atenolol to 12.5 mg daily.  If palpitations worsen, can return to atenolol 25 mg daily.   Continue statin - diet and exercise modifications recommended.  Patient instructed to always take her blood pressure medications daily whether fasting or not.  CKD3: Renal dose medications.  Avoid nsaids  hydrate-patient needs to hydrate more, she does not drink much fluid daily.  Vitamin D deficiency/osteoporosis:  Continue vit d supplement reclast - last tx d/t to 5 yr >01/2021  Arthritis/pain/lumbar pain: Stable Continue tramadol to TID, for worsening chronic arthritic pain.  - UDS UTD - contract up-to-date. - NCCS database reviewed 05/31/23 and appropriate.  - Follow-up in 5.5 months if needing prescription refilled.   Atypical lobular hyperplasia (ALH) of left breast Following with surgical team  Pernicious anemia Continue B12- sublingual for best absorption.   Other irritable bowel syndrome Stable Continue Bentyl as needed  Influenza vaccine administered today.  Return in about 24 weeks (around 11/15/2023).  No orders of the defined types were placed in this encounter.   Meds ordered this encounter  Medications   dicyclomine (BENTYL) 10 MG capsule    Sig: Take 1 capsule (10 mg total) by mouth 4 (four) times daily -  before meals and at bedtime.    Dispense:  120 capsule    Refill:  1   DULoxetine (CYMBALTA) 60 MG capsule    Sig: Take 1 capsule (60 mg total) by mouth daily.    Dispense:  90 capsule    Refill:  1   escitalopram (LEXAPRO) 20 MG tablet    Sig: Take 1 tablet (20 mg total) by mouth daily.    Dispense:  90 tablet    Refill:  1   LORazepam (ATIVAN) 0.5 MG tablet    Sig: Take 1 tablet (0.5 mg  total) by mouth every 12 (twelve) hours as needed for anxiety (for panic attack).    Dispense:  180 tablet    Refill:  1   losartan-hydrochlorothiazide (HYZAAR) 50-12.5 MG tablet    Sig: Take 0.5 tablets by mouth daily.    Dispense:  45 tablet    Refill:  1   omeprazole (PRILOSEC) 40 MG capsule    Sig: Take 1 capsule (40 mg total) by mouth 2 (two) times daily before a meal. 30 mins before breakfast and supper    Dispense:  180 capsule    Refill:  3   pravastatin (PRAVACHOL) 40 MG tablet    Sig: Take 1 tablet (40 mg total) by mouth daily.    Dispense:  90 tablet    Refill:  3   traMADol (ULTRAM) 50 MG tablet    Sig: Take 1 tablet (50 mg total) by mouth every 8 (eight) hours.    Dispense:  270 tablet    Refill:  1   DISCONTD: traZODone (DESYREL) 100 MG tablet    Sig: Take 1 tablet (100 mg total) by mouth at bedtime.    Dispense:  90 tablet    Refill:  1   atenolol (TENORMIN) 25 MG tablet    Sig: Take 0.5 tablets (12.5 mg total) by mouth daily.    Dispense:  45 tablet    Refill:  3   mirtazapine (REMERON) 15 MG tablet    Sig: Take 1 tablet (15 mg total) by mouth at bedtime.    Dispense:  90 tablet    Refill:  1    Please discontinue trazodone script.    Referral Orders  No referral(s) requested today      Note is  dictated utilizing voice recognition software. Although note has been proof read prior to signing, occasional typographical errors still can be missed. If any questions arise, please do not hesitate to call for verification.  Electronically signed by: Felix Pacini, DO [facility] [place]

## 2023-05-31 NOTE — Patient Instructions (Addendum)
Return in about 24 weeks (around 11/15/2023).  Please always take your medication at least 2 hours before appt so we can make sure treatment is where it needs to be.       Great to see you today.  I have refilled the medication(s) we provide.   If labs were collected or images ordered, we will inform you of  results once we have received them and reviewed. We will contact you either by echart message, or telephone call.  Please give ample time to the testing facility, and our office to run,  receive and review results. Please do not call inquiring of results, even if you can see them in your chart. We will contact you as soon as we are able. If it has been over 1 week since the test was completed, and you have not yet heard from [place], then please call us.    - echart message- for normal results that have been seen by the patient already.   - telephone call: abnormal results or if patient has not viewed results in their echart.  If a referral to a specialist was entered for you, please call us in 2 weeks if you have not heard from the specialist office to schedule.

## 2023-06-07 ENCOUNTER — Telehealth: Payer: Self-pay

## 2023-06-07 NOTE — Telephone Encounter (Signed)
Spoke with patient regarding results/recommendations.  

## 2023-06-07 NOTE — Telephone Encounter (Signed)
Patient states Dr. Claiborne Billings changed her meds at her last visit. She thinks she is taking the wrong ones, and not sure what she was to stop and to start taking.  Please call [phone]

## 2023-06-09 ENCOUNTER — Telehealth: Payer: Self-pay

## 2023-06-09 ENCOUNTER — Other Ambulatory Visit (HOSPITAL_COMMUNITY): Payer: Self-pay

## 2023-06-09 NOTE — Telephone Encounter (Signed)
Pharmacy Patient Advocate Encounter   Received notification from CoverMyMeds that prior authorization for Dicyclomine HCl 10MG  capsules is required/requested.   Insurance verification completed.   The patient is insured through [employer] .   Per test claim: PA required; PA submitted to [facility] via CoverMyMeds Key/confirmation #/EOC [id number] Status is pending

## 2023-06-10 ENCOUNTER — Other Ambulatory Visit (HOSPITAL_COMMUNITY): Payer: Self-pay

## 2023-06-10 NOTE — Telephone Encounter (Signed)
Pharmacy Patient Advocate Encounter  Received notification from [facility] that Prior Authorization for Dicyclomine HCl 10MG  capsules has been APPROVED from 06/09/23 to 10/04/23   PA #/Case ID/Reference #:  [id number]

## 2023-06-20 NOTE — Addendum Note (Signed)
Addended by: Filomena Jungling on: 06/20/2023 02:44 PM   Modules accepted: Orders

## 2023-06-29 ENCOUNTER — Ambulatory Visit: Payer: Medicare HMO | Admitting: Cardiology

## 2023-06-30 ENCOUNTER — Ambulatory Visit: Payer: Medicare HMO | Admitting: Internal Medicine

## 2023-06-30 NOTE — Progress Notes (Deleted)
Cardiology Office Note:    Date:  06/30/2023   ID:  ANEDRA [name], DOB [birth year], MRN [id number]  PCP:  Natalia Leatherwood, DO    [facility] Providers Cardiologist:  None   Referring MD: Natalia Leatherwood, DO    History of Present Illness:    [name] [name] is a 77 y.o. female with a hx of aortic atherosclerosis, HLD, CKD III, depression, and HTN who was previously followed by Dr. Tomie China who now presents to clinic for follow-up.  Last visit with Dr. Tomie China in 03/2021 where she was stable from a CV standpoint. Myoview 2021 normal. TTE 2021 with EF 55-60%, G1DD, normal RV, no significant valve disease.  Today, the patient states that she is overall feeling okay. Has been having episodes of feeling suddenly lightheaded and SOB and she feels like she is going to pass out. No episodes of syncope. Symptoms can occur at rest or with exertion.  Also worsen with stress and anxiety but can happen when she is not stressed or anxious. States symptoms can sometimes resolve with cough. Has had cardiac monitor in the past, but did not have one of these episodes while wearing the monitor.     Notably, she has significant amount of caffeine during her day and admits to not drinking water frequently.   She denies any chest pain but admits to SOB with exertion. No orthopnea, no edema.   Interim hx 06/30/2023   Past Medical History:  Diagnosis Date   Anxiety    Arthritis    oa   At moderate risk for fall 02/04/2021   Atypical lobular hyperplasia [facility]) of left breast 12/06/2019   Body mass index (BMI) 45.0-49.9, adult (HCC) 03/26/2021   Cardiac murmur 08/06/2020   Chronic cystitis    CKD (chronic kidney disease) stage 3, GFR 30-59 ml/min (HCC) 04/13/2017   Cystic thyroid nodule 08/09/2018   7x10 mm, right lobe   DDD (degenerative disc disease), cervical    Depression with anxiety 11/04/2007   Current med: xanax PRN, zoloft 100 mg qd.     Dysphagia 07/28/2018   Essential hypertension  11/04/2007   Current meds: lisinopril/HCTZ 10/12.5 mg qd; tenormin 25 mg qd     Fibromyalgia    Gait instability 01/22/2019   GERD (gastroesophageal reflux disease)    Heart murmur    History of duodenal ulcer    2007   History of gastritis    2007   Hoarseness 08/22/2015   GI- did not feel gerd, ENT did not feel ent, neuro- felt possibly ent--> sent for 2nd opinion ent.    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 This patient is considered osteoporotic according to World Health Organization [facility]) criteria. L-1, L-4 were excluded due to degenerative changes. Per the official positions of the ISCD, it is not possible to quantitatively compare BMD or calculate an [facility] between exams done at different facilities.    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 MRI scheduled. 04/16/2016    Seasonal allergies 02/19/2016   Unspecified venous (peripheral) insufficiency    wears compression hose   Urine incontinence    Vitamin D deficiency 07/20/2008   Taking 1000 iu qd     Vitamin D deficiency, unspecified     Past Surgical History:  Procedure Laterality Date   ANTERIOR AND POSTERIOR VAGINAL REPAIR  04/29/2004   AND TRANSVAGINAL TAPE SLING    ANTERIOR CERVICAL DECOMP/DISCECTOMY FUSION  1999   APPENDECTOMY  1984   BILATERAL SALPINGOOPHORECTOMY  1984   Bladder stimulator     BREAST BIOPSY Right 03/2021   BREAST LUMPECTOMY WITH RADIOACTIVE SEED LOCALIZATION Left 10/31/2019   Procedure: LEFT BREAST LUMPECTOMY WITH RADIOACTIVE SEED LOCALIZATION;  Surgeon: Harriette Bouillon, MD;  Location: [facility];  Service: General;  Laterality: Left;   CARDIAC CATHETERIZATION  10-03-2002   DR Andee Lineman   NORMAL CORONARIES ARTERIES/  EF 55%   CYSTOSCOPY N/A 08/27/2013   Procedure: CYSTOSCOPY;  Surgeon: Kathi Ludwig, MD;  Location: [facility];  Service: Urology;  Laterality: N/A;   CYSTOSCOPY WITH INJECTION N/A 12/17/2013   Procedure: Eustace Pen WITH INJECTION;  Surgeon: Kathi Ludwig, MD;  Location: [facility];  Service: Urology;  Laterality: N/A;   DILATION AND CURETTAGE OF UTERUS     EXCISION RIGHT NECK AND LEFT BREAST SEBACEOUS CYST  05/18/2002   HEMORRHOID BANDING  2014   PUBOVAGINAL SLING N/A 10/25/2016   Procedure: Leonides Grills;  Surgeon: [name], MD;  Location: [facility];  Service: Urology;  Laterality: N/A;   right litlle finger surgery  yrs ago   cyst removed x 4   TOTAL ABDOMINAL HYSTERECTOMY  1986   TUBAL LIGATION     VAGINAL PROLAPSE REPAIR N/A 08/27/2013   Procedure: ANTERIOR VAGINAL VAULT SUSPENSION, KELLY PLICATION WITH SACROSPINOUS LIGAMENT FIXATION AND XENFORM BOVINE DERMIS GRAFT AUGMENTATION, URETHRAL EXPLORATION, URETHROLYSIS, EXPLANTATION OF TVT TAPE, IMPLANTATION OF FLOSEAL, IMPLANTATION OF XENFORM BOVINE GRAFT IN PERIURETHRAL SPACE;  Surgeon: Kathi Ludwig, MD;  Location: [facility];  Service: Urology;  Laterality: N/A   VAGINAL PROLAPSE REPAIR N/A 10/25/2016   Procedure: VAGINAL VAULT SUSPENSION with mesh and sacrospinous repair;  Surgeon: [name], MD;  Location: [facility];  Service: Urology;  Laterality: N/A;     Current Medications: No outpatient medications have been marked as taking for the 06/30/23 encounter (Appointment) with [name], MD.     Allergies:   Meloxicam, Morphine, and Sulfa antibiotics   Social History   Socioeconomic History   Marital status: Divorced    Spouse name: Not on file   Number of children: 1   Years of education: Not on file   Highest education level: 12th grade  Occupational History   Occupation: Retired  Tobacco Use   Smoking status: Former    Current packs/day: 0.00    Average packs/day: 1 pack/day for 15.7 years (15.7 ttl pk-yrs)    Types: Cigarettes    Start date: 12/06/1993    Quit date: 08/09/2009    Years since quitting: 13.8   Smokeless tobacco: Never   Tobacco comments:    quit smoking 5 years ago  Vaping Use   Vaping status: Never Used  Substance and  Sexual Activity   Alcohol use: Not Currently    Comment: ocassional   Drug use: No   Sexual activity: Not Currently    Partners: Male  Other Topics Concern   Not on file  Social History Narrative   She is retired and divorced and has 1 child   Occasional alcohol, former smoker no drug use   Right handed    Caffeine 2-3 cups per day   Lives at alone    Social Determinants of Health   Financial Resource Strain: Low Risk  (10/13/2022)   Overall Financial Resource Strain (CARDIA)    Difficulty of Paying Living Expenses: Not very hard  Food Insecurity: No Food Insecurity (10/13/2022)   Hunger Vital Sign    Worried About Running Out of Food in the Last Year: Never true    Ran Out of Food in the Last Year: Never true  Transportation Needs: No Transportation Needs (10/13/2022)   PRAPARE - Administrator, Civil Service (Medical): No    Lack of Transportation (Non-Medical): No  Physical Activity: Inactive (10/13/2022)   Exercise Vital Sign    Days of Exercise per Week: 0 days    Minutes of Exercise per Session: 0 min  Stress: No Stress Concern Present (10/13/2022)   [employer] of Occupational Health - Occupational Stress Questionnaire    Feeling of Stress : Only a little  Social Connections: Moderately Isolated (10/13/2022)   Social Connection and Isolation Panel [NHANES]    Frequency of Communication with Friends and Family: More than three times a week    Frequency of Social Gatherings with Friends and Family: Once a week    Attends Religious Services: More than 4 times per year    Active Member of [employer] or Organizations: No    Attends Banker Meetings: Never    Marital Status: Divorced     Family History: The patient's family history includes Anemia in her brother and sister; Arthritis in her mother; Cervical cancer in her mother; Colon polyps in an other family member; Coronary artery disease in her father; Dementia in her maternal aunt and maternal aunt; Depression in her sister; Fibromyalgia in her sister; Heart disease in her maternal uncle and mother; Hyperlipidemia in her brother; Hypertension in her brother, brother, mother, and sister; Kidney disease in her sister; Lung cancer in her father, maternal uncle, and paternal uncle; Myelodysplastic syndrome in her mother; Other in her paternal uncle; Pancreatic cancer in her cousin and sister. There is no history of Colon cancer.  ROS:   Please see the history of present illness.     All other systems reviewed and are negative.  EKGs/Labs/Other Studies Reviewed:    The following studies were reviewed today: TTE 09-17-20: IMPRESSIONS     1. Left ventricular ejection fraction, by estimation, is 55 to 60%. The  left ventricle has normal function. The left ventricle has no regional  wall motion abnormalities. There is mild left ventricular hypertrophy.  Left ventricular diastolic parameters  are consistent with Grade I diastolic dysfunction (impaired relaxation).   2. Right ventricular systolic function is normal. The right ventricular  size is normal. Tricuspid regurgitation signal is  inadequate for assessing  PA pressure.   3. The mitral valve is normal in structure. No evidence of mitral valve  regurgitation. No evidence of mitral stenosis.   4. The aortic valve is tricuspid. Aortic valve regurgitation is not  visualized. No aortic stenosis is present.   5. The inferior  vena cava is normal in size with greater than 50%  respiratory variability, suggesting right atrial pressure of 3 mmHg.   Myoview 08/2020:  Nuclear stress EF: 64%. There was no ST segment deviation noted during stress. No T wave inversion was noted during stress. The study is normal. This is a low risk study. The left ventricular ejection fraction is normal (55-65%).   1. Mildly reduced apical counts on rest and stress with normal wall motion consistent with apical thinning artifact.  2. Normal study without ischemia or infarction. 3. Normal LVEF, 64%.  4. This is a low-risk study.   EKG:  EKG is  ordered today.  The ekg ordered today demonstrates NSR with HR 84  Recent Labs: 10/19/2022: ALT 9; BUN 17; Creatinine, Ser 1.29; Potassium 5.0; Sodium 143; TSH 4.08 12/07/2022: Hemoglobin 11.6; Platelets 286.0  Recent Lipid Panel    Component Value Date/Time   CHOL 187 10/19/2022 0851   CHOL 165 03/30/2021 0957   TRIG 185.0 (H) 10/19/2022 0851   HDL 56.90 10/19/2022 0851   HDL 53 03/30/2021 0957   CHOLHDL 3 10/19/2022 0851   VLDL 37.0 10/19/2022 0851   LDLCALC 94 10/19/2022 0851   LDLCALC 88 03/30/2021 0957     Risk Assessment/Calculations:         Physical Exam:    VS:  There were no vitals taken for this visit.    Wt Readings from Last 3 Encounters:  05/31/23 198 lb (89.8 kg)  12/10/22 201 lb 12.8 oz (91.5 kg)  12/07/22 202 lb 3.2 oz (91.7 kg)     GEN:  Well nourished, well developed in no acute distress HEENT: Normal NECK: No JVD CARDIAC: RRR, no murmurs, rubs, gallops RESPIRATORY:  Clear to auscultation without rales, wheezing or rhonchi  ABDOMEN: Soft, non-tender,  non-distended MUSCULOSKELETAL:  No edema; No deformity  SKIN: Warm and dry NEUROLOGIC:  Alert and oriented x 3 PSYCHIATRIC:  Normal affect   ASSESSMENT:   Pre-syncope HTN HLD Obesity  PLAN:    In order of problems listed above:  #Presyncope: Patient having episodes of sudden onset lightheadedness with associated SOB that occur several times per week. Symptoms can happen at rest and with exertion and usually resolve within a couple of minutes. No syncope. Often these episodes correlate with periods of stress/anxiety. Has noticed that they can resolve with coughing. Overall, symptoms concerning for possible arrhythmia vs stress related. She is also having significant caffeine per day and we recommended cutting back.  -zio patch was sent, states exam begun  -Cut back on caffeine -Increase hydration  #HTN: Continue ambulatory monitoring -Continue atenolol 12.5mg  daily -Continue losartan-HCTZ 50-12.5mg  daily  #HLD: #Aortic Atherosclerosis: -Continue pravastatin 40mg  daily -Declined Ca score today  #Obesity with BMI 33: -Discussed lifestyle modifications as below   Medication Adjustments/Labs and Tests Ordered: Current medicines are reviewed at length with the patient today.  Concerns regarding medicines are outlined above.  No orders of the defined types were placed in this encounter.  No orders of the defined types were placed in this encounter.   There are no Patient Instructions on file for this visit.   Signed, [name], MD  06/30/2023 7:53 AM    [facility]

## 2023-08-23 ENCOUNTER — Ambulatory Visit: Payer: Medicare HMO | Admitting: Internal Medicine

## 2023-10-14 ENCOUNTER — Ambulatory Visit (INDEPENDENT_AMBULATORY_CARE_PROVIDER_SITE_OTHER): Payer: Medicare HMO | Admitting: Urgent Care

## 2023-10-14 ENCOUNTER — Encounter: Payer: Self-pay | Admitting: Urgent Care

## 2023-10-14 VITALS — BP 176/103 | HR 97 | Temp 98.0°F | Wt 183.0 lb

## 2023-10-14 DIAGNOSIS — K5900 Constipation, unspecified: Secondary | ICD-10-CM | POA: Diagnosis not present

## 2023-10-14 DIAGNOSIS — R296 Repeated falls: Secondary | ICD-10-CM

## 2023-10-14 DIAGNOSIS — J0100 Acute maxillary sinusitis, unspecified: Secondary | ICD-10-CM

## 2023-10-14 DIAGNOSIS — I10 Essential (primary) hypertension: Secondary | ICD-10-CM | POA: Diagnosis not present

## 2023-10-14 DIAGNOSIS — R112 Nausea with vomiting, unspecified: Secondary | ICD-10-CM | POA: Diagnosis not present

## 2023-10-14 LAB — POC COVID19 BINAXNOW: SARS Coronavirus 2 Ag: NEGATIVE

## 2023-10-14 MED ORDER — GUAIFENESIN ER 600 MG PO TB12
600.0000 mg | ORAL_TABLET | Freq: Two times a day (BID) | ORAL | 0 refills | Status: DC | PRN
Start: 2023-10-14 — End: 2023-11-15

## 2023-10-14 MED ORDER — AZITHROMYCIN 250 MG PO TABS
ORAL_TABLET | ORAL | 0 refills | Status: AC
Start: 2023-10-14 — End: 2023-10-19

## 2023-10-14 MED ORDER — ONDANSETRON 4 MG PO TBDP: 4.0000 mg | ORAL_TABLET | Freq: Three times a day (TID) | ORAL | 0 refills | Status: DC | PRN

## 2023-10-14 NOTE — Progress Notes (Signed)
 Established Patient Office Visit  Subjective:  Patient ID: [name] [name], female    DOB: 01-May-1946  Age: 78 y.o. MRN: [id number]  Chief Complaint  Patient presents with   Nasal Congestion    Sore throat and congestion that started last week. This week she has been having nausea and vomiting along with fatigue. She has not tested for flu or covid   Discussed the use of AI software for clinical note transcription with the patient who gave verbal consent to proceed.   History of Present Illness The patient, a caregiver by profession, initially presented with symptoms suggestive of a sinus infection about a week ago. She experienced a sensation of her head in a bucket, which later progressed to a sore throat and postnasal drainage. The patient also reported chest congestion, but denied any fever throughout the course of the illness.  In addition to these symptoms, the patient experienced daily episodes of nausea and gagging, particularly upon getting up and moving around. This was accompanied by nervousness and shaking of the legs and hands. The patient reported vomiting, mostly in the mornings, not more than twice a day.  The patient has a history of chronic kidney problems and has undergone bladder surgery four times. She reported a slight smell to her urine but denied any pain during urination. The patient also reported constipation, with bowel movements occurring every other day to once a week, and occasional abdominal cramping.  The patient has been self-medicating with over-the-counter medications such as Tylenol , Zyrtec, and a daily sinus pill. She also takes a stool softener and Miralax for constipation, but not on a daily basis.  The patient has experienced several falls recently, with the most recent one occurring on Wednesday. She reported poor balance but denied any memory issues. Her last fall was secondary to tripping on a potty training puppy pad on the floor. No LOC or head  injury.     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   Lumbar pain with radiation down right leg 10/24/2018   DDD (degenerative disc disease), cervical 10/24/2018   Cystic thyroid  nodule 08/09/2018   GERD (gastroesophageal reflux disease) 07/28/2018   Dysphagia 07/28/2018   Palpitations 04/20/2018   CKD (chronic kidney disease) stage 3, GFR 30-59 ml/min (HCC) 04/13/2017   Panic disorder 10/14/2016   Seasonal allergies 02/19/2016   Medicare annual wellness visit, subsequent 11/24/2015   Morbid obesity (HCC) 09/11/2015   Hoarseness 08/22/2015   Insomnia 07/25/2015   Primary osteoarthritis of left knee 03/19/2015   Hypertriglyceridemia 07/31/2014   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/2014   Osteoporosis 02/06/2014   Venous (peripheral) insufficiency 11/16/2010   Vitamin D  deficiency 07/20/2008   Major depressive disorder, recurrent episode, 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/04/2007   Essential hypertension 11/04/2007   Irritable bowel syndrome 11/04/2007   Past Medical History:  Diagnosis Date   Anxiety    Arthritis    oa   At moderate risk for fall 02/04/2021  Atypical lobular hyperplasia [facility]) of left breast 12/06/2019   Body mass index (BMI) 45.0-49.9, adult (HCC) 03/26/2021   Cardiac murmur 08/06/2020   Chronic cystitis    CKD (chronic kidney disease) stage 3, GFR 30-59 ml/min (HCC) 04/13/2017   Cystic thyroid  nodule 08/09/2018   7x10 mm, right lobe   DDD  (degenerative disc disease), cervical    Depression with anxiety 11/04/2007   Current med: xanax  PRN, zoloft  100 mg qd.     Dysphagia 07/28/2018   Essential hypertension 11/04/2007   Current meds: lisinopril /HCTZ 10/12.5 mg qd; tenormin  25 mg qd     Fibromyalgia    Gait instability 01/22/2019   GERD (gastroesophageal reflux disease)    Heart murmur    History of duodenal ulcer    2007   History of gastritis    2007   Hoarseness 08/22/2015   GI- did not feel gerd, ENT did not feel ent, neuro- felt possibly ent--> sent for 2nd opinion ent.    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 This patient is considered osteoporotic according to World Health Organization [facility]) criteria. L-1, L-4 were excluded due to degenerative changes. Per the official positions of the ISCD, it is not possible to quantitatively compare BMD or calculate an [facility] between exams done at different facilities.    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 MRI scheduled. 04/16/2016    Seasonal allergies 02/19/2016   Unspecified venous (peripheral) insufficiency    wears compression hose   Urine incontinence    Vitamin D  deficiency 07/20/2008   Taking 1000 iu qd     Vitamin D   deficiency, unspecified    Social History   Tobacco Use   Smoking status: Former    Current packs/day: 0.00    Average packs/day: 1 pack/day for 15.7 years (15.7 ttl pk-yrs)    Types: Cigarettes    Start date: 12/06/1993    Quit date: 08/09/2009    Years since quitting: 14.1   Smokeless tobacco: Never   Tobacco comments:    quit smoking 5 years ago  Vaping Use   Vaping status: Never Used  Substance Use Topics   Alcohol use: Not Currently    Comment: ocassional   Drug use: No      ROS: as noted in HPI  Objective:     BP (!) 176/103   Pulse 97   Temp 98 F (36.7 C) (Oral)   Wt 183 lb (83 kg)   SpO2 96%   BMI 30.45 kg/m  BP Readings from Last 3 Encounters:  10/14/23 (!) 176/103  05/31/23 (!) 140/78  12/10/22 (!) 142/84   Wt Readings from Last 3 Encounters:  10/14/23 183 lb (83 kg)  05/31/23 198 lb (89.8  kg)  12/10/22 201 lb 12.8 oz (91.5 kg)      Physical Exam Vitals and nursing note reviewed.  Constitutional:      General: She is not in acute distress.    Appearance: Normal appearance. She is obese. She is not ill-appearing, toxic-appearing or diaphoretic.  HENT:     Head: Normocephalic and atraumatic.     Jaw: There is normal jaw occlusion.     Salivary Glands: Right salivary gland is not diffusely enlarged or tender. Left salivary gland is not diffusely enlarged or tender.     Right Ear: No drainage, swelling or tenderness. A middle ear effusion is present. No mastoid tenderness. Tympanic membrane is not injected, perforated, erythematous or bulging.     Left Ear: No drainage, swelling or tenderness. A middle ear effusion is present. No mastoid tenderness. Tympanic membrane is not injected, perforated, erythematous or bulging.     Nose: Congestion and rhinorrhea present. Rhinorrhea is purulent.     Right Turbinates: Swollen.     Left Turbinates: Swollen.     Right Sinus: Maxillary sinus tenderness present. No frontal sinus tenderness.     Left Sinus:  Maxillary sinus tenderness present. No frontal sinus tenderness.     Mouth/Throat:     Lips: Pink.     Mouth: Mucous membranes are moist. No oral lesions.     Pharynx: Oropharynx is clear. Uvula midline. Postnasal drip present. No pharyngeal swelling, oropharyngeal exudate, posterior oropharyngeal erythema or uvula swelling.  Cardiovascular:     Rate and Rhythm: Normal rate and regular rhythm.  Pulmonary:     Effort: Pulmonary effort is normal. No respiratory distress.     Breath sounds: Normal breath sounds. No stridor. No wheezing, rhonchi or rales.  Chest:     Chest wall: No tenderness.  Abdominal:     General: Abdomen is flat. There is no distension.     Palpations: Abdomen is soft. There is no mass.     Tenderness: There is abdominal tenderness (mild generalized discomfort to deep palpation). There is no right CVA tenderness, left CVA tenderness, guarding or rebound.     Comments: Dullness to percussion throughout abdomen  Musculoskeletal:     Cervical back: Normal range of motion and neck supple. No rigidity or tenderness.  Lymphadenopathy:     Cervical: No cervical adenopathy.  Skin:    General: Skin is warm and dry.     Coloration: Skin is not jaundiced.     Findings: No bruising, erythema or rash.  Neurological:     General: No focal deficit present.     Mental Status: She is alert and oriented to person, place, and time.      Results for orders placed or performed in visit on 10/14/23  POC COVID-19 BinaxNow  Result Value Ref Range   SARS Coronavirus 2 Ag Negative Negative    Last CBC Lab Results  Component Value Date   WBC 8.2 12/07/2022   HGB 11.6 (L) 12/07/2022   HCT 35.8 (L) 12/07/2022   MCV 86.5 12/07/2022   MCH 29.6 09/09/2020   RDW 14.0 12/07/2022   PLT 286.0 12/07/2022   Last metabolic panel Lab Results  Component Value Date   GLUCOSE 89 10/19/2022   NA 143 10/19/2022   K 5.0 10/19/2022   CL 104 10/19/2022   CO2 32 10/19/2022   BUN 17  10/19/2022   CREATININE 1.29 (H) 10/19/2022   GFR 40.57 (L) 10/19/2022   CALCIUM 9.5 10/19/2022   PROT  6.3 10/19/2022   ALBUMIN 4.0 10/19/2022   BILITOT 0.3 10/19/2022   ALKPHOS 48 10/19/2022   AST 13 10/19/2022   ALT 9 10/19/2022   ANIONGAP 10 10/26/2019   Last vitamin B12 and Folate Lab Results  Component Value Date   VITAMINB12 >1500 (H) 10/19/2022      The 10-year ASCVD risk score (Arnett DK, et al., 2019) is: 39.5%  Assessment & Plan:  Acute non-recurrent maxillary sinusitis -     POC COVID-19 BinaxNow -     Azithromycin ; Take 2 tablets on day 1, then 1 tablet daily on days 2 through 5  Dispense: 6 tablet; Refill: 0 -     guaiFENesin  ER; Take 1 tablet (600 mg total) by mouth 2 (two) times daily as needed for cough or to loosen phlegm.  Dispense: 20 tablet; Refill: 0  Essential hypertension  Recurrent falls  Nausea and vomiting, unspecified vomiting type -     Ondansetron ; Take 1 tablet (4 mg total) by mouth every 8 (eight) hours as needed for nausea or vomiting.  Dispense: 20 tablet; Refill: 0  Constipation, unspecified constipation type   Assessment and Plan Upper Respiratory Infection Symptoms started a week ago with a sensation of fullness in the head, followed by a sore throat and postnasal drainage. No fever reported. Examination revealed sinus tenderness. -Start Azithromycin  to avoid potential gastrointestinal upset from Augmentin . -Recommend Mucinex  (without a decongestant) to help with postnasal drainage, called in today.  Nausea/Vomiting Intermittent gagging and vomiting for the past four days, possibly related to postnasal drainage. -Prescribe Ondansetron  to be taken first thing in the morning as needed. Sublingual.  Constipation Infrequent bowel movements, sometimes up to a week apart. Abdominal discomfort noted on examination, likely due to significant constipation. -Increase Miralax to one scoop daily. If no improvement in a week, increase to two  scoops daily.  Falls/Balance Issues Patient reported recent falls and unsteadiness on her feet. Urinary frequency, but No memory issues reported. Unlikely to be NPH -she defers a further assessment on this today, will await her f/u in one month with usual provider.  -offered referral to PT, declined.  HTN Pt did not take her BP medications today, states she was too nauseated to do so. Reports good response to BP when taken appropriately. OTC meds to avoid reviewed   No follow-ups on file.   Benton LITTIE Gave, PA

## 2023-10-14 NOTE — Patient Instructions (Signed)
 You have a sinus infection. Your covid test is negative  Please start taking the antibiotic, Azithromycin , per package directions. Take an over the counter probiotic or yogurt daily to help prevent adverse side effects.  Continue taking zyrtec once daily to help clear up the mucous. I have also called in mucinex , which is an expectorant. This makes your phlegm thin and easier to get out.  Use Flonase daily to help with inflammation of the nasal passage. It is also recommended that you use nasal saline/ sinus washes to cleans the sinus passages. Hot steam from a shower or vaporizer may also be beneficial to help open up the upper airway. Eucalyptus can be helpful.  Use zofran  every morning as needed to help with nausea. Place the medication under your tongue, do not swallow it.   Please start taking one capful (17g) of miralax DAILY to help with bowel movements. If this does not cause normalcy, you can increase to two capfuls. Mix with at least 8oz of any beverage.   Please keep your follow up appointment in Feb, or consider a sooner evaluation should your falls continue. Remove all trip hazards such as puppy pads or throw rugs from the floor to prevent this. Wear well fitting shoes or socks with grips on the bottom.  Go home and take your blood pressure medication as prescribed. Please monitor your BP at home and return sooner if it remains elevated. Do not take any OTC medications for colds containing decongestants or sudafed.

## 2023-10-18 ENCOUNTER — Ambulatory Visit: Payer: Medicare HMO | Admitting: Internal Medicine

## 2023-10-19 ENCOUNTER — Ambulatory Visit: Payer: Medicare HMO

## 2023-10-22 ENCOUNTER — Other Ambulatory Visit: Payer: Self-pay | Admitting: Family Medicine

## 2023-10-26 ENCOUNTER — Ambulatory Visit: Payer: Medicare HMO | Admitting: *Deleted

## 2023-10-26 DIAGNOSIS — Z0000 Encounter for general adult medical examination without abnormal findings: Secondary | ICD-10-CM | POA: Diagnosis not present

## 2023-10-26 NOTE — Patient Instructions (Signed)
[name] [name] , Thank you for taking time to come for your Medicare Wellness Visit. I appreciate your ongoing commitment to your health goals. Please review the following plan we discussed and let me know if I can assist you in the future.   Screening recommendations/referrals: Colonoscopy: up to date Mammogram: Education provided Bone Density: up to date Recommended yearly ophthalmology/optometry visit for glaucoma screening and checkup Recommended yearly dental visit for hygiene and checkup  Vaccinations: Influenza vaccine: up to date Pneumococcal vaccine: up to date Tdap vaccine: up to date Shingles vaccine: up to date    Advanced directives: yes    Preventive Care 65 Years and Older, Female Preventive care refers to lifestyle choices and visits with your health care provider that can promote health and wellness. What does preventive care include? A yearly physical exam. This is also called an annual well check. Dental exams once or twice a year. Routine eye exams. Ask your health care provider how often you should have your eyes checked. Personal lifestyle choices, including: Daily care of your teeth and gums. Regular physical activity. Eating a healthy diet. Avoiding tobacco and drug use. Limiting alcohol use. Practicing safe sex. Taking low-dose aspirin every day. Taking vitamin and mineral supplements as recommended by your health care provider. What happens during an annual well check? The services and screenings done by your health care provider during your annual well check will depend on your age, overall health, lifestyle risk factors, and family history of disease. Counseling  Your health care provider may ask you questions about your: Alcohol use. Tobacco use. Drug use. Emotional well-being. Home and relationship well-being. Sexual activity. Eating habits. History of falls. Memory and ability to understand (cognition). Work and work Astronomer. Reproductive  health. Screening  You may have the following tests or measurements: Height, weight, and BMI. Blood pressure. Lipid and cholesterol levels. These may be checked every 5 years, or more frequently if you are over 74 years old. Skin check. Lung cancer screening. You may have this screening every year starting at age 17 if you have a 30-pack-year history of smoking and currently smoke or have quit within the past 15 years. Fecal occult blood test (FOBT) of the stool. You may have this test every year starting at age 39. Flexible sigmoidoscopy or colonoscopy. You may have a sigmoidoscopy every 5 years or a colonoscopy every 10 years starting at age 54. Hepatitis C blood test. Hepatitis B blood test. Sexually transmitted disease (STD) testing. Diabetes screening. This is done by checking your blood sugar (glucose) after you have not eaten for a while (fasting). You may have this done every 1-3 years. Bone density scan. This is done to screen for osteoporosis. You may have this done starting at age 49. Mammogram. This may be done every 1-2 years. Talk to your health care provider about how often you should have regular mammograms. Talk with your health care provider about your test results, treatment options, and if necessary, the need for more tests. Vaccines  Your health care provider may recommend certain vaccines, such as: Influenza vaccine. This is recommended every year. Tetanus, diphtheria, and acellular pertussis (Tdap, Td) vaccine. You may need a Td booster every 10 years. Zoster vaccine. You may need this after age 84. Pneumococcal 13-valent conjugate (PCV13) vaccine. One dose is recommended after age 24. Pneumococcal polysaccharide (PPSV23) vaccine. One dose is recommended after age 62. Talk to your health care provider about which screenings and vaccines you need and how often you  need them. This information is not intended to replace advice given to you by your health care provider.  Make sure you discuss any questions you have with your health care provider. Document Released: 10/17/2015 Document Revised: 06/09/2016 Document Reviewed: 07/22/2015 Elsevier Interactive Patient Education  2017 [employer].  Fall Prevention in the Home Falls can cause injuries. They can happen to people of all ages. There are many things you can do to make your home safe and to help prevent falls. What can I do on the outside of my home? Regularly fix the edges of walkways and driveways and fix any cracks. Remove anything that might make you trip as you walk through a door, such as a raised step or threshold. Trim any bushes or trees on the path to your home. Use bright outdoor lighting. Clear any walking paths of anything that might make someone trip, such as rocks or tools. Regularly check to see if handrails are loose or broken. Make sure that both sides of any steps have handrails. Any raised decks and porches should have guardrails on the edges. Have any leaves, snow, or ice cleared regularly. Use sand or salt on walking paths during winter. Clean up any spills in your garage right away. This includes oil or grease spills. What can I do in the bathroom? Use night lights. Install grab bars by the toilet and in the tub and shower. Do not use towel bars as grab bars. Use non-skid mats or decals in the tub or shower. If you need to sit down in the shower, use a plastic, non-slip stool. Keep the floor dry. Clean up any water that spills on the floor as soon as it happens. Remove soap buildup in the tub or shower regularly. Attach bath mats securely with double-sided non-slip rug tape. Do not have throw rugs and other things on the floor that can make you trip. What can I do in the bedroom? Use night lights. Make sure that you have a light by your bed that is easy to reach. Do not use any sheets or blankets that are too big for your bed. They should not hang down onto the floor. Have a  firm chair that has side arms. You can use this for support while you get dressed. Do not have throw rugs and other things on the floor that can make you trip. What can I do in the kitchen? Clean up any spills right away. Avoid walking on wet floors. Keep items that you use a lot in easy-to-reach places. If you need to reach something above you, use a strong step stool that has a grab bar. Keep electrical cords out of the way. Do not use floor polish or wax that makes floors slippery. If you must use wax, use non-skid floor wax. Do not have throw rugs and other things on the floor that can make you trip. What can I do with my stairs? Do not leave any items on the stairs. Make sure that there are handrails on both sides of the stairs and use them. Fix handrails that are broken or loose. Make sure that handrails are as long as the stairways. Check any carpeting to make sure that it is firmly attached to the stairs. Fix any carpet that is loose or worn. Avoid having throw rugs at the top or bottom of the stairs. If you do have throw rugs, attach them to the floor with carpet tape. Make sure that you have a light switch  at the top of the stairs and the bottom of the stairs. If you do not have them, ask someone to add them for you. What else can I do to help prevent falls? Wear shoes that: Do not have high heels. Have rubber bottoms. Are comfortable and fit you well. Are closed at the toe. Do not wear sandals. If you use a stepladder: Make sure that it is fully opened. Do not climb a closed stepladder. Make sure that both sides of the stepladder are locked into place. Ask someone to hold it for you, if possible. Clearly mark and make sure that you can see: Any grab bars or handrails. First and last steps. Where the edge of each step is. Use tools that help you move around (mobility aids) if they are needed. These include: Canes. Walkers. Scooters. Crutches. Turn on the lights when you  go into a dark area. Replace any light bulbs as soon as they burn out. Set up your furniture so you have a clear path. Avoid moving your furniture around. If any of your floors are uneven, fix them. If there are any pets around you, be aware of where they are. Review your medicines with your doctor. Some medicines can make you feel dizzy. This can increase your chance of falling. Ask your doctor what other things that you can do to help prevent falls. This information is not intended to replace advice given to you by your health care provider. Make sure you discuss any questions you have with your health care provider. Document Released: 07/17/2009 Document Revised: 02/26/2016 Document Reviewed: 10/25/2014 Elsevier Interactive Patient Education  2017 [employer].

## 2023-10-26 NOTE — Progress Notes (Signed)
Subjective:   [name] [name] is a 78 y.o. female who presents for Medicare Annual (Subsequent) preventive examination.  Visit Complete: Virtual I connected with  [name] [name] on 10/26/23 by a audio enabled telemedicine application and verified that I am speaking with the correct person using two identifiers.  Patient Location: Home  Provider Location: Home Office  I discussed the limitations of evaluation and management by telemedicine. The patient expressed understanding and agreed to proceed.  Vital Signs: Because this visit was a virtual/telehealth visit, some criteria may be missing or patient reported. Any vitals not documented were not able to be obtained and vitals that have been documented are patient reported.  Patient Medicare AWV questionnaire was completed by the patient on 10-24-2023; I have confirmed that all --information answered by patient is correct and  since this date.  [place]ale to connect to video  Cardiac Risk Factors include: advanced age (>21men, >39 women);family history of premature cardiovascular disease;hypertension     Objective:    There were no vitals filed for this visit. There is no height or weight on file to calculate BMI.     10/26/2023    2:39 PM 10/13/2022    9:09 AM 09/16/2021   10:19 AM 06/25/2020   12:54 PM 10/31/2019    7:00 AM 10/24/2019    2:14 PM 01/22/2019    3:42 PM  Advanced Directives  Does Patient Have a Medical Advance Directive? Yes Yes Yes Yes Yes Yes Yes  Type of Estate agent of [employer] Power of [place];Living will Healthcare Power of [employer] of [place];Living will Healthcare Power of [place];Living will Healthcare Power of [place];Living will Living will;Healthcare Power of Attorney  Does patient want to make changes to medical advance directive?  No - Patient declined   No - Patient declined No - Guardian declined No - Patient declined  Copy of Healthcare Power of Attorney  in Chart? No - copy requested Yes - validated most recent copy scanned in chart (See row information) Yes - validated most recent copy scanned in chart (See row information) Yes - validated most recent copy scanned in chart (See row information) Yes - validated most recent copy scanned in chart (See row information) Yes - validated most recent copy scanned in chart (See row information) No - copy requested  Would patient like information on creating a medical advance directive?      No - Guardian declined     Current Medications (verified) Outpatient Encounter Medications as of 10/26/2023  Medication Sig   atenolol (TENORMIN) 25 MG tablet Take 0.5 tablets (12.5 mg total) by mouth daily.   CEQUA 0.09 % SOLN Apply 1 drop to eye 2 (two) times daily.   cetirizine (ZYRTEC) 10 MG tablet Take 10 mg by mouth daily as needed for allergies.   cholecalciferol (VITAMIN D3) 25 MCG (1000 UNIT) tablet Take 1,000 Units by mouth daily.   Cyanocobalamin (VITAMIN B-12) 1000 MCG SUBL Place 1 tablet under the tongue daily.    dicyclomine (BENTYL) 10 MG capsule Take 1 capsule (10 mg total) by mouth 4 (four) times daily -  before meals and at bedtime.   DULoxetine (CYMBALTA) 60 MG capsule Take 1 capsule (60 mg total) by mouth daily.   escitalopram (LEXAPRO) 20 MG tablet Take 1 tablet (20 mg total) by mouth daily.   fesoterodine (TOVIAZ) 8 MG TB24 tablet    fluticasone (FLONASE) 50 MCG/ACT nasal spray Place 1 spray into both nostrils as needed  for allergies.   guaiFENesin (MUCINEX) 600 MG 12 hr tablet Take 1 tablet (600 mg total) by mouth 2 (two) times daily as needed for cough or to loosen phlegm.   iron polysaccharides (NIFEREX) 150 MG capsule TAKE 1 CAPSULE ON MONDAY, WEDNESDAY AND FRIDAY.   latanoprost (XALATAN) 0.005 % ophthalmic solution Place 1 drop into the left eye at bedtime.   LORazepam (ATIVAN) 0.5 MG tablet Take 1 tablet (0.5 mg total) by mouth every 12 (twelve) hours as needed for anxiety (for panic  attack).   losartan-hydrochlorothiazide (HYZAAR) 50-12.5 MG tablet Take 0.5 tablets by mouth daily.   mirtazapine (REMERON) 15 MG tablet Take 1 tablet (15 mg total) by mouth at bedtime.   Multiple Vitamins-Minerals (WOMENS MULTIVITAMIN PLUS PO) Take 1 tablet by mouth daily.   Omega-3 Fatty Acids (FISH OIL PO) Take 1 capsule by mouth daily.    omeprazole (PRILOSEC) 40 MG capsule Take 1 capsule (40 mg total) by mouth 2 (two) times daily before a meal. 30 mins before breakfast and supper   ondansetron (ZOFRAN-ODT) 4 MG disintegrating tablet Take 1 tablet (4 mg total) by mouth every 8 (eight) hours as needed for nausea or vomiting.   pravastatin (PRAVACHOL) 40 MG tablet Take 1 tablet (40 mg total) by mouth daily.   tamoxifen (NOLVADEX) 20 MG tablet Take 20 mg by mouth daily.    traMADol (ULTRAM) 50 MG tablet Take 1 tablet (50 mg total) by mouth every 8 (eight) hours.   Turmeric Curcumin 500 MG CAPS Take 1,000 mg by mouth daily.    No facility-administered encounter medications on file as of 10/26/2023.    Allergies (verified) Meloxicam, Morphine, and Sulfa antibiotics   History: Past Medical History:  Diagnosis Date   Anxiety    Arthritis    oa   At moderate risk for fall 02/04/2021   Atypical lobular hyperplasia [facility]) of left breast 12/06/2019   Body mass index (BMI) 45.0-49.9, adult (HCC) 03/26/2021   Cardiac murmur 08/06/2020   Chronic cystitis    CKD (chronic kidney disease) stage 3, GFR 30-59 ml/min (HCC) 04/13/2017   Cystic thyroid nodule 08/09/2018   7x10 mm, right lobe   DDD (degenerative disc disease), cervical    Depression with anxiety 11/04/2007   Current med: xanax PRN, zoloft 100 mg qd.     Dysphagia 07/28/2018   Essential hypertension 11/04/2007   Current meds: lisinopril/HCTZ 10/12.5 mg qd; tenormin 25 mg qd     Fibromyalgia    Gait instability 01/22/2019   GERD (gastroesophageal reflux disease)    Heart murmur    History of duodenal ulcer    2007   History of  gastritis    2007   Hoarseness 08/22/2015   GI- did not feel gerd, ENT did not feel ent, neuro- felt possibly ent--> sent for 2nd opinion ent.    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 This patient is considered osteoporotic according to [employer] (  WHO) criteria. L-1, L-4 were excluded due to degenerative changes. Per the official positions of the ISCD, it is not possible to quantitatively compare BMD or calculate an [facility] between exams done at different facilities.    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 MRI scheduled. 04/16/2016    Seasonal allergies 02/19/2016   Unspecified venous (peripheral) insufficiency    wears compression hose   Urine incontinence    Vitamin D deficiency 07/20/2008   Taking 1000 iu qd     Vitamin D deficiency, unspecified    Past Surgical History:  Procedure Laterality Date   ANTERIOR AND POSTERIOR VAGINAL REPAIR  04/29/2004   AND TRANSVAGINAL TAPE SLING   ANTERIOR CERVICAL DECOMP/DISCECTOMY FUSION  1999   APPENDECTOMY  1984   BILATERAL SALPINGOOPHORECTOMY  1984   Bladder stimulator     BREAST BIOPSY Right 03/2021   BREAST LUMPECTOMY WITH RADIOACTIVE SEED LOCALIZATION Left 10/31/2019    Procedure: LEFT BREAST LUMPECTOMY WITH RADIOACTIVE SEED LOCALIZATION;  Surgeon: Harriette Bouillon, MD;  Location: [facility];  Service: General;  Laterality: Left;   CARDIAC CATHETERIZATION  10-03-2002   DR Andee Lineman   NORMAL CORONARIES ARTERIES/  EF 55%   CYSTOSCOPY N/A 08/27/2013   Procedure: CYSTOSCOPY;  Surgeon: Kathi Ludwig, MD;  Location: [facility];  Service: Urology;  Laterality: N/A;   CYSTOSCOPY WITH INJECTION N/A 12/17/2013   Procedure: Eustace Pen WITH INJECTION;  Surgeon: Kathi Ludwig, MD;  Location: [facility];  Service: Urology;  Laterality: N/A;   DILATION AND CURETTAGE OF UTERUS     EXCISION RIGHT NECK AND LEFT BREAST SEBACEOUS CYST  05/18/2002   HEMORRHOID BANDING  2014   PUBOVAGINAL SLING N/A 10/25/2016   Procedure: Leonides Grills;  Surgeon: [name], MD;  Location: [facility];  Service: Urology;  Laterality: N/A;   right litlle finger surgery  yrs ago   cyst removed x 4   TOTAL ABDOMINAL HYSTERECTOMY  1986   TUBAL LIGATION     VAGINAL PROLAPSE REPAIR N/A 08/27/2013   Procedure: ANTERIOR VAGINAL VAULT SUSPENSION, KELLY PLICATION WITH SACROSPINOUS LIGAMENT FIXATION AND XENFORM BOVINE DERMIS GRAFT AUGMENTATION, URETHRAL EXPLORATION, URETHROLYSIS, EXPLANTATION OF TVT TAPE, IMPLANTATION OF FLOSEAL, IMPLANTATION OF XENFORM BOVINE GRAFT IN PERIURETHRAL SPACE;  Surgeon: Kathi Ludwig, MD;  Location[facility];  Service: Urology;  Laterality: N/A   VAGINAL PROLAPSE REPAIR N/A 10/25/2016   Procedure: VAGINAL VAULT SUSPENSION with mesh and sacrospinous repair;  Surgeon: [name], MD;  Location: [facility];  Service: Urology;  Laterality: N/A;   Family History  Problem Relation Age of Onset   Hypertension Mother    Heart disease Mother    Myelodysplastic syndrome Mother    Arthritis Mother    Cervical cancer Mother    Lung cancer Father    Coronary artery disease Father     Fibromyalgia Sister    Hypertension Sister    Anemia Sister    Depression Sister    Pancreatic cancer Sister        died 2023-02-133 mos after dx   Kidney disease Sister        kidney stones, removed kidney   Hypertension Brother    Anemia Brother    Hypertension Brother    Hyperlipidemia  Brother    Dementia Maternal Aunt    Dementia Maternal Aunt    Heart disease Maternal Uncle    Lung cancer Maternal Uncle    Lung cancer Paternal Uncle    Other Paternal Uncle        brain Tumor   Pancreatic cancer Cousin        maternal   Colon polyps Other        aunt   Colon cancer Neg Hx    Social History   Socioeconomic History   Marital status: Divorced    Spouse name: Not on file   Number of children: 1   Years of education: Not on file   Highest education level: 12th grade  Occupational History   Occupation: Retired  Tobacco Use   Smoking status: Former    Current packs/day: 0.00    Average packs/day: 1 pack/day for 15.7 years (15.7 ttl pk-yrs)    Types: Cigarettes    Start date: 12/06/1993    Quit date: 08/09/2009    Years since quitting: 14.2   Smokeless tobacco: Never   Tobacco comments:    quit smoking 5 years ago  Vaping Use   Vaping status: Never Used  Substance and Sexual Activity   Alcohol use: Not Currently    Comment: ocassional   Drug use: No   Sexual activity: Not Currently    Partners: Male  Other Topics Concern   Not on file  Social History Narrative   She is retired and divorced and has 1 child   Occasional alcohol, former smoker no drug use   Right handed    Caffeine 2-3 cups per day   Lives at alone    Social Drivers of Health   Financial Resource Strain: Low Risk  (10/26/2023)   Overall Financial Resource Strain (CARDIA)    Difficulty of Paying Living Expenses: Not hard at all  Food Insecurity: No Food Insecurity (10/26/2023)   Hunger Vital Sign    Worried About Running Out of Food in the Last Year: Never true    Ran Out of Food in the Last  Year: Never true  Transportation Needs: No Transportation Needs (10/26/2023)   PRAPARE - Administrator, Civil Service (Medical): No    Lack of Transportation (Non-Medical): No  Physical Activity: Inactive (10/26/2023)   Exercise Vital Sign    Days of Exercise per Week: 0 days    Minutes of Exercise per Session: 0 min  Stress: Stress Concern Present (10/26/2023)   [employer] of Occupational Health - Occupational Stress Questionnaire    Feeling of Stress : To some extent  Social Connections: Moderately Isolated (10/26/2023)   Social Connection and Isolation Panel [NHANES]    Frequency of Communication with Friends and Family: Twice a week    Frequency of Social Gatherings with Friends and Family: Once a week    Attends Religious Services: 1 to 4 times per year    Active Member of [employer] or Organizations: No    Attends Engineer, structural: Never    Marital Status: Divorced    Tobacco Counseling Counseling given: Not Answered Tobacco comments: quit smoking 5 years ago   Clinical Intake:  Pre-visit preparation completed: Yes  Pain : No/denies pain     Diabetes: No  How often do you need to have someone help you when you read instructions, pamphlets, or other written materials from your doctor or pharmacy?: 1 - Never  Interpreter Needed?: No  Information entered  by :Remi Haggard LPN   Activities of Daily Living    10/26/2023    2:41 PM 10/24/2023    1:54 PM  In your present state of health, do you have any difficulty performing the following activities:  Hearing? 0 0  Vision? 1 1  Difficulty concentrating or making decisions? 0 0  Walking or climbing stairs? 1 1  Dressing or bathing? 0 0  Doing errands, shopping? 0 0  Preparing Food and eating ? N N  Using the Toilet? N N  In the past six months, have you accidently leaked urine? Y Y  Do you have problems with loss of bowel control? N N  Managing your Medications? N N  Managing your  Finances? N N  Housekeeping or managing your Housekeeping? N N    Patient Care Team: Natalia Leatherwood, DO as PCP - General (Family Medicine) Iva Boop, MD as Consulting Physician (Gastroenterology) Nelson Chimes, MD as Consulting Physician (Ophthalmology) [facility], [facility], Hennie Duos, MD (Inactive) as Consulting Physician (Neurology)  Indicate any recent Medical Services you may have received from other than [facility] providers in the past year (date may be approximate).     Assessment:   This is a routine wellness examination for [place].  Hearing/Vision screen Hearing Screening - Comments:: No trouble hearing Vision Screening - Comments:: [facility] Up to date Was told not to drive at night anymore   Goals Addressed             This Visit's Progress    Patient Stated       Would like to not fall as much Increase physical activity       Depression Screen    10/26/2023    2:42 PM 05/31/2023   10:24 AM 05/18/2023    9:22 AM 12/07/2022    9:37 AM 10/19/2022    8:29 AM 10/13/2022    9:04 AM 05/04/2022   10:36 AM  PHQ 2/9 Scores  PHQ - 2 Score 2 0  4 4 5 5   PHQ- 9 Score 4 3  10 11 6 10   Exception Documentation   Other- indicate reason in comment box      Not completed   no show        Fall Risk    10/26/2023    2:37 PM 10/24/2023    1:54 PM 05/31/2023   10:24 AM 12/07/2022    9:36 AM 10/13/2022    9:07 AM  Fall Risk   Falls in the past year? 1 1 1  0 1  Number falls in past yr: 1 1 1  0 0  Injury with Fall? 1 0 0 0 0  Risk for fall due to : Impaired balance/gait  History of fall(s) History of fall(s)   Follow up Falls evaluation completed;Education provided;Falls prevention discussed  Falls evaluation completed Falls evaluation completed Falls evaluation completed    MEDICARE RISK AT HOME: Medicare Risk at Home Any stairs in or around the home?: Yes If so, are there  any without handrails?: Yes Home free of loose throw rugs in walkways, pet beds, electrical cords, etc?: Yes Adequate lighting in your home to reduce risk of falls?: Yes Life alert?: No Use of a cane, walker or w/c?: No Grab bars in the bathroom?: No Shower chair or bench in shower?: No Elevated toilet seat or a handicapped toilet?: Yes  TIMED UP AND GO:  Was the test performed?  No    Cognitive Function:    01/16/2018    8:37 AM 11/24/2015    8:36 AM  MMSE - Mini Mental State Exam  Orientation to time 5 5  Orientation to Place 5 5  Registration 3 3  Attention/ Calculation 5 5  Recall 3 3  Language- name 2 objects 2 2  Language- repeat 1 1  Language- follow 3 step command 3 3  Language- read & follow direction 1 1  Write a sentence 1 1  Copy design 1 1  Total score 30 30        10/26/2023    2:40 PM 10/13/2022    9:08 AM  6CIT Screen  What Year? 0 points 0 points  What month? 0 points 0 points  What time? 0 points 0 points  Count back from 20 2 points 0 points  Months in reverse 0 points 0 points  Repeat phrase 0 points 0 points  Total Score 2 points 0 points    Immunizations Immunization History  Administered Date(s) Administered   Fluad Quad(high Dose 65+) 07/04/2022   Fluad Trivalent(High Dose 65+) 05/31/2023   Influenza Split 07/02/2011   Influenza Whole 07/16/2008, 07/04/2009, 07/14/2010   Influenza, High Dose Seasonal PF 07/04/2018, 06/20/2019   Influenza,inj,Quad PF,6+ Mos 06/29/2013, 07/08/2014, 06/23/2017   Influenza-Unspecified 07/02/2015, 06/22/2019, 04/30/2021   Moderna Sars-Covid-2 Vaccination 12/20/2019, 01/17/2020, 05/23/2020, 04/30/2021   Pneumococcal Conjugate-13 05/15/2014   Pneumococcal Polysaccharide-23 10/04/2006, 09/11/2015   Td 03/19/2010   Tdap 03/10/2020   Unspecified SARS-COV-2 Vaccination 07/06/2022   Zoster Recombinant(Shingrix) 07/07/2019, 09/05/2019   Zoster, Live 07/25/2015    TDAP status: Up to date  Flu Vaccine status:  Up to date  Pneumococcal vaccine status: Up to date  Covid-19 vaccine status: Completed vaccines  Qualifies for Shingles Vaccine? No   Zostavax completed Yes   Shingrix Completed?: Yes  Screening Tests Health Maintenance  Topic Date Due   MAMMOGRAM  02/20/2023   DEXA SCAN  05/12/2024   Medicare Annual Wellness (AWV)  10/25/2024   DTaP/Tdap/Td (3 - Td or Tdap) 03/10/2030   Pneumonia Vaccine 76+ Years old  Completed   INFLUENZA VACCINE  Completed   Hepatitis C Screening  Completed   Zoster Vaccines- Shingrix  Completed   HPV VACCINES  Aged Out   COVID-19 Vaccine  Discontinued    Health Maintenance  Health Maintenance Due  Topic Date Due   MAMMOGRAM  02/20/2023    Colorectal cancer screening: No longer required.   Mammogram status: Ordered  . Pt provided with contact info and advised to call to schedule appt.   Bone Density status: Completed 2023. Results reflect: Bone density results: OSTEOPOROSIS. Repeat every 2 years.  Lung Cancer Screening: (Low Dose CT Chest recommended if Age 13-80 years, 20 pack-year currently smoking OR have quit w/in 15years.) does not qualify.   Lung Cancer Screening Referral:   Additional Screening:  Hepatitis C Screening: does not qualify; Completed 2016  Vision Screening: Recommended annual ophthalmology exams for early detection of glaucoma and other disorders of the eye. Is the patient up to date with their annual eye exam?  Yes  Who is the provider or what is the name of the office in which the patient attends annual eye exams? [facility] If pt is not established with a provider, would they like to be referred to a provider to establish care? No .   Dental Screening: Recommended annual dental exams for proper oral hygiene    Community Resource Referral /  Chronic Care Management: CRR required this visit?  No   CCM required this visit?  No     Plan:     I have personally reviewed and noted the following in the patient's chart:    Medical and social history Use of alcohol, tobacco or illicit drugs  Current medications and supplements including opioid prescriptions. Patient is not currently taking opioid prescriptions. Functional ability and status Nutritional status Physical activity Advanced directives List of other physicians Hospitalizations, surgeries, and ER visits in previous 12 months Vitals Screenings to include cognitive, depression, and falls Referrals and appointments  In addition, I have reviewed and discussed with patient certain preventive protocols, quality metrics, and best practice recommendations. A written personalized care plan for preventive services as well as general preventive health recommendations were provided to patient.     Remi Haggard, LPN   [id number]   After Visit Summary: (MyChart) Due to this being a telephonic visit, the after visit summary with patients personalized plan was offered to patient via MyChart   Nurse Notes:

## 2023-10-27 ENCOUNTER — Ambulatory Visit: Payer: Medicare HMO | Admitting: Internal Medicine

## 2023-11-04 DIAGNOSIS — I10 Essential (primary) hypertension: Secondary | ICD-10-CM | POA: Diagnosis not present

## 2023-11-04 DIAGNOSIS — Z882 Allergy status to sulfonamides status: Secondary | ICD-10-CM | POA: Diagnosis not present

## 2023-11-04 DIAGNOSIS — M1712 Unilateral primary osteoarthritis, left knee: Secondary | ICD-10-CM | POA: Diagnosis not present

## 2023-11-04 DIAGNOSIS — Z885 Allergy status to narcotic agent status: Secondary | ICD-10-CM | POA: Diagnosis not present

## 2023-11-04 DIAGNOSIS — Z87891 Personal history of nicotine dependence: Secondary | ICD-10-CM | POA: Diagnosis not present

## 2023-11-04 DIAGNOSIS — M1990 Unspecified osteoarthritis, unspecified site: Secondary | ICD-10-CM | POA: Diagnosis not present

## 2023-11-04 DIAGNOSIS — M2392 Unspecified internal derangement of left knee: Secondary | ICD-10-CM | POA: Diagnosis not present

## 2023-11-05 DIAGNOSIS — I10 Essential (primary) hypertension: Secondary | ICD-10-CM | POA: Diagnosis not present

## 2023-11-05 DIAGNOSIS — S8992XA Unspecified injury of left lower leg, initial encounter: Secondary | ICD-10-CM | POA: Diagnosis not present

## 2023-11-05 DIAGNOSIS — Z87891 Personal history of nicotine dependence: Secondary | ICD-10-CM | POA: Diagnosis not present

## 2023-11-05 DIAGNOSIS — M25462 Effusion, left knee: Secondary | ICD-10-CM | POA: Diagnosis not present

## 2023-11-09 DIAGNOSIS — M1712 Unilateral primary osteoarthritis, left knee: Secondary | ICD-10-CM | POA: Diagnosis not present

## 2023-11-10 ENCOUNTER — Telehealth: Payer: Self-pay

## 2023-11-10 NOTE — Telephone Encounter (Signed)
 [facility] Day - Client TELEPHONE ADVICE RECORD AccessNurse Patient Name First: [name] Last: [name] Gender: Female DOB: 11-12-1945 Age: 78 Y 7 M 20 D Return Phone Number: [phone] (Primary) Address: City/ State/ Zip: [place]  [address] Client [facility] Day - Client Client Site [facility] - Day Provider [place], [place] Contact Type Call Who Is Calling Patient / Member / Family / Caregiver Call Type Triage / Clinical Relationship To Patient Self Return Phone Number [phone] (Primary) Chief Complaint Leg Pain Reason for Call Symptomatic / Request for Health Information Initial Comment Caller states she twisted her knee last night and went to the hospital and was informed she may have torn her meniscus. Caller states the pain in her leg is so bad she wants to scream. Caller would like to know if there is anything she can take to ease her pain. Translation No Nurse Assessment Nurse: Junita, RN, Warren Date/Time (Eastern Time): 11/05/2023 10:26:17 AM Confirm and document reason for call. If symptomatic, describe symptoms. ---Caller states she was seen in the ED last night after she injured her knee. She was told she has a torn meniscus. She is having severe pain in her knee. Does the patient have any new or worsening symptoms? ---Yes Will a triage be completed? ---Yes Related visit to physician within the last 2 weeks? ---Yes Does the PT have any chronic conditions? (i.e. diabetes, asthma, this includes High risk factors for pregnancy, etc.) ---No Is this a behavioral health or substance abuse call? ---No Guidelines Guideline Title Affirmed Question Affirmed Notes Nurse Date/Time Titus Time) Recent Medical Visit for Injury Follow-up Call [1] SEVERE pain (e.g., excruciating, pain scale 8-10) AND [2] not improved after pain medications Junita, RN, Jamie 11/05/2023 10:30:00 AM Disp. Time  Titus Time) Disposition Final User 11/05/2023 10:35:37 AM Call PCP Now Yes Junita, RN, Warren PLEASE NOTE: All timestamps contained within this report are represented as Eastern Standard Time. CONFIDENTIALTY NOTICE: This fax transmission is intended only for the addressee. It contains information that is legally privileged, confidential or otherwise protected from use or disclosure. If you are not the intended recipient, you are strictly prohibited from reviewing, disclosing, copying using or disseminating any of this information or taking any action in reliance on or regarding this information. If you have received this fax in error, please notify us  immediately by telephone so that we can arrange for its return to us . Phone: [phone], Toll-Free: [phone], Fax: [phone] Page: 2 of 2 Call Id: [id number] Disp. Time Titus Time) Disposition Final User 11/05/2023 10:36:55 AM Paged On Call back to [facility], RN, Chance Final Disposition 11/05/2023 10:35:37 AM Call PCP Now Yes Junita, RN, Warren [place] Disagree/Comply Comply Caller Understands Yes PreDisposition InappropriateToAsk Care Advice Given Per Guideline * You need to discuss this with your doctor (or NP/PA). * I'll page the on-call provider now. If you haven't heard from the provider (or me) within 30 minutes, call again. CALL PCP NOW: * ACETAMINOPHEN  - EXTRA STRENGTH TYLENOL : Take 1,000 mg (two 500 mg pills) every 6 to 8 hours as needed. Each Extra Strength Tylenol  pill has 500 mg of acetaminophen . The most you should take is 6 pills a day (3,000 mg total). Note: In [place], the maximum is 8 pills a day (4,000 mg total). CALL BACK IF: * You become worse Comments User: Warren Junita, RN Date/Time Titus Time): 11/05/2023 10:30:35 AM Tylenol  and tramadol  haven't helped User: Warren Junita, RN Date/Time Titus  Time): 11/05/2023 10:43:27 AM Patient is going to [facility] ED, she has new numbness and blue in her  toes. Paging DoctorName Phone DateTime Result/ Outcome Message Type Notes Johnny Senior - MD [phone] 11/05/2023 10:36:55 AM Paged On Call Back to Call Center Doctor Paged [place], [place] [phone] Johnny Senior - MD 11/05/2023 10:43:44 AM Spoke with On Call - General Message Result send to ED

## 2023-11-12 ENCOUNTER — Other Ambulatory Visit: Payer: Self-pay | Admitting: Family Medicine

## 2023-11-14 NOTE — Telephone Encounter (Signed)
Pt has OV tomorrow.

## 2023-11-15 ENCOUNTER — Ambulatory Visit (INDEPENDENT_AMBULATORY_CARE_PROVIDER_SITE_OTHER): Payer: Self-pay | Admitting: Family Medicine

## 2023-11-15 DIAGNOSIS — F410 Panic disorder [episodic paroxysmal anxiety] without agoraphobia: Secondary | ICD-10-CM

## 2023-11-15 DIAGNOSIS — Z91199 Patient's noncompliance with other medical treatment and regimen due to unspecified reason: Secondary | ICD-10-CM

## 2023-11-15 NOTE — Progress Notes (Signed)
   Patient Same day cancel

## 2023-11-15 NOTE — Patient Instructions (Signed)

## 2023-11-23 ENCOUNTER — Ambulatory Visit: Payer: Medicare HMO | Admitting: Family Medicine

## 2023-11-23 DIAGNOSIS — F410 Panic disorder [episodic paroxysmal anxiety] without agoraphobia: Secondary | ICD-10-CM

## 2023-11-25 ENCOUNTER — Ambulatory Visit: Payer: Medicare HMO | Admitting: Family Medicine

## 2023-11-25 ENCOUNTER — Encounter: Payer: Self-pay | Admitting: Family Medicine

## 2023-11-25 VITALS — BP 136/84 | HR 82 | Temp 98.3°F | Wt 200.4 lb

## 2023-11-25 DIAGNOSIS — F410 Panic disorder [episodic paroxysmal anxiety] without agoraphobia: Secondary | ICD-10-CM | POA: Diagnosis not present

## 2023-11-25 DIAGNOSIS — Z79899 Other long term (current) drug therapy: Secondary | ICD-10-CM

## 2023-11-25 DIAGNOSIS — R002 Palpitations: Secondary | ICD-10-CM | POA: Diagnosis not present

## 2023-11-25 DIAGNOSIS — F330 Major depressive disorder, recurrent, mild: Secondary | ICD-10-CM | POA: Diagnosis not present

## 2023-11-25 DIAGNOSIS — E781 Pure hyperglyceridemia: Secondary | ICD-10-CM

## 2023-11-25 DIAGNOSIS — I10 Essential (primary) hypertension: Secondary | ICD-10-CM | POA: Diagnosis not present

## 2023-11-25 DIAGNOSIS — M545 Low back pain, unspecified: Secondary | ICD-10-CM

## 2023-11-25 DIAGNOSIS — M79604 Pain in right leg: Secondary | ICD-10-CM | POA: Diagnosis not present

## 2023-11-25 DIAGNOSIS — E559 Vitamin D deficiency, unspecified: Secondary | ICD-10-CM

## 2023-11-25 DIAGNOSIS — M810 Age-related osteoporosis without current pathological fracture: Secondary | ICD-10-CM

## 2023-11-25 DIAGNOSIS — K219 Gastro-esophageal reflux disease without esophagitis: Secondary | ICD-10-CM

## 2023-11-25 DIAGNOSIS — M797 Fibromyalgia: Secondary | ICD-10-CM

## 2023-11-25 DIAGNOSIS — I700 Atherosclerosis of aorta: Secondary | ICD-10-CM

## 2023-11-25 DIAGNOSIS — G4700 Insomnia, unspecified: Secondary | ICD-10-CM

## 2023-11-25 DIAGNOSIS — M1712 Unilateral primary osteoarthritis, left knee: Secondary | ICD-10-CM | POA: Diagnosis not present

## 2023-11-25 DIAGNOSIS — N1832 Chronic kidney disease, stage 3b: Secondary | ICD-10-CM

## 2023-11-25 DIAGNOSIS — Z0289 Encounter for other administrative examinations: Secondary | ICD-10-CM

## 2023-11-25 DIAGNOSIS — K580 Irritable bowel syndrome with diarrhea: Secondary | ICD-10-CM

## 2023-11-25 DIAGNOSIS — F419 Anxiety disorder, unspecified: Secondary | ICD-10-CM

## 2023-11-25 LAB — COMPREHENSIVE METABOLIC PANEL
ALT: 8 U/L (ref 0–35)
AST: 11 U/L (ref 0–37)
Albumin: 4.1 g/dL (ref 3.5–5.2)
Alkaline Phosphatase: 60 U/L (ref 39–117)
BUN: 29 mg/dL — ABNORMAL HIGH (ref 6–23)
CO2: 32 meq/L (ref 19–32)
Calcium: 9.5 mg/dL (ref 8.4–10.5)
Chloride: 103 meq/L (ref 96–112)
Creatinine, Ser: 1.42 mg/dL — ABNORMAL HIGH (ref 0.40–1.20)
GFR: 35.88 mL/min — ABNORMAL LOW (ref >60.00)
Glucose, Bld: 83 mg/dL (ref 70–99)
Potassium: 4.3 meq/L (ref 3.5–5.1)
Sodium: 144 meq/L (ref 135–145)
Total Bilirubin: 0.4 mg/dL (ref 0.2–1.2)
Total Protein: 6.8 g/dL (ref 6.0–8.3)

## 2023-11-25 LAB — CBC
HCT: 37.7 % (ref 36.0–46.0)
Hemoglobin: 11.9 g/dL — ABNORMAL LOW (ref 12.0–15.0)
MCHC: 31.4 g/dL (ref 30.0–36.0)
MCV: 88.5 fL (ref 78.0–100.0)
Platelets: 328 10*3/uL (ref 150.0–400.0)
RBC: 4.26 Mil/uL (ref 3.87–5.11)
RDW: 14.7 % (ref 11.5–15.5)
WBC: 8.9 10*3/uL (ref 4.0–10.5)

## 2023-11-25 LAB — LIPID PANEL
Cholesterol: 187 mg/dL (ref 0–200)
HDL: 63.7 mg/dL (ref >39.00)
LDL Cholesterol: 93 mg/dL (ref 0–99)
NonHDL: 123.72
Total CHOL/HDL Ratio: 3
Triglycerides: 152 mg/dL — ABNORMAL HIGH (ref 0.0–149.0)
VLDL: 30.4 mg/dL (ref 0.0–40.0)

## 2023-11-25 LAB — B12 AND FOLATE PANEL
Folate: 5.2 ng/mL — ABNORMAL LOW (ref >5.9)
Vitamin B-12: 450 pg/mL (ref 211–911)

## 2023-11-25 LAB — HEMOGLOBIN A1C: Hgb A1c MFr Bld: 5.7 % (ref 4.6–6.5)

## 2023-11-25 LAB — TSH: TSH: 4.24 u[IU]/mL (ref 0.35–5.50)

## 2023-11-25 LAB — VITAMIN D 25 HYDROXY (VIT D DEFICIENCY, FRACTURES): VITD: 37.13 ng/mL (ref 30.00–100.00)

## 2023-11-25 LAB — MAGNESIUM: Magnesium: 2.1 mg/dL (ref 1.5–2.5)

## 2023-11-25 MED ORDER — DICYCLOMINE HCL 10 MG PO CAPS: 10.0000 mg | ORAL_CAPSULE | Freq: Three times a day (TID) | ORAL | 1 refills | Status: DC

## 2023-11-25 MED ORDER — LORAZEPAM 0.5 MG PO TABS: 0.5000 mg | ORAL_TABLET | Freq: Two times a day (BID) | ORAL | 1 refills | Status: DC | PRN

## 2023-11-25 MED ORDER — DULOXETINE HCL 60 MG PO CPEP: 60.0000 mg | ORAL_CAPSULE | Freq: Every day | ORAL | 1 refills | Status: DC

## 2023-11-25 MED ORDER — MIRTAZAPINE 15 MG PO TABS: 15.0000 mg | ORAL_TABLET | Freq: Every day | ORAL | 1 refills | Status: DC

## 2023-11-25 MED ORDER — TRAMADOL HCL 50 MG PO TABS: 50.0000 mg | ORAL_TABLET | Freq: Three times a day (TID) | ORAL | 1 refills | Status: DC

## 2023-11-25 MED ORDER — LOSARTAN POTASSIUM-HCTZ 50-12.5 MG PO TABS
1.0000 | ORAL_TABLET | Freq: Every day | ORAL | 1 refills | Status: DC
Start: 2023-11-25 — End: 2024-04-19

## 2023-11-25 MED ORDER — ATENOLOL 25 MG PO TABS: 12.5000 mg | ORAL_TABLET | Freq: Every day | ORAL | 3 refills | Status: DC

## 2023-11-25 NOTE — Progress Notes (Signed)
Patient ID: [name] [name], female  DOB: April 06, 1946, 78 y.o.   MRN: [id number] Patient Care Team    Relationship Specialty Notifications Start End  Natalia Leatherwood, DO PCP - General Family Medicine  06/02/15   Iva Boop, MD Consulting Physician Gastroenterology  11/24/15   Pa, [facility]    01/16/18   Rheumatology, [facility]    01/16/18   York Spaniel, MD (Inactive) Consulting Physician Neurology  10/24/18   Laurance Flatten, MD Consulting Physician Cardiology  10/31/22   Harriette Bouillon, MD Consulting Physician General Surgery  11/25/23   Doloris Hall., MD Physician Assistant Sports Medicine  11/25/23   Mat Carne, DO  Ophthalmology  11/25/23     Chief Complaint  Patient presents with   Hypertension    Chronic Conditions/illness Management Pt is fasting.     Subjective: [name] [name] is a 78 y.o.  female present for Chronic Conditions/illness Management  All past medical history, surgical history, allergies, family history, immunizations, medications and social history were updated in the electronic medical record today. All recent labs, ED visits and hospitalizations within the last year were reviewed.  Hypertension/hyperlipidemia/CKD3:  Pt reports compliance with Pravastatin 40 mg, losartan-HCTZ 50-12.5 mg daily (1/2 tab) mg and atenolol 12.5 mg.  Patient denies chest pain, shortness of breath, dizziness or lower extremity edema.  Pt does not take daily baby ASA. Pt is  prescribed statin. Labs completed today Diet: Low-sodium Exercise: Attempts RF: Hypertension, hyperlipidemia. Family history of heart disease, former smoker, obesity   Anxiety/panic attack/insomnia: Patient reports compliance with lexapro 20 mg daily, Cymbalta day, mirtazapine 15 mg nightly.  Chronic fatigue complaints She had been on zoloft and tapered off when lexapro started. Cymbalta has been at maximum dose and likely chosen secondary to her lumbar/arthritis issues.    She does take  Ativan twice daily.She feels her mental health is doing ok on current regimen.  She reports she still feels increased anxiety, definitely in the morning.  Also feels shaky on the inside. Noticed some mild tremors. Arthritis/pain/lumbar pain with radiation down right leg: Patient reports compliance with tramadol 50 mg twice a day when necessary for arthritic pain.She suffers from low back pain .  She has h/o anterior cervical decompression/discectomy in the past. She reports her arthritis pain has been very bad the last month.  Indication for chronic opioid: Moderate to severe arthritis and osteoporosis discomfort Medication and dose: Tramadol 50 mg TID  # pills per: #60 Last UDS date: UTD Pain contract signed (Y/N): Yes Date narcotic database last reviewed (include red flags): 11/25/23 She has been exercising, performing PT twice a week. She feels her balance is better.       11/25/2023    9:58 AM 10/26/2023    2:42 PM 05/31/2023   10:24 AM 12/07/2022    9:37 AM 10/19/2022    8:29 AM  Depression screen PHQ 2/9  Decreased Interest 1 1 0 2 2  Down, Depressed, Hopeless 1 1 0 2 2  PHQ - 2 Score 2 2 0 4 4  Altered sleeping 1 1 0 3 1  Tired, decreased energy 3 1 3 3 3   Change in appetite 0 0 0 0 2  Feeling bad or failure about yourself  1 0 0 0 1  Trouble concentrating 0 0 0 0 0  Moving slowly or fidgety/restless 0 0 0 0 0  Suicidal thoughts 0 0 0 0 0  PHQ-9 Score 7  4 3 10 11   Difficult doing work/chores Somewhat difficult Not difficult at all Somewhat difficult Somewhat difficult       11/25/2023    9:58 AM 05/31/2023   10:25 AM 12/07/2022    9:37 AM 10/19/2022    8:29 AM  GAD 7 : Generalized Anxiety Score  Nervous, Anxious, on Edge 1 0 0 3  Control/stop worrying 1 0 3 3  Worry too much - different things 2 0 3 2  Trouble relaxing 1 1 1 1   Restless 0 0 2 2  Easily annoyed or irritable 0 0 0 0  Afraid - awful might happen 0 1 0 0  Total GAD 7 Score 5 2 9 11   Anxiety  Difficulty Somewhat difficult Very difficult            11/25/2023    9:58 AM 11/14/2023    1:52 PM 10/26/2023    2:37 PM 10/24/2023    1:54 PM 05/31/2023   10:24 AM  Fall Risk   Falls in the past year? 1 1 1 1 1   Number falls in past yr: 1 1 1 1 1   Injury with Fall? 0 0 1 0 0  Risk for fall due to :   Impaired balance/gait  History of fall(s)  Follow up Falls evaluation completed  Falls evaluation completed;Education provided;Falls prevention discussed  Falls evaluation completed    Immunization History  Administered Date(s) Administered   Fluad Quad(high Dose 65+) 07/04/2022   Fluad Trivalent(High Dose 65+) 05/31/2023   Influenza Split 07/02/2011   Influenza Whole 07/16/2008, 07/04/2009, 07/14/2010   Influenza, High Dose Seasonal PF 07/04/2018, 06/20/2019   Influenza,inj,Quad PF,6+ Mos 06/29/2013, 07/08/2014, 06/23/2017   Influenza-Unspecified 07/02/2015, 06/22/2019, 04/30/2021   Moderna Sars-Covid-2 Vaccination 12/20/2019, 01/17/2020, 05/23/2020, 04/30/2021   Pneumococcal Conjugate-13 05/15/2014   Pneumococcal Polysaccharide-23 10/04/2006, 09/11/2015   Td 03/19/2010   Tdap 03/10/2020   Unspecified SARS-COV-2 Vaccination 07/06/2022   Zoster Recombinant(Shingrix) 07/07/2019, 09/05/2019   Zoster, Live 07/25/2015    No results found.  Past Medical History:  Diagnosis Date   Anxiety    Arthritis    oa   At moderate risk for fall 02/04/2021   Atypical lobular hyperplasia [facility]) of left breast 12/06/2019   Body mass index (BMI) 45.0-49.9, adult (HCC) 03/26/2021   Cardiac murmur 08/06/2020   Chronic cystitis    CKD (chronic kidney disease) stage 3, GFR 30-59 ml/min (HCC) 04/13/2017   Cystic thyroid nodule 08/09/2018   7x10 mm, right lobe   DDD (degenerative disc disease), cervical    Depression with anxiety 11/04/2007   Current med: xanax PRN, zoloft 100 mg qd.     Dysphagia 07/28/2018   Essential hypertension 11/04/2007   Current meds: lisinopril/HCTZ 10/12.5 mg qd;  tenormin 25 mg qd     Fibromyalgia    Gait instability 01/22/2019   GERD (gastroesophageal reflux disease)    Heart murmur    History of duodenal ulcer    2007   History of gastritis    2007   Hoarseness 08/22/2015   GI- did not feel gerd, ENT did not feel ent, neuro- felt possibly ent--> sent for 2nd opinion ent.    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 This patient is considered osteoporotic according to World Health Organization [facility]) criteria. L-1, L-4 were excluded due to degenerative changes. Per the official positions of the ISCD, it is not possible to quantitatively compare BMD or calculate an [facility] between exams done at different facilities.    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 MRI scheduled. 04/16/2016    Seasonal allergies 02/19/2016   Unspecified venous (peripheral) insufficiency    wears compression hose   Urine incontinence    Vitamin D deficiency 07/20/2008   Taking 1000 iu qd     Vitamin D deficiency, unspecified    Allergies  Allergen Reactions   Meloxicam Swelling    Caused edema and Cr increase   Morphine Nausea And Vomiting    SEVERE   Sulfa Antibiotics Swelling   Past Surgical History:  Procedure  Laterality Date   ANTERIOR AND POSTERIOR VAGINAL REPAIR  04/29/2004   AND TRANSVAGINAL TAPE SLING   ANTERIOR CERVICAL DECOMP/DISCECTOMY FUSION  1999   APPENDECTOMY  1984   BILATERAL SALPINGOOPHORECTOMY  1984   Bladder stimulator     BREAST BIOPSY Right 03/2021   BREAST LUMPECTOMY WITH RADIOACTIVE SEED LOCALIZATION Left 10/31/2019   Procedure: LEFT BREAST LUMPECTOMY WITH RADIOACTIVE SEED LOCALIZATION;  Surgeon: Harriette Bouillon, MD;  Location: [facility];  Service: General;  Laterality: Left;   CARDIAC CATHETERIZATION  10-03-2002   DR Andee Lineman   NORMAL CORONARIES ARTERIES/  EF 55%   CYSTOSCOPY N/A 08/27/2013   Procedure: CYSTOSCOPY;  Surgeon: Kathi Ludwig, MD;  Location: [facility];  Service: Urology;  Laterality: N/A;   CYSTOSCOPY WITH INJECTION N/A 12/17/2013   Procedure: Eustace Pen WITH INJECTION;  Surgeon: Kathi Ludwig, MD;  Location: [facility];  Service: Urology;  Laterality: N/A;   DILATION AND CURETTAGE OF UTERUS     EXCISION RIGHT NECK AND LEFT BREAST SEBACEOUS CYST  05/18/2002   HEMORRHOID BANDING  2014   PUBOVAGINAL SLING N/A 10/25/2016   Procedure: Leonides Grills;  Surgeon: [name], MD;  Location: [facility];  Service: Urology;  Laterality: N/A;   right litlle finger surgery  yrs ago   cyst removed x 4   TOTAL ABDOMINAL HYSTERECTOMY  1986   TUBAL LIGATION     VAGINAL PROLAPSE REPAIR N/A 08/27/2013   Procedure: ANTERIOR VAGINAL VAULT SUSPENSION, KELLY PLICATION WITH SACROSPINOUS LIGAMENT FIXATION AND XENFORM BOVINE DERMIS GRAFT AUGMENTATION, URETHRAL EXPLORATION, URETHROLYSIS, EXPLANTATION OF TVT TAPE, IMPLANTATION OF FLOSEAL, IMPLANTATION OF XENFORM BOVINE GRAFT IN PERIURETHRAL SPACE;  Surgeon: Kathi Ludwig, MD;  Location[facility];  Service: Urology;  Laterality: N/A   VAGINAL PROLAPSE REPAIR N/A 10/25/2016   Procedure: VAGINAL VAULT SUSPENSION with mesh and sacrospinous  repair;  Surgeon: [name], MD;  Location: [facility];  Service: Urology;  Laterality: N/A;   Family History  Problem Relation Age of Onset   Hypertension Mother    Heart disease Mother    Myelodysplastic syndrome Mother    Arthritis Mother    Cervical  cancer Mother    Lung cancer Father    Coronary artery disease Father    Fibromyalgia Sister    Hypertension Sister    Anemia Sister    Depression Sister    Pancreatic cancer Sister        died 04-Feb-20233 mos after dx   Kidney disease Sister        kidney stones, removed kidney   Hypertension Brother    Anemia Brother    Hypertension Brother    Hyperlipidemia Brother    Dementia Maternal Aunt    Dementia Maternal Aunt    Heart disease Maternal Uncle    Lung cancer Maternal Uncle    Lung cancer Paternal Uncle    Other Paternal Uncle        brain Tumor   Pancreatic cancer Cousin        maternal   Colon polyps Other        aunt   Colon cancer Neg Hx    Social History   Social History Narrative   She is retired and divorced and has 1 child   Occasional alcohol, former smoker no drug use   Right handed    Caffeine 2-3 cups per day   Lives at alone     Allergies as of 11/25/2023       Reactions   Meloxicam Swelling   Caused edema and Cr increase   Morphine Nausea And Vomiting   SEVERE   Sulfa Antibiotics Swelling        Medication List        Accurate as of November 25, 2023  4:21 PM. If you have any questions, ask your nurse or doctor.          atenolol 25 MG tablet Commonly known as: TENORMIN Take 0.5 tablets (12.5 mg total) by mouth daily.   Cequa 0.09 % Soln Generic drug: cycloSPORINE (PF) Apply 1 drop to eye 2 (two) times daily.   cetirizine 10 MG tablet Commonly known as: ZYRTEC Take 10 mg by mouth daily as needed for allergies.   cholecalciferol 25 MCG (1000 UNIT) tablet Commonly known as: VITAMIN D3 Take 1,000 Units by mouth daily.   dicyclomine 10 MG capsule Commonly known as:  BENTYL Take 1 capsule (10 mg total) by mouth 4 (four) times daily -  before meals and at bedtime.   DULoxetine 60 MG capsule Commonly known as: CYMBALTA Take 1 capsule (60 mg total) by mouth daily.   escitalopram 20 MG tablet Commonly known as: LEXAPRO Take 1 tablet (20 mg total) by mouth daily.   fesoterodine 8 MG Tb24 tablet Commonly known as: TOVIAZ   FISH OIL PO Take 1 capsule by mouth daily.   fluticasone 50 MCG/ACT nasal spray Commonly known as: FLONASE Place 1 spray into both nostrils as needed for allergies.   iron polysaccharides 150 MG capsule Commonly known as: NIFEREX TAKE 1 CAPSULE ON MONDAY, WEDNESDAY AND FRIDAY.   latanoprost 0.005 % ophthalmic solution Commonly known as: XALATAN Place 1 drop into the left eye at bedtime.   LORazepam 0.5 MG tablet Commonly known as: ATIVAN Take 1 tablet (0.5 mg total) by mouth every 12 (twelve) hours as needed for anxiety (for panic attack).   losartan-hydrochlorothiazide 50-12.5 MG tablet Commonly known as: HYZAAR Take 1 tablet by mouth daily. What changed: how much to take Changed by: Felix Pacini   mirtazapine 15 MG tablet Commonly known as: REMERON Take 1 tablet (15 mg total) by mouth at  bedtime.   omeprazole 40 MG capsule Commonly known as: PRILOSEC Take 1 capsule (40 mg total) by mouth 2 (two) times daily before a meal. 30 mins before breakfast and supper   pravastatin 40 MG tablet Commonly known as: PRAVACHOL Take 1 tablet (40 mg total) by mouth daily.   tamoxifen 20 MG tablet Commonly known as: NOLVADEX Take 20 mg by mouth daily.   traMADol 50 MG tablet Commonly known as: ULTRAM Take 1 tablet (50 mg total) by mouth every 8 (eight) hours.   Turmeric Curcumin 500 MG Caps Take 1,000 mg by mouth daily.   Vitamin B-12 1000 MCG Subl Place 1 tablet under the tongue daily.   WOMENS MULTIVITAMIN PLUS PO Take 1 tablet by mouth daily.        All past medical history, surgical history, allergies,  family history, immunizations andmedications were updated in the EMR today and reviewed under the history and medication portions of their EMR.      ROS: 14 pt review of systems performed and negative (unless mentioned in an HPI)  Objective: BP 136/84   Pulse 82   Temp 98.3 F (36.8 C)   Wt 200 lb 6.4 oz (90.9 kg)   SpO2 96%   BMI 33.35 kg/m  Physical Exam Vitals and nursing note reviewed.  Constitutional:      General: She is not in acute distress.    Appearance: Normal appearance. She is not ill-appearing, toxic-appearing or diaphoretic.  HENT:     Head: Normocephalic and atraumatic.  Eyes:     General: No scleral icterus.       Right eye: No discharge.        Left eye: No discharge.     Extraocular Movements: Extraocular movements intact.     Conjunctiva/sclera: Conjunctivae normal.     Pupils: Pupils are equal, round, and reactive to light.  Cardiovascular:     Rate and Rhythm: Normal rate and regular rhythm.     Heart sounds: No murmur heard. Pulmonary:     Effort: Pulmonary effort is normal. No respiratory distress.     Breath sounds: Normal breath sounds. No wheezing, rhonchi or rales.  Musculoskeletal:     Cervical back: Neck supple.     Right lower leg: No edema.     Left lower leg: No edema.  Lymphadenopathy:     Cervical: No cervical adenopathy.  Skin:    General: Skin is warm.     Findings: No rash.  Neurological:     Mental Status: She is alert and oriented to person, place, and time. Mental status is at baseline.     Motor: No weakness.     Gait: Gait normal.  Psychiatric:        Mood and Affect: Mood normal.        Behavior: Behavior normal.        Thought Content: Thought content normal.        Judgment: Judgment normal.    Assessment/plan: MARGART ZEMANEK is a 78 y.o. female present for [facility] Depression with anxiety/insomnia/panic d/o/benzo use Stable Discontinue Lexapro 20 mg daily.  Once we receive lab work back, we will replace with a  different medication.  Questioning if this is causing her to feel tremulous. Continue Cymbalta 60 mg qd Continue Remeron 15 mg nightly in its place. Continue Ativan provided for 6 months. NCCS database reviewed and appropriate 11/25/23 - UDS -collected today - Controlled substance contract signed. - pt declined psychology referral again today.  Hypertension/morbid obesity/hyperlipidemia//palpitations: Not at goal Increase losartan-HCTZ> increase to whole tab Continue atenolol to 12.5 mg daily. If palpitations worsen, can return to atenolol 25 mg daily.   Continue statin - diet and exercise modifications recommended.  Patient instructed to always take her blood pressure medications daily whether fasting or not. CBC, TSH and lipids collected today  CKD3: Renal dose medications.  Avoid nsaids  hydrate-patient needs to hydrate more, she does not drink much fluid daily. CMP collected today  Vitamin D deficiency/osteoporosis:  Continue vit d supplement reclast - last tx d/t to 5 yr >01/2021 Vitamin D collected today  Arthritis/pain/lumbar pain: Stable Continue tramadol to TID, for worsening chronic arthritic pain.  - UDS collected today - contract up-to-date. - NCCS database reviewed 11/25/23 and appropriate.    Atypical lobular hyperplasia (ALH) of left breast Following with surgical team  Pernicious anemia Continue B12- sublingual for best absorption.   Other irritable bowel syndrome Stable Continue Bentyl as needed   Return in about 24 weeks (around 05/11/2024) for Routine chronic condition follow-up.  Orders Placed This Encounter  Procedures   CBC   Comp Met (CMET)   TSH   Lipid panel   Hemoglobin A1c   Vitamin D (25 hydroxy)   Magnesium   B12 and Folate Panel   Drug Monitoring Panel [phone] , Urine    Meds ordered this encounter  Medications   dicyclomine (BENTYL) 10 MG capsule    Sig: Take 1 capsule (10 mg total) by mouth 4 (four) times daily -  before  meals and at bedtime.    Dispense:  120 capsule    Refill:  1   DULoxetine (CYMBALTA) 60 MG capsule    Sig: Take 1 capsule (60 mg total) by mouth daily.    Dispense:  90 capsule    Refill:  1   losartan-hydrochlorothiazide (HYZAAR) 50-12.5 MG tablet    Sig: Take 1 tablet by mouth daily.    Dispense:  90 tablet    Refill:  1   mirtazapine (REMERON) 15 MG tablet    Sig: Take 1 tablet (15 mg total) by mouth at bedtime.    Dispense:  90 tablet    Refill:  1    Please discontinue trazodone script.   traMADol (ULTRAM) 50 MG tablet    Sig: Take 1 tablet (50 mg total) by mouth every 8 (eight) hours.    Dispense:  270 tablet    Refill:  1   atenolol (TENORMIN) 25 MG tablet    Sig: Take 0.5 tablets (12.5 mg total) by mouth daily.    Dispense:  45 tablet    Refill:  3   LORazepam (ATIVAN) 0.5 MG tablet    Sig: Take 1 tablet (0.5 mg total) by mouth every 12 (twelve) hours as needed for anxiety (for panic attack).    Dispense:  180 tablet    Refill:  1    Referral Orders  No referral(s) requested today      Note is dictated utilizing voice recognition software. Although note has been proof read prior to signing, occasional typographical errors still can be missed. If any questions arise, please do not hesitate to call for verification.  Electronically signed by: Felix Pacini, DO [facility] [place]

## 2023-11-25 NOTE — Patient Instructions (Addendum)

## 2023-11-27 LAB — DRUG MONITORING PANEL 376104, URINE
Alphahydroxyalprazolam: NEGATIVE ng/mL (ref <25)
Alphahydroxymidazolam: NEGATIVE ng/mL (ref <50)
Alphahydroxytriazolam: NEGATIVE ng/mL (ref <50)
Aminoclonazepam: NEGATIVE ng/mL (ref <25)
Amphetamines: NEGATIVE ng/mL (ref <500)
Barbiturates: NEGATIVE ng/mL (ref <300)
Benzodiazepines: POSITIVE ng/mL — AB (ref <100)
Cocaine Metabolite: NEGATIVE ng/mL (ref <150)
Desmethyltramadol: 1809 ng/mL — ABNORMAL HIGH (ref <100)
Hydroxyethylflurazepam: NEGATIVE ng/mL (ref <50)
Lorazepam: 506 ng/mL — ABNORMAL HIGH (ref <50)
Nordiazepam: NEGATIVE ng/mL (ref <50)
Opiates: NEGATIVE ng/mL (ref <100)
Oxazepam: NEGATIVE ng/mL (ref <50)
Oxycodone: NEGATIVE ng/mL (ref <100)
Temazepam: NEGATIVE ng/mL (ref <50)
Tramadol: >10000 ng/mL — ABNORMAL HIGH (ref <100)

## 2023-11-27 LAB — DM TEMPLATE

## 2023-11-29 ENCOUNTER — Telehealth: Payer: Self-pay | Admitting: Family Medicine

## 2023-11-29 DIAGNOSIS — E538 Deficiency of other specified B group vitamins: Secondary | ICD-10-CM

## 2023-11-29 HISTORY — DX: Deficiency of other specified B group vitamins: E53.8

## 2023-11-29 MED ORDER — FOLIC ACID 1 MG PO TABS: 1.0000 mg | ORAL_TABLET | Freq: Every day | ORAL | 3 refills | Status: AC

## 2023-11-29 NOTE — Telephone Encounter (Signed)
 Please call patient Kidney function has mildly decreased.  I would encourage her to increase her hydration.  When kidney function decreases, it can affect the levels of some of her medications, and this can be causing her to feel shaky/tremulous, such as she described during her appointment.   She has a mildly low folate level, I have called in a folate supplement for her to start daily.  If this is not covered by her insurance she can purchase this over-the-counter.   Liver function, A1c, thyroid, electrolytes, cholesterol levels, vitamin D, B12, blood cell counts are all normal.   Discontinue the Lexapro, as we discussed during her appointment, also called escitalopram.   Continue the Cymbalta for now.   I recommend we wait on seeing how she does with Cymbalta alone before we consider starting anything else for her. Follow-up in 8 weeks, we will repeat her kidney function at this time and discuss if we need to consider starting any other medication for anxiety.

## 2023-11-29 NOTE — Telephone Encounter (Signed)
 Pt given results/recommendations. Pt scheduled for 4/23.

## 2023-12-05 ENCOUNTER — Ambulatory Visit: Payer: Medicare HMO | Admitting: Internal Medicine

## 2023-12-06 ENCOUNTER — Ambulatory Visit: Payer: Medicare HMO

## 2023-12-06 ENCOUNTER — Other Ambulatory Visit: Payer: Self-pay | Admitting: Family Medicine

## 2023-12-06 DIAGNOSIS — Z1231 Encounter for screening mammogram for malignant neoplasm of breast: Secondary | ICD-10-CM

## 2023-12-12 ENCOUNTER — Other Ambulatory Visit (HOSPITAL_COMMUNITY): Payer: Self-pay

## 2023-12-16 ENCOUNTER — Other Ambulatory Visit: Payer: Self-pay | Admitting: Family Medicine

## 2023-12-20 ENCOUNTER — Other Ambulatory Visit: Payer: Self-pay | Admitting: Family Medicine

## 2023-12-28 ENCOUNTER — Ambulatory Visit

## 2024-01-02 ENCOUNTER — Ambulatory Visit
Admission: RE | Admit: 2024-01-02 | Discharge: 2024-01-02 | Disposition: A | Discharge status: Home / Self Care | Source: Ambulatory Visit | Attending: Family Medicine | Admitting: Family Medicine

## 2024-01-02 DIAGNOSIS — Z1231 Encounter for screening mammogram for malignant neoplasm of breast: Secondary | ICD-10-CM | POA: Diagnosis not present

## 2024-01-03 ENCOUNTER — Encounter: Payer: Self-pay | Admitting: Family Medicine

## 2024-01-25 ENCOUNTER — Encounter: Payer: Self-pay | Admitting: Family Medicine

## 2024-01-25 ENCOUNTER — Ambulatory Visit: Payer: Medicare HMO | Admitting: Family Medicine

## 2024-01-25 ENCOUNTER — Ambulatory Visit (INDEPENDENT_AMBULATORY_CARE_PROVIDER_SITE_OTHER): Payer: Medicare HMO | Admitting: Family Medicine

## 2024-01-25 VITALS — BP 138/84 | HR 95 | Temp 98.3°F | Wt 206.4 lb

## 2024-01-25 DIAGNOSIS — R251 Tremor, unspecified: Secondary | ICD-10-CM

## 2024-01-25 DIAGNOSIS — E538 Deficiency of other specified B group vitamins: Secondary | ICD-10-CM

## 2024-01-25 DIAGNOSIS — N1832 Chronic kidney disease, stage 3b: Secondary | ICD-10-CM | POA: Diagnosis not present

## 2024-01-25 DIAGNOSIS — J302 Other seasonal allergic rhinitis: Secondary | ICD-10-CM | POA: Diagnosis not present

## 2024-01-25 DIAGNOSIS — F410 Panic disorder [episodic paroxysmal anxiety] without agoraphobia: Secondary | ICD-10-CM

## 2024-01-25 DIAGNOSIS — F419 Anxiety disorder, unspecified: Secondary | ICD-10-CM | POA: Diagnosis not present

## 2024-01-25 DIAGNOSIS — E559 Vitamin D deficiency, unspecified: Secondary | ICD-10-CM

## 2024-01-25 LAB — BASIC METABOLIC PANEL WITH GFR
BUN: 16 mg/dL (ref 6–23)
CO2: 30 meq/L (ref 19–32)
Calcium: 9 mg/dL (ref 8.4–10.5)
Chloride: 106 meq/L (ref 96–112)
Creatinine, Ser: 1.09 mg/dL (ref 0.40–1.20)
GFR: 49.22 mL/min — ABNORMAL LOW (ref >60.00)
Glucose, Bld: 93 mg/dL (ref 70–99)
Potassium: 4.2 meq/L (ref 3.5–5.1)
Sodium: 144 meq/L (ref 135–145)

## 2024-01-25 LAB — FOLATE: Folate: >25.2 ng/mL (ref >5.9)

## 2024-01-25 NOTE — Progress Notes (Signed)
 Patient ID: [name] [name], female  DOB: 1946-03-14, 78 y.o.   MRN: [id number] Patient Care Team    Relationship Specialty Notifications Start End  Mariel Shope, DO PCP - General Family Medicine  06/02/15   Kenney Peacemaker, MD Consulting Physician Gastroenterology  11/24/15   Pa, [facility]    01/16/18   Rheumatology, [facility]    01/16/18   Brian Campanile, MD (Inactive) Consulting Physician Neurology  10/24/18   Riccardo Chamberlain, MD Consulting Physician Cardiology  10/31/22   Sim Dryer, MD Consulting Physician General Surgery  11/25/23   Georgeanne King., MD Physician Assistant Sports Medicine  11/25/23   Altamease Asters, DO  Ophthalmology  11/25/23     Chief Complaint  Patient presents with   Anxiety    Subjective: [name] [name] is a 78 y.o.  female present for Chronic Conditions/illness Management All past medical history, surgical history, allergies, family history, immunizations, medications and social history were updated in the electronic medical record today. All recent labs, ED visits and hospitalizations within the last year were reviewed.   Folate deficiency: pt has started folate supplement and is feeling better  Anxiety/panic attack/insomnia: patient reports [name] [name] is feeling better since stopping the lexapro . [name] [name] reports [name] [name] is not feeling shaky like [name] [name] had complained of last visit. Her anxiety is still well controlled without lexapro .  [name] [name] is compliant with cymbalta  60 mg qs and remeron  15 mg at bedtime.   Prior note:  Patient reports compliance with lexapro  20 mg daily, Cymbalta  day, mirtazapine  15 mg nightly.  Chronic fatigue complaints [name] [name] had been on zoloft  and tapered off when lexapro  started. Cymbalta  has been at maximum dose and likely chosen secondary to her lumbar/arthritis issues.   [name] [name] does take  Ativan  twice daily.[name] [name] feels her mental health is doing ok on current regimen.  [name] [name] reports [name] [name] still feels increased anxiety,  definitely in the morning.  Also feels shaky on the inside. Noticed some mild tremors.  Sinus pain: Runny nose, congestion , sinus pressure. Taking zyrtec and nasal spray.        01/25/2024    9:28 AM 11/25/2023    9:58 AM 10/26/2023    2:42 PM 05/31/2023   10:24 AM 12/07/2022    9:37 AM  Depression screen PHQ 2/9  Decreased Interest 0 1 1 0 2  Down, Depressed, Hopeless 0 1 1 0 2  PHQ - 2 Score 0 2 2 0 4  Altered sleeping 2 1 1  0 3  Tired, decreased energy 0 3 1 3 3   Change in appetite 0 0 0 0 0  Feeling bad or failure about yourself  0 1 0 0 0  Trouble concentrating 0 0 0 0 0  Moving slowly or fidgety/restless 0 0 0 0 0  Suicidal thoughts 0 0 0 0 0  PHQ-9 Score 2 7 4 3 10   Difficult doing work/chores Not difficult at all Somewhat difficult Not difficult at all Somewhat difficult Somewhat difficult      01/25/2024    9:29 AM 11/25/2023    9:58 AM 05/31/2023   10:25 AM 12/07/2022    9:37 AM  GAD 7 : Generalized Anxiety Score  Nervous, Anxious, on Edge 0 1 0 0  Control/stop worrying 0 1 0 3  Worry too much - different things 0 2 0 3  Trouble relaxing 0 1 1 1   Restless 0 0 0 2  Easily annoyed or irritable 0 0  0 0  Afraid - awful might happen 0 0 1 0  Total GAD 7 Score 0 5 2 9   Anxiety Difficulty Not difficult at all Somewhat difficult Very difficult           01/25/2024    9:28 AM 11/25/2023    9:58 AM 11/14/2023    1:52 PM 10/26/2023    2:37 PM 10/24/2023    1:54 PM  Fall Risk   Falls in the past year? 0 1 1 1 1   Number falls in past yr:  1 1 1 1   Injury with Fall?  0 0 1 0  Risk for fall due to :    Impaired balance/gait   Follow up Falls evaluation completed Falls evaluation completed  Falls evaluation completed;Education provided;Falls prevention discussed     Immunization History  Administered Date(s) Administered   Fluad Quad(high Dose 65+) 07/04/2022   Fluad Trivalent(High Dose 65+) 05/31/2023   Influenza Split 07/02/2011   Influenza Whole 07/16/2008,  07/04/2009, 07/14/2010   Influenza, High Dose Seasonal PF 07/04/2018, 06/20/2019   Influenza,inj,Quad PF,6+ Mos 06/29/2013, 07/08/2014, 06/23/2017   Influenza-Unspecified 07/02/2015, 06/22/2019, 04/30/2021   Moderna Sars-Covid-2 Vaccination 12/20/2019, 01/17/2020, 05/23/2020, 04/30/2021   Pneumococcal Conjugate-13 05/15/2014   Pneumococcal Polysaccharide-23 10/04/2006, 09/11/2015   Td 03/19/2010   Tdap 03/10/2020   Unspecified SARS-COV-2 Vaccination 07/06/2022   Zoster Recombinant(Shingrix) 07/07/2019, 09/05/2019   Zoster, Live 07/25/2015    No results found.  Past Medical History:  Diagnosis Date   Anxiety    Arthritis    oa   At moderate risk for fall 02/04/2021   Atypical lobular hyperplasia [facility]) of left breast 12/06/2019   Body mass index (BMI) 45.0-49.9, adult (HCC) 03/26/2021   Cardiac murmur 08/06/2020   Chronic cystitis    CKD (chronic kidney disease) stage 3, GFR 30-59 ml/min (HCC) 04/13/2017   Cystic thyroid  nodule 08/09/2018   7x10 mm, right lobe   DDD (degenerative disc disease), cervical    Depression with anxiety 11/04/2007   Current med: xanax  PRN, zoloft  100 mg qd.     Dysphagia 07/28/2018   Essential hypertension 11/04/2007   Current meds: lisinopril /HCTZ 10/12.5 mg qd; tenormin  25 mg qd     Fibromyalgia    Gait instability 01/22/2019   GERD (gastroesophageal reflux disease)    Heart murmur    History of duodenal ulcer    2007   History of gastritis    2007   Hoarseness 08/22/2015   GI- did not feel gerd, ENT did not feel ent, neuro- felt possibly ent--> sent for 2nd opinion ent.    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 This patient is considered osteoporotic according to World Health Organization [facility]) criteria. L-1, L-4 were excluded due to degenerative changes. Per the official positions of the ISCD, it is not possible to quantitatively compare BMD or calculate an [facility] between exams done at different facilities.    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 MRI scheduled. 04/16/2016    Seasonal allergies 02/19/2016   Unspecified venous (peripheral) insufficiency    wears compression hose   Urine incontinence    Vitamin D  deficiency 07/20/2008   Taking 1000 iu qd     Vitamin D  deficiency, unspecified    Allergies  Allergen Reactions   Meloxicam  Swelling    Caused edema and Cr increase   Morphine Nausea And Vomiting    SEVERE   Sulfa Antibiotics Swelling   Past Surgical History:  Procedure Laterality Date   ANTERIOR AND POSTERIOR VAGINAL REPAIR  04/29/2004   AND TRANSVAGINAL TAPE SLING   ANTERIOR CERVICAL DECOMP/DISCECTOMY FUSION  1999   APPENDECTOMY  1984   BILATERAL SALPINGOOPHORECTOMY  1984   Bladder stimulator     BREAST BIOPSY Right 03/2021   BREAST EXCISIONAL BIOPSY Left 10/31/2019   BREAST LUMPECTOMY WITH RADIOACTIVE SEED LOCALIZATION Left 10/31/2019   Procedure: LEFT BREAST LUMPECTOMY WITH RADIOACTIVE SEED LOCALIZATION;  Surgeon: Sim Dryer, MD;  Location: [facility];  Service: General;  Laterality: Left;   CARDIAC CATHETERIZATION  10-03-2002   DR Valaria Garland   NORMAL CORONARIES ARTERIES/  EF 55%   CYSTOSCOPY N/A 08/27/2013   Procedure: CYSTOSCOPY;   Surgeon: Edmund Gouge, MD;  Location: [facility];  Service: Urology;  Laterality: N/A;   CYSTOSCOPY WITH INJECTION N/A 12/17/2013   Procedure: Rosita Cools WITH INJECTION;  Surgeon: Edmund Gouge, MD;  Location: [facility];  Service: Urology;  Laterality: N/A;   DILATION AND CURETTAGE OF UTERUS     EXCISION RIGHT NECK AND LEFT BREAST SEBACEOUS CYST  05/18/2002   HEMORRHOID BANDING  2014   PUBOVAGINAL SLING N/A 10/25/2016   Procedure: Gino Lais;  Surgeon: Annamarie Kid, MD;  Location: [facility];  Service: Urology;  Laterality: N/A;   right litlle finger surgery  yrs ago   cyst removed x 4   TOTAL ABDOMINAL HYSTERECTOMY  1986   TUBAL LIGATION     VAGINAL PROLAPSE REPAIR N/A 08/27/2013   Procedure: ANTERIOR VAGINAL VAULT SUSPENSION, KELLY PLICATION WITH SACROSPINOUS LIGAMENT FIXATION AND XENFORM BOVINE DERMIS GRAFT AUGMENTATION, URETHRAL EXPLORATION, URETHROLYSIS, EXPLANTATION OF TVT TAPE, IMPLANTATION OF FLOSEAL, IMPLANTATION OF XENFORM BOVINE GRAFT IN PERIURETHRAL SPACE;  Surgeon: Edmund Gouge, MD;  Location: [facility];  Service: Urology;  Laterality: N/A   VAGINAL PROLAPSE REPAIR N/A 10/25/2016   Procedure: VAGINAL VAULT SUSPENSION with mesh and sacrospinous repair;  Surgeon: Annamarie Kid, MD;  Location: [facility];  Service: Urology;  Laterality: N/A;   Family History  Problem Relation Age of Onset   Hypertension Mother    Heart disease Mother    Myelodysplastic syndrome Mother    Arthritis Mother    Cervical cancer Mother    Lung cancer Father    Coronary artery disease Father    Fibromyalgia Sister    Hypertension Sister    Anemia Sister    Depression Sister    Pancreatic cancer Sister        died January 31, 20233 mos after dx   Kidney disease Sister        kidney stones, removed kidney   Dementia Maternal Aunt    Dementia Maternal Aunt    Heart disease Maternal Uncle    Lung cancer Maternal  Uncle    Lung cancer Paternal Uncle    Other Paternal Uncle        brain Tumor   Pancreatic cancer Cousin        maternal   Hypertension Brother    Anemia Brother    Hypertension Brother    Hyperlipidemia Brother    Colon polyps Other        aunt   Colon cancer Neg Hx    Breast cancer Neg Hx    BRCA 1/2 Neg Hx    Social History   Social History Narrative   [name] [name] is retired and divorced and has 1 child   Occasional alcohol, former smoker no drug use   Right handed    Caffeine 2-3 cups per day   Lives at alone     Allergies as of 01/25/2024       Reactions   Meloxicam  Swelling   Caused edema and Cr increase   Morphine Nausea And Vomiting   SEVERE   Sulfa Antibiotics Swelling        Medication List        Accurate as of January 25, 2024  9:46 AM. If you have any questions, ask your nurse or doctor.          atenolol  25 MG tablet Commonly known as: TENORMIN  Take 0.5 tablets (12.5 mg total) by mouth daily.   Cequa 0.09 % Soln Generic drug: cycloSPORINE (PF) Apply 1 drop to eye 2 (two) times daily.   cetirizine 10 MG tablet Commonly known as: ZYRTEC Take 10 mg by mouth daily as needed for allergies.   cholecalciferol  25 MCG (1000 UNIT) tablet Commonly known as: VITAMIN D3 Take 1,000 Units by mouth daily.   dicyclomine  10 MG capsule Commonly known as: BENTYL  TAKE 1 CAPSULE FOUR TIMES DAILY - BEFORE MEALS AND AT BEDTIME.   DULoxetine  60 MG capsule Commonly known as: CYMBALTA  Take 1 capsule (60 mg total) by mouth daily.   fesoterodine 8 MG Tb24 tablet Commonly known as: TOVIAZ   FISH OIL PO Take 1 capsule by mouth daily.   fluticasone 50 MCG/ACT nasal spray Commonly known as: FLONASE Place 1 spray into both nostrils as needed for allergies.   folic acid  1 MG tablet Commonly known as: FOLVITE  Take 1 tablet (1 mg total) by mouth daily.   iron  polysaccharides 150 MG capsule Commonly known as: NIFEREX TAKE 1 CAPSULE ON MONDAY, WEDNESDAY AND  FRIDAY.   latanoprost 0.005 % ophthalmic solution Commonly known as: XALATAN Place 1 drop into the left eye at bedtime.   LORazepam  0.5 MG tablet Commonly known as: ATIVAN  Take 1 tablet (0.5 mg total) by mouth every 12 (twelve) hours as needed for anxiety (for panic attack).   losartan -hydrochlorothiazide  50-12.5 MG tablet Commonly known as: HYZAAR Take 1 tablet by mouth daily.   mirtazapine  15 MG tablet Commonly known as: REMERON  TAKE 1 TABLET AT BEDTIME   omeprazole  40 MG capsule Commonly known as: PRILOSEC Take 1 capsule (40 mg total) by mouth 2 (two) times daily before a meal. 30 mins before breakfast and supper   pravastatin  40 MG tablet Commonly known as: PRAVACHOL  Take 1 tablet (40 mg total) by mouth daily.   tamoxifen 20 MG tablet Commonly known as: NOLVADEX Take 20 mg by mouth daily.   traMADol  50 MG tablet Commonly known as: ULTRAM  Take 1 tablet (50 mg total) by mouth every 8 (eight) hours.   Turmeric Curcumin 500 MG Caps Take 1,000 mg by mouth daily.  Vitamin B-12 1000 MCG Subl Place 1 tablet under the tongue daily.   WOMENS MULTIVITAMIN PLUS PO Take 1 tablet by mouth daily.        All past medical history, surgical history, allergies, family history, immunizations andmedications were updated in the EMR today and reviewed under the history and medication portions of their EMR.      ROS: 14 pt review of systems performed and negative (unless mentioned in an HPI)  Objective: BP 138/84   Pulse 95   Temp 98.3 F (36.8 C)   Wt 206 lb 6.4 oz (93.6 kg)   SpO2 96%   BMI 34.35 kg/m  Physical Exam Vitals and nursing note reviewed.  Constitutional:      General: [name] [name] is not in acute distress.    Appearance: Normal appearance. [name] [name] is normal weight. [name] [name] is not ill-appearing or toxic-appearing.  HENT:     Head: Normocephalic and atraumatic.     Right Ear: Tympanic membrane and ear canal normal.     Left Ear: Tympanic membrane and ear canal normal.      Nose: Rhinorrhea present. No congestion.     Mouth/Throat:     Mouth: Mucous membranes are moist.     Pharynx: No oropharyngeal exudate or posterior oropharyngeal erythema.  Eyes:     General: No scleral icterus.       Right eye: No discharge.        Left eye: No discharge.     Extraocular Movements: Extraocular movements intact.     Conjunctiva/sclera: Conjunctivae normal.     Pupils: Pupils are equal, round, and reactive to light.  Cardiovascular:     Rate and Rhythm: Normal rate and regular rhythm.  Musculoskeletal:     Cervical back: Neck supple.  Lymphadenopathy:     Cervical: No cervical adenopathy.  Skin:    Findings: No rash.  Neurological:     Mental Status: [name] [name] is alert and oriented to person, place, and time. Mental status is at baseline.     Motor: No weakness.     Coordination: Coordination normal.     Gait: Gait normal.  Psychiatric:        Mood and Affect: Mood normal.        Behavior: Behavior normal.        Thought Content: Thought content normal.        Judgment: Judgment normal.    Assessment/plan: DAYRIN STALLONE is a 78 y.o. female present for [facility] Depression with anxiety/insomnia/panic d/o/benzo use Feeling better since stopping lexapro , less shakiness/tremulousness.  Continue Cymbalta  60 mg qd Continue Remeron  15 mg nightly in its place. Continue Ativan  provided for 6 months. NCCS database reviewed and appropriate 01/25/24 - UDS - UTD 2024 - Controlled substance contract signed. - pt declined psychology referral on multiple occasions. [name] [name] understands if [name] [name] changes her mind at any time we will place a referral for her  CKD3: Renal dose medications.  Avoid nsaids  hydrate-patient needs to hydrate more, [name] [name] does not drink much fluid daily. BMP and PTH collected GFR dropped to 35, consider nephrology referral.   Folate deficiency Pt is taking supplement and feeling better.   Sinusitis: [name] [name] does not appear infectious today by exam.  Tx with  allergy regimen.    No follow-ups on file.  Orders Placed This Encounter  Procedures   Basic Metabolic Panel (BMET)   Folate   PTH, Intact and Calcium    No orders of the defined types were placed in  this encounter.   Referral Orders  No referral(s) requested today      Note is dictated utilizing voice recognition software. Although note has been proof read prior to signing, occasional typographical errors still can be missed. If any questions arise, please do not hesitate to call for verification.  Electronically signed by: Napolean Backbone, DO [facility] [place]

## 2024-01-26 ENCOUNTER — Encounter: Payer: Self-pay | Admitting: Family Medicine

## 2024-01-26 LAB — PTH, INTACT AND CALCIUM
Calcium: 9 mg/dL (ref 8.6–10.4)
PTH: 100 pg/mL — ABNORMAL HIGH (ref 16–77)

## 2024-02-28 ENCOUNTER — Other Ambulatory Visit: Payer: Self-pay | Admitting: Family Medicine

## 2024-03-22 ENCOUNTER — Telehealth: Payer: Self-pay

## 2024-03-22 NOTE — Telephone Encounter (Signed)
 Form prepped for mail and placed up front.

## 2024-03-22 NOTE — Telephone Encounter (Signed)
Completed and placed in CMA work basket 

## 2024-03-22 NOTE — Telephone Encounter (Signed)
 Patient dropped off document Handicap Placard, to be filled out by provider due to limited walking ability. Patient requested to send it back via Mail within 5-days.Please advise at  [phone] (mobile)  Placed on PCP desk to review and sign, if appropriate.

## 2024-04-04 ENCOUNTER — Other Ambulatory Visit: Payer: Self-pay | Admitting: Family Medicine

## 2024-04-19 ENCOUNTER — Other Ambulatory Visit: Payer: Self-pay | Admitting: Family Medicine

## 2024-04-25 ENCOUNTER — Ambulatory Visit (INDEPENDENT_AMBULATORY_CARE_PROVIDER_SITE_OTHER): Admitting: Family Medicine

## 2024-04-25 ENCOUNTER — Encounter: Payer: Self-pay | Admitting: Family Medicine

## 2024-04-25 VITALS — BP 122/84 | HR 82 | Temp 97.9°F | Wt 207.8 lb

## 2024-04-25 DIAGNOSIS — R61 Generalized hyperhidrosis: Secondary | ICD-10-CM | POA: Diagnosis not present

## 2024-04-25 LAB — CBC WITH DIFFERENTIAL/PLATELET
Basophils Absolute: 0 K/uL (ref 0.0–0.1)
Basophils Relative: 0.6 % (ref 0.0–3.0)
Eosinophils Absolute: 0.2 K/uL (ref 0.0–0.7)
Eosinophils Relative: 3.1 % (ref 0.0–5.0)
HCT: 36.3 % (ref 36.0–46.0)
Hemoglobin: 11.4 g/dL — ABNORMAL LOW (ref 12.0–15.0)
Lymphocytes Relative: 31.9 % (ref 12.0–46.0)
Lymphs Abs: 2.3 K/uL (ref 0.7–4.0)
MCHC: 31.4 g/dL (ref 30.0–36.0)
MCV: 88.3 fl (ref 78.0–100.0)
Monocytes Absolute: 0.6 K/uL (ref 0.1–1.0)
Monocytes Relative: 7.5 % (ref 3.0–12.0)
Neutro Abs: 4.2 K/uL (ref 1.4–7.7)
Neutrophils Relative %: 56.9 % (ref 43.0–77.0)
Platelets: 293 K/uL (ref 150.0–400.0)
RBC: 4.11 Mil/uL (ref 3.87–5.11)
RDW: 14.4 % (ref 11.5–15.5)
WBC: 7.4 K/uL (ref 4.0–10.5)

## 2024-04-25 LAB — IBC + FERRITIN
Ferritin: 13.7 ng/mL (ref 10.0–291.0)
Iron: 103 ug/dL (ref 42–145)
Saturation Ratios: 22.4 % (ref 20.0–50.0)
TIBC: 459.2 ug/dL — ABNORMAL HIGH (ref 250.0–450.0)
Transferrin: 328 mg/dL (ref 212.0–360.0)

## 2024-04-25 LAB — TSH: TSH: 2.93 u[IU]/mL (ref 0.35–5.50)

## 2024-04-25 NOTE — Progress Notes (Signed)
 [name] [name] , 03-04-1946, 78 y.o., female MRN: [id number] Patient Care Team    Relationship Specialty Notifications Start End  Catherine Charlies LABOR, DO PCP - General Family Medicine  06/02/15   Avram Lupita BRAVO, MD Consulting Physician Gastroenterology  11/24/15   Pa, [facility]    01/16/18   Rheumatology, [facility]    01/16/18   Jenel Carlin POUR, MD (Inactive) Consulting Physician Neurology  10/24/18   Powell Sorrow, MD Consulting Physician Cardiology  10/31/22   Vanderbilt Ned, MD Consulting Physician General Surgery  11/25/23   Brenna Lonni PARAS., MD Physician Assistant Sports Medicine  11/25/23   Kennyth Cy RAMAN, DO  Ophthalmology  11/25/23     Chief Complaint  Patient presents with   Excessive Sweating    Continuously for about 1 month     Subjective: [name] [name] is a 78 y.o. Pt presents for an OV with complaints of excessive sweating of 1 month duration.  She reports the sweating is mostly located over her head and runs down her neck and chest.  She reports it occurs when she gets up and even just walks across the room.  She does endorse having night sweats sometimes. Denies symptoms if sitting still. She is prescribed Cymbalta , mirtazapine  and a beta-blocker, none of these medications are new to her for dose increased. She has a history of CKD 3. She denies any chest pain, dizziness or syncope with excessive sweating.  She has not been to see her cardiologist in some time. She is taking an iron  supplement secondary to low iron . She denies fatigue    04/25/2024   10:27 AM 01/25/2024    9:28 AM 11/25/2023    9:58 AM 10/26/2023    2:42 PM 05/31/2023   10:24 AM  Depression screen PHQ 2/9  Decreased Interest 0 0 1 1 0  Down, Depressed, Hopeless 0 0 1 1 0  PHQ - 2 Score 0 0 2 2 0  Altered sleeping 0 2 1 1  0  Tired, decreased energy 3 0 3 1 3   Change in appetite 0 0 0 0 0  Feeling bad or failure about yourself  0 0 1 0 0  Trouble concentrating 0 0 0 0  0  Moving slowly or fidgety/restless 0 0 0 0 0  Suicidal thoughts 0 0 0 0 0  PHQ-9 Score 3 2 7 4 3   Difficult doing work/chores Somewhat difficult Not difficult at all Somewhat difficult Not difficult at all Somewhat difficult    Allergies  Allergen Reactions   Meloxicam  Swelling    Caused edema and Cr increase   Morphine Nausea And Vomiting    SEVERE   Sulfa Antibiotics Swelling   Social History   Social History Narrative   She is retired and divorced and has 1 child   Occasional alcohol, former smoker no drug use   Right handed    Caffeine 2-3 cups per day   Lives at alone    Past Medical History:  Diagnosis Date   Anxiety    Arthritis    oa   At moderate risk for fall 02/04/2021   Atypical lobular hyperplasia [facility] 2) of left breast 12/06/2019   Body mass index (BMI) 45.0-49.9, adult (HCC) 03/26/2021   Cardiac murmur 08/06/2020   Chronic cystitis    CKD (chronic kidney disease) stage 3, GFR 30-59 ml/min (HCC) 04/13/2017   Cystic thyroid  nodule 08/09/2018   7x10 mm, right lobe   DDD (  degenerative disc disease), cervical    Depression with anxiety 11/04/2007   Current med: xanax  PRN, zoloft  100 mg qd.     Dysphagia 07/28/2018   Essential hypertension 11/04/2007   Current meds: lisinopril /HCTZ 10/12.5 mg qd; tenormin  25 mg qd     Fibromyalgia    Gait instability 01/22/2019   GERD (gastroesophageal reflux disease)    Heart murmur    History of duodenal ulcer    2007   History of gastritis    2007   Hoarseness 08/22/2015   GI- did not feel gerd, ENT did not feel ent, neuro- felt possibly ent--> sent for 2nd opinion ent.    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 This patient is considered osteoporotic according to World Health Organization [facility]) criteria. L-1, L-4 were excluded due to degenerative changes. Per the official positions of the ISCD, it is not possible to quantitatively compare BMD or calculate an [facility] between exams done at different facilities.    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 MRI scheduled. 04/16/2016    Seasonal allergies 02/19/2016   Unspecified venous (peripheral) insufficiency    wears compression hose   Urine incontinence    Vitamin D  deficiency 07/20/2008   Taking 1000 iu qd     Vitamin D  deficiency, unspecified    Past Surgical History:  Procedure Laterality Date   ANTERIOR AND POSTERIOR VAGINAL REPAIR  04/29/2004   AND TRANSVAGINAL TAPE SLING   ANTERIOR CERVICAL DECOMP/DISCECTOMY FUSION  1999   APPENDECTOMY  1984   BILATERAL SALPINGOOPHORECTOMY  1984   Bladder stimulator     BREAST BIOPSY Right 03/2021   BREAST EXCISIONAL BIOPSY Left 10/31/2019   BREAST LUMPECTOMY WITH RADIOACTIVE SEED LOCALIZATION Left 10/31/2019   Procedure: LEFT BREAST LUMPECTOMY WITH RADIOACTIVE SEED LOCALIZATION;  Surgeon: Vanderbilt Ned, MD;  Location: [facility];  Service: General;  Laterality: Left;   CARDIAC CATHETERIZATION  10-03-2002   DR JODINE   NORMAL CORONARIES ARTERIES/  EF 55%   CYSTOSCOPY N/A 08/27/2013   Procedure: CYSTOSCOPY;  Surgeon: Arlena LILLETTE Gal, MD;  Location[facility];  Service: Urology;  Laterality: N/A;   CYSTOSCOPY WITH INJECTION  N/A 12/17/2013   Procedure: JENNETTA WITH INJECTION;  Surgeon: Arlena LILLETTE Gal, MD;  Location: [facility];  Service: Urology;  Laterality: N/A;   DILATION AND CURETTAGE OF UTERUS     EXCISION RIGHT NECK AND LEFT BREAST SEBACEOUS CYST  05/18/2002   HEMORRHOID BANDING  2014   PUBOVAGINAL SLING N/A 10/25/2016   Procedure: CARLOYN GLADE;  Surgeon: Arlena Gal, MD;  Location: [facility];  Service: Urology;  Laterality: N/A;   right litlle finger surgery  yrs ago   cyst removed x 4  TOTAL ABDOMINAL HYSTERECTOMY  1986   TUBAL LIGATION     VAGINAL PROLAPSE REPAIR N/A 08/27/2013   Procedure: ANTERIOR VAGINAL VAULT SUSPENSION, KELLY PLICATION WITH SACROSPINOUS LIGAMENT FIXATION AND XENFORM BOVINE DERMIS GRAFT AUGMENTATION, URETHRAL EXPLORATION, URETHROLYSIS, EXPLANTATION OF TVT TAPE, IMPLANTATION OF FLOSEAL, IMPLANTATION OF XENFORM BOVINE GRAFT IN PERIURETHRAL SPACE;  Surgeon: Arlena LILLETTE Gal, MD;  Location: [facility];  Service: Urology;  Laterality: N/A   VAGINAL PROLAPSE REPAIR N/A 10/25/2016   Procedure: VAGINAL VAULT SUSPENSION with mesh and sacrospinous repair;  Surgeon: Arlena Gal, MD;  Location: [facility];  Service: Urology;  Laterality: N/A;   Family History  Problem Relation Age of Onset   Hypertension Mother    Heart disease Mother    Myelodysplastic syndrome Mother    Arthritis Mother    Cervical cancer Mother    Lung cancer Father    Coronary artery disease Father    Fibromyalgia Sister    Hypertension Sister    Anemia Sister    Depression Sister    Pancreatic cancer Sister        died 02/14/233 mos after dx   Kidney disease Sister        kidney stones, removed kidney   Dementia Maternal Aunt    Dementia Maternal Aunt    Heart disease Maternal Uncle    Lung cancer Maternal Uncle    Lung cancer Paternal Uncle    Other Paternal Uncle        brain Tumor   Pancreatic cancer Cousin        maternal   Hypertension  Brother    Anemia Brother    Hypertension Brother    Hyperlipidemia Brother    Colon polyps Other        aunt   Colon cancer Neg Hx    Breast cancer Neg Hx    BRCA 1/2 Neg Hx    Allergies as of 04/25/2024       Reactions   Meloxicam  Swelling   Caused edema and Cr increase   Morphine Nausea And Vomiting   SEVERE   Sulfa Antibiotics Swelling        Medication List        Accurate as of April 25, 2024 11:15 AM. If you have any questions, ask your nurse or doctor.          atenolol  25 MG tablet Commonly known as: TENORMIN  Take 0.5 tablets (12.5 mg total) by mouth daily.   Cequa 0.09 % Soln Generic drug: cycloSPORINE (PF) Apply 1 drop to eye 2 (two) times daily.   cetirizine 10 MG tablet Commonly known as: ZYRTEC Take 10 mg by mouth daily as needed for allergies.   cholecalciferol  25 MCG (1000 UNIT) tablet Commonly known as: VITAMIN D3 Take 1,000 Units by mouth daily.   dicyclomine  10 MG capsule Commonly known as: BENTYL  TAKE 1 CAPSULE FOUR TIMES DAILY - BEFORE MEALS AND AT BEDTIME.   DULoxetine  60 MG capsule Commonly known as: CYMBALTA  Take 1 capsule (60 mg total) by mouth daily.   fesoterodine 8 MG Tb24 tablet Commonly known as: TOVIAZ   FISH OIL PO Take 1 capsule by mouth daily.   fluticasone 50 MCG/ACT nasal spray Commonly known as: FLONASE Place 1 spray into both nostrils as needed for allergies.   folic acid  1 MG tablet Commonly known as: FOLVITE  Take 1 tablet (1 mg total) by mouth daily.   iron  polysaccharides 150 MG capsule Commonly known as: NIFEREX TAKE 1  CAPSULE ON MONDAY, WEDNESDAY AND FRIDAY.   latanoprost 0.005 % ophthalmic solution Commonly known as: XALATAN Place 1 drop into the left eye at bedtime.   LORazepam  0.5 MG tablet Commonly known as: ATIVAN  Take 1 tablet (0.5 mg total) by mouth every 12 (twelve) hours as needed for anxiety (for panic attack).   losartan -hydrochlorothiazide  50-12.5 MG tablet Commonly known as:  HYZAAR TAKE 1 TABLET EVERY DAY   mirtazapine  15 MG tablet Commonly known as: REMERON  TAKE 1 TABLET AT BEDTIME   omeprazole  40 MG capsule Commonly known as: PRILOSEC Take 1 capsule (40 mg total) by mouth 2 (two) times daily before a meal. 30 mins before breakfast and supper   pravastatin  40 MG tablet Commonly known as: PRAVACHOL  Take 1 tablet (40 mg total) by mouth daily.   tamoxifen 20 MG tablet Commonly known as: NOLVADEX Take 20 mg by mouth daily.   traMADol  50 MG tablet Commonly known as: ULTRAM  Take 1 tablet (50 mg total) by mouth every 8 (eight) hours.   Turmeric Curcumin 500 MG Caps Take 1,000 mg by mouth daily.   Vitamin B-12 1000 MCG Subl Place 1 tablet under the tongue daily.   WOMENS MULTIVITAMIN PLUS PO Take 1 tablet by mouth daily.        All past medical history, surgical history, allergies, family history, immunizations andmedications were updated in the EMR today and reviewed under the history and medication portions of their EMR.     ROS Negative, with the exception of above mentioned in HPI   Objective:  BP 122/84   Pulse 82   Temp 97.9 F (36.6 C)   Wt 207 lb 12.8 oz (94.3 kg)   SpO2 98%   BMI 34.58 kg/m  Body mass index is 34.58 kg/m. Physical Exam Vitals and nursing note reviewed.  Constitutional:      General: She is not in acute distress.    Appearance: Normal appearance. She is not ill-appearing, toxic-appearing or diaphoretic.  HENT:     Head: Normocephalic and atraumatic.  Eyes:     General: No scleral icterus.       Right eye: No discharge.        Left eye: No discharge.     Extraocular Movements: Extraocular movements intact.     Conjunctiva/sclera: Conjunctivae normal.     Pupils: Pupils are equal, round, and reactive to light.  Neck:     Comments: No thyromegaly Cardiovascular:     Rate and Rhythm: Normal rate and regular rhythm.     Heart sounds: No murmur heard. Pulmonary:     Effort: Pulmonary effort is normal.  No respiratory distress.     Breath sounds: Normal breath sounds. No wheezing, rhonchi or rales.  Musculoskeletal:     Cervical back: Neck supple.     Right lower leg: No edema.     Left lower leg: No edema.  Skin:    General: Skin is warm.     Findings: No rash.  Neurological:     Mental Status: She is alert and oriented to person, place, and time. Mental status is at baseline.     Motor: No weakness.     Gait: Gait normal.  Psychiatric:        Mood and Affect: Mood normal.        Behavior: Behavior normal.        Thought Content: Thought content normal.        Judgment: Judgment normal.      No  results found. No results found. No results found for this or any previous visit (from the past 24 hours).  Assessment/Plan: [name] [name] is a 78 y.o. female present for OV for  Excessive sweating (Primary) We discussed differential diagnoses that can be responsible for excessive sweating.  Avoiding events seem to be occurring after patient had minimal physical activity, never when at rest on the couch, but does happen during sleep sometimes. None of her medications have changed, but she is on medications with the side effect profile of excessive sweating. She is on iron  supplement-therefore we will check iron  levels make sure not over supplementing. Rule out thyroid  disorder in sure blood cell counts are normal. Discussed possible referral to cardiology for further evaluation since this is occurring with minimal exertion. She is prescribed Toviaz at high dose, with her kidney function she may need to lower this dose anyway.  Could consider switching to oxybutynin 5 mg extended release and see if this helps with her excessive sweating.  Will await labs and discuss follow-up - IBC + Ferritin - TSH - CBC w/Diff  Reviewed expectations re: course of current medical issues. Discussed self-management of symptoms. Outlined signs and symptoms indicating need for more acute  intervention. Patient verbalized understanding and all questions were answered. Patient received an After-Visit Summary.    Orders Placed This Encounter  Procedures   IBC + Ferritin   TSH   CBC w/Diff   No orders of the defined types were placed in this encounter.  Referral Orders  No referral(s) requested today     Note is dictated utilizing voice recognition software. Although note has been proof read prior to signing, occasional typographical errors still can be missed. If any questions arise, please do not hesitate to call for verification.   electronically signed by:  Charlies Bellini, DO  [facility] - [place]

## 2024-04-25 NOTE — Patient Instructions (Addendum)

## 2024-04-26 ENCOUNTER — Telehealth: Payer: Self-pay | Admitting: Family Medicine

## 2024-04-26 ENCOUNTER — Ambulatory Visit: Payer: Self-pay | Admitting: Family Medicine

## 2024-04-26 NOTE — Telephone Encounter (Signed)
 Error

## 2024-04-26 NOTE — Telephone Encounter (Signed)
 Please call patient.  Patient must be called to ensure she understands medication directions. Her iron  levels are normal, her blood cell counts are stable and her thyroid  levels are normal.  We could discontinue the Toviaz, which is her bladder medicine and start oxybutynin which is the same type of bladder medicine as Toviaz, but also has shown to help with excessive sweating.    If she is willing to switch, I will call this in for her today.

## 2024-05-04 ENCOUNTER — Ambulatory Visit: Attending: Emergency Medicine

## 2024-05-04 ENCOUNTER — Ambulatory Visit: Attending: Emergency Medicine | Admitting: Emergency Medicine

## 2024-05-04 ENCOUNTER — Encounter: Payer: Self-pay | Admitting: Emergency Medicine

## 2024-05-04 VITALS — BP 128/84 | HR 91 | Ht 65.0 in | Wt 204.0 lb

## 2024-05-04 DIAGNOSIS — E782 Mixed hyperlipidemia: Secondary | ICD-10-CM

## 2024-05-04 DIAGNOSIS — I10 Essential (primary) hypertension: Secondary | ICD-10-CM | POA: Diagnosis not present

## 2024-05-04 DIAGNOSIS — R55 Syncope and collapse: Secondary | ICD-10-CM

## 2024-05-04 DIAGNOSIS — I700 Atherosclerosis of aorta: Secondary | ICD-10-CM

## 2024-05-04 DIAGNOSIS — R002 Palpitations: Secondary | ICD-10-CM

## 2024-05-04 NOTE — Progress Notes (Signed)
 Cardiology Office Note:    Date:  05/04/2024  ID:  [name] [name], DOB 09-22-1946, MRN [id number] PCP: Catherine Charlies LABOR, DO  [facility] Providers Cardiologist:  None       Patient Profile:       Chief Complaint: 1 year follow-up History of Present Illness:  [name] [name] is a 78 y.o. female with visit-pertinent history of aortic atherosclerosis, hyperlipidemia, hypertension, CKD stage III, depression  Patient established with cardiology service in 2021 for evaluation of dizziness.  Echocardiogram 08/2020 with LVEF 55 to 60%, no RWMA, mild LVH, grade 1 DD, RV function and size normal.  Lexiscan  Myoview  08/2020 close normal study without ischemia or infarction.  Last seen in clinic on 12/10/2022 by Dr. Alvan.  She reported having episodes of feeling lightheaded, shortness of breath, and presyncope that worsen with stress and anxiety.  She notably drinks significant amounts of caffeine during the day and water  infrequently.  14-day ZIO monitor was ordered however was never completed.   Discussed the use of AI scribe software for clinical note transcription with the patient, who gave verbal consent to proceed.  History of Present Illness [name] [name] is a 78 year old female with hypertension who presents with excessive sweating and feelings of near syncope.  Today she is without any chest pains.  She reports excessive sweating and near syncope occur with heat exposure, increasing in frequency over the past month.  These episodes typically occur during times of warm showers or being outside on hot days.  Episodes involve weakness and shakiness, without dizziness, and are not limited to specific activities. Blood pressure is usually controlled at 136/86 mmHg.  She recently medication changes with her PCP.  She quit smoking 15 years ago and has reduced caffeine intake to a weekly latte. Occasional palpitations feel like her heart 'turns over' and take her breath away. She is unsure about  dehydration.  She denies any chest pains, dyspnea, orthopnea, PND, leg swelling, syncope  Review of systems:  Please see the history of present illness. All other systems are reviewed and otherwise negative.      Studies Reviewed:    EKG Interpretation Date/Time:  Friday May 04 2024 08:33:26 EDT Ventricular Rate:  91 PR Interval:  174 QRS Duration:  94 QT Interval:  390 QTC Calculation: 479 R Axis:   -4  Text Interpretation: Normal sinus rhythm Inferior infarct , age undetermined Cannot rule out Anterior infarct (cited on or before 29-Apr-2004) Confirmed by Rana Dixon [phone]) on 05/04/2024 11:14:38 PM    Lexiscan  Myoview  09/01/2020 Nuclear stress EF: 64%. There was no ST segment deviation noted during stress. No T wave inversion was noted during stress. The study is normal. This is a low risk study. The left ventricular ejection fraction is normal (55-65%).   1. Mildly reduced apical counts on rest and stress with normal wall motion consistent with apical thinning artifact.  2. Normal study without ischemia or infarction. 3. Normal LVEF, 64%.  4. This is a low-risk study.   Echocardiogram 09/01/2020  1. Left ventricular ejection fraction, by estimation, is 55 to 60%. The  left ventricle has normal function. The left ventricle has no regional  wall motion abnormalities. There is mild left ventricular hypertrophy.  Left ventricular diastolic parameters  are consistent with Grade I diastolic dysfunction (impaired relaxation).   2. Right ventricular systolic function is normal. The right ventricular  size is normal. Tricuspid regurgitation signal is inadequate for assessing  PA pressure.  3. The mitral valve is normal in structure. No evidence of mitral valve  regurgitation. No evidence of mitral stenosis.   4. The aortic valve is tricuspid. Aortic valve regurgitation is not  visualized. No aortic stenosis is present.   5. The inferior vena cava is normal in size  with greater than 50%  respiratory variability, suggesting right atrial pressure of 3 mmHg.   Risk Assessment/Calculations:              Physical Exam:   VS:  BP 128/84 (BP Location: Left Arm, Patient Position: Sitting, Cuff Size: Normal)   Pulse 91   Ht 5' 5 (1.651 m)   Wt 204 lb (92.5 kg)   BMI 33.95 kg/m    Wt Readings from Last 3 Encounters:  05/04/24 204 lb (92.5 kg)  04/25/24 207 lb 12.8 oz (94.3 kg)  01/25/24 206 lb 6.4 oz (93.6 kg)    GEN: Well nourished, well developed in no acute distress NECK: No JVD; No carotid bruits CARDIAC: RRR, no murmurs, rubs, gallops RESPIRATORY:  Clear to auscultation without rales, wheezing or rhonchi  ABDOMEN: Soft, non-tender, non-distended EXTREMITIES:  No edema; No acute deformity      Assessment and Plan:  Near syncope Patient reports having episodes of near syncope with associated excessive sweating, palpitations, lightheadedness, weakness and shakiness.  It seems the these events are associated with heat exposure such as warm showers and being outside on hot days.  Her symptoms occur after minimal physical activity.  Her symptoms do resolve within several minutes.  Her symptoms have become more frequent over the past month.  She denies any episodes of syncope.  She has cut back on her caffeine use as well as managed her stress/anxiety. - She had reassuring echocardiogram and Myoview  on 08/2020 - Recent TSH, CBC, magnesium, and BMET were reassuring - Plan for ZIO to evaluate for possible arrhythmia - Check blood pressure and heart rate at home during events - Maintain adequate fluid intake to avoid dehydration  Hypertension Blood pressure today was initially elevated however improved to 128/84 - Continue atenolol  12.5 mg daily and losartan -hydrochlorothiazide  50-12.5 mg daily  Aortic atherosclerosis Hyperlipidemia, LDL goal <70 LDL 93 on 11/2023 and not well-controlled Reduction in diet was emphasized.  Encouraged moderate  intensity exercise - Plan for fasting lipid panel at follow-up visit and increase current statin if needed get to goal - Continue pravastatin  40 mg daily      Dispo:  Return in about 4 months (around 09/03/2024).  Signed, Lum LITTIE Louis, NP

## 2024-05-04 NOTE — Progress Notes (Unsigned)
 Enrolled for [employer] to mail a ZIO XT long term holter monitor to the patients address on file.   Dr. Royann Shivers to read.

## 2024-05-04 NOTE — Patient Instructions (Signed)
 Medication Instructions:  NO CHANGES   Lab Work: NONE TODAY   Testing/Procedures: ZIO XT- Long Term Monitor Instructions  Your physician has requested you wear a ZIO patch monitor for 14 days.  This is a single patch monitor. [employer] supplies one patch monitor per enrollment. Additional stickers are not available. Please do not apply patch if you will be having a Nuclear Stress Test,  Echocardiogram, Cardiac CT, MRI, or Chest Xray during the period you would be wearing the  monitor. The patch cannot be worn during these tests. You cannot remove and re-apply the  ZIO XT patch monitor.  Your ZIO patch monitor will be mailed 3 day USPS to your address on file. It may take 3-5 days  to receive your monitor after you have been enrolled.  Once you have received your monitor, please review the enclosed instructions. Your monitor  has already been registered assigning a specific monitor serial # to you.  Billing and Patient Assistance Program Information  We have supplied [employer] with any of your insurance information on file for billing purposes. [employer] offers a sliding scale Patient Assistance Program for patients that do not have  insurance, or whose insurance does not completely cover the cost of the ZIO monitor.  You must apply for the Patient Assistance Program to qualify for this discounted rate.  To apply, please call [employer] at [phone], select option 4, select option 2, ask to apply for  Patient Assistance Program. Meredeth will ask your household income, and how many people  are in your household. They will quote your out-of-pocket cost based on that information.  [employer] will also be able to set up a 69-month, interest-free payment plan if needed.  Applying the monitor   Shave hair from upper left chest.  Hold abrader disc by orange tab. Rub abrader in 40 strokes over the upper left chest as  indicated in your monitor instructions.  Clean area with 4 enclosed alcohol  pads. Let dry.  Apply patch as indicated in monitor instructions. Patch will be placed under collarbone on left  side of chest with arrow pointing upward.  Rub patch adhesive wings for 2 minutes. Remove white label marked 1. Remove the white  label marked 2. Rub patch adhesive wings for 2 additional minutes.  While looking in a mirror, press and release button in center of patch. A small green light will  flash 3-4 times. This will be your only indicator that the monitor has been turned on.  Do not shower for the first 24 hours. You may shower after the first 24 hours.  Press the button if you feel a symptom. You will hear a small click. Record Date, Time and  Symptom in the Patient Logbook.  When you are ready to remove the patch, follow instructions on the last 2 pages of Patient  Logbook. Stick patch monitor onto the last page of Patient Logbook.  Place Patient Logbook in the blue and white box. Use locking tab on box and tape box closed  securely. The blue and white box has prepaid postage on it. Please place it in the mailbox as  soon as possible. Your physician should have your test results approximately 7 days after the  monitor has been mailed back to [facility].  Call [facility] Customer Care at [phone] if you have questions regarding  your ZIO XT patch monitor. Call them immediately if you see an orange light blinking on your  monitor.  If your monitor falls off  in less than 4 days, contact our Monitor department at [phone].  If your monitor becomes loose or falls off after 4 days call [employer] at [phone] for  suggestions on securing your monitor   Follow-Up: At [facility], you and your health needs are our priority.  As part of our continuing mission to provide you with exceptional heart care, our providers are all part of one team.  This team includes your primary Cardiologist (physician) and Advanced Practice Providers or APPs (Physician  Assistants and Nurse Practitioners) who all work together to provide you with the care you need, when you need it.  Your next appointment:   4 MONTHS  Provider:   MADISON FOUNTAIN, DNP   We recommend signing up for the patient portal called MyChart.  Sign up information is provided on this After Visit Summary.  MyChart is used to connect with patients for Virtual Visits (Telemedicine).  Patients are able to view lab/test results, encounter notes, upcoming appointments, etc.  Non-urgent messages can be sent to your provider as well.   To learn more about what you can do with MyChart, go to [email or web address].   Other Instructions PLEASE CHECK AND RECORD BOTH YOUR BLOOD PRESSURE AND HEART RATE EACH DAY AND WHENEVER YOU FEEL LIKE YOU ARE ABOUT TO PASS OUT. BRING READINGS TO YOUR NEXT OFFICE VISIT.

## 2024-05-10 ENCOUNTER — Other Ambulatory Visit: Payer: Self-pay | Admitting: Family Medicine

## 2024-05-13 ENCOUNTER — Other Ambulatory Visit: Payer: Self-pay | Admitting: Family Medicine

## 2024-05-23 DIAGNOSIS — H52212 Irregular astigmatism, left eye: Secondary | ICD-10-CM | POA: Diagnosis not present

## 2024-05-23 DIAGNOSIS — H40022 Open angle with borderline findings, high risk, left eye: Secondary | ICD-10-CM | POA: Diagnosis not present

## 2024-05-23 DIAGNOSIS — H35342 Macular cyst, hole, or pseudohole, left eye: Secondary | ICD-10-CM | POA: Diagnosis not present

## 2024-05-23 DIAGNOSIS — H524 Presbyopia: Secondary | ICD-10-CM | POA: Diagnosis not present

## 2024-05-23 DIAGNOSIS — H5213 Myopia, bilateral: Secondary | ICD-10-CM | POA: Diagnosis not present

## 2024-05-23 DIAGNOSIS — H16223 Keratoconjunctivitis sicca, not specified as Sjogren's, bilateral: Secondary | ICD-10-CM | POA: Diagnosis not present

## 2024-05-23 DIAGNOSIS — H17823 Peripheral opacity of cornea, bilateral: Secondary | ICD-10-CM | POA: Diagnosis not present

## 2024-05-30 DIAGNOSIS — R55 Syncope and collapse: Secondary | ICD-10-CM | POA: Diagnosis not present

## 2024-05-30 DIAGNOSIS — R002 Palpitations: Secondary | ICD-10-CM | POA: Diagnosis not present

## 2024-05-31 ENCOUNTER — Ambulatory Visit: Payer: Self-pay | Admitting: Emergency Medicine

## 2024-05-31 DIAGNOSIS — R002 Palpitations: Secondary | ICD-10-CM

## 2024-05-31 DIAGNOSIS — R55 Syncope and collapse: Secondary | ICD-10-CM

## 2024-06-22 ENCOUNTER — Other Ambulatory Visit: Payer: Self-pay | Admitting: Family Medicine

## 2024-07-04 ENCOUNTER — Ambulatory Visit: Admitting: Family Medicine

## 2024-07-12 ENCOUNTER — Ambulatory Visit: Admitting: Family Medicine

## 2024-07-19 ENCOUNTER — Ambulatory Visit: Admitting: Family Medicine

## 2024-07-23 ENCOUNTER — Ambulatory Visit: Admitting: Family Medicine

## 2024-07-23 NOTE — Progress Notes (Deleted)
 Patient ID: [name] [name], female  DOB: [birth year], 78 y.o.   MRN: [id number] Patient Care Team    Relationship Specialty Notifications Start End  Catherine Charlies LABOR, DO PCP - General Family Medicine  06/02/15   Avram Lupita BRAVO, MD Consulting Physician Gastroenterology  11/24/15   Pa, [facility]    01/16/18   Rheumatology, [facility]    01/16/18   Jenel Carlin POUR, MD (Inactive) Consulting Physician Neurology  10/24/18   Powell Sorrow, MD Consulting Physician Cardiology  10/31/22   Vanderbilt Ned, MD Consulting Physician General Surgery  11/25/23   Brenna Lonni PARAS., MD Physician Assistant Sports Medicine  11/25/23   Kennyth Cy RAMAN, DO  Ophthalmology  11/25/23     No chief complaint on file.   Subjective: [name] [name] is a 78 y.o.  female present for Chronic Conditions/illness Management  All past medical history, surgical history, allergies, family history, immunizations, medications and social history were updated in the electronic medical record today. All recent labs, ED visits and hospitalizations within the last year were reviewed.  Hypertension/hyperlipidemia/CKD3:  Pt reports compliance with Pravastatin  40 mg, losartan -HCTZ 50-12.5 mg daily (1/2 tab) mg and atenolol  12.5 mg.  Patient denies chest pain, shortness of breath, dizziness or lower extremity edema.  Pt does not take daily baby ASA. Pt is  prescribed statin. Labs completed today Diet: Low-sodium Exercise: Attempts RF: Hypertension, hyperlipidemia. Family history of heart disease, former smoker, obesity   Anxiety/panic attack/insomnia: Patient reports compliance with lexapro  20 mg daily, Cymbalta  day, mirtazapine  15 mg nightly.  Chronic fatigue complaints She had been on zoloft  and tapered off when lexapro  started. Cymbalta  has been at maximum dose and likely chosen secondary to her lumbar/arthritis issues.   She does take  Ativan  twice daily.She feels her mental health is doing ok on current  regimen.  She reports she still feels increased anxiety, definitely in the morning.  Also feels shaky on the inside. Noticed some mild tremors. Arthritis/pain/lumbar pain with radiation down right leg: Patient reports compliance with tramadol  50 mg twice a day when necessary for arthritic pain.She suffers from low back pain .  She has h/o anterior cervical decompression/discectomy in the past. She reports her arthritis pain has been very bad the last month.  Indication for chronic opioid: Moderate to severe arthritis and osteoporosis discomfort Medication and dose: Tramadol  50 mg TID  # pills per: #60 Last UDS date: UTD Pain contract signed (Y/N): Yes Date narcotic database last reviewed (include red flags): 07/23/24 She has been exercising, performing PT twice a week. She feels her balance is better.    excessive sweating of 1 month duration.  She reports the sweating is mostly located over her head and runs down her neck and chest.  She reports it occurs when she gets up and even just walks across the room.  She does endorse having night sweats sometimes. Denies symptoms if sitting still. She is prescribed Cymbalta , mirtazapine  and a beta-blocker, none of these medications are new to her for dose increased. She has a history of CKD 3. She denies any chest pain, dizziness or syncope with excessive sweating.  She has not been to see her cardiologist in some time. She is taking an iron  supplement secondary to low iron . She denies fatigue    04/25/2024   10:27 AM 01/25/2024    9:28 AM 11/25/2023    9:58 AM 10/26/2023    2:42 PM 05/31/2023   10:24 AM  Depression  screen PHQ 2/9  Decreased Interest 0 0 1 1 0  Down, Depressed, Hopeless 0 0 1 1 0  PHQ - 2 Score 0 0 2 2 0  Altered sleeping 0 2 1 1  0  Tired, decreased energy 3 0 3 1 3   Change in appetite 0 0 0 0 0  Feeling bad or failure about yourself  0 0 1 0 0  Trouble concentrating 0 0 0 0 0  Moving slowly or fidgety/restless 0 0 0 0 0   Suicidal thoughts 0 0 0 0 0  PHQ-9 Score 3 2 7 4 3   Difficult doing work/chores Somewhat difficult Not difficult at all Somewhat difficult Not difficult at all Somewhat difficult      01/25/2024    9:29 AM 11/25/2023    9:58 AM 05/31/2023   10:25 AM 12/07/2022    9:37 AM  GAD 7 : Generalized Anxiety Score  Nervous, Anxious, on Edge 0 1 0 0  Control/stop worrying 0 1 0 3  Worry too much - different things 0 2 0 3  Trouble relaxing 0 1 1 1   Restless 0 0 0 2  Easily annoyed or irritable 0 0 0 0  Afraid - awful might happen 0 0 1 0  Total GAD 7 Score 0 5 2 9   Anxiety Difficulty Not difficult at all Somewhat difficult Very difficult           04/25/2024   10:27 AM 01/25/2024    9:28 AM 11/25/2023    9:58 AM 11/14/2023    1:52 PM 10/26/2023    2:37 PM  Fall Risk   Falls in the past year? 1 0 1 1 1   Number falls in past yr: 1  1 1 1   Injury with Fall?   0 0 1  Risk for fall due to : No Fall Risks    Impaired balance/gait  Follow up Falls evaluation completed Falls evaluation completed Falls evaluation completed  Falls evaluation completed;Education provided;Falls prevention discussed    Immunization History  Administered Date(s) Administered   Fluad Quad(high Dose 65+) 07/04/2022   Fluad Trivalent(High Dose 65+) 05/31/2023   INFLUENZA, HIGH DOSE SEASONAL PF 07/04/2018, 06/20/2019   Influenza Split 07/02/2011   Influenza Whole 07/16/2008, 07/04/2009, 07/14/2010   Influenza,inj,Quad PF,6+ Mos 06/29/2013, 07/08/2014, 06/23/2017   Influenza-Unspecified 07/02/2015, 06/22/2019, 04/30/2021   Moderna Sars-Covid-2 Vaccination 12/20/2019, 01/17/2020, 05/23/2020, 04/30/2021   Pneumococcal Conjugate-13 05/15/2014   Pneumococcal Polysaccharide-23 10/04/2006, 09/11/2015   Td 03/19/2010   Tdap 03/10/2020   Unspecified SARS-COV-2 Vaccination 07/06/2022   Zoster Recombinant(Shingrix) 07/07/2019, 09/05/2019   Zoster, Live 07/25/2015    No results found.  Past Medical History:  Diagnosis  Date   Anxiety    Arthritis    oa   At moderate risk for fall 02/04/2021   Atypical lobular hyperplasia [facility]) of left breast 12/06/2019   Body mass index (BMI) 45.0-49.9, adult (HCC) 03/26/2021   Cardiac murmur 08/06/2020   Chronic cystitis    CKD (chronic kidney disease) stage 3, GFR 30-59 ml/min (HCC) 04/13/2017   Cystic thyroid  nodule 08/09/2018   7x10 mm, right lobe   DDD (degenerative disc disease), cervical    Depression with anxiety 11/04/2007   Current med: xanax  PRN, zoloft  100 mg qd.     Dysphagia 07/28/2018   Essential hypertension 11/04/2007   Current meds: lisinopril /HCTZ 10/12.5 mg qd; tenormin  25 mg qd     Fibromyalgia    Gait instability 01/22/2019   GERD (gastroesophageal reflux disease)  Heart murmur    History of duodenal ulcer    2007   History of gastritis    2007   Hoarseness 08/22/2015   GI- did not feel gerd, ENT did not feel ent, neuro- felt possibly ent--> sent for 2nd opinion ent.    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 This patient is considered osteoporotic according to World Health Organization [facility]) criteria. L-1, L-4 were excluded due to degenerative changes. Per the official positions of the ISCD, it is not possible to quantitatively compare BMD or calculate an [facility] between exams done at different facilities.    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 MRI scheduled. 04/16/2016    Seasonal allergies 02/19/2016   Unspecified venous (peripheral) insufficiency    wears compression hose   Urine incontinence    Vitamin D  deficiency 07/20/2008   Taking 1000 iu qd     Vitamin D  deficiency, unspecified    Allergies  Allergen Reactions   Meloxicam  Swelling    Caused edema and Cr increase   Morphine Nausea And Vomiting    SEVERE   Sulfa Antibiotics Swelling   Past Surgical History:  Procedure Laterality Date   ANTERIOR AND POSTERIOR VAGINAL REPAIR  04/29/2004   AND TRANSVAGINAL TAPE SLING   ANTERIOR CERVICAL DECOMP/DISCECTOMY FUSION  1999   APPENDECTOMY  1984   BILATERAL SALPINGOOPHORECTOMY  1984   Bladder stimulator     BREAST BIOPSY Right 03/2021   BREAST EXCISIONAL BIOPSY Left 10/31/2019   BREAST LUMPECTOMY WITH RADIOACTIVE SEED LOCALIZATION Left 10/31/2019   Procedure: LEFT BREAST LUMPECTOMY WITH RADIOACTIVE SEED LOCALIZATION;  Surgeon: Vanderbilt Ned, MD;  Location: [facility];  Service: General;  Laterality: Left;   CARDIAC CATHETERIZATION  10-03-2002   DR JODINE   NORMAL CORONARIES ARTERIES/  EF 55%   CYSTOSCOPY N/A 08/27/2013   Procedure: CYSTOSCOPY;  Surgeon: Arlena LILLETTE Gal, MD;  Location[facility];  Service: Urology;  Laterality: N/A;   CYSTOSCOPY WITH INJECTION N/A 12/17/2013   Procedure: JENNETTA WITH INJECTION;  Surgeon: Arlena LILLETTE Gal, MD;  Location: [facility];  Service: Urology;  Laterality: N/A;   DILATION AND CURETTAGE OF UTERUS     EXCISION RIGHT NECK AND LEFT BREAST SEBACEOUS CYST  05/18/2002   HEMORRHOID BANDING  2014   PUBOVAGINAL SLING N/A 10/25/2016   Procedure: CARLOYN GLADE;  Surgeon: Arlena Gal, MD;  Location: [facility];  Service: Urology;  Laterality: N/A;   right litlle  finger surgery  yrs ago   cyst removed x 4   TOTAL ABDOMINAL HYSTERECTOMY  1986   TUBAL LIGATION     VAGINAL PROLAPSE REPAIR N/A 08/27/2013   Procedure: ANTERIOR VAGINAL VAULT SUSPENSION, KELLY PLICATION WITH SACROSPINOUS  LIGAMENT FIXATION AND XENFORM BOVINE DERMIS GRAFT AUGMENTATION, URETHRAL EXPLORATION, URETHROLYSIS, EXPLANTATION OF TVT TAPE, IMPLANTATION OF FLOSEAL, IMPLANTATION OF XENFORM BOVINE GRAFT IN PERIURETHRAL SPACE;  Surgeon: Arlena LILLETTE Gal, MD;  Location: [facility];  Service: Urology;  Laterality: N/A   VAGINAL PROLAPSE REPAIR N/A 10/25/2016   Procedure: VAGINAL VAULT SUSPENSION with mesh and sacrospinous repair;  Surgeon: Arlena Gal, MD;  Location: [facility];  Service: Urology;  Laterality: N/A;   Family History  Problem Relation Age of Onset   Hypertension Mother    Heart disease Mother    Myelodysplastic syndrome Mother    Arthritis Mother    Cervical cancer Mother    Lung cancer Father    Coronary artery disease Father    Fibromyalgia Sister    Hypertension Sister    Anemia Sister    Depression Sister    Pancreatic cancer Sister        died Feb 12, 20233 mos after dx   Kidney disease Sister        kidney stones, removed kidney   Dementia Maternal Aunt    Dementia Maternal Aunt    Heart disease Maternal Uncle    Lung cancer Maternal Uncle    Lung cancer Paternal Uncle    Other Paternal Uncle        brain Tumor   Pancreatic cancer Cousin        maternal   Hypertension Brother    Anemia Brother    Hypertension Brother    Hyperlipidemia Brother    Colon polyps Other        aunt   Colon cancer Neg Hx    Breast cancer Neg Hx    BRCA 1/2 Neg Hx    Social History   Social History Narrative   She is retired and divorced and has 1 child   Occasional alcohol, former smoker no drug use   Right handed    Caffeine 2-3 cups per day   Lives at alone     Allergies as of 07/23/2024       Reactions   Meloxicam  Swelling   Caused edema  and Cr increase   Morphine Nausea And Vomiting   SEVERE   Sulfa Antibiotics Swelling        Medication List        Accurate as of July 23, 2024  7:32 AM. If you have any questions, ask your nurse or doctor.          atenolol  25 MG tablet Commonly known as: TENORMIN  Take 0.5 tablets (12.5 mg total) by mouth daily.   Cequa 0.09 % Soln Generic drug: cycloSPORINE (PF) Apply 1 drop to eye 2 (two) times daily.   cetirizine 10 MG tablet Commonly known as: ZYRTEC Take 10 mg by mouth daily as needed for allergies.   cholecalciferol  25 MCG (1000 UNIT) tablet Commonly known as: VITAMIN D3 Take 1,000 Units by mouth daily.   dicyclomine  10 MG capsule Commonly known as: BENTYL  TAKE 1 CAPSULE FOUR TIMES DAILY - BEFORE MEALS AND AT BEDTIME.   DULoxetine  60 MG capsule Commonly known as: CYMBALTA  TAKE 1 CAPSULE EVERY DAY   fesoterodine 8 MG Tb24 tablet Commonly known as: TOVIAZ   FISH OIL PO Take 1 capsule by mouth daily.   fluticasone 50 MCG/ACT nasal spray Commonly known as: FLONASE Place 1 spray into both nostrils as needed for allergies.   folic acid  1 MG tablet Commonly known as: FOLVITE  Take 1 tablet (1 mg total)  by mouth daily.   iron  polysaccharides 150 MG capsule Commonly known as: NIFEREX TAKE 1 CAPSULE ON MONDAY, WEDNESDAY AND FRIDAY.   latanoprost 0.005 % ophthalmic solution Commonly known as: XALATAN Place 1 drop into the left eye at bedtime.   LORazepam  0.5 MG tablet Commonly known as: ATIVAN  Take 1 tablet (0.5 mg total) by mouth every 12 (twelve) hours as needed for anxiety (for panic attack).   losartan -hydrochlorothiazide  50-12.5 MG tablet Commonly known as: HYZAAR TAKE 1 TABLET EVERY DAY   mirtazapine  15 MG tablet Commonly known as: REMERON  TAKE 1 TABLET AT BEDTIME   omeprazole  40 MG capsule Commonly known as: PRILOSEC Take 1 capsule (40 mg total) by mouth 2 (two) times daily before a meal. 30 mins before breakfast and supper    pravastatin  40 MG tablet Commonly known as: PRAVACHOL  Take 1 tablet (40 mg total) by mouth daily.   tamoxifen 20 MG tablet Commonly known as: NOLVADEX Take 20 mg by mouth daily.   traMADol  50 MG tablet Commonly known as: ULTRAM  Take 1 tablet (50 mg total) by mouth every 8 (eight) hours.   Turmeric Curcumin 500 MG Caps Take 1,000 mg by mouth daily.   Vitamin B-12 1000 MCG Subl Place 1 tablet under the tongue daily.   WOMENS MULTIVITAMIN PLUS PO Take 1 tablet by mouth daily.        All past medical history, surgical history, allergies, family history, immunizations andmedications were updated in the EMR today and reviewed under the history and medication portions of their EMR.      ROS: 14 pt review of systems performed and negative (unless mentioned in an HPI)  Objective: There were no vitals taken for this visit. Physical Exam Vitals and nursing note reviewed.  Constitutional:      General: She is not in acute distress.    Appearance: Normal appearance. She is not ill-appearing, toxic-appearing or diaphoretic.  HENT:     Head: Normocephalic and atraumatic.  Eyes:     General: No scleral icterus.       Right eye: No discharge.        Left eye: No discharge.     Extraocular Movements: Extraocular movements intact.     Conjunctiva/sclera: Conjunctivae normal.     Pupils: Pupils are equal, round, and reactive to light.  Cardiovascular:     Rate and Rhythm: Normal rate and regular rhythm.     Heart sounds: No murmur heard. Pulmonary:     Effort: Pulmonary effort is normal. No respiratory distress.     Breath sounds: Normal breath sounds. No wheezing, rhonchi or rales.  Musculoskeletal:     Cervical back: Neck supple.     Right lower leg: No edema.     Left lower leg: No edema.  Lymphadenopathy:     Cervical: No cervical adenopathy.  Skin:    General: Skin is warm.     Findings: No rash.  Neurological:     Mental Status: She is alert and oriented to person,  place, and time. Mental status is at baseline.     Motor: No weakness.     Gait: Gait normal.  Psychiatric:        Mood and Affect: Mood normal.        Behavior: Behavior normal.        Thought Content: Thought content normal.        Judgment: Judgment normal.    Assessment/plan: [name] [name] is a 78 y.o. female present for [facility]  Depression with anxiety/insomnia/panic d/o/benzo use Stable Discontinue Lexapro  20 mg daily.  Once we receive lab work back, we will replace with a different medication.  Questioning if this is causing her to feel tremulous. Continue Cymbalta  60 mg qd Continue Remeron  15 mg nightly in its place. Continue Ativan  provided for 6 months. NCCS database reviewed and appropriate 07/23/24 - UDS -collected today - Controlled substance contract signed. - pt declined psychology referral again today.    Hypertension/morbid obesity/hyperlipidemia//palpitations: Not at goal Increase losartan -HCTZ> increase to whole tab Continue atenolol  to 12.5 mg daily. If palpitations worsen, can return to atenolol  25 mg daily.   Continue statin - diet and exercise modifications recommended.  Patient instructed to always take her blood pressure medications daily whether fasting or not. CBC, TSH and lipids collected today  CKD3: Renal dose medications.  Avoid nsaids  hydrate-patient needs to hydrate more, she does not drink much fluid daily. CMP collected today  Vitamin D  deficiency/osteoporosis:  Continue vit d supplement reclast  - last tx d/t to 5 yr >01/2021 Vitamin D  collected today  Arthritis/pain/lumbar pain: Stable Continue tramadol  to TID, for worsening chronic arthritic pain.  - UDS collected today - contract up-to-date. - NCCS database reviewed 07/23/24 and appropriate.    Atypical lobular hyperplasia (ALH) of left breast Following with surgical team  Pernicious anemia Continue B12- sublingual for best absorption.   Other irritable bowel  syndrome Stable Continue Bentyl  as needed  Excessive sweating (Primary) We discussed differential diagnoses that can be responsible for excessive sweating.  Avoiding events seem to be occurring after patient had minimal physical activity, never when at rest on the couch, but does happen during sleep sometimes. None of her medications have changed, but she is on medications with the side effect profile of excessive sweating. She is on iron  supplement-therefore we will check iron  levels make sure not over supplementing. Rule out thyroid  disorder in sure blood cell counts are normal. Discussed possible referral to cardiology for further evaluation since this is occurring with minimal exertion. She is prescribed Toviaz at high dose, with her kidney function she may need to lower this dose anyway.  Could consider switching to oxybutynin 5 mg extended release and see if this helps with her excessive sweating.  Will await labs and discuss follow-up Iron  tsh and cbcc *** No follow-ups on file.  No orders of the defined types were placed in this encounter.   No orders of the defined types were placed in this encounter.   Referral Orders  No referral(s) requested today      Note is dictated utilizing voice recognition software. Although note has been proof read prior to signing, occasional typographical errors still can be missed. If any questions arise, please do not hesitate to call for verification.  Electronically signed by: Charlies Bellini, DO [facility] [place]

## 2024-07-23 NOTE — Patient Instructions (Incomplete)

## 2024-07-24 ENCOUNTER — Encounter: Payer: Self-pay | Admitting: Family Medicine

## 2024-07-24 ENCOUNTER — Ambulatory Visit (INDEPENDENT_AMBULATORY_CARE_PROVIDER_SITE_OTHER): Admitting: Family Medicine

## 2024-07-24 VITALS — BP 126/78 | HR 89 | Temp 98.2°F | Wt 206.2 lb

## 2024-07-24 DIAGNOSIS — R61 Generalized hyperhidrosis: Secondary | ICD-10-CM

## 2024-07-24 DIAGNOSIS — E781 Pure hyperglyceridemia: Secondary | ICD-10-CM

## 2024-07-24 DIAGNOSIS — I10 Essential (primary) hypertension: Secondary | ICD-10-CM | POA: Diagnosis not present

## 2024-07-24 DIAGNOSIS — F419 Anxiety disorder, unspecified: Secondary | ICD-10-CM

## 2024-07-24 DIAGNOSIS — E041 Nontoxic single thyroid nodule: Secondary | ICD-10-CM

## 2024-07-24 DIAGNOSIS — Z23 Encounter for immunization: Secondary | ICD-10-CM

## 2024-07-24 DIAGNOSIS — R7989 Other specified abnormal findings of blood chemistry: Secondary | ICD-10-CM | POA: Diagnosis not present

## 2024-07-24 DIAGNOSIS — R112 Nausea with vomiting, unspecified: Secondary | ICD-10-CM

## 2024-07-24 DIAGNOSIS — N6092 Unspecified benign mammary dysplasia of left breast: Secondary | ICD-10-CM

## 2024-07-24 DIAGNOSIS — I700 Atherosclerosis of aorta: Secondary | ICD-10-CM

## 2024-07-24 DIAGNOSIS — M5030 Other cervical disc degeneration, unspecified cervical region: Secondary | ICD-10-CM

## 2024-07-24 DIAGNOSIS — N1832 Chronic kidney disease, stage 3b: Secondary | ICD-10-CM

## 2024-07-24 LAB — COMPREHENSIVE METABOLIC PANEL WITH GFR
ALT: 10 U/L (ref 0–35)
AST: 13 U/L (ref 0–37)
Albumin: 4.1 g/dL (ref 3.5–5.2)
Alkaline Phosphatase: 50 U/L (ref 39–117)
BUN: 20 mg/dL (ref 6–23)
CO2: 31 meq/L (ref 19–32)
Calcium: 9.2 mg/dL (ref 8.4–10.5)
Chloride: 104 meq/L (ref 96–112)
Creatinine, Ser: 1.19 mg/dL (ref 0.40–1.20)
GFR: 44.15 mL/min — ABNORMAL LOW (ref >60.00)
Glucose, Bld: 88 mg/dL (ref 70–99)
Potassium: 3.8 meq/L (ref 3.5–5.1)
Sodium: 143 meq/L (ref 135–145)
Total Bilirubin: 0.4 mg/dL (ref 0.2–1.2)
Total Protein: 6.5 g/dL (ref 6.0–8.3)

## 2024-07-24 LAB — T3, FREE: T3, Free: 3 pg/mL (ref 2.3–4.2)

## 2024-07-24 LAB — TSH: TSH: 2.11 u[IU]/mL (ref 0.35–5.50)

## 2024-07-24 LAB — LIPASE: Lipase: 14 U/L (ref 11.0–59.0)

## 2024-07-24 MED ORDER — LORAZEPAM 0.5 MG PO TABS: 0.5000 mg | ORAL_TABLET | Freq: Two times a day (BID) | ORAL | 1 refills | Status: AC | PRN

## 2024-07-24 MED ORDER — ATENOLOL 25 MG PO TABS: 12.5000 mg | ORAL_TABLET | Freq: Every day | ORAL | 3 refills | Status: AC

## 2024-07-24 MED ORDER — LOSARTAN POTASSIUM-HCTZ 50-12.5 MG PO TABS
1.0000 | ORAL_TABLET | Freq: Every day | ORAL | 1 refills | Status: AC
Start: 2024-07-24 — End: ?

## 2024-07-24 MED ORDER — MIRTAZAPINE 15 MG PO TABS: 15.0000 mg | ORAL_TABLET | Freq: Every day | ORAL | 1 refills | Status: AC

## 2024-07-24 MED ORDER — DULOXETINE HCL 60 MG PO CPEP: 60.0000 mg | ORAL_CAPSULE | Freq: Every day | ORAL | 1 refills | Status: AC

## 2024-07-24 MED ORDER — POLYSACCHARIDE IRON COMPLEX 150 MG PO CAPS: ORAL_CAPSULE | ORAL | 3 refills | Status: AC

## 2024-07-24 MED ORDER — TRAMADOL HCL 50 MG PO TABS: 50.0000 mg | ORAL_TABLET | Freq: Three times a day (TID) | ORAL | 1 refills | Status: AC

## 2024-07-24 MED ORDER — PRAVASTATIN SODIUM 40 MG PO TABS: 40.0000 mg | ORAL_TABLET | Freq: Every day | ORAL | 3 refills | Status: AC

## 2024-07-24 MED ORDER — OMEPRAZOLE 40 MG PO CPDR: 40.0000 mg | DELAYED_RELEASE_CAPSULE | Freq: Two times a day (BID) | ORAL | 3 refills | Status: AC

## 2024-07-24 NOTE — Patient Instructions (Signed)

## 2024-07-24 NOTE — Progress Notes (Unsigned)
 Patient ID: [name] [name], female  DOB: [birth year], 78 y.o.   MRN: [id number] Patient Care Team    Relationship Specialty Notifications Start End  Catherine Charlies LABOR, DO PCP - General Family Medicine  06/02/15   Avram Lupita BRAVO, MD Consulting Physician Gastroenterology  11/24/15   Pa, [facility]    01/16/18   Rheumatology, [facility]    01/16/18   Jenel Carlin POUR, MD (Inactive) Consulting Physician Neurology  10/24/18   Powell Sorrow, MD Consulting Physician Cardiology  10/31/22   Vanderbilt Ned, MD Consulting Physician General Surgery  11/25/23   Brenna Lonni PARAS., MD Physician Assistant Sports Medicine  11/25/23   Kennyth Cy RAMAN, DO  Ophthalmology  11/25/23     Chief Complaint  Patient presents with   Hypertension    Subjective: [name] [name] is a 78 y.o.  female present for Chronic Conditions/illness Management All past medical history, surgical history, allergies, family history, immunizations, medications and social history were updated in the electronic medical record today. All recent labs, ED visits and hospitalizations within the last year were reviewed.  Hypertension/hyperlipidemia/CKD3:  Pt reports compliance with Pravastatin  40 mg, losartan -HCTZ 50-12.5 mg daily (1/2 tab) mg and atenolol  12.5 mg.  Patient denies chest pain, shortness of breath, dizziness or lower extremity edema.   Pt does not take daily baby ASA. Pt is  prescribed statin. Labs completed today Diet: Low-sodium Exercise: Attempts RF: Hypertension, hyperlipidemia. Family history of heart disease, former smoker, obesity   Anxiety/panic attack/insomnia: Patient reports compliance with , Cymbalta  day, mirtazapine  15 mg nightly.  Chronic fatigue complaints She had been on zoloft  and tapered off when lexapro  started. Cymbalta  has been at maximum dose and likely chosen secondary to her lumbar/arthritis issues.   She does take  Ativan  twice daily.She feels her mental health is doing ok on  current regimen.  She reports she still feels increased anxiety, definitely in the morning.  Also feels shaky on the inside. Noticed some mild tremors.  Arthritis/pain/lumbar pain with radiation down right leg: Patient reports compliance with tramadol  50 mg twice a day when necessary for arthritic pain.She suffers from low back pain .  She has h/o anterior cervical decompression/discectomy in the past. She reports her arthritis pain has been very bad the last month.  Indication for chronic opioid: Moderate to severe arthritis and osteoporosis discomfort Medication and dose: Tramadol  50 mg TID  # pills per: #60 Last UDS date: UTD Pain contract signed (Y/N): Yes Date narcotic database last reviewed (include red flags): 07/26/24 She has been exercising, performing PT twice a week. She feels her balance is better.   Excessive sweating: Pt presents for an OV with complaints of excessive sweating of 1 month duration.  She reports the sweating is mostly located over her head and runs down her neck and chest.  She reports it occurs when she gets up and even just walks across the room.  She does endorse having night sweats sometimes. Denies symptoms if sitting still. She is prescribed Cymbalta , mirtazapine  and a beta-blocker, none of these medications are new to her for dose increased. She has a history of CKD 3. She denies any chest pain, dizziness or syncope with excessive sweating.  She has not been to see her cardiologist in some time. She is taking an iron  supple     07/24/2024   11:43 AM 04/25/2024   10:27 AM 01/25/2024    9:28 AM 11/25/2023    9:58 AM 10/26/2023  2:42 PM  Depression screen PHQ 2/9  Decreased Interest 0 0 0 1 1  Down, Depressed, Hopeless 1 0 0 1 1  PHQ - 2 Score 1 0 0 2 2  Altered sleeping 1 0 2 1 1   Tired, decreased energy 2 3 0 3 1  Change in appetite 0 0 0 0 0  Feeling bad or failure about yourself  0 0 0 1 0  Trouble concentrating 0 0 0 0 0  Moving slowly or  fidgety/restless 0 0 0 0 0  Suicidal thoughts 0 0 0 0 0  PHQ-9 Score 4 3 2 7 4   Difficult doing work/chores Not difficult at all Somewhat difficult Not difficult at all Somewhat difficult Not difficult at all      07/24/2024   11:43 AM 01/25/2024    9:29 AM 11/25/2023    9:58 AM 05/31/2023   10:25 AM  GAD 7 : Generalized Anxiety Score  Nervous, Anxious, on Edge 1 0 1 0  Control/stop worrying 0 0 1 0  Worry too much - different things 1 0 2 0  Trouble relaxing 1 0 1 1  Restless 0 0 0 0  Easily annoyed or irritable 0 0 0 0  Afraid - awful might happen 0 0 0 1  Total GAD 7 Score 3 0 5 2  Anxiety Difficulty Not difficult at all Not difficult at all Somewhat difficult Very difficult          07/24/2024   11:43 AM 04/25/2024   10:27 AM 01/25/2024    9:28 AM 11/25/2023    9:58 AM 11/14/2023    1:52 PM  Fall Risk   Falls in the past year? 1 1 0 1 1  Number falls in past yr: 0 1  1 1   Injury with Fall? 0   0 0  Risk for fall due to :  No Fall Risks     Follow up Falls evaluation completed Falls evaluation completed Falls evaluation completed Falls evaluation completed     Immunization History  Administered Date(s) Administered   Fluad Quad(high Dose 65+) 07/04/2022   Fluad Trivalent(High Dose 65+) 05/31/2023   INFLUENZA, HIGH DOSE SEASONAL PF 07/04/2018, 06/20/2019, 07/24/2024   Influenza Split 07/02/2011   Influenza Whole 07/16/2008, 07/04/2009, 07/14/2010   Influenza,inj,Quad PF,6+ Mos 06/29/2013, 07/08/2014, 06/23/2017   Influenza-Unspecified 07/02/2015, 06/22/2019, 04/30/2021   Moderna Sars-Covid-2 Vaccination 12/20/2019, 01/17/2020, 05/23/2020, 04/30/2021   Pneumococcal Conjugate-13 05/15/2014   Pneumococcal Polysaccharide-23 10/04/2006, 09/11/2015   Td 03/19/2010   Tdap 03/10/2020   Unspecified SARS-COV-2 Vaccination 07/06/2022   Zoster Recombinant(Shingrix) 07/07/2019, 09/05/2019   Zoster, Live 07/25/2015    No results found.  Past Medical History:  Diagnosis Date    Anxiety    Arthritis    oa   At moderate risk for fall 02/04/2021   Atypical lobular hyperplasia [facility]) of left breast 12/06/2019   Body mass index (BMI) 45.0-49.9, adult (HCC) 03/26/2021   Cardiac murmur 08/06/2020   Chronic cystitis    CKD (chronic kidney disease) stage 3, GFR 30-59 ml/min (HCC) 04/13/2017   Cystic thyroid  nodule 08/09/2018   7x10 mm, right lobe   DDD (degenerative disc disease), cervical    Depression with anxiety 11/04/2007   Current med: xanax  PRN, zoloft  100 mg qd.     Dysphagia 07/28/2018   Essential hypertension 11/04/2007   Current meds: lisinopril /HCTZ 10/12.5 mg qd; tenormin  25 mg qd     Fibromyalgia    Folate deficiency 11/29/2023   Gait  instability 01/22/2019   GERD (gastroesophageal reflux disease)    Heart murmur    History of duodenal ulcer    2007   History of gastritis    2007   Hoarseness 08/22/2015   GI- did not feel gerd, ENT did not feel ent, neuro- felt possibly ent--> sent for 2nd opinion ent.    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 This patient is considered osteoporotic according to World Health Organization [facility]) criteria. L-1, L-4 were excluded due to degenerative changes. Per the official positions of the ISCD, it is not possible to quantitatively compare BMD or calculate an [facility] between exams done at different facilities.    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 MRI scheduled. 04/16/2016    Seasonal allergies 02/19/2016   Unspecified venous (peripheral) insufficiency    wears compression hose   Urine incontinence    Vitamin D  deficiency 07/20/2008   Taking 1000 iu qd     Vitamin D  deficiency, unspecified    Allergies  Allergen Reactions   Meloxicam  Swelling    Caused edema and Cr increase   Morphine Nausea And Vomiting    SEVERE   Sulfa Antibiotics Swelling   Past Surgical History:  Procedure Laterality Date   ANTERIOR AND POSTERIOR VAGINAL REPAIR  04/29/2004   AND TRANSVAGINAL TAPE SLING   ANTERIOR CERVICAL DECOMP/DISCECTOMY FUSION  1999   APPENDECTOMY  1984   BILATERAL SALPINGOOPHORECTOMY  1984   Bladder stimulator     BREAST BIOPSY Right 03/2021   BREAST EXCISIONAL BIOPSY Left 10/31/2019   BREAST LUMPECTOMY WITH RADIOACTIVE SEED LOCALIZATION Left 10/31/2019   Procedure: LEFT BREAST LUMPECTOMY WITH RADIOACTIVE SEED LOCALIZATION;  Surgeon: Vanderbilt Ned, MD;  Location: [facility];  Service: General;  Laterality: Left;   CARDIAC CATHETERIZATION  10-03-2002   DR JODINE   NORMAL CORONARIES ARTERIES/  EF 55%   CYSTOSCOPY N/A 08/27/2013   Procedure: CYSTOSCOPY;  Surgeon: Arlena LILLETTE Gal, MD;  Location: [facility];  Service: Urology;  Laterality: N/A;   CYSTOSCOPY WITH INJECTION N/A 12/17/2013   Procedure: JENNETTA WITH INJECTION;  Surgeon: Arlena LILLETTE Gal, MD;  Location: [facility];  Service: Urology;  Laterality: N/A;   DILATION AND CURETTAGE OF UTERUS     EXCISION RIGHT NECK AND LEFT BREAST SEBACEOUS CYST  05/18/2002   HEMORRHOID BANDING  2014   PUBOVAGINAL SLING N/A 10/25/2016   Procedure: CARLOYN GLADE;  Surgeon: Arlena Gal, MD;  Location: [facility];  Service: Urology;   Laterality: N/A;   right litlle finger surgery  yrs ago   cyst removed x 4   TOTAL ABDOMINAL HYSTERECTOMY  1986   TUBAL LIGATION     VAGINAL PROLAPSE REPAIR N/A 08/27/2013  Procedure: ANTERIOR VAGINAL VAULT SUSPENSION, KELLY PLICATION WITH SACROSPINOUS LIGAMENT FIXATION AND XENFORM BOVINE DERMIS GRAFT AUGMENTATION, URETHRAL EXPLORATION, URETHROLYSIS, EXPLANTATION OF TVT TAPE, IMPLANTATION OF FLOSEAL, IMPLANTATION OF XENFORM BOVINE GRAFT IN PERIURETHRAL SPACE;  Surgeon: Arlena LILLETTE Gal, MD;  Location[facility];  Service: Urology;  Laterality: N/A   VAGINAL PROLAPSE REPAIR N/A 10/25/2016   Procedure: VAGINAL VAULT SUSPENSION with mesh and sacrospinous repair;  Surgeon: Arlena Gal, MD;  Location: [facility];  Service: Urology;  Laterality: N/A;   Family History  Problem Relation Age of Onset   Hypertension Mother    Heart disease Mother    Myelodysplastic syndrome Mother    Arthritis Mother    Cervical cancer Mother    Lung cancer Father    Coronary artery disease Father    Fibromyalgia Sister    Hypertension Sister    Anemia Sister    Depression Sister    Pancreatic cancer Sister        died 02-06-20233 mos after dx   Kidney disease Sister        kidney stones, removed kidney   Dementia Maternal Aunt    Dementia Maternal Aunt    Heart disease Maternal Uncle    Lung cancer Maternal Uncle    Lung cancer Paternal Uncle    Other Paternal Uncle        brain Tumor   Pancreatic cancer Cousin        maternal   Hypertension Brother    Anemia Brother    Hypertension Brother    Hyperlipidemia Brother    Colon polyps Other        aunt   Colon cancer Neg Hx    Breast cancer Neg Hx    BRCA 1/2 Neg Hx    Social History   Social History Narrative   She is retired and divorced and has 1 child   Occasional alcohol, former smoker no drug use   Right handed    Caffeine 2-3 cups per day   Lives at alone     Allergies as of 07/24/2024       Reactions    Meloxicam  Swelling   Caused edema and Cr increase   Morphine Nausea And Vomiting   SEVERE   Sulfa Antibiotics Swelling        Medication List        Accurate as of July 24, 2024 11:59 PM. If you have any questions, ask your nurse or doctor.          STOP taking these medications    dicyclomine  10 MG capsule Commonly known as: BENTYL  Stopped by: Charlies Bellini   fesoterodine 8 MG Tb24 tablet Commonly known as: TOVIAZ Stopped by: Charlies Bellini       TAKE these medications    atenolol  25 MG tablet Commonly known as: TENORMIN  Take 0.5 tablets (12.5 mg total) by mouth daily.   Cequa 0.09 % Soln Generic drug: cycloSPORINE (PF) Apply 1 drop to eye 2 (two) times daily.   cetirizine 10 MG tablet Commonly known as: ZYRTEC Take 10 mg by mouth daily as needed for allergies.   cholecalciferol  25 MCG (1000 UNIT) tablet Commonly known as: VITAMIN D3 Take 1,000 Units by mouth daily.   DULoxetine  60 MG capsule Commonly known as: CYMBALTA  Take 1 capsule (60 mg total) by mouth daily.   FISH OIL PO Take 1 capsule by mouth daily.   fluticasone 50 MCG/ACT nasal spray Commonly known as: FLONASE Place 1  spray into both nostrils as needed for allergies.   folic acid  1 MG tablet Commonly known as: FOLVITE  Take 1 tablet (1 mg total) by mouth daily.   iron  polysaccharides 150 MG capsule Commonly known as: NIFEREX TAKE 1 CAPSULE ON MONDAY, WEDNESDAY AND FRIDAY.   latanoprost 0.005 % ophthalmic solution Commonly known as: XALATAN Place 1 drop into the left eye at bedtime.   LORazepam  0.5 MG tablet Commonly known as: ATIVAN  Take 1 tablet (0.5 mg total) by mouth every 12 (twelve) hours as needed for anxiety (for panic attack).   losartan -hydrochlorothiazide  50-12.5 MG tablet Commonly known as: HYZAAR Take 1 tablet by mouth daily.   mirtazapine  15 MG tablet Commonly known as: REMERON  Take 1 tablet (15 mg total) by mouth at bedtime.   omeprazole  40 MG  capsule Commonly known as: PRILOSEC Take 1 capsule (40 mg total) by mouth 2 (two) times daily before a meal. 30 mins before breakfast and supper   oxybutynin 10 MG 24 hr tablet Commonly known as: DITROPAN-XL Take 1 tablet (10 mg total) by mouth at bedtime. Started by: Charlies Bellini   pravastatin  40 MG tablet Commonly known as: PRAVACHOL  Take 1 tablet (40 mg total) by mouth daily.   tamoxifen 20 MG tablet Commonly known as: NOLVADEX Take 20 mg by mouth daily.   traMADol  50 MG tablet Commonly known as: ULTRAM  Take 1 tablet (50 mg total) by mouth every 8 (eight) hours.   Turmeric Curcumin 500 MG Caps Take 1,000 mg by mouth daily.   Vitamin B-12 1000 MCG Subl Place 1 tablet under the tongue daily.   WOMENS MULTIVITAMIN PLUS PO Take 1 tablet by mouth daily.        All past medical history, surgical history, allergies, family history, immunizations andmedications were updated in the EMR today and reviewed under the history and medication portions of their EMR.      ROS: 14 pt review of systems performed and negative (unless mentioned in an HPI)  Objective: BP 126/78   Pulse 89   Temp 98.2 F (36.8 C)   Wt 206 lb 3.2 oz (93.5 kg)   SpO2 97%   BMI 34.31 kg/m  Physical Exam Vitals and nursing note reviewed.  Constitutional:      General: She is not in acute distress.    Appearance: Normal appearance. She is not ill-appearing, toxic-appearing or diaphoretic.  HENT:     Head: Normocephalic and atraumatic.  Eyes:     General: No scleral icterus.       Right eye: No discharge.        Left eye: No discharge.     Extraocular Movements: Extraocular movements intact.     Conjunctiva/sclera: Conjunctivae normal.     Pupils: Pupils are equal, round, and reactive to light.  Cardiovascular:     Rate and Rhythm: Normal rate and regular rhythm.     Heart sounds: No murmur heard. Pulmonary:     Effort: Pulmonary effort is normal. No respiratory distress.     Breath sounds:  Normal breath sounds. No wheezing, rhonchi or rales.  Musculoskeletal:     Cervical back: Neck supple.     Right lower leg: No edema.     Left lower leg: No edema.  Lymphadenopathy:     Cervical: No cervical adenopathy.  Skin:    General: Skin is warm.     Findings: No rash.  Neurological:     Mental Status: She is alert and oriented to person, place, and  time. Mental status is at baseline.     Motor: No weakness.     Gait: Gait normal.  Psychiatric:        Mood and Affect: Mood normal.        Behavior: Behavior normal.        Thought Content: Thought content normal.        Judgment: Judgment normal.    Assessment/plan: DAZARIA MACNEILL is a 78 y.o. female present for [facility] Depression with anxiety/insomnia/panic d/o/benzo use Stable Continue Cymbalta  60 mg qd Continue Remeron  15 mg nightly in its place. Continue Ativan  provided for 6 months. NCCS database reviewed and appropriate 07/26/24 - UDS -UTD 11/2023 - Controlled substance contract signed. - pt declined psychology referral again today.    Hypertension/morbid obesity/hyperlipidemia//palpitations/aortic atherosclerosis: Stable Continue losartan -HCTZ 50-12.5 mg daily Continue atenolol  to 12.5 mg daily. If palpitations worsen, can return to atenolol  25 mg daily.   Continue statin - diet and exercise modifications recommended.  Patient instructed to always take her blood pressure medications daily whether fasting or not.  CKD3/Elevated parathyroid hormone: Renal dose medications.  Avoid nsaids  hydrate-patient needs to hydrate more, she does not drink much fluid daily. CMP, PTH and calcium collected today  Vitamin D  deficiency/osteoporosis:  Continue vit d supplement reclast  - last tx d/t to 5 yr >01/2021  GERD: Stable Continue omeprazole  40 mg twice daily  Nausea/thyroid  cyst: Patient reports she had an event where she became extremely warm and then nauseated.  She did vomit x 1, that has not occurred since.   Possibly secondary to excessive sweating/heat tolerance. CMP and lipase collected today With her history of a thyroid  mass, which was considered a septated 1 cm cyst, elected to move forward with thyroid  ultrasound despite formal thyroid  labs to ensure stability.  Arthritis/pain/lumbar pain/ DDD (degenerative disc disease), cervical: Stable Continue tramadol  to TID, for worsening chronic arthritic pain.  - UDS collected today - contract up-to-date. - NCCS database reviewed 07/26/24 and appropriate.    Atypical lobular hyperplasia (ALH) of left breast Following with surgical team  Pernicious anemia Continue B12- sublingual for best absorption.   Other irritable bowel syndrome Stable Mild volume Bentyl  as needed  Overactive bladder: Stop Toviaz 8 mg extended release, start oxybutynin 10 mg extended release.  Excessive sweating  We discussed differential diagnoses that can be responsible for excessive sweating.  events seem to be occurring after patient had minimal physical activity, never when at rest on the couch, but does happen during sleep sometimes. - she is on medications with the side effect profile of excessive sweating. -Laboratory workup: Iron  within normal limits, CBC within normal limits, TSH within normal limits -Has established with cardiology and is receiving workup, patient reports cardiology does not feel the excessive sweating is coming from her heart at this time. -Discussed oxybutynin in place of Toviaz, to see if she gets the added benefit of hyperhidrosis control.  She is agreeable to this.  Influenza vaccine needed - Flu vaccine HIGH DOSE PF(Fluzone Trivalent)    Return in about 24 weeks (around 01/08/2025).  Orders Placed This Encounter  Procedures   US  THYROID    Flu vaccine HIGH DOSE PF(Fluzone Trivalent)   PTH, Intact and Calcium   Comp Met (CMET)   Lipase   TSH   T3, free    Meds ordered this encounter  Medications   atenolol  (TENORMIN ) 25 MG  tablet    Sig: Take 0.5 tablets (12.5 mg total) by mouth daily.    Dispense:  45 tablet    Refill:  3   iron  polysaccharides (NIFEREX) 150 MG capsule    Sig: TAKE 1 CAPSULE ON MONDAY, WEDNESDAY AND FRIDAY.    Dispense:  36 capsule    Refill:  3   LORazepam  (ATIVAN ) 0.5 MG tablet    Sig: Take 1 tablet (0.5 mg total) by mouth every 12 (twelve) hours as needed for anxiety (for panic attack).    Dispense:  180 tablet    Refill:  1   losartan -hydrochlorothiazide  (HYZAAR) 50-12.5 MG tablet    Sig: Take 1 tablet by mouth daily.    Dispense:  90 tablet    Refill:  1   mirtazapine  (REMERON ) 15 MG tablet    Sig: Take 1 tablet (15 mg total) by mouth at bedtime.    Dispense:  90 tablet    Refill:  1   omeprazole  (PRILOSEC) 40 MG capsule    Sig: Take 1 capsule (40 mg total) by mouth 2 (two) times daily before a meal. 30 mins before breakfast and supper    Dispense:  180 capsule    Refill:  3   pravastatin  (PRAVACHOL ) 40 MG tablet    Sig: Take 1 tablet (40 mg total) by mouth daily.    Dispense:  90 tablet    Refill:  3   traMADol  (ULTRAM ) 50 MG tablet    Sig: Take 1 tablet (50 mg total) by mouth every 8 (eight) hours.    Dispense:  270 tablet    Refill:  1   DULoxetine  (CYMBALTA ) 60 MG capsule    Sig: Take 1 capsule (60 mg total) by mouth daily.    Dispense:  90 capsule    Refill:  1   oxybutynin (DITROPAN-XL) 10 MG 24 hr tablet    Sig: Take 1 tablet (10 mg total) by mouth at bedtime.    Dispense:  90 tablet    Refill:  1    DC Toviaz    Referral Orders  No referral(s) requested today      Note is dictated utilizing voice recognition software. Although note has been proof read prior to signing, occasional typographical errors still can be missed. If any questions arise, please do not hesitate to call for verification.  Electronically signed by: Charlies Bellini, DO [facility]

## 2024-07-25 ENCOUNTER — Encounter: Payer: Self-pay | Admitting: Family Medicine

## 2024-07-25 ENCOUNTER — Ambulatory Visit: Payer: Self-pay | Admitting: Family Medicine

## 2024-07-25 LAB — PTH, INTACT AND CALCIUM
Calcium: 9.6 mg/dL (ref 8.6–10.4)
PTH: 39 pg/mL (ref 16–77)

## 2024-07-25 MED ORDER — OXYBUTYNIN CHLORIDE ER 10 MG PO TB24: 10.0000 mg | ORAL_TABLET | Freq: Every day | ORAL | 1 refills | Status: AC

## 2024-07-26 ENCOUNTER — Encounter: Payer: Self-pay | Admitting: Family Medicine

## 2024-07-26 ENCOUNTER — Other Ambulatory Visit: Payer: Self-pay | Admitting: Family Medicine

## 2024-07-26 NOTE — Telephone Encounter (Signed)
 Communication  Reason for CRM: Patient returned Aerielle Stoklosa's phone - I relayed lab results to patient and she had no further questions.    No further action needed at this time.

## 2024-07-29 DIAGNOSIS — F411 Generalized anxiety disorder: Secondary | ICD-10-CM | POA: Diagnosis not present

## 2024-07-29 DIAGNOSIS — Z9181 History of falling: Secondary | ICD-10-CM | POA: Diagnosis not present

## 2024-07-29 DIAGNOSIS — I700 Atherosclerosis of aorta: Secondary | ICD-10-CM | POA: Diagnosis not present

## 2024-07-29 DIAGNOSIS — Z880 Allergy status to penicillin: Secondary | ICD-10-CM | POA: Diagnosis not present

## 2024-07-29 DIAGNOSIS — M1990 Unspecified osteoarthritis, unspecified site: Secondary | ICD-10-CM | POA: Diagnosis not present

## 2024-07-29 DIAGNOSIS — K219 Gastro-esophageal reflux disease without esophagitis: Secondary | ICD-10-CM | POA: Diagnosis not present

## 2024-07-29 DIAGNOSIS — M810 Age-related osteoporosis without current pathological fracture: Secondary | ICD-10-CM | POA: Diagnosis not present

## 2024-07-29 DIAGNOSIS — E785 Hyperlipidemia, unspecified: Secondary | ICD-10-CM | POA: Diagnosis not present

## 2024-07-29 DIAGNOSIS — E669 Obesity, unspecified: Secondary | ICD-10-CM | POA: Diagnosis not present

## 2024-07-29 DIAGNOSIS — J302 Other seasonal allergic rhinitis: Secondary | ICD-10-CM | POA: Diagnosis not present

## 2024-07-29 DIAGNOSIS — I129 Hypertensive chronic kidney disease with stage 1 through stage 4 chronic kidney disease, or unspecified chronic kidney disease: Secondary | ICD-10-CM | POA: Diagnosis not present

## 2024-07-29 DIAGNOSIS — N3941 Urge incontinence: Secondary | ICD-10-CM | POA: Diagnosis not present

## 2024-07-29 DIAGNOSIS — Z823 Family history of stroke: Secondary | ICD-10-CM | POA: Diagnosis not present

## 2024-07-29 DIAGNOSIS — N189 Chronic kidney disease, unspecified: Secondary | ICD-10-CM | POA: Diagnosis not present

## 2024-07-29 DIAGNOSIS — K581 Irritable bowel syndrome with constipation: Secondary | ICD-10-CM | POA: Diagnosis not present

## 2024-07-29 DIAGNOSIS — Z882 Allergy status to sulfonamides status: Secondary | ICD-10-CM | POA: Diagnosis not present

## 2024-07-29 DIAGNOSIS — Z9989 Dependence on other enabling machines and devices: Secondary | ICD-10-CM | POA: Diagnosis not present

## 2024-09-19 ENCOUNTER — Ambulatory Visit: Admitting: Emergency Medicine

## 2024-10-08 ENCOUNTER — Ambulatory Visit: Payer: Self-pay

## 2024-10-08 NOTE — Telephone Encounter (Signed)
 Spoke with pt. Pt scheduled for tomorrow with alternate provider.

## 2024-10-08 NOTE — Telephone Encounter (Signed)
 FYI Only or Action Required?: Action required by provider: request for appointment. No appts until tomorrow, No UC close to pt, internet not reliable. Wants to be worked in for today.  Patient was last seen in primary care on 07/24/2024 by Catherine Fuller A, DO.  Called Nurse Triage reporting Wheezing. cough  Symptoms began several days ago.  Interventions attempted: Nothing.  Symptoms are: gradually worsening.  Triage Disposition: See HCP Within 4 Hours (Or PCP Triage)  Patient/caregiver understands and will follow disposition?: Unsure                      Copied from CRM #[id number]. Topic: Clinical - Red Word Triage >> Oct 08, 2024  9:08 AM Rea ORN wrote: Red Word that prompted transfer to Nurse Triage: Lingering productive cough. Pt would like to be seen before 12 because she has to work. Reason for Disposition  [1] MILD difficulty breathing (e.g., minimal/no SOB at rest, SOB with walking, pulse < 100) AND [2] NEW-onset or WORSE than normal  Answer Assessment - Initial Assessment Questions 1. RESPIRATORY STATUS: Describe your breathing? (e.g., wheezing, shortness of breath, unable to speak, severe coughing)      Wheezing - cough 2. ONSET: When did this breathing problem begin?      tuesday 3. PATTERN Does the difficult breathing come and go, or has it been constant since it started?      All the time 4. SEVERITY: How bad is your breathing? (e.g., mild, moderate, severe)      mild 5. RECURRENT SYMPTOM: Have you had difficulty breathing before? If Yes, ask: When was the last time? and What happened that time?      Several years ago 6. CARDIAC HISTORY: Do you have any history of heart disease? (e.g., heart attack, angina, bypass surgery, angioplasty)      no 7. LUNG HISTORY: Do you have any history of lung disease?  (e.g., pulmonary embolus, asthma, emphysema)     Hx of pneumonia 8. CAUSE: What do you think is causing the breathing problem?       unsure 9. OTHER SYMPTOMS: Do you have any other symptoms? (e.g., chest pain, cough, dizziness, fever, runny nose)     Mild sinus pressure, HA 10. O2 SATURATION MONITOR:  Do you use an oxygen saturation monitor (pulse oximeter) at home? If Yes, ask: What is your reading (oxygen level) today? What is your usual oxygen saturation reading? (e.g., 95%)       Does not have one  Protocols used: Breathing Difficulty-A-AH

## 2024-10-09 ENCOUNTER — Encounter: Payer: Self-pay | Admitting: Family Medicine

## 2024-10-09 ENCOUNTER — Ambulatory Visit: Admitting: Sports Medicine

## 2024-10-09 VITALS — BP 152/86 | HR 98 | Temp 96.3°F | Ht 65.0 in | Wt 204.0 lb

## 2024-10-09 DIAGNOSIS — J069 Acute upper respiratory infection, unspecified: Secondary | ICD-10-CM

## 2024-10-09 DIAGNOSIS — J209 Acute bronchitis, unspecified: Secondary | ICD-10-CM | POA: Diagnosis not present

## 2024-10-09 MED ORDER — PREDNISONE 20 MG PO TABS: ORAL_TABLET | ORAL | 0 refills | Status: AC

## 2024-10-09 MED ORDER — BENZONATATE 200 MG PO CAPS: 200.0000 mg | ORAL_CAPSULE | Freq: Two times a day (BID) | ORAL | 0 refills | Status: AC | PRN

## 2024-10-09 NOTE — Progress Notes (Signed)
 OFFICE VISIT  10/09/2024  CC:  Chief Complaint  Patient presents with   Cough    Productive, chest congestion, body aches, fatigue, unsure of fever..Denies n/v/d. Symptoms started 8 days ago.    Patient is a 79 y.o. female who presents for cough.  HPI: Onset 8 days ago, nasal congestion/sinus pressure, postnasal drip, cough. This time is going on the cough has become worse and worse, up to half the night last night. No fever.  She generally has some chronic body aches and fatigue but nothing new with this illness.  Review of systems: No nausea vomiting or diarrhea.  No rash.  No sore throat or headache.  No face pain. She is taking an over-the-counter cold/flu medication and Mucinex .   Past Medical History:  Diagnosis Date   Anxiety    Arthritis    oa   At moderate risk for fall 02/04/2021   Atypical lobular hyperplasia [facility]) of left breast 12/06/2019   Body mass index (BMI) 45.0-49.9, adult (HCC) 03/26/2021   Cardiac murmur 08/06/2020   Chronic cystitis    CKD (chronic kidney disease) stage 3, GFR 30-59 ml/min (HCC) 04/13/2017   Cystic thyroid  nodule 08/09/2018   7x10 mm, right lobe   DDD (degenerative disc disease), cervical    Depression with anxiety 11/04/2007   Current med: xanax  PRN, zoloft  100 mg qd.     Dysphagia 07/28/2018   Essential hypertension 11/04/2007   Current meds: lisinopril /HCTZ 10/12.5 mg qd; tenormin  25 mg qd     Fibromyalgia    Folate deficiency 11/29/2023   Gait instability 01/22/2019   GERD (gastroesophageal reflux disease)    Heart murmur    History of duodenal ulcer    2007   History of gastritis    2007   Hoarseness 08/22/2015   GI- did not feel gerd, ENT did not feel ent, neuro- felt possibly ent--> sent for 2nd opinion ent.    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 This patient is considered osteoporotic according to World Health Organization [facility]) criteria. L-1, L-4 were excluded due to degenerative changes. Per the official positions of the ISCD, it is not possible to quantitatively compare BMD or calculate an [facility] between exams done at different facilities.    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 MRI scheduled. 04/16/2016    Seasonal allergies 02/19/2016   Unspecified venous (peripheral) insufficiency    wears compression hose   Urine incontinence    Vitamin D  deficiency 07/20/2008   Taking 1000 iu qd     Vitamin D  deficiency, unspecified     Past Surgical History:  Procedure Laterality Date   ANTERIOR AND POSTERIOR VAGINAL REPAIR  04/29/2004   AND TRANSVAGINAL TAPE SLING  ANTERIOR CERVICAL DECOMP/DISCECTOMY FUSION  1999   APPENDECTOMY  1984   BILATERAL SALPINGOOPHORECTOMY  1984   Bladder stimulator     BREAST BIOPSY Right 03/2021   BREAST EXCISIONAL BIOPSY Left 10/31/2019   BREAST LUMPECTOMY WITH RADIOACTIVE SEED LOCALIZATION Left 10/31/2019   Procedure: LEFT BREAST LUMPECTOMY WITH RADIOACTIVE SEED LOCALIZATION;  Surgeon: Vanderbilt Ned, MD;  Location: [facility];  Service: General;  Laterality: Left;   CARDIAC CATHETERIZATION  10-03-2002   DR JODINE   NORMAL CORONARIES ARTERIES/  EF 55%   CYSTOSCOPY N/A 08/27/2013   Procedure:  CYSTOSCOPY;  Surgeon: Arlena LILLETTE Gal, MD;  Location: [facility];  Service: Urology;  Laterality: N/A;   CYSTOSCOPY WITH INJECTION N/A 12/17/2013   Procedure: JENNETTA WITH INJECTION;  Surgeon: Arlena LILLETTE Gal, MD;  Location: [facility];  Service: Urology;  Laterality: N/A;   DILATION AND CURETTAGE OF UTERUS     EXCISION RIGHT NECK AND LEFT BREAST SEBACEOUS CYST  05/18/2002   HEMORRHOID BANDING  2014   PUBOVAGINAL SLING N/A 10/25/2016   Procedure: CARLOYN GLADE;  Surgeon: Arlena Gal, MD;  Location: [facility];  Service: Urology;  Laterality: N/A;   right litlle finger surgery  yrs ago   cyst removed x 4   TOTAL ABDOMINAL HYSTERECTOMY  1986   TUBAL LIGATION     VAGINAL PROLAPSE REPAIR N/A 08/27/2013   Procedure: ANTERIOR VAGINAL VAULT SUSPENSION, KELLY PLICATION WITH SACROSPINOUS LIGAMENT FIXATION AND XENFORM BOVINE DERMIS GRAFT AUGMENTATION, URETHRAL EXPLORATION, URETHROLYSIS, EXPLANTATION OF TVT TAPE, IMPLANTATION OF FLOSEAL, IMPLANTATION OF XENFORM BOVINE GRAFT IN PERIURETHRAL SPACE;  Surgeon: Arlena LILLETTE Gal, MD;  Location: [facility];  Service: Urology;  Laterality: N/A   VAGINAL PROLAPSE REPAIR N/A 10/25/2016   Procedure: VAGINAL VAULT SUSPENSION with mesh and sacrospinous repair;  Surgeon: Arlena Gal, MD;  Location: [facility];  Service: Urology;  Laterality: N/A;    Outpatient Medications Prior to Visit  Medication Sig Dispense Refill   atenolol  (TENORMIN ) 25 MG tablet Take 0.5 tablets (12.5 mg total) by mouth daily. 45 tablet 3   CEQUA 0.09 % SOLN Apply 1 drop to eye 2 (two) times daily.     cetirizine (ZYRTEC) 10 MG tablet Take 10 mg by mouth daily as needed for allergies.     cholecalciferol  (VITAMIN D3) 25 MCG (1000 UNIT) tablet Take 1,000 Units by mouth daily.     Cyanocobalamin  (VITAMIN B-12) 1000 MCG SUBL Place 1 tablet under the tongue daily.      DULoxetine  (CYMBALTA ) 60 MG capsule Take 1  capsule (60 mg total) by mouth daily. 90 capsule 1   fluticasone (FLONASE) 50 MCG/ACT nasal spray Place 1 spray into both nostrils as needed for allergies.     folic acid  (FOLVITE ) 1 MG tablet Take 1 tablet (1 mg total) by mouth daily. 90 tablet 3   iron  polysaccharides (NIFEREX) 150 MG capsule TAKE 1 CAPSULE ON MONDAY, WEDNESDAY AND FRIDAY. 36 capsule 3   latanoprost (XALATAN) 0.005 % ophthalmic solution Place 1 drop into the left eye at bedtime.     LORazepam  (ATIVAN ) 0.5 MG tablet Take 1 tablet (0.5 mg total) by mouth every 12 (twelve) hours as needed for anxiety (for panic attack). 180 tablet 1   losartan -hydrochlorothiazide  (HYZAAR) 50-12.5 MG tablet Take 1 tablet by mouth daily. 90 tablet 1   mirtazapine  (REMERON ) 15 MG tablet Take 1 tablet (15 mg total) by mouth at bedtime. 90 tablet 1   Multiple Vitamins-Minerals (WOMENS MULTIVITAMIN PLUS  PO) Take 1 tablet by mouth daily.     Omega-3 Fatty Acids (FISH OIL PO) Take 1 capsule by mouth daily.      omeprazole  (PRILOSEC) 40 MG capsule Take 1 capsule (40 mg total) by mouth 2 (two) times daily before a meal. 30 mins before breakfast and supper 180 capsule 3   oxybutynin  (DITROPAN -XL) 10 MG 24 hr tablet Take 1 tablet (10 mg total) by mouth at bedtime. 90 tablet 1   pravastatin  (PRAVACHOL ) 40 MG tablet Take 1 tablet (40 mg total) by mouth daily. 90 tablet 3   tamoxifen (NOLVADEX) 20 MG tablet Take 20 mg by mouth daily.      traMADol  (ULTRAM ) 50 MG tablet Take 1 tablet (50 mg total) by mouth every 8 (eight) hours. 270 tablet 1   Turmeric Curcumin 500 MG CAPS Take 1,000 mg by mouth daily.      No facility-administered medications prior to visit.    Allergies[1]  Review of Systems  As per HPI  PE:    10/09/2024    8:04 AM 07/24/2024   11:45 AM 07/24/2024   11:36 AM  Vitals with BMI  Height 5' 5    Weight 204 lbs  206 lbs 3 oz  BMI 33.95    Systolic 152 126 875  Diastolic 86 78 80  Pulse 98  89     Physical Exam  VS:  noted--normal. Gen: alert, NAD, NONTOXIC APPEARING. HEENT: eyes without injection, drainage, or swelling.  Ears: EACs clear, TMs with normal light reflex and landmarks.  Nose: Clear rhinorrhea, with some dried, crusty exudate adherent to mildly injected mucosa.  No purulent d/c.  No paranasal sinus TTP.  No facial swelling.  Throat and mouth without focal lesion.  No pharyngial swelling, erythema, or exudate.   Neck: supple, no LAD.   LUNGS: CTA bilat, nonlabored resps.   CV: RRR, no m/r/g. EXT: no c/c/e SKIN: no rash   LABS:  Last CBC Lab Results  Component Value Date   WBC 7.4 04/25/2024   HGB 11.4 (L) 04/25/2024   HCT 36.3 04/25/2024   MCV 88.3 04/25/2024   MCH 29.6 09/09/2020   RDW 14.4 04/25/2024   PLT 293.0 04/25/2024   Last metabolic panel Lab Results  Component Value Date   GLUCOSE 88 07/24/2024   NA 143 07/24/2024   K 3.8 07/24/2024   CL 104 07/24/2024   CO2 31 07/24/2024   BUN 20 07/24/2024   CREATININE 1.19 07/24/2024   GFR 44.15 (L) 07/24/2024   CALCIUM 9.6 07/24/2024   CALCIUM 9.2 07/24/2024   PROT 6.5 07/24/2024   ALBUMIN 4.1 07/24/2024   BILITOT 0.4 07/24/2024   ALKPHOS 50 07/24/2024   AST 13 07/24/2024   ALT 10 07/24/2024   ANIONGAP 10 10/26/2019   IMPRESSION AND PLAN:  Viral URI with bronchitis. Prednisone  40 mg a day x 5 days prescribed today. Tessalon  Perles 200 mg, 1 twice daily as needed. Okay to continue Mucinex  in the morning and preferably use Tessalon  in the late evening. Encouraged to saline nasal spray.  An After Visit Summary was printed and given to the patient.  FOLLOW UP: Return if symptoms worsen or fail to improve.  Signed:  Gerlene Hockey, MD           10/09/2024     [1]  Allergies Allergen Reactions   Meloxicam  Swelling    Caused edema and Cr increase   Morphine Nausea And Vomiting    SEVERE   Sulfa Antibiotics  Swelling

## 2024-11-07 ENCOUNTER — Ambulatory Visit: Payer: Medicare HMO

## 2024-11-07 ENCOUNTER — Ambulatory Visit: Admitting: Emergency Medicine

## 2024-12-12 ENCOUNTER — Ambulatory Visit: Admitting: Emergency Medicine

## 2025-01-09 ENCOUNTER — Ambulatory Visit: Admitting: Family Medicine
